# Patient Record
Sex: Male | Born: 1945
Health system: Southern US, Community
[De-identification: ages and names within clinical notes are randomized; demographics above are authoritative.]

## PROBLEM LIST (undated history)

## (undated) DIAGNOSIS — I429 Cardiomyopathy, unspecified: Secondary | ICD-10-CM

## (undated) DIAGNOSIS — K5790 Diverticulosis of intestine, part unspecified, without perforation or abscess without bleeding: Secondary | ICD-10-CM

## (undated) DIAGNOSIS — E785 Hyperlipidemia, unspecified: Secondary | ICD-10-CM

## (undated) DIAGNOSIS — I2510 Atherosclerotic heart disease of native coronary artery without angina pectoris: Secondary | ICD-10-CM

## (undated) DIAGNOSIS — R9431 Abnormal electrocardiogram [ECG] [EKG]: Secondary | ICD-10-CM

## (undated) DIAGNOSIS — I351 Nonrheumatic aortic (valve) insufficiency: Secondary | ICD-10-CM

## (undated) DIAGNOSIS — I5042 Chronic combined systolic (congestive) and diastolic (congestive) heart failure: Secondary | ICD-10-CM

## (undated) DIAGNOSIS — I77810 Thoracic aortic ectasia: Secondary | ICD-10-CM

## (undated) DIAGNOSIS — F17200 Nicotine dependence, unspecified, uncomplicated: Secondary | ICD-10-CM

## (undated) DIAGNOSIS — I428 Other cardiomyopathies: Secondary | ICD-10-CM

## (undated) DIAGNOSIS — E041 Nontoxic single thyroid nodule: Secondary | ICD-10-CM

## (undated) DIAGNOSIS — L989 Disorder of the skin and subcutaneous tissue, unspecified: Secondary | ICD-10-CM

## (undated) DIAGNOSIS — R911 Solitary pulmonary nodule: Secondary | ICD-10-CM

## (undated) DIAGNOSIS — J40 Bronchitis, not specified as acute or chronic: Secondary | ICD-10-CM

## (undated) DIAGNOSIS — F1090 Alcohol use, unspecified, uncomplicated: Secondary | ICD-10-CM

## (undated) DIAGNOSIS — N529 Male erectile dysfunction, unspecified: Secondary | ICD-10-CM

## (undated) DIAGNOSIS — E66811 Obesity, class 1: Secondary | ICD-10-CM

## (undated) DIAGNOSIS — F411 Generalized anxiety disorder: Secondary | ICD-10-CM

## (undated) DIAGNOSIS — H6692 Otitis media, unspecified, left ear: Secondary | ICD-10-CM

## (undated) DIAGNOSIS — M109 Gout, unspecified: Secondary | ICD-10-CM

## (undated) DIAGNOSIS — S39012A Strain of muscle, fascia and tendon of lower back, initial encounter: Secondary | ICD-10-CM

## (undated) DIAGNOSIS — H7292 Unspecified perforation of tympanic membrane, left ear: Secondary | ICD-10-CM

## (undated) DIAGNOSIS — E669 Obesity, unspecified: Secondary | ICD-10-CM

## (undated) DIAGNOSIS — G3184 Mild cognitive impairment, so stated: Secondary | ICD-10-CM

## (undated) DIAGNOSIS — R05 Cough: Secondary | ICD-10-CM

## (undated) DIAGNOSIS — M1990 Unspecified osteoarthritis, unspecified site: Secondary | ICD-10-CM

## (undated) DIAGNOSIS — I447 Left bundle-branch block, unspecified: Secondary | ICD-10-CM

## (undated) DIAGNOSIS — J069 Acute upper respiratory infection, unspecified: Secondary | ICD-10-CM

## (undated) DIAGNOSIS — I10 Essential (primary) hypertension: Secondary | ICD-10-CM

## (undated) DIAGNOSIS — Z9889 Other specified postprocedural states: Secondary | ICD-10-CM

## (undated) DIAGNOSIS — Z7289 Other problems related to lifestyle: Secondary | ICD-10-CM

## (undated) DIAGNOSIS — R011 Cardiac murmur, unspecified: Secondary | ICD-10-CM

## (undated) DIAGNOSIS — I350 Nonrheumatic aortic (valve) stenosis: Secondary | ICD-10-CM

## (undated) DIAGNOSIS — I771 Stricture of artery: Secondary | ICD-10-CM

## (undated) HISTORY — DX: Other specified postprocedural states: Z98.890

## (undated) HISTORY — DX: Solitary pulmonary nodule: R91.1

## (undated) HISTORY — PX: POLYPECTOMY: SHX149

## (undated) HISTORY — DX: Obesity, unspecified: E66.9

## (undated) HISTORY — DX: Hyperlipidemia, unspecified: E78.5

## (undated) HISTORY — DX: Other cardiomyopathies: I42.8

## (undated) HISTORY — DX: Alcohol use, unspecified, uncomplicated: F10.90

## (undated) HISTORY — DX: Nicotine dependence, unspecified, uncomplicated: F17.200

## (undated) HISTORY — DX: Unspecified perforation of tympanic membrane, left ear: H72.92

## (undated) HISTORY — DX: Diverticulosis of intestine, part unspecified, without perforation or abscess without bleeding: K57.90

## (undated) HISTORY — DX: Left bundle-branch block, unspecified: I44.7

## (undated) HISTORY — DX: Abnormal electrocardiogram (ECG) (EKG): R94.31

## (undated) HISTORY — DX: Strain of muscle, fascia and tendon of lower back, initial encounter: S39.012A

## (undated) HISTORY — DX: Cardiac murmur, unspecified: R01.1

## (undated) HISTORY — DX: Essential (primary) hypertension: I10

## (undated) HISTORY — DX: Acute upper respiratory infection, unspecified: J06.9

## (undated) HISTORY — DX: Stricture of artery: I77.1

## (undated) HISTORY — DX: Bronchitis, not specified as acute or chronic: J40

## (undated) HISTORY — DX: Unspecified osteoarthritis, unspecified site: M19.90

## (undated) HISTORY — DX: Generalized anxiety disorder: F41.1

## (undated) HISTORY — DX: Atherosclerotic heart disease of native coronary artery without angina pectoris: I25.10

## (undated) HISTORY — PX: TRANSTHORACIC ECHOCARDIOGRAM: SHX275

## (undated) HISTORY — DX: Disorder of the skin and subcutaneous tissue, unspecified: L98.9

## (undated) HISTORY — PX: COLONOSCOPY: SHX174

## (undated) HISTORY — DX: Chronic combined systolic (congestive) and diastolic (congestive) heart failure: I50.42

## (undated) HISTORY — DX: Thoracic aortic ectasia: I77.810

## (undated) HISTORY — DX: Male erectile dysfunction, unspecified: N52.9

## (undated) HISTORY — DX: Nonrheumatic aortic (valve) stenosis: I35.0

## (undated) HISTORY — DX: Gout, unspecified: M10.9

## (undated) HISTORY — DX: Otitis media, unspecified, left ear: H66.92

## (undated) HISTORY — DX: Obesity, class 1: E66.811

## (undated) HISTORY — DX: Nontoxic single thyroid nodule: E04.1

## (undated) HISTORY — DX: Cough: R05

## (undated) HISTORY — DX: Mild cognitive impairment of uncertain or unknown etiology: G31.84

## (undated) HISTORY — DX: Nonrheumatic aortic (valve) insufficiency: I35.1

## (undated) HISTORY — DX: Other problems related to lifestyle: Z72.89

---
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
## 1997-05-19 ENCOUNTER — Encounter: Payer: Self-pay | Admitting: Family Medicine

## 2002-01-29 ENCOUNTER — Encounter: Payer: Self-pay | Admitting: Family Medicine

## 2002-08-30 ENCOUNTER — Encounter: Payer: Self-pay | Admitting: Family Medicine

## 2002-08-30 LAB — HM COLONOSCOPY

## 2002-10-08 ENCOUNTER — Encounter: Payer: Self-pay | Admitting: Family Medicine

## 2007-10-19 ENCOUNTER — Encounter: Payer: Self-pay | Admitting: Family Medicine

## 2008-02-26 ENCOUNTER — Encounter (INDEPENDENT_AMBULATORY_CARE_PROVIDER_SITE_OTHER): Payer: Self-pay | Admitting: *Deleted

## 2008-02-26 ENCOUNTER — Encounter: Payer: Self-pay | Admitting: Family Medicine

## 2008-02-26 LAB — CONVERTED CEMR LAB
AST: 24 units/L
CO2: 25 meq/L
Calcium: 9.4 mg/dL
Glucose, Bld: 91 mg/dL
Potassium: 4.3 meq/L
Sodium: 139 meq/L

## 2008-05-10 ENCOUNTER — Encounter: Payer: Self-pay | Admitting: Family Medicine

## 2008-05-10 ENCOUNTER — Encounter (INDEPENDENT_AMBULATORY_CARE_PROVIDER_SITE_OTHER): Payer: Self-pay | Admitting: *Deleted

## 2008-05-10 LAB — CONVERTED CEMR LAB
ALT: 36 units/L
AST: 24 units/L
Alkaline Phosphatase: 82 units/L
Cholesterol: 161 mg/dL
HDL: 39 mg/dL
LDL (calc): 101 mg/dL
Total Bilirubin: 0.8 mg/dL
Triglyceride fasting, serum: 105 mg/dL

## 2009-05-10 ENCOUNTER — Encounter: Payer: Self-pay | Admitting: Family Medicine

## 2010-06-12 ENCOUNTER — Telehealth (INDEPENDENT_AMBULATORY_CARE_PROVIDER_SITE_OTHER): Payer: Self-pay | Admitting: *Deleted

## 2010-06-14 ENCOUNTER — Ambulatory Visit: Payer: Self-pay | Admitting: Family Medicine

## 2010-06-14 DIAGNOSIS — F411 Generalized anxiety disorder: Secondary | ICD-10-CM

## 2010-06-27 ENCOUNTER — Encounter (INDEPENDENT_AMBULATORY_CARE_PROVIDER_SITE_OTHER): Payer: Self-pay | Admitting: *Deleted

## 2010-06-27 ENCOUNTER — Encounter: Payer: Self-pay | Admitting: Family Medicine

## 2010-07-19 ENCOUNTER — Ambulatory Visit
Admission: RE | Admit: 2010-07-19 | Discharge: 2010-07-19 | Payer: Self-pay | Source: Home / Self Care | Attending: Family Medicine | Admitting: Family Medicine

## 2010-07-19 ENCOUNTER — Other Ambulatory Visit: Payer: Self-pay | Admitting: Family Medicine

## 2010-07-19 ENCOUNTER — Encounter: Payer: Self-pay | Admitting: Family Medicine

## 2010-07-19 DIAGNOSIS — I10 Essential (primary) hypertension: Secondary | ICD-10-CM

## 2010-07-19 HISTORY — DX: Essential (primary) hypertension: I10

## 2010-07-20 ENCOUNTER — Ambulatory Visit: Admit: 2010-07-20 | Payer: Self-pay | Admitting: Family Medicine

## 2010-07-20 LAB — LIPID PANEL
Cholesterol: 182 mg/dL (ref 0–200)
HDL: 60.9 mg/dL (ref >39.00)
LDL Cholesterol: 107 mg/dL — ABNORMAL HIGH (ref 0–99)
Total CHOL/HDL Ratio: 3
Triglycerides: 71 mg/dL (ref 0.0–149.0)
VLDL: 14.2 mg/dL (ref 0.0–40.0)

## 2010-07-20 LAB — CBC WITH DIFFERENTIAL/PLATELET
Basophils Absolute: 0 10*3/uL (ref 0.0–0.1)
Basophils Relative: 0.8 % (ref 0.0–3.0)
Eosinophils Absolute: 0.2 10*3/uL (ref 0.0–0.7)
Eosinophils Relative: 5.8 % — ABNORMAL HIGH (ref 0.0–5.0)
HCT: 44.6 % (ref 39.0–52.0)
Hemoglobin: 15.5 g/dL (ref 13.0–17.0)
Lymphocytes Relative: 35.2 % (ref 12.0–46.0)
Lymphs Abs: 1.2 10*3/uL (ref 0.7–4.0)
MCHC: 34.7 g/dL (ref 30.0–36.0)
MCV: 93.2 fl (ref 78.0–100.0)
Monocytes Absolute: 0.3 10*3/uL (ref 0.1–1.0)
Monocytes Relative: 8.6 % (ref 3.0–12.0)
Neutro Abs: 1.7 10*3/uL (ref 1.4–7.7)
Neutrophils Relative %: 49.6 % (ref 43.0–77.0)
Platelets: 175 10*3/uL (ref 150.0–400.0)
RBC: 4.79 Mil/uL (ref 4.22–5.81)
RDW: 13.7 % (ref 11.5–14.6)
WBC: 3.5 10*3/uL — ABNORMAL LOW (ref 4.5–10.5)

## 2010-07-20 LAB — HEPATIC FUNCTION PANEL
ALT: 26 U/L (ref 0–53)
AST: 24 U/L (ref 0–37)
Albumin: 3.9 g/dL (ref 3.5–5.2)
Alkaline Phosphatase: 62 U/L (ref 39–117)
Bilirubin, Direct: 0.2 mg/dL (ref 0.0–0.3)
Total Bilirubin: 1.2 mg/dL (ref 0.3–1.2)
Total Protein: 6.6 g/dL (ref 6.0–8.3)

## 2010-07-20 LAB — BASIC METABOLIC PANEL
BUN: 12 mg/dL (ref 6–23)
CO2: 30 mEq/L (ref 19–32)
Calcium: 9.3 mg/dL (ref 8.4–10.5)
Chloride: 104 mEq/L (ref 96–112)
Creatinine, Ser: 0.9 mg/dL (ref 0.4–1.5)
GFR: 92.7 mL/min (ref >60.00)
Glucose, Bld: 94 mg/dL (ref 70–99)
Potassium: 4.6 mEq/L (ref 3.5–5.1)
Sodium: 141 mEq/L (ref 135–145)

## 2010-07-20 LAB — PSA: PSA: 0.95 ng/mL (ref 0.10–4.00)

## 2010-07-20 LAB — TSH: TSH: 2.32 u[IU]/mL (ref 0.35–5.50)

## 2010-07-23 ENCOUNTER — Telehealth (INDEPENDENT_AMBULATORY_CARE_PROVIDER_SITE_OTHER): Payer: Self-pay | Admitting: *Deleted

## 2010-07-27 ENCOUNTER — Encounter: Payer: Self-pay | Admitting: Family Medicine

## 2010-08-02 ENCOUNTER — Ambulatory Visit
Admission: RE | Admit: 2010-08-02 | Discharge: 2010-08-02 | Payer: Self-pay | Source: Home / Self Care | Attending: Family Medicine | Admitting: Family Medicine

## 2010-08-13 ENCOUNTER — Ambulatory Visit
Admission: RE | Admit: 2010-08-13 | Discharge: 2010-08-13 | Payer: Self-pay | Source: Home / Self Care | Attending: Internal Medicine | Admitting: Internal Medicine

## 2010-08-13 DIAGNOSIS — R011 Cardiac murmur, unspecified: Secondary | ICD-10-CM

## 2010-08-13 DIAGNOSIS — F17200 Nicotine dependence, unspecified, uncomplicated: Secondary | ICD-10-CM

## 2010-08-13 HISTORY — DX: Cardiac murmur, unspecified: R01.1

## 2010-08-13 HISTORY — DX: Nicotine dependence, unspecified, uncomplicated: F17.200

## 2010-08-14 NOTE — Progress Notes (Signed)
Summary: Patient questions  Phone Note Call from Patient Call back at Home Phone [phone]   Caller: Spouse Summary of Call: Pls contact pt's wife, she has questions about her husband's care, she would like to know what will be done in his first office visit, I did advise pt that the cost of a new patient visit is normally $280 but that this is only an estimate and that I am not sure what [employer]BS covers Initial call taken by: Lannette Donath,  June 12, 2010 11:53 AM  Follow-up for Phone Call        Spoke with pts wife and she would like to know what all is included in your new pts appts? Pt is concerned about how your going to code this for his insurance? Follow-up by: Josph Macho RMA,  June 12, 2010 12:12 PM  Additional Follow-up for Phone Call Additional follow up Details #1::        This all depends on the patient and their medical problems, complaints, etc.  Tell them that it is not possible for me to predict what I'm going to code for for any given visit b/c it all depends on what the patient's needs are at that visit.  So, if she can give a little info about what the visit is for (for example, does he have a cough, or shoulder pain, or some other problem that needs attention, or is he coming in for a "complete physical"), then I can possibly give them a little better idea of what to expect at the visit, coding/billing, etc.    Additional Follow-up by: Michell Heinrich M.D.,  June 12, 2010 3:24 PM    Additional Follow-up for Phone Call Additional follow up Details #2::    Pts spouse states she understands but was just trying to get an idea of pricing. Follow-up by: Josph Macho RMA,  June 12, 2010 4:06 PM

## 2010-08-14 NOTE — Assessment & Plan Note (Signed)
Summary: New pt est/dt BCBS   Vital Signs:  Patient profile:   65 year old male Height:      65 inches (165.10 cm) Weight:      187.25 pounds (85.11 kg) BMI:     31.27 O2 Sat:      97 % on Room air Temp:     98.0 degrees F (36.67 degrees C) oral Pulse rate:   67 / minute BP sitting:   174 / 83  (right arm) Cuff size:   regular  Vitals Entered By: Josph Macho RMA (June 14, 2010 9:28 AM)  O2 Flow:  Room air CC: Establish new patient/ CF Is Patient Diabetic? No   History of Present Illness: 65 year old white male who is here today to consider being a patient here. He currently sees Dr. Christell Constant at [facility] in [place] and wants to find a younger doctor in anticipation of Dr. Kathi Der eventual retirement.   Has not been to MD in > 80yrs per his report.  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  Takes prozac 10mg  once daily for the last 15 + years "to calm me down".  Denies excessive worry, stress, or history of depression.   Says BP has been up at MD visits and when he give blood at [facility] in the past, but also has normal bp readings sometimes in these settings.  Has never taken BP out of these settings, and has never been dx'd with HTN or given BP med.  Takes no OTC meds regularly, none at all recently. Does not get regular physical exercise.  No cigarettes.  Smoked cigars until 1-2 years ago.  Still chews tobacco.  No alcohol or illegal drugs.  No excessive caffeine. Has never had any surgeries.  Had a colonoscopy > 10 yrs ago, normal per his report today.    Preventive Screening-Counseling & Management  Caffeine-Diet-Exercise     Does Patient Exercise: no      Drug Use:  no.    Current Medications (verified): 1)  Prozac 10 Mg Caps (Fluoxetine Hcl) .... Once Daily  Allergies (verified): 1)  ! Penicillin  Past History:  Past Medical History: Elevated BP without dx of HTN Anxiety NOS Tobacco dependence (chewing)  Family History: NO HTN or  CAD.  Social History: Married, 2 grown children, works as [employer] fast streamer. Alcohol use-no Drug use-no Regular exercise-no Drug Use:  no Does Patient Exercise:  no  Review of Systems  The patient denies anorexia, fever, weight loss, weight gain, vision loss, decreased hearing, hoarseness, chest pain, syncope, dyspnea on exertion, peripheral edema, prolonged cough, headaches, hemoptysis, abdominal pain, melena, hematochezia, severe indigestion/heartburn, hematuria, incontinence, genital sores, muscle weakness, suspicious skin lesions, transient blindness, difficulty walking, depression, unusual weight change, abnormal bleeding, enlarged lymph nodes, angioedema, breast masses, and testicular masses.    Physical Exam  General:  VS: noted, BP repeated --R arm 140/80, L arm 144/84 (by me). Gen: Alert, well appearing, oriented x 4. Neck: supple.  No lymphadenopathy, thyromegaly, or mass.  Carotid pulses 2+ bilat, no bruits. Chest: symmetric expansion, with nonlabored respirations.  Clear and equal breath sounds in all lung fields.   CV: RRR, 1/6 systolic murmur at base.  S1 and S2 distinct.  Peripheral pulses 2+/symmetric. ABD: soft, NT, ND, BS normal.  No hepatospenomegaly or mass.  No bruits. EXT: no clubbing, cyanosis, or edema.     Impression & Recommendations:  Problem # 1:  ELEVATED BP READING WITHOUT DX HYPERTENSION (  ICD-796.2) New patient, here to consider establishing care with me, which I think he has decided to do. I recommended we follow his BP out of the office---I offered to give him a BP cuff that was donated by a pharmaceutical company, but he declined at this time.  He agreed to try to make an effort to get BP checked at [facility] or a pharmacy and write it down and bring it to return appt.  I'll gather old records (including an EKG he says he had about 2 yrs ago or so) and decide at f/u if repeat needed. At f/u, will do fasting lipids, PSA, CMET.  I did recommend he get a  repeat colonoscopy as per screening guidelines---he'll call his GI MD's office or call us if our referral is needed. He declined my offer of flu vaccine today.  I did recommend he start taking one 81mg  ASA daily and try to lower his sodium intake and increase intake of fruits and vegetables, increase physical activity. F/u 59mo.  Complete Medication List: 1)  Prozac 10 Mg Caps (Fluoxetine hcl) .... Once daily  Patient Instructions: 1)  Please schedule a follow-up appointment in 1 month.  2)  Check your blood pressure at [facility] or a pharmacy whevever possible and write this number down.    Orders Added: 1)  New Patient Level III [99203]    Preventive Care Screening  Last Tetanus Booster:    Date:  07/16/2007    Results:  Historical    Appended Document: New pt est/dt BCBS Faxed medical record request 12.1.11 Diane Tomerlin

## 2010-08-16 ENCOUNTER — Ambulatory Visit: Admit: 2010-08-16 | Payer: Self-pay | Admitting: Family Medicine

## 2010-08-16 ENCOUNTER — Encounter: Payer: Self-pay | Admitting: Family Medicine

## 2010-08-16 ENCOUNTER — Ambulatory Visit: Payer: BC Managed Care – PPO | Admitting: Family Medicine

## 2010-08-16 DIAGNOSIS — I10 Essential (primary) hypertension: Secondary | ICD-10-CM

## 2010-08-16 NOTE — Assessment & Plan Note (Signed)
Summary: 1 month fu/dt   Vital Signs:  Patient profile:   65 year old male Height:      65 inches Weight:      187.75 pounds BMI:     31.36 Pulse rate:   61 / minute Pulse rhythm:   regular BP sitting:   171 / 82  (right arm) Cuff size:   regular  Vitals Entered By: Francee Piccolo CMA Duncan Dull) (July 19, 2010 9:16 AM) CC: BP Check/no other complaints//SP Is Patient Diabetic? No   History of Present Illness:  Hypertension follow-up--he has not checked his BP since our last visit.  Feeling good.      This is a 65 year old man who presents for Hypertension follow-up.  The patient denies lightheadedness, urinary frequency, headaches, edema, impotence, rash, and fatigue.  The patient denies the following associated symptoms: chest pain, chest pressure, dyspnea, palpitations, and leg edema.  The patient reports that dietary compliance has been good.  Adjunctive measures currently used by the patient include salt restriction.   At this time is not on medications yet.  Current Medications (verified): 1)  Prozac 10 Mg Caps (Fluoxetine Hcl) .... Once Daily  Allergies (verified): 1)  ! Penicillin  Past History:  Past medical, surgical, family and social histories (including risk factors) reviewed, and no changes noted (except as noted below).  Past Medical History: Reviewed history from 06/27/2010 and no changes required. Elevated BP without dx of HTN Anxiety NOS Tobacco dependence (chewing) Diverticulosis (TCS 2004-Dr. Arlyce Dice) ETT 2004 [facility]--neg L-spine osteoarthritis  Family History: Reviewed history from 06/27/2010 and no changes required. Mother: Alzheimer's dementia Father: COPD, CHF Brother: congenital heart disease/cardiolyopathy--d. age 51  Social History: Reviewed history from 06/14/2010 and no changes required. 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 Alcohol use-no Drug use-no Regular exercise-no  Review of Systems       see HPI.  Also: no  urinary frequency, straining, incomplete emptying, dribbling, or nocturia.  Physical Exam  General:  VS: noted, hypertensive, HR low 60s. Gen: Alert, well appearing, oriented x 4. Chest: symmetric expansion, with nonlabored respirations.  Clear and equal breath sounds in all lung fields.   CV: RRR, distant S1 and S2, question of soft systolic murmur heard best at apex.  Peripheral pulses 2+/symmetric. EXT: no clubbing, cyanosis, or edema.  DRE: normal rectal tone, no mass.  Stool wipings hemoccult negative today. Prostate size normal, without palpable nodule or induration.  No tenderness.   Impression & Recommendations:  Problem # 1:  ESSENTIAL HYPERTENSION (ICD-401.9) Assessment New Start HCTZ 25mg  once daily.  Continue low sodium diet. Check lipids, CMET, TSH, CBC.   His updated medication list for this problem includes:    Hydrochlorothiazide 25 Mg Tabs (Hydrochlorothiazide) .Marland Kitchen... 1 tab by mouth qd  Orders: EKG w/ Interpretation (93000) TLB-Lipid Panel (80061-LIPID) TLB-BMP (Basic Metabolic Panel-BMET) (80048-METABOL) TLB-Hepatic/Liver Function Pnl (80076-HEPATIC) TLB-CBC Platelet - w/Differential (85025-CBCD) TLB-TSH (Thyroid Stimulating Hormone) (82956-OZH) Cardiology Referral (Cardiology)  Problem # 2:  ABNORMAL ELECTROCARDIOGRAM (ICD-794.31) Assessment: New  EKG changes reflect LVH and possible anterolateral ischemia. Needs further cardiac evaluation, will refer ASAP. Risks vs benefits of starting aspirin was rediscussed today with pt, and I will have him start ASA 81mg  once daily.  Orders: Cardiology Referral (Cardiology)  Problem # 3:  SPECIAL SCREENING MALIGNANT NEOPLASM OF PROSTATE (ICD-V76.44) Assessment: New DRE normal today.   Orders: TLB-PSA (Prostate Specific Antigen) (84153-PSA)  Complete Medication List: 1)  Prozac 10 Mg Caps (Fluoxetine hcl) .... Once daily  2)  Hydrochlorothiazide 25 Mg Tabs (Hydrochlorothiazide) .Marland Kitchen.. 1 tab by mouth qd  Patient  Instructions: 1)  Please schedule a follow-up appointment in 2 weeks. 2)  Take lab orders to [facility] on [address]. Prescriptions: HYDROCHLOROTHIAZIDE 25 MG TABS (HYDROCHLOROTHIAZIDE) 1 tab by mouth qd  #30 x 6   Entered and Authorized by:   Michell Heinrich M.D.   Signed by:   Michell Heinrich M.D. on 07/19/2010   Method used:   Print then Give to Patient   RxID:   [id number]    Orders Added: 1)  EKG w/ Interpretation [93000] 2)  TLB-Lipid Panel [80061-LIPID] 3)  TLB-BMP (Basic Metabolic Panel-BMET) [80048-METABOL] 4)  TLB-Hepatic/Liver Function Pnl [80076-HEPATIC] 5)  TLB-CBC Platelet - w/Differential [85025-CBCD] 6)  TLB-TSH (Thyroid Stimulating Hormone) [84443-TSH] 7)  Est. Patient Level III [95284] 8)  Cardiology Referral [Cardiology] 9)  TLB-PSA (Prostate Specific Antigen) [13244-WNU]

## 2010-08-16 NOTE — Miscellaneous (Signed)
Summary: Lab results  Clinical Lists Changes  Observations: Added new observation of BILI TOTAL: 0.8 mg/dL (16/04/9603 54:09) Added new observation of SGOT (AST): 24 units/L (05/10/2008 13:47) Added new observation of SGPT (ALT): 36 units/L (05/10/2008 13:47) Added new observation of ALK PHOS: 82 units/L (05/10/2008 13:47) Added new observation of TRIGLYCERIDE: 105 mg/dL (81/19/1478 29:56) Added new observation of HDL: 39 mg/dL (21/30/8657 84:69) Added new observation of LDL (CALCUL): 101 mg/dL (62/95/2841 32:44) Added new observation of CHOLESTEROL: 161 mg/dL (07/17/7251 66:44) Added new observation of ALBUMIN: 4.6 g/dL (03/47/4259 56:38) Added new observation of PROTEIN, TOT: 6.8 g/dL (75/64/3329 51:88) Added new observation of BILI TOTAL: 0.7 mg/dL (41/66/0630 16:01) Added new observation of SGPT (ALT): 24 units/L (02/26/2008 13:50) Added new observation of SGOT (AST): 24 units/L (02/26/2008 13:50) Added new observation of CALCIUM: 9.4 mg/dL (09/32/3557 32:20) Added new observation of CO2 PLSM/SER: 25 meq/L (02/26/2008 13:49) Added new observation of CL SERUM: 99 meq/L (02/26/2008 13:49) Added new observation of K SERUM: 4.3 meq/L (02/26/2008 13:49) Added new observation of NA: 139 meq/L (02/26/2008 13:49) Added new observation of CREATININE: 0.80 mg/dL (25/42/7062 37:62) Added new observation of BUN: 13 mg/dL (83/15/1761 60:73) Added new observation of BG RANDOM: 91 mg/dL (71/12/2692 85:46) Added new observation of COLONOSCOPY: Colonoscopy: Diverticulosis  (08/30/2002 13:51)      Colonoscopy  Procedure date:  08/30/2002  Findings:      Colonoscopy: Diverticulosis  -  Date:  05/10/2008    Cholesterol: 161    LDL-calculated: 101    HDL: 39    Triglycerides: 270    Alk Phos: 82    SGPT (ALT): 36    SGOT (AST): 24    Bilirubin-total: 0.8    T. Bilirubin: 0.8  Date:  02/26/2008    SGPT (ALT): 24    SGOT (AST): 24    Bilirubin-total: 0.7    BG Random: 91    BUN:  13    Creatinine: 0.80    Sodium: 139    Potassium: 4.3    Chloride: 99    CO2 Total: 25    Calcium: 9.4    T. Bilirubin: 0.7    Total Protein: 6.8    Albumin: 4.6

## 2010-08-16 NOTE — Procedures (Signed)
Summary: Colon/[facility]  Colon/[facility]   Imported By: Lester O'Donnell 07/02/2010 11:39:39  _____________________________________________________________________  External Attachment:    Type:   Image     Comment:   External Document

## 2010-08-16 NOTE — Assessment & Plan Note (Signed)
Summary: 2 week fu/dt   Vital Signs:  Patient profile:   65 year old male Height:      65 inches Weight:      188.50 pounds BMI:     31.48 Pulse rate:   57 / minute BP sitting:   169 / 81  (right arm) Cuff size:   regular  Vitals Entered By: Francee Piccolo CMA Duncan Dull) (August 02, 2010 9:31 AM) CC: BP check, pt is taking meds and has decreased dietary sodium, pt is not checking BP at home Is Patient Diabetic? No   History of Present Illness:  Hypertension follow-up      This is a 65 year old man who presents for Hypertension follow-up.  He started HCTZ 25mg  qAM, denies any side effects.  No BPs to report---doesn't have home bp cuff yet.  The patient denies lightheadedness, urinary frequency, headaches, edema, impotence, rash, and fatigue.  Compliance with medications (by patient report) has been near 100%.  Adjunctive measures currently used by the patient include salt restriction.   He has cardiology consult appt set for 08/13/10.  Current Medications (verified): 1)  Prozac 10 Mg Caps (Fluoxetine Hcl) .... Once Daily 2)  Hydrochlorothiazide 25 Mg Tabs (Hydrochlorothiazide) .Marland Kitchen.. 1 Tab By Mouth Qd  Allergies (verified): 1)  ! Penicillin  Past History:  Past medical, surgical, family and social histories (including risk factors) reviewed, and no changes noted (except as noted below).  Past Medical History: Reviewed history from 06/27/2010 and no changes required. Elevated BP without dx of HTN Anxiety NOS Tobacco dependence (chewing) Diverticulosis (TCS 2004-Dr. Arlyce Dice) ETT 2004 [facility]--neg L-spine osteoarthritis  Family History: Reviewed history from 06/27/2010 and no changes required. Mother: Alzheimer's dementia Father: COPD, CHF Brother: congenital heart disease/cardiolyopathy--d. age 70  Social History: Reviewed history from 06/14/2010 and no changes required. 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 Alcohol use-no Drug use-no Regular  exercise-no  Physical Exam  General:  VS: noted: bp 169/81P 57 Gen: Alert, well appearing, oriented x 4. No further exam.   Impression & Recommendations:  Problem # 1:  ESSENTIAL HYPERTENSION (ICD-401.9) Assessment Unchanged Continue hctz and add lisinopril generic 20mg , 1/2 tab once daily to start, go to whole once daily if bp not at goal in 2 wks---f/u 2 wks. No asa yet--will start this when bp in better control. We reviewed all his labs from earlier this month in detail today: all normal.  His updated medication list for this problem includes:    Hydrochlorothiazide 25 Mg Tabs (Hydrochlorothiazide) .Marland Kitchen... 1 tab by mouth qd    Lisinopril 20 Mg Tabs (Lisinopril) .Marland Kitchen... 1/2-1 tab by mouth once daily  Complete Medication List: 1)  Prozac 10 Mg Caps (Fluoxetine hcl) .... Once daily 2)  Hydrochlorothiazide 25 Mg Tabs (Hydrochlorothiazide) .Marland Kitchen.. 1 tab by mouth qd 3)  Lisinopril 20 Mg Tabs (Lisinopril) .... 1/2-1 tab by mouth once daily  Patient Instructions: 1)  Follow up in 2 wks. 2)  Consider getting a home BP cuff.  Your goal BP is <130/80. Prescriptions: LISINOPRIL 20 MG TABS (LISINOPRIL) 1/2-1 tab by mouth once daily  #30 x 1   Entered and Authorized by:   Michell Heinrich M.D.   Signed by:   Michell Heinrich M.D. on 08/02/2010   Method used:   Print then Give to Patient   RxID:   [id number]    Orders Added: 1)  Est. Patient Level III [10272]

## 2010-08-16 NOTE — Progress Notes (Signed)
Summary: Lab results  Phone Note Other Incoming   Summary of Call: Please notify: all blood work is normal.   Initial call taken by: Michell Heinrich M.D.,  July 23, 2010 10:13 AM  Follow-up for Phone Call        message left on home answering machine with above information. Follow-up by: Francee Piccolo CMA Duncan Dull),  July 23, 2010 4:16 PM

## 2010-08-16 NOTE — Miscellaneous (Signed)
  Clinical Lists Changes  Observations: Added new observation of CHD 65YR RSK: Not enough information (06/27/2010 8:17) Added new observation of CARD RSK GRP: B (06/27/2010 8:17) Added new observation of HPI: Old records received and reviewed, chart updated today. Of note, he did have some isolated systolic bp readings elevated intermittently over the last decade (150s-160s), and an EKG in 1998 that showed anteroseptal infarct and minimal voltage criteria for LVH. Also, in 1998, he had a CXR that showed mild hilar prominence, and f/u CT was normal at that time. (06/27/2010 8:17) Added new observation of PAST MED HX: Elevated BP without dx of HTN Anxiety NOS Tobacco dependence (chewing) Diverticulosis (TCS 2004-Dr. Arlyce Dice) ETT 2004 [facility]--neg L-spine osteoarthritis (06/27/2010 8:17) Added new observation of FAMILY HX: Mother: Alzheimer's dementia Father: COPD, CHF Brother: congenital heart disease/cardiolyopathy--d. age 76 (06/27/2010 8:17)      History of Present Illness: Old records received and reviewed, chart updated today. Of note, he did have some isolated systolic bp readings elevated intermittently over the last decade (150s-160s), and an EKG in 1998 that showed anteroseptal infarct and minimal voltage criteria for LVH. Also, in 1998, he had a CXR that showed mild hilar prominence, and f/u CT was normal at that time.   Past History:  Past Medical History: Elevated BP without dx of HTN Anxiety NOS Tobacco dependence (chewing) Diverticulosis (TCS 2004-Dr. Arlyce Dice) ETT 2004 [facility]--neg L-spine osteoarthritis   History of Present Illness: Old records received and reviewed, chart updated today. Of note, he did have some isolated systolic bp readings elevated intermittently over the last decade (150s-160s), and an EKG in 1998 that showed anteroseptal infarct and minimal voltage criteria for LVH. Also, in 1998, he had a CXR that showed mild hilar prominence, and f/u CT was  normal at that time.    Family History: Mother: Alzheimer's dementia Father: COPD, CHF Brother: congenital heart disease/cardiolyopathy--d. age 36

## 2010-08-22 ENCOUNTER — Other Ambulatory Visit (HOSPITAL_COMMUNITY): Payer: BC Managed Care – PPO

## 2010-08-22 NOTE — Assessment & Plan Note (Addendum)
Summary: np6/abnormal ekg-mb   CC:  referal from Dr. Milinda Cave abnormal EKG.  History of Present Illness: patient is a 65 year old who was referred for evaluation of abnormal EKG The patient hs no known history of CAD.  He had a stress test in 2004 that was negative for ischemia.  He also has a history of hypertension The patient denies chest pains.  No SOB.  He is fairly active.   Current Medications (verified): 1)  Prozac 10 Mg Caps (Fluoxetine Hcl) .... Once Daily 2)  Hydrochlorothiazide 25 Mg Tabs (Hydrochlorothiazide) .Marland Kitchen.. 1 Tab By Mouth Qd 3)  Lisinopril 20 Mg Tabs (Lisinopril) .... 1/2-1 Tab By Mouth Once Daily  Allergies: 1)  ! Penicillin  Past History:  Family History: Last updated: 06/27/2010 Mother: Alzheimer's dementia Father: COPD, CHF Brother: congenital heart disease/cardiolyopathy--d. 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 Alcohol use-no Drug use-no Regular exercise-no Chews tobacco.  Past Medical History:  HTN Anxiety NOS Tobacco dependence (chewing) Diverticulosis (TCS 2004-Dr. 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 Alcohol use-no Drug use-no Regular exercise-no Chews tobacco.  Review of Systems       All systems reviewed.  Neg to the above problem except as noted above.  Vital Signs:  Patient profile:   65 year old male Height:      65 inches Weight:      185 pounds BMI:     30.90 Pulse rate:   64 / minute Resp:     12 per minute BP sitting:   140 / 74  (left arm)  Vitals Entered By: Kem Parkinson (August 13, 2010 10:09 AM)  Physical Exam  Additional Exam:  Patient is in NAD HEENT:  Normocephalic, atraumatic. EOMI, PERRLA.  Neck: JVP is normal. No thyromegaly. No bruits.  Lungs: clear to auscultation. No rales no wheezes.  Heart: Regular rate and rhythm. Normal S1, S2. No S3.    I-II/Vi systolic murmur. PMI not displaced.  Abdomen:  Supple, nontender. Normal bowel sounds. No masses. No hepatomegaly.  Extremities:   Good distal pulses throughout. No lower extremity edema.  Musculoskeletal :moving all extremities.  Neuro:   alert and oriented x3.    EKG  Procedure date:  07/19/2010  Findings:      NSR.  64 bpm.  First degree AV block.  LVH.  ST T wave vchanges consistent with early repolarization.  Cannot exclude ischemia.  Note these are different than previous ECG.  Impression & Recommendations:  Problem # 1:  ABNORMAL ELECTROCARDIOGRAM (ICD-794.31) Patients BP suggest LVH with strain.  More prominent than prior. I have told him that it is nondiagnositc for ischemia Patient has risks factors for CAD but denies symtoms. To start I would recomm an echo to evaluate LV thickness and LV function and valve function. If not that impressive would recommend stress test to further evaluate.  Problem # 2:  ESSENTIAL HYPERTENSION (ICD-401.9) Continue Rx. His updated medication list for this problem includes:    Hydrochlorothiazide 25 Mg Tabs (Hydrochlorothiazide) .Marland Kitchen... 1 tab by mouth qd    Lisinopril 20 Mg Tabs (Lisinopril) .Marland Kitchen... 1/2-1 tab by mouth once daily  Problem # 3:  TOBACCO ABUSE (ICD-305.1) Counselled against chewing.  Other Orders: Echocardiogram (Echo)  Patient Instructions: 1)  Your physician has requested that you have an echocardiogram.  Echocardiography is a painless test that uses sound waves to create images of your  heart. It provides your doctor with information about the size and shape of your heart and how well your heart's chambers and valves are working.  This procedure takes approximately one hour. There are no restrictions for this procedure. we will call you with results.    Appended Document: np6/abnormal ekg-mb LMOM and asked was echo ever done?  Appended Document: np6/abnormal ekg-mb Patient had echo done on 09/13/2010.  Echo was  abnormal with mild to moderate LV dysfunction and regaional wall motion abnormalities.  I have recommended cardiact cath to define anatomy.  Patient understood.  Agreed to proceed.

## 2010-08-22 NOTE — Miscellaneous (Signed)
  Clinical Lists Changes  Medications: Changed medication from LISINOPRIL 20 MG TABS (LISINOPRIL) 1/2-1 tab by mouth once daily to LISINOPRIL 20 MG TABS (LISINOPRIL) 1/2 tab by mouth once daily

## 2010-08-22 NOTE — Assessment & Plan Note (Signed)
Summary: 2 week f/u vfw   Vital Signs:  Patient profile:   65 year old male Height:      65 inches Weight:      187 pounds BMI:     31.23 Pulse rate:   68 / minute BP sitting:   128 / 76  (left arm) Cuff size:   regular  Vitals Entered By: Francee Piccolo CMA Duncan Dull) (August 16, 2010 9:51 AM) CC: follow-up visit BP Is Patient Diabetic? No   History of Present Illness:  Hypertension follow-up      This is a 65 year old man who presents for Hypertension follow-up.  Has been taking 25mg  hctz qd and 1/2 of a 20mg  lisinopril tab qd and feels well.  He does have a slight tickle-type cough that is more noticeable at night when he lies down.  Says it is NOT a problematic/bothersome thing.  Denies heartburn/reflux.  No nasal congestion/mucous.   The patient denies lightheadedness, urinary frequency, headaches, edema, impotence, rash, and fatigue.  The patient denies the following associated symptoms: chest pain, chest pressure, exercise intolerance, dyspnea, palpitations, syncope, leg edema, and pedal edema.  Compliance with medications (by patient report) has been near 100%.  The patient reports that dietary compliance has been good.  Adjunctive measures currently used by the patient include salt restriction.   He saw Dr. Tenny Craw at [facility] cardiology and she plans on getting an echo on him.  Current Medications (verified): 1)  Prozac 10 Mg Caps (Fluoxetine Hcl) .... Once Daily 2)  Hydrochlorothiazide 25 Mg Tabs (Hydrochlorothiazide) .Marland Kitchen.. 1 Tab By Mouth Qd 3)  Lisinopril 20 Mg Tabs (Lisinopril) .... 1/2-1 Tab By Mouth Once Daily  Allergies (verified): 1)  ! Penicillin  Past History:  Past Medical History: Reviewed history from 08/13/2010 and no changes required.  HTN Anxiety NOS Tobacco dependence (chewing) Diverticulosis (TCS 2004-Dr. Arlyce Dice) ETT 2004 [facility]--neg L-spine osteoarthritis  Past Surgical History: NONE  Family History: Reviewed history from 06/27/2010 and no  changes required. Mother: Alzheimer's dementia Father: COPD, CHF Brother: congenital heart disease/cardiolyopathy--d. age 20  Social History: Reviewed history from 08/13/2010 and no changes required. 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 Alcohol use-no Drug use-no Regular exercise-no Chews tobacco.  Review of Systems       see HPI  Physical Exam  General:  VS: noted, all normal. Gen: Alert, well appearing, oriented x 4. Neck: supple.  No lymphadenopathy, thyromegaly, or mass. Chest: symmetric expansion, with nonlabored respirations.  Clear and equal breath sounds in all lung fields.   CV: RRR, soft systolic murmur (unchanged) without rub or gallop.  Peripheral pulses 2+/symmetric. EXT: no clubbing, cyanosis, or edema.     Impression & Recommendations:  Problem # 1:  ESSENTIAL HYPERTENSION (ICD-401.9) Assessment Improved Doing well.  His slight cough is likely from his ACE-I but is not bothersome to him so we'll not change anything at this time. Continue current meds and DASH diet, encouraged increase in physical activity.  Add 81mg  aspirin once daily. Will f/u echo results. Patient declined my offer/recommendation of flu vaccine today. Routine f/u in 6 months.    His updated medication list for this problem includes:    Hydrochlorothiazide 25 Mg Tabs (Hydrochlorothiazide) .Marland Kitchen... 1 tab by mouth qd    Lisinopril 20 Mg Tabs (Lisinopril) .Marland Kitchen... 1/2-1 tab by mouth once daily  Complete Medication List: 1)  Prozac 10 Mg Caps (Fluoxetine hcl) .... Once daily 2)  Hydrochlorothiazide 25 Mg Tabs (Hydrochlorothiazide) .Marland Kitchen.. 1 tab by  mouth qd 3)  Lisinopril 20 Mg Tabs (Lisinopril) .... 1/2-1 tab by mouth once daily  Patient Instructions: 1)  Start one 81mg  aspirin once daily. 2)  Continue your current medications as well.

## 2010-09-05 ENCOUNTER — Telehealth: Payer: Self-pay | Admitting: Internal Medicine

## 2010-09-06 ENCOUNTER — Other Ambulatory Visit (HOSPITAL_COMMUNITY): Payer: BC Managed Care – PPO

## 2010-09-11 NOTE — Progress Notes (Addendum)
Summary: staus of echo/checking on insurance  Phone Note Call from Patient Call back at Home Phone [phone]   Caller: Spouse- janice- Reason for Call: Talk to Nurse Summary of Call: pt wants to know why he haven't heard anything regarding set up appt for echo. Initial call taken by: Lorne Skeens,  September 05, 2010 9:43 AM  Follow-up for Phone Call        Spoke with wife-wants to know if insurance will pay for Echo.  No Insurance on file.  States patient has BCBS.  Will check and put order for Echo in today. Follow-up by: Dessie Coma  LPN,  September 05, 2010 10:19 AM  Additional Follow-up for Phone Call Additional follow up Details #1::        pt has to work today and is going to come on 09/13/10 at 7:30am for Echo. Wife aware.  If we have a cancelation prior will call her and move up. Merita Norton will notify if we change and move echo up Dennis Bast, RN, BSN  September 05, 2010 10:49 AM

## 2010-09-13 ENCOUNTER — Ambulatory Visit (HOSPITAL_COMMUNITY): Payer: BC Managed Care – PPO | Attending: Cardiology

## 2010-09-13 DIAGNOSIS — I059 Rheumatic mitral valve disease, unspecified: Secondary | ICD-10-CM | POA: Insufficient documentation

## 2010-09-13 DIAGNOSIS — R9431 Abnormal electrocardiogram [ECG] [EKG]: Secondary | ICD-10-CM

## 2010-09-13 DIAGNOSIS — I10 Essential (primary) hypertension: Secondary | ICD-10-CM | POA: Insufficient documentation

## 2010-09-13 DIAGNOSIS — F17200 Nicotine dependence, unspecified, uncomplicated: Secondary | ICD-10-CM | POA: Insufficient documentation

## 2010-09-13 DIAGNOSIS — I079 Rheumatic tricuspid valve disease, unspecified: Secondary | ICD-10-CM | POA: Insufficient documentation

## 2010-09-13 DIAGNOSIS — I379 Nonrheumatic pulmonary valve disorder, unspecified: Secondary | ICD-10-CM | POA: Insufficient documentation

## 2010-09-28 ENCOUNTER — Telehealth: Payer: Self-pay | Admitting: Family Medicine

## 2010-10-01 ENCOUNTER — Telehealth: Payer: Self-pay | Admitting: Internal Medicine

## 2010-10-01 ENCOUNTER — Encounter: Payer: Self-pay | Admitting: Internal Medicine

## 2010-10-05 ENCOUNTER — Other Ambulatory Visit (INDEPENDENT_AMBULATORY_CARE_PROVIDER_SITE_OTHER): Payer: BC Managed Care – PPO | Admitting: *Deleted

## 2010-10-05 DIAGNOSIS — Z01810 Encounter for preprocedural cardiovascular examination: Secondary | ICD-10-CM

## 2010-10-05 LAB — BASIC METABOLIC PANEL
BUN: 10 mg/dL (ref 6–23)
CO2: 29 mEq/L (ref 19–32)
Calcium: 8.8 mg/dL (ref 8.4–10.5)
GFR: 89.12 mL/min (ref >60.00)
Glucose, Bld: 96 mg/dL (ref 70–99)
Potassium: 4.4 mEq/L (ref 3.5–5.1)

## 2010-10-05 LAB — CBC WITH DIFFERENTIAL/PLATELET
Basophils Absolute: 0 10*3/uL (ref 0.0–0.1)
Eosinophils Absolute: 0.2 10*3/uL (ref 0.0–0.7)
HCT: 40.9 % (ref 39.0–52.0)
Lymphs Abs: 1.3 10*3/uL (ref 0.7–4.0)
MCHC: 35.3 g/dL (ref 30.0–36.0)
Monocytes Relative: 9.4 % (ref 3.0–12.0)
Neutro Abs: 1.7 10*3/uL (ref 1.4–7.7)
Platelets: 188 10*3/uL (ref 150.0–400.0)
RDW: 14 % (ref 11.5–14.6)

## 2010-10-05 LAB — APTT: aPTT: 26 s (ref 21.7–28.8)

## 2010-10-11 NOTE — Letter (Signed)
Summary: Cardiac Catheterization Instructions- [facility]  [facility], Main Office  [address]   [place], [place] [address]   Phone: [phone]  Fax: [phone]     10/01/2010 MRN: [id number]  Anthony Daugherty [address] [place], [place]  [address]  [place]  Dear [name],   You are scheduled for a Cardiac Catheterization on ___3/30/12______ with Dr.__BENSIMHON____________  Please arrive to the 1st floor of the Heart and Vascular Center at [facility] at _9:30____ am  on the day of your procedure. Please do not arrive before 6:30 a.m. Call the Heart and Vascular Center at [phone] if you are unable to make your appointmnet. The Code to get into the parking garage under the building is___3000_____. Take the elevators to the 1st floor. You must have someone to drive you home. Someone must be with you for the first 24 hours after you arrive home. Please wear clothes that are easy to get on and off and wear slip-on shoes. Do not eat or drink after midnight except water with your medications that morning. Bring all your medications and current insurance cards with you.     __X_ Make sure you take your aspirin.  __X_ You may take ALL of your medications with water that morning. ________________________________________________________________________________________________________________________________   The usual length of stay after your procedure is 2 to 3 hours. This can vary.  If you have any questions, please call the office at the number listed above.   Scherrie Bateman, LPN

## 2010-10-11 NOTE — Progress Notes (Signed)
Summary: set up cath  Phone Note Call from Patient Call back at Home Phone [phone]   Caller: Patient Reason for Call: Talk to Nurse Summary of Call: pt wants to schedule his cath.  Initial call taken by: Lorne Skeens,  October 01, 2010 8:52 AM  Follow-up for Phone Call        Phone Call Completed CATH SET UP FOR 10/12/10  PER JACKIE REISS RN  LABS SCHEDULED FOR 10/05/10. INSTRUCTIONS DISCUSSED WITH WIFE . Follow-up by: Scherrie Bateman, LPN,  October 01, 2010 9:13 AM

## 2010-10-11 NOTE — Progress Notes (Signed)
Summary: Emergency plan  Phone Note Call from Patient   Caller: Spouse Call For: Michell Heinrich M.D. Summary of Call: Patient wife wants to know what they should do in case of an emergency with her husband. In the off hours or during the weekend.  Initial call taken by: Georga Bora,  September 28, 2010 9:18 AM  Follow-up for Phone Call        If truly having an emergency, call 911 or go to the nearest emergency department/hospital. If not sure or if pt has questions and it is outside of office hours, then call our office---tell them to listen to the message b/c it will tell them how to contact our after hours nursing service.  Tell them that the nursing service will either help them or will page the doctor on call for assistance.   Follow-up by: Michell Heinrich M.D.,  September 28, 2010 10:34 AM  Additional Follow-up for Phone Call Additional follow up Details #1::        Left a message for the patient's wife to give me a call back in referance to her note she left on Friday.   Additional Follow-up by: Georga Bora,  October 02, 2010 3:14 PM    Additional Follow-up for Phone Call Additional follow up Details #2::    Talk with Mrs. Fluegge about the emergency plan Dr. Milinda Cave outlined. Follow-up by: Georga Bora,  October 03, 2010 1:48 PM

## 2010-10-12 ENCOUNTER — Encounter: Payer: Self-pay | Admitting: Family Medicine

## 2010-10-12 ENCOUNTER — Ambulatory Visit (HOSPITAL_COMMUNITY)
Admission: RE | Admit: 2010-10-12 | Payer: BC Managed Care – PPO | Source: Ambulatory Visit | Admitting: Internal Medicine

## 2010-10-12 ENCOUNTER — Inpatient Hospital Stay (HOSPITAL_BASED_OUTPATIENT_CLINIC_OR_DEPARTMENT_OTHER)
Admission: RE | Admit: 2010-10-12 | Discharge: 2010-10-12 | Disposition: A | Discharge status: Home / Self Care | Payer: BC Managed Care – PPO | Source: Ambulatory Visit | Attending: Internal Medicine | Admitting: Internal Medicine

## 2010-10-12 DIAGNOSIS — I2510 Atherosclerotic heart disease of native coronary artery without angina pectoris: Secondary | ICD-10-CM

## 2010-10-12 DIAGNOSIS — F17200 Nicotine dependence, unspecified, uncomplicated: Secondary | ICD-10-CM | POA: Insufficient documentation

## 2010-10-12 DIAGNOSIS — I350 Nonrheumatic aortic (valve) stenosis: Secondary | ICD-10-CM

## 2010-10-12 DIAGNOSIS — R9431 Abnormal electrocardiogram [ECG] [EKG]: Secondary | ICD-10-CM | POA: Insufficient documentation

## 2010-10-12 DIAGNOSIS — I10 Essential (primary) hypertension: Secondary | ICD-10-CM | POA: Insufficient documentation

## 2010-10-12 DIAGNOSIS — I429 Cardiomyopathy, unspecified: Secondary | ICD-10-CM

## 2010-10-12 DIAGNOSIS — I359 Nonrheumatic aortic valve disorder, unspecified: Secondary | ICD-10-CM | POA: Insufficient documentation

## 2010-10-12 HISTORY — PX: CARDIAC CATHETERIZATION: SHX172

## 2010-10-12 HISTORY — DX: Nonrheumatic aortic (valve) stenosis: I35.0

## 2010-10-12 HISTORY — DX: Cardiomyopathy, unspecified: I42.9

## 2010-10-16 ENCOUNTER — Encounter: Payer: Self-pay | Admitting: Family Medicine

## 2010-10-16 DIAGNOSIS — I428 Other cardiomyopathies: Secondary | ICD-10-CM

## 2010-10-16 DIAGNOSIS — I429 Cardiomyopathy, unspecified: Secondary | ICD-10-CM | POA: Insufficient documentation

## 2010-10-26 ENCOUNTER — Encounter: Payer: Self-pay | Admitting: Family Medicine

## 2010-10-26 ENCOUNTER — Ambulatory Visit (INDEPENDENT_AMBULATORY_CARE_PROVIDER_SITE_OTHER): Payer: BC Managed Care – PPO | Admitting: Family Medicine

## 2010-10-26 DIAGNOSIS — I428 Other cardiomyopathies: Secondary | ICD-10-CM

## 2010-10-26 DIAGNOSIS — I10 Essential (primary) hypertension: Secondary | ICD-10-CM

## 2010-10-26 NOTE — Patient Instructions (Signed)
Your blood pressure goal is 130/80.   Check your BP once daily for the next 2 wks and record the numbers and bring them with you to your f/u appt in 2 wks. Start taking aspirin 81mg  once daily.

## 2010-10-26 NOTE — Assessment & Plan Note (Signed)
BP up a bit initially here today but normal on recheck.  He'll get a bp cuff so he can check bp at home daily for the next 2 wks and I'll see him in office in 2 wks to review. Goal bp 130/80.

## 2010-10-26 NOTE — Progress Notes (Signed)
OFFICE NOTE  10/26/2010  CC:  Chief Complaint  Patient presents with  . Follow-up    heart cath     HPI:   Patient is a 65 y.o. Caucasian male who is here for f/u of HTN and nonischemic CM. He got his cath 3/30--see results discussion below.  Cardiologist started him on coreg and lipitor.  He continues to take HCTZ and lisinopril.  He has not started his ASA yet.  He feels well.  Denies CP, SOB, palpitations, or dizziness.  He recently walked up and down some hills with his wife and felt fine during and after. He is still monitoring his sodium intake.  No regular/daily exercise as of yet. He does say he has mild cold feeling in hands and feet, plus occasional loose stool and feels somewhat tired since starting the lipitor and coreg.  He also still has the tickle in throat/cough since starting his ACE-I.  He says none of these side effects bother him too much.  Past Medical History  Diagnosis Date  . Elevated blood-pressure reading without diagnosis of hypertension   . Anxiety   . Diverticulosis   . Arthritis     L-Spine  . Hypertension   . Cardiomyopathy, nonischemic 10/12/10    Global hypokinesis, EF 40-45% by cath and echo  . Single vessel coronary artery disease 10/12/10    80% OM lesion, med mgmt per cards  . Aortic stenosis, mild 10/12/10    cath and echo  . CARDIAC MURMUR 08/13/2010  . ABNORMAL ELECTROCARDIOGRAM 07/19/2010    MEDS;   Outpatient Prescriptions Prior to Visit  Medication Sig Dispense Refill  . FLUoxetine (PROZAC) 10 MG capsule Take 10 mg by mouth daily.        . hydrochlorothiazide 25 MG tablet Take 25 mg by mouth daily.        Coreg 3.125 bid Lisinopril 1/2 20mg  tab qd Lipitor 20 mg qd  Allergies  Allergen Reactions  . Penicillins      PE:  Blood pressure 138/72, pulse 55, height 5\' 5"  (1.651 m), weight 187 lb (84.823 kg). Gen: Alert, well appearing.  Patient is oriented to person, place, time, and situation. NECK: no TM.  Carotids 2+ bilat and  without bruit. CV: RRR, 2/6 systolic murmur, without rub or gallop.   LUNGS: CTA bilat, nonlabored. EXT: no c/c/e     IMPRESSION AND PLAN:  Cardiomyopathy, nonischemic Echo and cath 2012 showed EF 40-45% with global hypokinesis.  Cath 10/12/10 showed mild nonobstructive CAD diffusely, with one vessel occlusive disease (80% in OM) that was not intervened upon.  Medical mgmt recommended by cardiology. BP good, HR 55.  No titration of meds done today.  I recommended he go ahead and start his 81mg  ASA qd now.   ESSENTIAL HYPERTENSION BP up a bit initially here today but normal on recheck.  He'll get a bp cuff so he can check bp at home daily for the next 2 wks and I'll see him in office in 2 wks to review. Goal bp 130/80.    FOLLOW UP:  Return in about 2 weeks (around 11/09/2010) for HTN follow up.

## 2010-10-26 NOTE — Assessment & Plan Note (Signed)
Echo and cath 2012 showed EF 40-45% with global hypokinesis.  Cath 10/12/10 showed mild nonobstructive CAD diffusely, with one vessel occlusive disease (80% in OM) that was not intervened upon.  Medical mgmt recommended by cardiology. BP good, HR 55.  No titration of meds done today.  I recommended he go ahead and start his 81mg  ASA qd now.

## 2010-10-30 ENCOUNTER — Encounter: Payer: Self-pay | Admitting: Family Medicine

## 2010-11-13 ENCOUNTER — Encounter: Payer: Self-pay | Admitting: Internal Medicine

## 2010-11-13 NOTE — Cardiovascular Report (Signed)
NAME:  Anthony Daugherty, Anthony Daugherty           ACCOUNT NO.:  [id number]  MEDICAL RECORD NO.:  [id number]           PATIENT TYPE:  LOCATION:                                 FACILITY:  PHYSICIAN:  Bevelyn Buckles. Keylie Beavers, MDDATE OF BIRTH:  1946-03-30  DATE OF PROCEDURE:  10/12/2010 DATE OF DISCHARGE:                           CARDIAC CATHETERIZATION   PATIENT IDENTIFICATION:  Anthony Daugherty is a very pleasant 65 year old male with a history of hypertension and chewing tobacco use.  Denies any history of known coronary artery disease.  He was referred by Dr. Milinda Cave to see Dr. Tenny Craw due to abnormal EKG.  Dr. Tenny Craw evaluated him and felt his EKG was secondary to LVH with repolarization abnormalities. However, I referred him for an echocardiogram that showed ejection fraction of 40-45% with mild aortic stenosis, so he is referred for cardiac catheterization to rule out underlying ischemic heart disease.  PROCEDURES PERFORMED: 1. Selective coronary angiography. 2. Left heart catheterization. 3. Left ventriculogram.  DESCRIPTION OF PROCEDURE:  The risks and indications were explained. Consent was signed and placed on chart.  A 4-French arterial sheath was placed in the right femoral artery using a modified Seldinger technique. Standard catheters including JL-4, 3-DRC, and angled pigtail were used. Of note, we had significant difficulty getting the catheter across the aortic valve.  We finally were able to cross using the pigtail and a straight wire.  Central aortic pressure 115/57 with a mean of 79.  LV pressure 118/8 with an EDP of 12.  There is evidence of mild aortic stenosis with mean gradient across the aortic valve of 12 on pullback.  Left main was diffusely calcified, but no significant obstruction.  LAD coursed to the apex that gave off a large diagonal.  There was diffuse 30% stenosis which was calcified proximally and mild to moderate plaquing in the mid-to-distal vessel with about 20%  luminal narrowing. In the ostium of the first diagonal, there was a 50% stenosis.  Left circumflex gave off a ramus branch, a large OM-1, and a posterolateral.  There was diffuse 20% calcified plaquing in the proximal portion followed by 30% lesion in the AV groove circ at the takeoff of the OM-1.  In the proximal portion of the OM-1, there was a focal 80% lesion with TIMI 3 flow.  Right coronary artery is a moderate-sized vessel that gave off PDA, had diffuse 30% calcific plaquing proximally.  Left ventriculogram done in the RAO position.  EF was a bit difficult to ascertain due to ectopy.  However, ejection fraction appeared to be at 40-45% with global hypokinesis.  ASSESSMENT: 1. Coronary artery disease that is mostly nonobstructive, however,     there is an 80% lesion in the first obtuse marginal artery.     Otherwise, there is significant calcification, but no high-grade     stenosis. 2. Mild to moderate left ventricular dysfunction with an ejection     fraction of 40-45%, global hypokinesis. 3. Mild aortic stenosis.  PLAN/DISCUSSION:  Overall, he appears to have a mild to moderate nonischemic cardiomyopathy with ejection fraction of 40-45%.  He does have 80% lesion in the OM-1, however, he has TIMI  3 flow through it and he is asymptomatic, so I think this can be managed medically.  For now, I would focus on aggressive titration of ACE inhibitor and hopefully beta-blocker as tolerated.  We will continue to follow him.  If he develops ischemic-type symptoms, can consider intervention on the OM-1, but otherwise we will treat medically.  I have discussed this with Dr. Tenny Craw.     Bevelyn Buckles. Olivier Frayre, MD     DRB/MEDQ  D:  10/12/2010  T:  10/13/2010  Job:  [id number]  cc:   Pricilla Riffle, MD, [facility] Jeoffrey Massed, MD  Electronically Signed by Arvilla Meres MD on 11/13/2010 07:10:32 PM

## 2010-11-15 ENCOUNTER — Encounter: Payer: Self-pay | Admitting: Family Medicine

## 2010-11-15 ENCOUNTER — Ambulatory Visit (INDEPENDENT_AMBULATORY_CARE_PROVIDER_SITE_OTHER): Payer: BC Managed Care – PPO | Admitting: Family Medicine

## 2010-11-15 VITALS — BP 159/74 | HR 58 | Ht 65.0 in | Wt 184.0 lb

## 2010-11-15 DIAGNOSIS — I10 Essential (primary) hypertension: Secondary | ICD-10-CM

## 2010-11-15 DIAGNOSIS — Z1211 Encounter for screening for malignant neoplasm of colon: Secondary | ICD-10-CM

## 2010-11-15 MED ORDER — LISINOPRIL 40 MG PO TABS: 40.0000 mg | ORAL_TABLET | Freq: Every day | ORAL | Status: DC

## 2010-11-15 NOTE — Progress Notes (Signed)
OFFICE NOTE  11/15/2010  CC:  Chief Complaint  Patient presents with  . Hypertension    follow up, brought home BP cuff     HPI:   Patient is a 65 y.o. Caucasian male who is here for f/u HTN. Feels well, compliant with meds except hasn't started ASA 81mg  qd yet--forgot. Home BP measurements reviewed today (4 measurements): consistently 160s systolic, 70s-90s diastolic. Denies CP, palpitations, SOB, or edema.  Pertinent PMH:  HTN, nonischemic cardiomyopathy, anxiety  MEDS;   Outpatient Prescriptions Prior to Visit  Medication Sig Dispense Refill  . atorvastatin (LIPITOR) 20 MG tablet Take 20 mg by mouth daily.        . carvedilol (COREG) 3.125 MG tablet Take 3.125 mg by mouth 2 (two) times daily with a meal.        . FLUoxetine (PROZAC) 10 MG capsule Take 10 mg by mouth daily.        . hydrochlorothiazide 25 MG tablet Take 25 mg by mouth daily.        Marland Kitchen lisinopril (PRINIVIL,ZESTRIL) 20 MG tablet Take 1/2-1 tablet by mouth once daily as directed.         PE: Blood pressure 159/74, pulse 58, height 5\' 5"  (1.651 m), weight 184 lb (83.462 kg). Gen: Alert, well appearing.  Patient is oriented to person, place, time, and situation. Chest: symmetric expansion, nonlabored respirations.  Clear and equal breath sounds in all lung fields.   CV: RRR, soft systolic murmur with distinct S1 and S2.  Peripheral pulses 2+ and symmetric. EXT: no clubbing, cyanosis, or edema.    IMPRESSION AND PLAN:  ESSENTIAL HYPERTENSION Not well controlled.  Increase lisinopril to 40mg  qd.  Continue current dosing of other meds.  Add ASA 81 mg qd. He has cardiology f/u tomorrow. I reviewed and printed out 2g/day sodium diet for him. F/u 18mo, earlier if BPs consistently >160 syst or 100 diast.  Colon cancer screening Not due for colonoscopy until 2014, but we'll do his annual hemoccult testing now--I gave kit for hemoccult (IFOB) testing at home today.     FOLLOW UP:  Return in about 1 month  (around 12/16/2010).

## 2010-11-15 NOTE — Assessment & Plan Note (Signed)
Not due for colonoscopy until 2014, but we'll do his annual hemoccult testing now--I gave kit for hemoccult (IFOB) testing at home today.

## 2010-11-15 NOTE — Patient Instructions (Addendum)
2 Gram Low Sodium Diet A 2 gram sodium diet restricts the amount of sodium in the diet to no more than 2 grams (g) or 2000 milligrams (mg) daily. Limiting the amount of sodium is often used to help lower blood pressure. It is important if you have heart, liver, or kidney problems. Many foods contain sodium for flavor and sometimes as a preservative. When the amount of sodium in a diet needs to be low, it is important to know what to look for when choosing foods and drinks. The following includes some information and guidelines to help make it easier for you to adapt to a low sodium diet. QUICK TIPS  Do not add salt to food.   Avoid convenience items and fast food.   Choose unsalted snack foods.   Buy lower sodium products, often labeled as "lower sodium" or "no salt added."   Check food labels to learn how much sodium is in 1 serving.   When eating at a restaurant, ask that your food be prepared with less salt or none, if possible.  READING FOOD LABELS FOR SODIUM INFORMATION The nutrition facts label is a good place to find how much sodium is in foods. Look for products with no more than 500 to 600 mg of sodium per meal and no more than 150 mg per serving. Remember that 2 g = 2000 mg. The food label may also list foods as:  Sodium-free: Less than 5 mg in a serving.   Very low sodium: 35 mg or less in a serving.   Low-sodium: 140 mg or less in a serving.   Light in sodium: 50% less sodium in a serving. For example, if a food that usually has 300 mg of sodium is changed to become light in sodium, it will have 150 mg of sodium.   Reduced sodium: 25% less sodium in a serving. For example, if a food that usually has 400 mg of sodium is changed to reduced sodium, it will have 300 mg of sodium.  CHOOSING FOODS AVOID CHOOSE  Grains: Salted crackers and snack items. Some cereals, including instant hot cereals. Bread stuffing and biscuit mixes. Seasoned rice or pasta mixes. Grains: Unsalted  snack items. Low-sodium cereals, oats, puffed wheat and rice, shredded wheat. English muffins and bread. Pasta.  Meats: Salted, canned, smoked, spiced, or pickled meats, including fish and poultry. Bacon, ham, sausage, cold cuts, hot dogs, anchovies. Meats: Low-sodium canned tuna and salmon. Fresh or frozen meat, poultry, and fish.  Dairy: Processed cheese and spreads. Cottage cheese. Buttermilk and condensed milk. Regular cheese. Dairy: Milk. Low-sodium cottage cheese. Yogurt. Sour cream. Low-sodium cheese.  Fruits and Vegetables: Regular canned vegetables. Regular canned tomato sauce and paste. Frozen vegetables in sauces. Olives. Rosita Fire. Relishes. Sauerkraut. Fruits and Vegetables: Low-sodium canned vegetables. Low-sodium tomato sauce and paste. Fresh and frozen vegetables. Fresh and frozen fruit.  Condiments: Canned and packaged gravies. Worcestershire sauce. Tartar sauce. Barbecue sauce. Soy sauce. Steak sauce. Ketchup. Onion, garlic, and table salt. Meat flavorings and tenderizers. Condiments: Fresh and dried herbs and spices. Low-sodium varieties of mustard and ketchup. Lemon juice. Tabasco sauce. Horseradish.  SAMPLE 2 GRAM SODIUM MEAL PLAN Meal Foods Sodium (mg)  Breakfast 1 cup low-fat milk 2 slices whole-wheat toast 1 tablespoon heart-healthy margarine 1 hard-boiled egg 1 small orange [phone]  Lunch 1 cup raw carrots  cup hummus 1 cup low-fat milk  cup red grapes 1 whole-wheat pita bread 76 [phone]  Dinner  1 cup whole-wheat pasta 1 cup low-sodium tomato sauce 3 ounces lean ground beef 1 small side salad (1 cup raw spinach leaves,  cup cucumber,  cup yellow bell pepper) with 1 teaspoon olive oil and 1 teaspoon red wine vinegar 2 73 57 25  Snack 1 container low-fat vanilla yogurt 3 graham cracker squares 107 127  Nutrient Analysis Calories: 2033 Protein: 77 g Carbohydrate: 282 g Fat: 72 g Sodium: 1971  mg Document Released: 07/01/2005 Document Re-Released: 12/19/2009 ExitCare Patient Information 2011 Exit[employer], [employer]

## 2010-11-15 NOTE — Assessment & Plan Note (Signed)
Not well controlled.  Increase lisinopril to 40mg  qd.  Continue current dosing of other meds.  Add ASA 81 mg qd. He has cardiology f/u tomorrow. I reviewed and printed out 2g/day sodium diet for him. F/u 83mo, earlier if BPs consistently >160 syst or 100 diast.

## 2010-11-15 NOTE — Progress Notes (Signed)
Addended by: Baldemar Lenis on: 11/15/2010 09:09 AM   Modules accepted: Orders

## 2010-11-16 ENCOUNTER — Ambulatory Visit (INDEPENDENT_AMBULATORY_CARE_PROVIDER_SITE_OTHER): Payer: BC Managed Care – PPO | Admitting: Internal Medicine

## 2010-11-16 ENCOUNTER — Encounter: Payer: Self-pay | Admitting: Internal Medicine

## 2010-11-16 VITALS — BP 124/62 | HR 59 | Ht 65.0 in | Wt 184.0 lb

## 2010-11-16 DIAGNOSIS — I10 Essential (primary) hypertension: Secondary | ICD-10-CM

## 2010-11-16 DIAGNOSIS — I2510 Atherosclerotic heart disease of native coronary artery without angina pectoris: Secondary | ICD-10-CM | POA: Insufficient documentation

## 2010-11-16 DIAGNOSIS — E785 Hyperlipidemia, unspecified: Secondary | ICD-10-CM

## 2010-11-16 DIAGNOSIS — I428 Other cardiomyopathies: Secondary | ICD-10-CM

## 2010-11-16 HISTORY — DX: Atherosclerotic heart disease of native coronary artery without angina pectoris: I25.10

## 2010-11-16 NOTE — Patient Instructions (Signed)
Your physician wants you to follow-up in MAY 2013 You will receive a reminder letter in the mail two months in advance. If you don't receive a letter, please call our office to schedule the follow-up appointment.

## 2010-11-16 NOTE — Assessment & Plan Note (Signed)
Tolerating meds.

## 2010-11-16 NOTE — Assessment & Plan Note (Signed)
Patient now on medical Rx.  Tolerating them.  Continue.  Add ASA

## 2010-11-16 NOTE — Progress Notes (Signed)
HPI Patient is a 65 year old who returns for f/u.  I saw him in clinic at the end of January.  He was referred for abnormal EKG.  I sched an echo that showed an LVEF of 40 to 45%.  Because of this I recomm he have a cardiac cath to define his anatomy.  This was done at the beginning of March.  He had mild CAD.  He was seen by D. Bensimohn and his medications were adjusted. Since seen he has done well.  He denies chest pains  No signif SOB.  Notes occasional cough. Allergies  Allergen Reactions  . Penicillins     Current Outpatient Prescriptions  Medication Sig Dispense Refill  . atorvastatin (LIPITOR) 20 MG tablet Take 20 mg by mouth daily.        . carvedilol (COREG) 3.125 MG tablet Take 3.125 mg by mouth 2 (two) times daily with a meal.        . FLUoxetine (PROZAC) 10 MG capsule Take 10 mg by mouth daily.        . hydrochlorothiazide 25 MG tablet Take 25 mg by mouth daily.        Marland Kitchen lisinopril (PRINIVIL,ZESTRIL) 40 MG tablet Take 1 tablet (40 mg total) by mouth daily.  30 tablet  11    Past Medical History  Diagnosis Date  . Elevated blood-pressure reading without diagnosis of hypertension   . Anxiety   . Diverticulosis   . Arthritis     L-Spine  . Hypertension   . Cardiomyopathy, nonischemic 10/12/10    Global hypokinesis, EF 40-45% by cath and echo  . Single vessel coronary artery disease 10/12/10    80% OM lesion, med mgmt per cards  . Aortic stenosis, mild 10/12/10    cath and echo  . CARDIAC MURMUR 08/13/2010  . ABNORMAL ELECTROCARDIOGRAM 07/19/2010  . Hx of colonoscopy     Past Surgical History  Procedure Date  . Cardiac catheterization 10/12/10    diagnostic only: 1 vessel CAD with TIMI 3 flow, EF 40-45%.  Mild AS.  . Transthoracic echocardiogram 09/2010    Family History  Problem Relation Age of Onset  . Alzheimer's disease Mother   . COPD Father   . Heart failure Father     congestive  . Other Father     CHF  . Congenital heart disease Brother     cardiomyopathy    . Other Brother     CHF/ cardiolyopathy    History   Social History  . Marital Status: Married    Spouse Name: N/A    Number of Children: 2  . Years of Education: N/A   Occupational History  . Delivery Driver    Social History Main Topics  . Smoking status: Former Games developer  . Smokeless tobacco: Current User    Types: Chew   Comment: chews tobacco  . Alcohol Use: No  . Drug Use: No  . Sexually Active: Yes   Other Topics Concern  . Not on file   Social History Narrative   Deliverty driver.  Married, two children.    Review of Systems:  All systems reviewed.  They are negative to the above problem except as previously stated.  Vital Signs: BP 124/62  Pulse 59  Ht 5\' 5"  (1.651 m)  Wt 184 lb (83.462 kg)  BMI 30.62 kg/m2  Physical Exam  HEENT:  Normocephalic, atraumatic. EOMI, PERRLA.  Neck: JVP is normal. No thyromegaly. No bruits.  Lungs: clear to  auscultation. No rales no wheezes.  Heart: Regular rate and rhythm. Normal S1, S2. No S3.   No significant murmurs. PMI not displaced.  Abdomen:  Supple, nontender. Normal bowel sounds. No masses. No hepatomegaly.  Extremities:   Good distal pulses throughout. No lower extremity edema.  Musculoskeletal :moving all extremities.  Neuro:   alert and oriented x3.  CN II-XII grossly intact.  EKG:  Sinus bradycardia.  59 bpm.  First degree AV block.  LVH with repol abnormality, cannot exclude ischemia    Assessment and Plan:

## 2010-11-19 ENCOUNTER — Other Ambulatory Visit: Payer: BC Managed Care – PPO

## 2010-12-20 ENCOUNTER — Ambulatory Visit: Payer: BC Managed Care – PPO | Admitting: Family Medicine

## 2010-12-21 ENCOUNTER — Encounter: Payer: Self-pay | Admitting: Family Medicine

## 2010-12-21 ENCOUNTER — Ambulatory Visit (INDEPENDENT_AMBULATORY_CARE_PROVIDER_SITE_OTHER): Payer: BC Managed Care – PPO | Admitting: Family Medicine

## 2010-12-21 DIAGNOSIS — I10 Essential (primary) hypertension: Secondary | ICD-10-CM

## 2010-12-21 DIAGNOSIS — E785 Hyperlipidemia, unspecified: Secondary | ICD-10-CM

## 2010-12-21 DIAGNOSIS — I428 Other cardiomyopathies: Secondary | ICD-10-CM

## 2010-12-21 NOTE — Assessment & Plan Note (Signed)
Lab Results  Component Value Date   CHOL 182 07/19/2010   CHOL 161 05/10/2008   Lab Results  Component Value Date   HDL 60.90 07/19/2010   HDL 39 05/10/2008   Lab Results  Component Value Date   LDLCALC 107* 07/19/2010   LDLCALC 101 05/10/2008   Lab Results  Component Value Date   TRIG 71.0 07/19/2010   Lab Results  Component Value Date   CHOLHDL 3 07/19/2010   No results found for this basename: LDLDIRECT   He is set for repeating these at the cardiologists later this month.

## 2010-12-21 NOTE — Assessment & Plan Note (Signed)
Problem stable.  Continue current medications and diet appropriate for this condition.  We have reviewed our general long term plan for this problem and also reviewed symptoms and signs that should prompt the patient to call or return to the office.  

## 2010-12-21 NOTE — Assessment & Plan Note (Signed)
Problem stable.  Continue current medications and diet appropriate for this condition.  We have reviewed our general long term plan for this problem and also reviewed symptoms and signs that should prompt the patient to call or return to the office. Reviewed cardiology note from 11/2010.  No changes were made.  They'll be rechecking his lipid panel later this month per pt's report. No mention of any plan for f/u echo.

## 2010-12-21 NOTE — Progress Notes (Signed)
OFFICE NOTE  12/21/2010  CC:  Chief Complaint  Patient presents with  . Follow-up    1 month follow up     HPI:   Patient is a 65 y.o. Caucasian male who is here for f/u HTN and nonischemic cardiomyopathy and hyperlipidemia. Feels well.  Has no physical limitations.  No CP, no SOB, no palpitations, no dizziness. Regarding colon cancer screening, he had normal colonoscopy 2003 per his report and will be due for repeat next year. I've given him hemoccults to do but he says he hasn't done them yet but plans on doing them soon. He asks about carotid artery doppler screening, says he heard something on the news and is curious.   Denies any hx of CVA or TIA type sx's.  Pertinent PMH:  HTN Nonischemic CM--Dr. Lucina Mellow following CAD  [facility];   Outpatient Prescriptions Prior to Visit  Medication Sig Dispense Refill  . atorvastatin (LIPITOR) 20 MG tablet Take 20 mg by mouth daily.        . carvedilol (COREG) 3.125 MG tablet Take 3.125 mg by mouth 2 (two) times daily with a meal.        . FLUoxetine (PROZAC) 10 MG capsule Take 10 mg by mouth daily.        . hydrochlorothiazide 25 MG tablet Take 25 mg by mouth daily.        Marland Kitchen lisinopril (PRINIVIL,ZESTRIL) 40 MG tablet Take 1 tablet (40 mg total) by mouth daily.  30 tablet  11    PE: Blood pressure 136/67, pulse 52, temperature 98 F (36.7 C), temperature source Oral, height 5\' 5"  (1.651 m), weight 189 lb 6.4 oz (85.911 kg), SpO2 99.00%. Gen: Alert, well appearing.  Patient is oriented to person, place, time, and situation. Neck: supple, ROM full.  Carotids 2+ bilat, without bruit.  No lymphadenopathy, thyromegaly, or mass.   IMPRESSION AND PLAN:  Cardiomyopathy, nonischemic Problem stable.  Continue current medications and diet appropriate for this condition.  We have reviewed our general long term plan for this problem and also reviewed symptoms and signs that should prompt the patient to call or return to the office. Reviewed cardiology  note from 11/2010.  No changes were made.  They'll be rechecking his lipid panel later this month per pt's report. No mention of any plan for f/u echo.  Dyslipidemia Lab Results  Component Value Date   CHOL 182 07/19/2010   CHOL 161 05/10/2008   Lab Results  Component Value Date   HDL 60.90 07/19/2010   HDL 39 05/10/2008   Lab Results  Component Value Date   LDLCALC 107* 07/19/2010   LDLCALC 101 05/10/2008   Lab Results  Component Value Date   TRIG 71.0 07/19/2010   Lab Results  Component Value Date   CHOLHDL 3 07/19/2010   No results found for this basename: LDLDIRECT   He is set for repeating these at the cardiologists later this month.  ESSENTIAL HYPERTENSION Problem stable.  Continue current medications and diet appropriate for this condition.  We have reviewed our general long term plan for this problem and also reviewed symptoms and signs that should prompt the patient to call or return to the office.    Colon cancer screening: due for repeat colonoscopy 2013.  Hemoccult cards have been given and pt will do these.  FOLLOW UP:  Return in about 6 months (around 06/22/2011).

## 2011-01-10 ENCOUNTER — Other Ambulatory Visit (INDEPENDENT_AMBULATORY_CARE_PROVIDER_SITE_OTHER): Payer: BC Managed Care – PPO

## 2011-01-10 DIAGNOSIS — I2510 Atherosclerotic heart disease of native coronary artery without angina pectoris: Secondary | ICD-10-CM

## 2011-01-10 DIAGNOSIS — I509 Heart failure, unspecified: Secondary | ICD-10-CM

## 2011-01-10 LAB — BASIC METABOLIC PANEL
BUN: 19 mg/dL (ref 6–23)
CO2: 26 mEq/L (ref 19–32)
Chloride: 104 mEq/L (ref 96–112)
Glucose, Bld: 133 mg/dL — ABNORMAL HIGH (ref 70–99)
Potassium: 4 mEq/L (ref 3.5–5.1)

## 2011-01-10 LAB — LIPID PANEL
Cholesterol: 147 mg/dL (ref 0–200)
VLDL: 16.4 mg/dL (ref 0.0–40.0)

## 2011-02-01 ENCOUNTER — Encounter: Payer: Self-pay | Admitting: Family Medicine

## 2011-02-01 ENCOUNTER — Ambulatory Visit (INDEPENDENT_AMBULATORY_CARE_PROVIDER_SITE_OTHER): Payer: BC Managed Care – PPO | Admitting: Family Medicine

## 2011-02-01 VITALS — BP 157/77 | HR 58 | Temp 98.5°F | Ht 65.0 in | Wt 186.0 lb

## 2011-02-01 DIAGNOSIS — J40 Bronchitis, not specified as acute or chronic: Secondary | ICD-10-CM

## 2011-02-01 DIAGNOSIS — J209 Acute bronchitis, unspecified: Secondary | ICD-10-CM

## 2011-02-01 DIAGNOSIS — I10 Essential (primary) hypertension: Secondary | ICD-10-CM

## 2011-02-01 MED ORDER — GUAIFENESIN ER 600 MG PO TB12: 1200.0000 mg | ORAL_TABLET | Freq: Two times a day (BID) | ORAL | Status: DC

## 2011-02-01 MED ORDER — SULFAMETHOXAZOLE-TMP DS 800-160 MG PO TABS: 1.0000 | ORAL_TABLET | Freq: Two times a day (BID) | ORAL | Status: AC

## 2011-02-01 MED ORDER — BENZONATATE 100 MG PO CAPS: 100.0000 mg | ORAL_CAPSULE | Freq: Two times a day (BID) | ORAL | Status: AC | PRN

## 2011-02-01 MED ORDER — ALBUTEROL SULFATE HFA 108 (90 BASE) MCG/ACT IN AERS: 2.0000 | INHALATION_SPRAY | Freq: Four times a day (QID) | RESPIRATORY_TRACT | Status: DC | PRN

## 2011-02-01 NOTE — Patient Instructions (Signed)
Bronchitis Bronchitis is the body's way of reacting to injury and/or infection (inflammation) of the bronchi. Bronchi are the air tubes that extend from the windpipe into the lungs. If the inflammation becomes severe, it may cause shortness of breath.  CAUSES Inflammation may be caused by:  A virus.   Germs (bacteria).   Dust.   Allergens.   Pollutants and many other irritants.  The cells lining the bronchial tree are covered with tiny hairs (cilia). These constantly beat upward, away from the lungs, toward the mouth. This keeps the lungs free of pollutants. When these cells become too irritated and are unable to do their job, mucus begins to develop. This causes the characteristic cough of bronchitis. The cough clears the lungs when the cilia are unable to do their job. Without either of these protective mechanisms, the mucus would settle in the lungs. Then you would develop pneumonia. Smoking is a common cause of bronchitis and can contribute to pneumonia. Stopping this habit is the single most important thing you can do to help yourself. TREATMENT  Your caregiver may prescribe an antibiotic if the cough is caused by bacteria. Also, medicines that open up your airways make it easier to breathe. Your caregiver may also recommend or prescribe an expectorant. It will loosen the mucus to be coughed up. Only take over-the-counter or prescription medicines for pain, discomfort, or fever as directed by your caregiver.   Removing whatever causes the problem (smoking, for example) is critical to preventing the problem from getting worse.   Cough suppressants may be prescribed for relief of cough symptoms.   Inhaled medicines may be prescribed to help with symptoms now and to help prevent problems from returning.   For those with recurrent (chronic) bronchitis, there may be a need for steroid medicines.  SEEK IMMEDIATE MEDICAL CARE IF:  During treatment, you develop more pus-like mucus  (purulent sputum).   You or your child has an oral temperature above 104, not controlled by medicine.   Your baby is older than 3 months with a rectal temperature of 102 F (38.9 C) or higher.   Your baby is 3 months old or younger with a rectal temperature of 100.4 F (38 C) or higher.   You become progressively more ill.   You have increased difficulty breathing, wheezing, or shortness of breath.  It is necessary to seek immediate medical care if you are elderly or sick from any other disease. MAKE SURE YOU:  Understand these instructions.   Will watch your condition.   Will get help right away if you are not doing well or get worse.  Document Released: 07/01/2005 Document Re-Released: 09/25/2009 [employer] Patient Information 2011 [employer]. 

## 2011-02-05 ENCOUNTER — Encounter: Payer: Self-pay | Admitting: Family Medicine

## 2011-02-05 DIAGNOSIS — J40 Bronchitis, not specified as acute or chronic: Secondary | ICD-10-CM | POA: Insufficient documentation

## 2011-02-05 NOTE — Assessment & Plan Note (Signed)
Mild elevation today with acute illness, no changes to medications at this time.

## 2011-02-05 NOTE — Progress Notes (Signed)
Anthony Daugherty [id number] Aug 04, 1945 02/05/2011      Progress Note-Follow Up  Subjective  Chief Complaint  Chief Complaint  Patient presents with  . Cough    chest congestion also, wheezing, coughed long enough he feels like he is going to pass out, fell once after coughing spell    HPI  Patient is a 65 year old Caucasian male who is in today with 3 weeks of worsening congestion. He reports initially he was troubled with rhinorrhea mostly yellow and some facial pressure and over time it is moved more into his chest this is more congested he is wheezing and coughing more and struggling with some pleuritis and some right sided discomfort when he coughs now. He does continue to smoke an occasional cigar still. He is coughing intensely enough several times if he is almost fell presyncopal. He has not had any falls or palpitations. No obvious high-grade fevers or chills. No GI concerns or urinary concerns at this time. He has not taken any significant medications for this thus far.  Past Medical History  Diagnosis Date  . Elevated blood-pressure reading without diagnosis of hypertension   . Anxiety   . Diverticulosis   . Arthritis     L-Spine  . Hypertension   . Cardiomyopathy, nonischemic 10/12/10    Global hypokinesis, EF 40-45% by cath and echo  . Single vessel coronary artery disease 10/12/10    80% OM lesion, med mgmt per cards  . Aortic stenosis, mild 10/12/10    cath and echo  . CARDIAC MURMUR 08/13/2010  . ABNORMAL ELECTROCARDIOGRAM 07/19/2010  . Hx of colonoscopy 2002    normal per patient  . TOBACCO ABUSE 08/13/2010  . Bronchitis 02/05/2011    Past Surgical History  Procedure Date  . Cardiac catheterization 10/12/10    diagnostic only: 1 vessel CAD with TIMI 3 flow, EF 40-45%.  Mild AS.  . Transthoracic echocardiogram 09/2010    Family History  Problem Relation Age of Onset  . Alzheimer's disease Mother   . COPD Father   . Heart failure Father     congestive  .  Other Father     CHF  . Congenital heart disease Brother     cardiomyopathy  . Other Brother     CHF/ cardiolyopathy    History   Social History  . Marital Status: Married    Spouse Name: Liborio Nixon    Number of Children: 2  . Years of Education: N/A   Occupational History  . Delivery Driver    Social History Main Topics  . Smoking status: Former Games developer  . Smokeless tobacco: Current User    Types: Chew   Comment: chews tobacco  . Alcohol Use: Yes  . Drug Use: No  . Sexually Active: Yes   Other Topics Concern  . Not on file   Social History Narrative   Deliverty driver.  Married, two children.    Current Outpatient Prescriptions on File Prior to Visit  Medication Sig Dispense Refill  . atorvastatin (LIPITOR) 20 MG tablet Take 20 mg by mouth daily.        . carvedilol (COREG) 3.125 MG tablet Take 3.125 mg by mouth 2 (two) times daily with a meal.        . FLUoxetine (PROZAC) 10 MG capsule Take 10 mg by mouth daily.        . hydrochlorothiazide 25 MG tablet Take 25 mg by mouth daily.        Marland Kitchen lisinopril (PRINIVIL,ZESTRIL) 40  MG tablet Take 1 tablet (40 mg total) by mouth daily.  30 tablet  11    Allergies  Allergen Reactions  . Penicillins     Review of Systems  Review of Systems  Constitutional: Negative for fever, chills and malaise/fatigue.  HENT: Positive for congestion. Negative for ear pain and sore throat.   Eyes: Negative for discharge.  Respiratory: Positive for cough, sputum production, shortness of breath and wheezing.   Cardiovascular: Positive for chest pain. Negative for palpitations and leg swelling.       CP with vigorous coughing  Gastrointestinal: Negative for nausea, vomiting, abdominal pain and diarrhea.  Genitourinary: Negative for dysuria.  Musculoskeletal: Negative for falls.  Skin: Negative for rash.  Neurological: Negative for loss of consciousness and headaches.  Endo/Heme/Allergies: Negative for polydipsia.  Psychiatric/Behavioral:  Negative for depression and suicidal ideas. The patient is not nervous/anxious and does not have insomnia.     Objective Physical Exam  Constitutional: He is oriented to person, place, and time and well-developed, well-nourished, and in no distress. No distress.  HENT:  Head: Normocephalic and atraumatic.  Eyes: Conjunctivae are normal.  Neck: Neck supple. No thyromegaly present.  Cardiovascular: Normal rate, regular rhythm and normal heart sounds.   No murmur heard. Pulmonary/Chest: Effort normal. No respiratory distress. He has wheezes.       Wheezing, expiratory throughout worst in RUL  Abdominal: He exhibits no distension and no mass. There is no tenderness.  Musculoskeletal: He exhibits no edema.  Neurological: He is alert and oriented to person, place, and time.  Skin: Skin is warm.  Psychiatric: Memory, affect and judgment normal.   BP 157/77  Pulse 58  Temp(Src) 98.5 F (36.9 C) (Oral)  Ht 5\' 5"  (1.651 m)  Wt 186 lb (84.369 kg)  BMI 30.95 kg/m2  SpO2 98%      Lab Results  Component Value Date   TSH 2.32 07/19/2010   Lab Results  Component Value Date   WBC 3.5* 10/05/2010   HGB 14.5 10/05/2010   HCT 40.9 10/05/2010   MCV 94.7 10/05/2010   PLT 188.0 10/05/2010   Lab Results  Component Value Date   CREATININE 1.2 01/10/2011   BUN 19 01/10/2011   NA 138 01/10/2011   K 4.0 01/10/2011   CL 104 01/10/2011   CO2 26 01/10/2011   Lab Results  Component Value Date   ALT 26 07/19/2010   AST 23 01/10/2011   ALKPHOS 62 07/19/2010   BILITOT 1.2 07/19/2010   Lab Results  Component Value Date   CHOL 147 01/10/2011   Lab Results  Component Value Date   HDL 71.30 01/10/2011   Lab Results  Component Value Date   LDLCALC 59 01/10/2011   Lab Results  Component Value Date   TRIG 82.0 01/10/2011   Lab Results  Component Value Date   CHOLHDL 2 01/10/2011     Assessment & Plan  ESSENTIAL HYPERTENSION Mild elevation today with acute illness, no changes to medications at this  time.  Bronchitis Patient with persistent and worsening symptoms for past 3 weeks, start Bactrim DS bid, Mucinex bid and given a sample of Albuterol to use prn cough, wheeze, sob return if symptoms worsen or any concerns.   Physical Exam  Constitutional: He is oriented to person, place, and time and well-developed, well-nourished, and in no distress. No distress.  HENT:  Head: Normocephalic and atraumatic.  Eyes: Conjunctivae are normal.  Neck: Neck supple. No thyromegaly present.  Cardiovascular: Normal rate,  regular rhythm and normal heart sounds.   No murmur heard. Pulmonary/Chest: Effort normal. No respiratory distress. He has wheezes.       Wheezing, expiratory throughout worst in RUL  Abdominal: He exhibits no distension and no mass. There is no tenderness.  Musculoskeletal: He exhibits no edema.  Neurological: He is alert and oriented to person, place, and time.  Skin: Skin is warm.  Psychiatric: Memory, affect and judgment normal.

## 2011-02-05 NOTE — Assessment & Plan Note (Signed)
Patient with persistent and worsening symptoms for past 3 weeks, start Bactrim DS bid, Mucinex bid and given a sample of Albuterol to use prn cough, wheeze, sob return if symptoms worsen or any concerns.

## 2011-02-06 ENCOUNTER — Ambulatory Visit (INDEPENDENT_AMBULATORY_CARE_PROVIDER_SITE_OTHER): Payer: BC Managed Care – PPO | Admitting: Family Medicine

## 2011-02-06 ENCOUNTER — Encounter: Payer: Self-pay | Admitting: Family Medicine

## 2011-02-06 VITALS — BP 125/74 | HR 69 | Temp 98.4°F | Ht 65.0 in | Wt 183.0 lb

## 2011-02-06 DIAGNOSIS — J209 Acute bronchitis, unspecified: Secondary | ICD-10-CM

## 2011-02-06 DIAGNOSIS — J40 Bronchitis, not specified as acute or chronic: Secondary | ICD-10-CM

## 2011-02-06 MED ORDER — PREDNISONE 20 MG PO TABS: ORAL_TABLET | ORAL | Status: DC

## 2011-02-06 NOTE — Progress Notes (Signed)
OFFICE NOTE  02/06/2011  CC:  Chief Complaint  Patient presents with  . Bronchitis     HPI:   Patient is a 65 y.o. Caucasian male who is here for bronchitis. Seen here 7/20 and had been sick for 3 wks at that time.  Started bactrim, albuterol inhaler, and mucinex but didn't fill the tessalon. Says he's mildly to moderately improved but still bothered quite a bit by the cough, sometimes fits of coughing almost lead to fainting spell. Albuterol helps his chest tightness but not cough.  Sputum clearing up a bit. No fever.  Appetite fine.  Energy level still down.   Pertinent PMH: Former smoker Past Medical History  Diagnosis Date  . Elevated blood-pressure reading without diagnosis of hypertension   . Anxiety   . Diverticulosis   . Arthritis     L-Spine  . Hypertension   . Cardiomyopathy, nonischemic 10/12/10    Global hypokinesis, EF 40-45% by cath and echo  . Single vessel coronary artery disease 10/12/10    80% OM lesion, med mgmt per cards  . Aortic stenosis, mild 10/12/10    cath and echo  . CARDIAC MURMUR 08/13/2010  . ABNORMAL ELECTROCARDIOGRAM 07/19/2010  . Hx of colonoscopy 2002    normal per patient  . TOBACCO ABUSE 08/13/2010  . Bronchitis 02/05/2011    [facility];   Outpatient Prescriptions Prior to Visit  Medication Sig Dispense Refill  . albuterol (VENTOLIN HFA) 108 (90 BASE) MCG/ACT inhaler Inhale 2 puffs into the lungs every 6 (six) hours as needed for wheezing.  1 Inhaler  0  . atorvastatin (LIPITOR) 20 MG tablet Take 20 mg by mouth daily.        . carvedilol (COREG) 3.125 MG tablet Take 3.125 mg by mouth 2 (two) times daily with a meal.        . FLUoxetine (PROZAC) 10 MG capsule Take 10 mg by mouth daily.        Marland Kitchen guaiFENesin (MUCINEX) 600 MG 12 hr tablet Take 2 tablets (1,200 mg total) by mouth 2 (two) times daily.  20 tablet    . hydrochlorothiazide 25 MG tablet Take 25 mg by mouth daily.        Marland Kitchen lisinopril (PRINIVIL,ZESTRIL) 40 MG tablet Take 1 tablet (40 mg  total) by mouth daily.  30 tablet  11  . sulfamethoxazole-trimethoprim (BACTRIM DS) 800-160 MG per tablet Take 1 tablet by mouth 2 (two) times daily.  20 tablet  0  . benzonatate (TESSALON PERLES) 100 MG capsule Take 1 capsule (100 mg total) by mouth 2 (two) times daily as needed for cough.  20 capsule  0    PE: Blood pressure 125/74, pulse 69, temperature 98.4 F (36.9 C), temperature source Oral, height 5\' 5"  (1.651 m), weight 183 lb (83.008 kg), SpO2 95.00%. Gen: Alert, well appearing.  Patient is oriented to person, place, time, and situation. HEENT: Scalp without lesions or hair loss.  Ears: EACs clear, normal epithelium.  TMs with good light reflex and landmarks bilaterally.  Eyes: no injection, icteris, swelling, or exudate.  EOMI, PERRLA. Nose: no drainage or turbinate edema/swelling.  No injection or focal lesion.  Mouth: lips without lesion/swelling.  Oral mucosa pink and moist.  Dentition intact and without obvious caries or gingival swelling.  Oropharynx without erythema, exudate, or swelling.  Chest: symmetric expansion, nonlabored respirations.  Coarse exp wheeze diffusely, mostly clears up with vigorous coughing.  NO crackles, no areas of diminished BS.  Exp phase mildly prolonged. CV:  RRR, no m/r/g.  Peripheral pulses 2+ and symmetric. EXT: no clubbing, cyanosis, or edema.   IMPRESSION AND PLAN:  Bronchitis Add prednisone 40mg  qd x 5d.  Therapeutic expectations and side effect profile of medication discussed today.  Patient's questions answered.  Fill rx for tessalon perles that was given last visit. Continue bactrim, mucinex, and albuterol.      FOLLOW UP:  Return if symptoms worsen or fail to improve.

## 2011-02-06 NOTE — Assessment & Plan Note (Signed)
Add prednisone 40mg  qd x 5d.  Therapeutic expectations and side effect profile of medication discussed today.  Patient's questions answered.  Fill rx for tessalon perles that was given last visit. Continue bactrim, mucinex, and albuterol.

## 2011-02-12 ENCOUNTER — Encounter: Payer: Self-pay | Admitting: Family Medicine

## 2011-02-13 ENCOUNTER — Ambulatory Visit (INDEPENDENT_AMBULATORY_CARE_PROVIDER_SITE_OTHER): Payer: BC Managed Care – PPO | Admitting: Family Medicine

## 2011-02-13 ENCOUNTER — Encounter: Payer: Self-pay | Admitting: Family Medicine

## 2011-02-13 DIAGNOSIS — I10 Essential (primary) hypertension: Secondary | ICD-10-CM

## 2011-02-13 DIAGNOSIS — J40 Bronchitis, not specified as acute or chronic: Secondary | ICD-10-CM

## 2011-02-13 MED ORDER — ZOSTER VACCINE LIVE 19400 UNT/0.65ML ~~LOC~~ SOLR: 0.6500 mL | Freq: Once | SUBCUTANEOUS | Status: DC

## 2011-02-13 NOTE — Progress Notes (Signed)
OFFICE NOTE  02/13/2011  CC:  Chief Complaint  Patient presents with  . Hypertension     HPI:   Patient is a 65 y.o. Caucasian male who is here for f/u HTN and recent acute bronchitis. Feeling much improved the last 2d--cough subsiding, breathing feeling better.  No side effects noted from prednisone. Regarding HTN: home bp checks 120s/70s consistently.  Compliant with meds, with no c/o any side effects. No HA, no CP, no dizziness, no palpitations.  Pertinent PMH:  HTN Nonischemic cardiomyopathy Hx of tobacco dependence CAD Anxiety  MEDS;   Outpatient Prescriptions Prior to Visit  Medication Sig Dispense Refill  . albuterol (VENTOLIN HFA) 108 (90 BASE) MCG/ACT inhaler Inhale 2 puffs into the lungs every 6 (six) hours as needed for wheezing.  1 Inhaler  0  . aspirin 81 MG tablet Take 81 mg by mouth daily.        Marland Kitchen atorvastatin (LIPITOR) 20 MG tablet Take 20 mg by mouth daily.        . carvedilol (COREG) 3.125 MG tablet Take 3.125 mg by mouth 2 (two) times daily with a meal.        . FLUoxetine (PROZAC) 10 MG capsule Take 10 mg by mouth daily.        Marland Kitchen guaiFENesin (MUCINEX) 600 MG 12 hr tablet Take 2 tablets (1,200 mg total) by mouth 2 (two) times daily.  20 tablet    . hydrochlorothiazide 25 MG tablet Take 25 mg by mouth daily.        Marland Kitchen lisinopril (PRINIVIL,ZESTRIL) 40 MG tablet Take 1 tablet (40 mg total) by mouth daily.  30 tablet  11  . sulfamethoxazole-trimethoprim (BACTRIM DS) 800-160 MG per tablet Take 1 tablet by mouth 2 (two) times daily.  20 tablet  0  . predniSONE (DELTASONE) 20 MG tablet 2 tabs po qd x 5d  10 tablet  0    PE: Blood pressure 113/67, pulse 71, height 5\' 5"  (1.651 m), weight 182 lb (82.555 kg). Gen: Alert, well appearing.  Patient is oriented to person, place, time, and situation. Chest: symmetric expansion, nonlabored respirations.  Clear and equal breath sounds in all lung fields.   CV: RRR, no m/r/g.  Peripheral pulses 2+ and  symmetric.   IMPRESSION AND PLAN:  ESSENTIAL HYPERTENSION Problem stable.  Continue current medications and diet appropriate for this condition.  We have reviewed our general long term plan for this problem and also reviewed symptoms and signs that should prompt the patient to call or return to the office.   Bronchitis Improving/resolving appropriately.    Preventative health: zostavax rx given today.  He plans to fill it and get it administered at [facility].  FOLLOW UP:  Return in about 6 months (around 08/16/2011) for f/u HTN.

## 2011-02-13 NOTE — Assessment & Plan Note (Signed)
Improving/resolving appropriately.

## 2011-02-13 NOTE — Assessment & Plan Note (Signed)
Problem stable.  Continue current medications and diet appropriate for this condition.  We have reviewed our general long term plan for this problem and also reviewed symptoms and signs that should prompt the patient to call or return to the office.  

## 2011-03-05 ENCOUNTER — Other Ambulatory Visit: Payer: Self-pay | Admitting: Family Medicine

## 2011-03-05 NOTE — Telephone Encounter (Signed)
Patient last seen 02/13/11, next visit 08/16/11 per AVS.  RX sent.

## 2011-04-15 ENCOUNTER — Telehealth: Payer: Self-pay | Admitting: Family Medicine

## 2011-04-15 NOTE — Telephone Encounter (Signed)
Patient's wife wanted to know if patient's bloodwork tested him for Hepatitis C

## 2011-04-15 NOTE — Telephone Encounter (Signed)
Pt and wife saw article in paper yesterday regarding testing for Hep C in baby boomers.  Anthony Daugherty, thsi was a CDC recommendation.  Pt can come in for a lab appt or wait until his next office visit.  She will call insurance company to make sure it is covered.  She will call back to schedule if they want to go forward with this.

## 2011-06-21 ENCOUNTER — Ambulatory Visit: Payer: BC Managed Care – PPO | Admitting: Family Medicine

## 2011-08-16 ENCOUNTER — Encounter: Payer: Self-pay | Admitting: Family Medicine

## 2011-08-16 ENCOUNTER — Ambulatory Visit (INDEPENDENT_AMBULATORY_CARE_PROVIDER_SITE_OTHER): Payer: Medicare Other | Admitting: Family Medicine

## 2011-08-16 DIAGNOSIS — N529 Male erectile dysfunction, unspecified: Secondary | ICD-10-CM

## 2011-08-16 DIAGNOSIS — Z23 Encounter for immunization: Secondary | ICD-10-CM | POA: Diagnosis not present

## 2011-08-16 DIAGNOSIS — I2510 Atherosclerotic heart disease of native coronary artery without angina pectoris: Secondary | ICD-10-CM | POA: Diagnosis not present

## 2011-08-16 DIAGNOSIS — I10 Essential (primary) hypertension: Secondary | ICD-10-CM

## 2011-08-16 DIAGNOSIS — E785 Hyperlipidemia, unspecified: Secondary | ICD-10-CM

## 2011-08-16 DIAGNOSIS — Z125 Encounter for screening for malignant neoplasm of prostate: Secondary | ICD-10-CM | POA: Insufficient documentation

## 2011-08-16 DIAGNOSIS — I428 Other cardiomyopathies: Secondary | ICD-10-CM

## 2011-08-16 DIAGNOSIS — Z1211 Encounter for screening for malignant neoplasm of colon: Secondary | ICD-10-CM

## 2011-08-16 HISTORY — DX: Male erectile dysfunction, unspecified: N52.9

## 2011-08-16 LAB — COMPREHENSIVE METABOLIC PANEL
ALT: 20 U/L (ref 0–53)
AST: 21 U/L (ref 0–37)
Albumin: 4.5 g/dL (ref 3.5–5.2)
Alkaline Phosphatase: 59 U/L (ref 39–117)
Calcium: 9.2 mg/dL (ref 8.4–10.5)
Chloride: 102 mEq/L (ref 96–112)
Creatinine, Ser: 0.8 mg/dL (ref 0.4–1.5)
Potassium: 4.2 mEq/L (ref 3.5–5.1)

## 2011-08-16 NOTE — Assessment & Plan Note (Signed)
Lab Results  Component Value Date   CHOL 147 01/10/2011   HDL 71.30 01/10/2011   LDLCALC 59 01/10/2011   TRIG 82.0 01/10/2011   CHOLHDL 2 01/10/2011   Repeat FLP next f/u in 31mo.  Continue lipitor.

## 2011-08-16 NOTE — Progress Notes (Addendum)
OFFICE NOTE  08/16/2011  CC:  Chief Complaint  Patient presents with  . Hypertension    6 month follow up, BP usually 115-122 with home cuff (arm type)     HPI:   Patient is a 66 y.o. Caucasian male who is here for routine HTN, Hyperlipidemia, CAD, nonischemic cardiomyopathy f/u. Reports feeling good.  Checks bp at home and gets normal readings. Does light exercise and works outside in his yard and denies any CP or SOB.  He denies limitation of activity at all.   He is compliant with all meds.   He reports onset of mild ED after he was started on Coreg last year.  Sexual desire intact, erection ok for penetration and climax but "not as firm" as it was prior to coreg.   Pertinent PMH:  Past Medical History  Diagnosis Date  . Elevated blood-pressure reading without diagnosis of hypertension   . Anxiety   . Diverticulosis   . Arthritis     L-Spine  . Hypertension   . Cardiomyopathy, nonischemic 10/12/10    Global hypokinesis, EF 40-45% by cath and echo  . Single vessel coronary artery disease 10/12/10    80% OM lesion, med mgmt per cards  . Aortic stenosis, mild 10/12/10    cath and echo  . CARDIAC MURMUR 08/13/2010  . ABNORMAL ELECTROCARDIOGRAM 07/19/2010  . Hx of colonoscopy 2004    normal per patient  . TOBACCO ABUSE 08/13/2010  . Bronchitis 02/05/2011  . Obesity, Class I, BMI 30-34.9     MEDS;   Outpatient Prescriptions Prior to Visit  Medication Sig Dispense Refill  . aspirin 81 MG tablet Take 81 mg by mouth daily.        Marland Kitchen atorvastatin (LIPITOR) 20 MG tablet Take 20 mg by mouth daily.        Marland Kitchen FLUoxetine (PROZAC) 10 MG capsule Take 10 mg by mouth daily.        . hydrochlorothiazide 25 MG tablet TAKE 1 TABLET DAILY  30 tablet  5  . lisinopril (PRINIVIL,ZESTRIL) 40 MG tablet Take 1 tablet (40 mg total) by mouth daily.  30 tablet  11  . carvedilol (COREG) 3.125 MG tablet Take 3.125 mg by mouth 2 (two) times daily with a meal.        . albuterol (VENTOLIN HFA) 108 (90 BASE)  MCG/ACT inhaler Inhale 2 puffs into the lungs every 6 (six) hours as needed for wheezing.  1 Inhaler  0  . guaiFENesin (MUCINEX) 600 MG 12 hr tablet Take 2 tablets (1,200 mg total) by mouth 2 (two) times daily.  20 tablet    . zoster vaccine live, PF, (ZOSTAVAX) 40981 UNT/0.65ML injection Inject 19,400 Units into the skin once.  1 vial  0    PE: Blood pressure 158/77, pulse 62, height 5\' 5"  (1.651 m), weight 190 lb (86.183 kg). Gen: Alert, well appearing.  Patient is oriented to person, place, time, and situation. CV: RRR, soft systolic murmur, S1 and S2 ok, no diastolic murmur.  No rub or gallop or click.   LUNGS: CTA bilat, nonlabored resps, good aeration in all lung fields. ABD: soft, NT EXT: no clubbing, cyanosis, or edema.  Rectal exam: without mass, lesions, or tenderness.  Prostate smooth, no obvious enlargement.   IMPRESSION AND PLAN:  ESSENTIAL HYPERTENSION Problem stable.  Continue current medications and diet appropriate for this condition.  We have reviewed our general long term plan for this problem and also reviewed symptoms and signs that should  prompt the patient to call or return to the office. Check lytes/cr today.   Dyslipidemia Lab Results  Component Value Date   CHOL 147 01/10/2011   HDL 71.30 01/10/2011   LDLCALC 59 01/10/2011   TRIG 82.0 01/10/2011   CHOLHDL 2 01/10/2011   Repeat FLP next f/u in 64mo.  Continue lipitor.  Coronary artery disease Stable.  Continue routine cardiology f/u.  Cardiomyopathy, nonischemic Stable.  Continue medical mgmt and routine cardiology f/u. Due to ED from coreg will change to bystolic since it is associated with lower risk of ED (bystolic 5mg  samples given, 1 tab qd x 7d, then 2 tabs qd).  Colon cancer screening Reviewed need for repeat colonoscopy in 2014. He says he did an iFOB within the last year but I can't find results.  We talked to [facility] lab and they say they don't have record of ever receiving a specimen from  him.  Prostate cancer screening DRE normal today. PSA lab done today.  Erectile dysfunction Coincidental with starting coreg. Change coreg to bystolic as noted above.     FOLLOW UP:  Return in about 1 month (around 09/13/2011) for f/u bp med chang/ED.

## 2011-08-16 NOTE — Assessment & Plan Note (Signed)
Stable.  Continue medical mgmt and routine cardiology f/u. Due to ED from coreg will change to bystolic since it is associated with lower risk of ED (bystolic 5mg  samples given, 1 tab qd x 7d, then 2 tabs qd).

## 2011-08-16 NOTE — Assessment & Plan Note (Signed)
Coincidental with starting coreg. Change coreg to bystolic as noted above.

## 2011-08-16 NOTE — Assessment & Plan Note (Addendum)
Problem stable.  Continue current medications and diet appropriate for this condition.  We have reviewed our general long term plan for this problem and also reviewed symptoms and signs that should prompt the patient to call or return to the office. Check lytes/cr today.  

## 2011-08-16 NOTE — Assessment & Plan Note (Signed)
Stable.  Continue routine cardiology f/u. 

## 2011-08-16 NOTE — Assessment & Plan Note (Addendum)
Reviewed need for repeat colonoscopy in 2014. He says he did an iFOB within the last year but I can't find results.  We talked to [facility] lab and they say they don't have record of ever receiving a specimen from him.

## 2011-08-16 NOTE — Assessment & Plan Note (Signed)
DRE normal today. PSA lab done today.

## 2011-08-26 ENCOUNTER — Telehealth: Payer: Self-pay | Admitting: Emergency Medicine

## 2011-08-26 DIAGNOSIS — I10 Essential (primary) hypertension: Secondary | ICD-10-CM

## 2011-08-26 MED ORDER — NEBIVOLOL HCL 10 MG PO TABS: 10.0000 mg | ORAL_TABLET | Freq: Every day | ORAL | Status: DC

## 2011-08-26 NOTE — Telephone Encounter (Signed)
Pt has titrated up to 10 mg daily.  Advised I will call in 10 mg tabs so he will just take one.  Pt agreeable.  RX sent.

## 2011-08-26 NOTE — Telephone Encounter (Signed)
Patient has samples of Bystolic and needs prescription called in before his samples run out.Anthony KitchenMarland Daugherty

## 2011-09-06 ENCOUNTER — Other Ambulatory Visit: Payer: Self-pay

## 2011-09-06 DIAGNOSIS — I10 Essential (primary) hypertension: Secondary | ICD-10-CM

## 2011-09-06 MED ORDER — NEBIVOLOL HCL 10 MG PO TABS: 10.0000 mg | ORAL_TABLET | Freq: Every day | ORAL | Status: DC

## 2011-09-06 MED ORDER — HYDROCHLOROTHIAZIDE 25 MG PO TABS: 25.0000 mg | ORAL_TABLET | Freq: Every day | ORAL | Status: DC

## 2011-09-13 ENCOUNTER — Ambulatory Visit (INDEPENDENT_AMBULATORY_CARE_PROVIDER_SITE_OTHER): Payer: Medicare Other | Admitting: Family Medicine

## 2011-09-13 ENCOUNTER — Encounter: Payer: Self-pay | Admitting: Family Medicine

## 2011-09-13 VITALS — BP 133/62 | HR 63 | Temp 99.4°F | Ht 65.0 in | Wt 195.0 lb

## 2011-09-13 DIAGNOSIS — J209 Acute bronchitis, unspecified: Secondary | ICD-10-CM | POA: Insufficient documentation

## 2011-09-13 DIAGNOSIS — N529 Male erectile dysfunction, unspecified: Secondary | ICD-10-CM | POA: Diagnosis not present

## 2011-09-13 DIAGNOSIS — I10 Essential (primary) hypertension: Secondary | ICD-10-CM | POA: Diagnosis not present

## 2011-09-13 MED ORDER — ALBUTEROL SULFATE HFA 108 (90 BASE) MCG/ACT IN AERS: 2.0000 | INHALATION_SPRAY | RESPIRATORY_TRACT | Status: DC | PRN

## 2011-09-13 MED ORDER — PREDNISONE 20 MG PO TABS: ORAL_TABLET | ORAL | Status: DC

## 2011-09-17 ENCOUNTER — Telehealth: Payer: Self-pay | Admitting: Family Medicine

## 2011-09-17 NOTE — Telephone Encounter (Signed)
Pt not feeling better.  He is still having a lot of wheezing.  Pt did not get albuterol inhaler due to an entry error on our part.  Advised pt I will send to pharmacy tonight.  He will pick up tomorrow.  Pt finished prednisone today.  He will come in tomorrow for follow up.

## 2011-09-17 NOTE — Telephone Encounter (Signed)
Please contact patient, he is not getting any better

## 2011-09-18 ENCOUNTER — Encounter: Payer: Self-pay | Admitting: Family Medicine

## 2011-09-18 ENCOUNTER — Ambulatory Visit (INDEPENDENT_AMBULATORY_CARE_PROVIDER_SITE_OTHER): Payer: Medicare Other | Admitting: Family Medicine

## 2011-09-18 VITALS — BP 174/69 | HR 51 | Temp 98.3°F | Ht 65.0 in | Wt 197.0 lb

## 2011-09-18 DIAGNOSIS — J209 Acute bronchitis, unspecified: Secondary | ICD-10-CM | POA: Diagnosis not present

## 2011-09-18 MED ORDER — AZITHROMYCIN 250 MG PO TABS: ORAL_TABLET | ORAL | Status: DC

## 2011-09-18 MED ORDER — ALBUTEROL SULFATE HFA 108 (90 BASE) MCG/ACT IN AERS: 2.0000 | INHALATION_SPRAY | RESPIRATORY_TRACT | Status: AC | PRN

## 2011-09-18 MED ORDER — BUDESONIDE-FORMOTEROL FUMARATE 80-4.5 MCG/ACT IN AERO: 2.0000 | INHALATION_SPRAY | Freq: Two times a day (BID) | RESPIRATORY_TRACT | Status: DC

## 2011-09-18 NOTE — Assessment & Plan Note (Signed)
Prednisone 40mg  qd x 5d. Ventolin RF done: 2 puffs q4h prn. Avoid decongestants. Mucinex DM recommended.

## 2011-09-18 NOTE — Assessment & Plan Note (Addendum)
Minimally improved in the last week but certainly stable.  I do think he has underlying COPD that is mild. BP up today (recheck 162/78, right arm), suspect this is possibly from recent 5d course of systemic steroids.  No further systemic steroids today. Will start symbicort 160/4.5, 2 p bid, sample +rx given today.  Therapeutic expectations and side effect profile of medication discussed today.  Patient's questions answered. We'll make sure his ventolin Rx RF gets to pharmacy now. Azithromycin x 5d started today.  He'll monitor bp at home until f/u in 7d.

## 2011-09-18 NOTE — Assessment & Plan Note (Signed)
Pt reports onset of this coincidental with starting coreg. Doing better with bystolic in place of coreg.

## 2011-09-18 NOTE — Progress Notes (Signed)
OFFICE NOTE  09/18/2011  CC:  Chief Complaint  Patient presents with  . Follow-up    HTN     HPI: Patient is a 66 y.o. Caucasian male who is here for f/u HTN.  One month ago I changed him from Coreg to bystolic to see if his ED improved.  He reports that it is better, but not completely resolved/back to baseline. He wants to wait and give bystolic more time.    Pt presents complaining of respiratory symptoms for 3  days.  Mild nasal congestion/runny nose, PND cough.  Worst symptoms seems to be the persistent cough, occasionally productive of some clear phlegm.  Some wheezing.  He is out of his ventolin.  Lately the symptoms seem to be getting worse. No fevers and no SOB.  No pain in face or teeth.  No significant HA.  ST mild at most.  Symptoms made worse by activity, cool air.  Symptoms improved by nothing. Smoker? former Recent sick contact? unknown Muscle or joint aches? no Flu shot this season at least 2 wks ago? yes  ROS: no n/v/d or abdominal pain.  No rash.  No neck stiffness.   +Mild fatigue.  +Mild appetite loss.   Pertinent PMH:  Past Medical History  Diagnosis Date  . Elevated blood-pressure reading without diagnosis of hypertension   . Anxiety   . Diverticulosis   . Arthritis     L-Spine  . Hypertension   . Cardiomyopathy, nonischemic 10/12/10    Global hypokinesis, EF 40-45% by cath and echo  . Single vessel coronary artery disease 10/12/10    80% OM lesion, med mgmt per cards  . Aortic stenosis, mild 10/12/10    cath and echo  . CARDIAC MURMUR 08/13/2010  . ABNORMAL ELECTROCARDIOGRAM 07/19/2010  . Hx of colonoscopy 2004    normal per patient  . TOBACCO ABUSE 08/13/2010  . Bronchitis 02/05/2011  . Obesity, Class I, BMI 30-34.9     MEDS:  Outpatient Prescriptions Prior to Visit  Medication Sig Dispense Refill  . aspirin 81 MG tablet Take 81 mg by mouth daily.        Marland Kitchen atorvastatin (LIPITOR) 20 MG tablet Take 20 mg by mouth daily.        Marland Kitchen FLUoxetine (PROZAC)  10 MG capsule Take 10 mg by mouth daily.        . hydrochlorothiazide (HYDRODIURIL) 25 MG tablet Take 1 tablet (25 mg total) by mouth daily.  30 tablet  1  . lisinopril (PRINIVIL,ZESTRIL) 40 MG tablet Take 1 tablet (40 mg total) by mouth daily.  30 tablet  11  . nebivolol (BYSTOLIC) 10 MG tablet Take 1 tablet (10 mg total) by mouth daily.  30 tablet  1  . albuterol (VENTOLIN HFA) 108 (90 BASE) MCG/ACT inhaler Inhale 2 puffs into the lungs every 6 (six) hours as needed for wheezing.  1 Inhaler  0    PE: Blood pressure 133/62, pulse 63, temperature 99.4 F (37.4 C), temperature source Temporal, height 5\' 5"  (1.651 m), weight 195 lb (88.451 kg). VS: noted--normal. Gen: alert, NAD, well-appearing.  HEENT: eyes without injection, drainage, or swelling.  Ears: EACs clear, TMs with normal light reflex and landmarks.  Nose: Clear rhinorrhea, with some dried, crusty exudate adherent to mildly injected mucosa.  No purulent d/c.  No paranasal sinus TTP.  No facial swelling.  Throat and mouth without focal lesion.  No pharyngial swelling, erythema, or exudate.   Neck: supple, no LAD.   LUNGS:  Good aeration, trace diffuse exp rhonchorous sounds which clear nearly completely with coughing, nonlabored resps.   CV: RRR, no m/r/g. EXT: no c/c/e SKIN: no rash  LABS: none today  IMPRESSION AND PLAN:  ESSENTIAL HYPERTENSION Problem stable.  Continue current medications and diet appropriate for this condition.  We have reviewed our general long term plan for this problem and also reviewed symptoms and signs that should prompt the patient to call or return to the office.   Erectile dysfunction Pt reports onset of this coincidental with starting coreg. Doing better with bystolic in place of coreg.  Acute bronchitis Prednisone 40mg  qd x 5d. Ventolin RF done: 2 puffs q4h prn. Avoid decongestants. Mucinex DM recommended.      FOLLOW UP: 55mo

## 2011-09-18 NOTE — Assessment & Plan Note (Signed)
Problem stable.  Continue current medications and diet appropriate for this condition.  We have reviewed our general long term plan for this problem and also reviewed symptoms and signs that should prompt the patient to call or return to the office.  

## 2011-09-18 NOTE — Progress Notes (Signed)
OFFICE NOTE  09/18/2011  CC:  Chief Complaint  Patient presents with  . Wheezing    finished prednisone, but inhaler RX did not go to pharm, pt has not been able to use.     HPI: Patient is a 66 y.o. Caucasian male who is here for ongoing cough, chest tightness, wheezing. Feels better today a little.  No fevers.  Appetite not that good, energy level not that good.   His albuterol rx did not go through to his pharmacy last week, so he has gone all week w/out this.  Pertinent PMH:  Past Medical History  Diagnosis Date  . Elevated blood-pressure reading without diagnosis of hypertension   . Anxiety   . Diverticulosis   . Arthritis     L-Spine  . Hypertension   . Cardiomyopathy, nonischemic 10/12/10    Global hypokinesis, EF 40-45% by cath and echo  . Single vessel coronary artery disease 10/12/10    80% OM lesion, med mgmt per cards  . Aortic stenosis, mild 10/12/10    cath and echo  . CARDIAC MURMUR 08/13/2010  . ABNORMAL ELECTROCARDIOGRAM 07/19/2010  . Hx of colonoscopy 2004    normal per patient--repeat 2014.  . TOBACCO ABUSE 08/13/2010  . Bronchitis 02/05/2011  . Obesity, Class I, BMI 30-34.9     MEDS:  Outpatient Prescriptions Prior to Visit  Medication Sig Dispense Refill  . aspirin 81 MG tablet Take 81 mg by mouth daily.        Marland Kitchen atorvastatin (LIPITOR) 20 MG tablet Take 20 mg by mouth daily.        Marland Kitchen FLUoxetine (PROZAC) 10 MG capsule Take 10 mg by mouth daily.        . hydrochlorothiazide (HYDRODIURIL) 25 MG tablet Take 1 tablet (25 mg total) by mouth daily.  30 tablet  1  . nebivolol (BYSTOLIC) 10 MG tablet Take 1 tablet (10 mg total) by mouth daily.  30 tablet  1  . albuterol (VENTOLIN HFA) 108 (90 BASE) MCG/ACT inhaler Inhale 2 puffs into the lungs every 4 (four) hours as needed for wheezing.  1 Inhaler  1  . lisinopril (PRINIVIL,ZESTRIL) 40 MG tablet Take 1 tablet (40 mg total) by mouth daily.  30 tablet  11  . predniSONE (DELTASONE) 20 MG tablet 2 tabs po qd x 5d  10  tablet  0    PE: Blood pressure 174/69, pulse 51, temperature 98.3 F (36.8 C), temperature source Temporal, height 5\' 5"  (1.651 m), weight 197 lb (89.359 kg), SpO2 95.00%. Gen: Alert, well appearing.  Patient is oriented to person, place, time, and situation. CV: RRR, 2-3/6 systolic murmur, no diastolic murmur.   LUNGS: Diffuse exp wheezing with mild prolongation of exp phase.  Aeration is decent, breathing nonlabored.  Inspiration is clear. EXT: no clubbing, cyanosis, or edema.    IMPRESSION AND PLAN:  Acute bronchitis Minimally improved in the last week but certainly stable.  I do think he has underlying COPD that is mild. BP up today (recheck 162/78, right arm), suspect this is possibly from recent 5d course of systemic steroids.  No further systemic steroids today. Will start symbicort 160/4.5, 2 p bid, sample +rx given today.  Therapeutic expectations and side effect profile of medication discussed today.  Patient's questions answered. We'll make sure his ventolin Rx RF gets to pharmacy now. Azithromycin x 5d started today.  He'll monitor bp at home until f/u in 7d.      FOLLOW UP: 7d

## 2011-09-27 ENCOUNTER — Encounter: Payer: Self-pay | Admitting: Family Medicine

## 2011-09-27 ENCOUNTER — Other Ambulatory Visit: Payer: Self-pay | Admitting: *Deleted

## 2011-09-27 ENCOUNTER — Ambulatory Visit (INDEPENDENT_AMBULATORY_CARE_PROVIDER_SITE_OTHER): Payer: Medicare Other | Admitting: Family Medicine

## 2011-09-27 VITALS — BP 160/76 | HR 55 | Temp 97.2°F | Ht 65.0 in | Wt 193.0 lb

## 2011-09-27 DIAGNOSIS — J209 Acute bronchitis, unspecified: Secondary | ICD-10-CM | POA: Diagnosis not present

## 2011-09-27 DIAGNOSIS — I10 Essential (primary) hypertension: Secondary | ICD-10-CM

## 2011-09-27 NOTE — Progress Notes (Signed)
OFFICE NOTE  09/27/2011  CC:  Chief Complaint  Patient presents with  . Follow-up    bronchitis     HPI: Patient is a 66 y.o. Caucasian male who is here for f/u acute bronchitis/COPD exacerbation. He is feeling MUCH improved--says he hardly has any chest tightness, wheezing, or coughing at all now. Using symbicort qAM but only some evenings.  Not requiring albuterol lately at all.  Finished Z-pack.  Has not checked bp at home at all lately but he has a cuff. Denies HA, CP, palpitations, or LE edema. We noted increase bp in office last visit when he had just completed a steroid burst, so we thought this may be the culprit.    Pertinent PMH:  Past Medical History  Diagnosis Date  . Elevated blood-pressure reading without diagnosis of hypertension   . Anxiety   . Diverticulosis   . Arthritis     L-Spine  . Hypertension   . Cardiomyopathy, nonischemic 10/12/10    Global hypokinesis, EF 40-45% by cath and echo  . Single vessel coronary artery disease 10/12/10    80% OM lesion, med mgmt per cards  . Aortic stenosis, mild 10/12/10    cath and echo  . CARDIAC MURMUR 08/13/2010  . ABNORMAL ELECTROCARDIOGRAM 07/19/2010  . Hx of colonoscopy 2004    normal per patient--repeat 2014.  . TOBACCO ABUSE 08/13/2010  . Bronchitis 02/05/2011  . Obesity, Class I, BMI 30-34.9     MEDS:  Outpatient Prescriptions Prior to Visit  Medication Sig Dispense Refill  . albuterol (VENTOLIN HFA) 108 (90 BASE) MCG/ACT inhaler Inhale 2 puffs into the lungs every 4 (four) hours as needed for wheezing.  1 Inhaler  1  . aspirin 81 MG tablet Take 81 mg by mouth daily.        Marland Kitchen atorvastatin (LIPITOR) 20 MG tablet Take 20 mg by mouth daily.        . budesonide-formoterol (SYMBICORT) 80-4.5 MCG/ACT inhaler Inhale 2 puffs into the lungs 2 (two) times daily.  1 Inhaler  3  . FLUoxetine (PROZAC) 10 MG capsule Take 10 mg by mouth daily.        . hydrochlorothiazide (HYDRODIURIL) 25 MG tablet Take 1 tablet (25 mg  total) by mouth daily.  30 tablet  1  . lisinopril (PRINIVIL,ZESTRIL) 40 MG tablet Take 1 tablet (40 mg total) by mouth daily.  30 tablet  11  . nebivolol (BYSTOLIC) 10 MG tablet Take 1 tablet (10 mg total) by mouth daily.  30 tablet  1  . azithromycin (ZITHROMAX) 250 MG tablet 2 tabs po qd x 1d, then 1 tab po qd x4d  6 each  0    PE: Blood pressure 160/76, pulse 55, temperature 97.2 F (36.2 C), temperature source Temporal, height 5\' 5"  (1.651 m), weight 193 lb (87.544 kg). CV: RRR, soft systolic murmur.  No rub or gallop. Chest with symmetric expansion, good aeration, nonlabored respirations.  Clear and equal breath sounds in all lung fields.  No clubbing or cyanosis.   IMPRESSION AND PLAN:  Acute bronchitis Resolved. Continue symbicort 160/4.5 2 puffs bid for underlying COPD. Continue albuterol q6h prn.  Therapeutic expectations and side effect profile of medication discussed today.  Patient's questions answered.   ESSENTIAL HYPERTENSION Still up in office.  He needs to get some home bp checks to help in decision-making regarding treatment at this point. Check bp once daily for 2 wks and bring these to the office. If top number consistently >170 or  bottom number consistently >110 then call or return before the two week "check in".      FOLLOW UP: he has f/u scheduled for June 2013 already.  We'll adjust if necessary based on his home bps over the next 2 wks.

## 2011-09-27 NOTE — Assessment & Plan Note (Addendum)
Still up in office.  He needs to get some home bp checks to help in decision-making regarding treatment at this point. Check bp once daily for 2 wks and bring these to the office. If top number consistently >170 or bottom number consistently >110 then call or return before the two week "check in".

## 2011-09-27 NOTE — Assessment & Plan Note (Signed)
Resolved. Continue symbicort 160/4.5 2 puffs bid for underlying COPD. Continue albuterol q6h prn.  Therapeutic expectations and side effect profile of medication discussed today.  Patient's questions answered.

## 2011-09-27 NOTE — Patient Instructions (Signed)
Your bp goal is <130 on top and <80 on bottom. Check bp once daily for 2 wks and bring these to the office. If top number consistently >170 or bottom number consistently >110 then call or return before the two week "check in".

## 2011-11-05 ENCOUNTER — Other Ambulatory Visit: Payer: Self-pay | Admitting: Family Medicine

## 2011-11-05 MED ORDER — ATORVASTATIN CALCIUM 20 MG PO TABS: 20.0000 mg | ORAL_TABLET | Freq: Every day | ORAL | Status: DC

## 2011-11-05 NOTE — Telephone Encounter (Signed)
RX sent

## 2011-11-27 ENCOUNTER — Other Ambulatory Visit: Payer: Self-pay | Admitting: *Deleted

## 2011-11-27 MED ORDER — HYDROCHLOROTHIAZIDE 25 MG PO TABS: 25.0000 mg | ORAL_TABLET | Freq: Every day | ORAL | Status: DC

## 2011-11-27 NOTE — Telephone Encounter (Signed)
Faxed refill request received from pharmacy for HCTZ Last filled by MD on 09/06/11, #30 x 1 Last seen on 09/13/11 Follow up 3 months, appt 12/20/11 RX sent.

## 2011-12-18 ENCOUNTER — Other Ambulatory Visit: Payer: Self-pay

## 2011-12-18 NOTE — Telephone Encounter (Signed)
RX request for Lisinopril. Med list is stating expired? Is patient still supposed to be taking this? Please advise?

## 2011-12-18 NOTE — Telephone Encounter (Signed)
Yes, ok to RF x 6 as previously prescribed.-thx

## 2011-12-19 MED ORDER — LISINOPRIL 40 MG PO TABS: 40.0000 mg | ORAL_TABLET | Freq: Every day | ORAL | Status: DC

## 2011-12-19 NOTE — Telephone Encounter (Signed)
RX sent to pharmacy  

## 2011-12-20 ENCOUNTER — Ambulatory Visit (INDEPENDENT_AMBULATORY_CARE_PROVIDER_SITE_OTHER): Payer: Medicare Other | Admitting: Family Medicine

## 2011-12-20 ENCOUNTER — Encounter: Payer: Self-pay | Admitting: Family Medicine

## 2011-12-20 VITALS — BP 182/76 | HR 55 | Temp 97.8°F | Ht 65.0 in | Wt 188.4 lb

## 2011-12-20 DIAGNOSIS — L989 Disorder of the skin and subcutaneous tissue, unspecified: Secondary | ICD-10-CM | POA: Diagnosis not present

## 2011-12-20 DIAGNOSIS — I10 Essential (primary) hypertension: Secondary | ICD-10-CM | POA: Diagnosis not present

## 2011-12-20 DIAGNOSIS — E785 Hyperlipidemia, unspecified: Secondary | ICD-10-CM

## 2011-12-20 NOTE — Assessment & Plan Note (Signed)
BP up here today but large majority of home measurements are good.  I won't change management at this time.

## 2011-12-20 NOTE — Progress Notes (Signed)
OFFICE VISIT  12/20/2011   CC:  Chief Complaint  Patient presents with  . Follow-up    3 month follow up on BP     HPI:    Patient is a 66 y.o. Caucasian male who presents for 40mo f/u HTN, hx of tob abuse/bronchitis. Feels good.  Review of some home bps show most of them 130s-140s/60s-70s, pulse 50's-60s. Cough is much less lately than usual.  ROS: no HAs, no CP, no SOB, no vision complaints, no dizziness.   Past Medical History  Diagnosis Date  . Elevated blood-pressure reading without diagnosis of hypertension   . Anxiety   . Diverticulosis   . Arthritis     L-Spine  . Hypertension   . Cardiomyopathy, nonischemic 10/12/10    Global hypokinesis, EF 40-45% by cath and echo  . Single vessel coronary artery disease 10/12/10    80% OM lesion, med mgmt per cards  . Aortic stenosis, mild 10/12/10    cath and echo  . CARDIAC MURMUR 08/13/2010  . ABNORMAL ELECTROCARDIOGRAM 07/19/2010  . Hx of colonoscopy 2004    normal per patient--repeat 2014.  . TOBACCO ABUSE 08/13/2010  . Bronchitis 02/05/2011  . Obesity, Class I, BMI 30-34.9     Past Surgical History  Procedure Date  . Cardiac catheterization 10/12/10    diagnostic only: 1 vessel CAD with TIMI 3 flow, EF 40-45%.  Mild AS.  . Transthoracic echocardiogram 09/2010    Outpatient Prescriptions Prior to Visit  Medication Sig Dispense Refill  . aspirin 81 MG tablet Take 81 mg by mouth daily.        Marland Kitchen atorvastatin (LIPITOR) 20 MG tablet Take 1 tablet (20 mg total) by mouth daily.  30 tablet  1  . FLUoxetine (PROZAC) 10 MG capsule Take 10 mg by mouth daily.        . hydrochlorothiazide (HYDRODIURIL) 25 MG tablet Take 1 tablet (25 mg total) by mouth daily.  30 tablet  5  . lisinopril (PRINIVIL,ZESTRIL) 40 MG tablet Take 1 tablet (40 mg total) by mouth daily.  30 tablet  6  . nebivolol (BYSTOLIC) 10 MG tablet Take 1 tablet (10 mg total) by mouth daily.  30 tablet  1  . albuterol (VENTOLIN HFA) 108 (90 BASE) MCG/ACT inhaler Inhale 2  puffs into the lungs every 4 (four) hours as needed for wheezing.  1 Inhaler  1  . budesonide-formoterol (SYMBICORT) 80-4.5 MCG/ACT inhaler Inhale 2 puffs into the lungs 2 (two) times daily.  1 Inhaler  3    Allergies  Allergen Reactions  . Penicillins     ROS As per HPI  PE: Blood pressure 182/76, pulse 55, temperature 97.8 F (36.6 C), temperature source Temporal, height 5\' 5"  (1.651 m), weight 188 lb 6.4 oz (85.458 kg), SpO2 99.00%. Gen: Alert, well appearing.  Patient is oriented to person, place, time, and situation. CV: Regular, rate about 60, 2/6 syst murmur over all of precordium, S2 obscured more than S1.  No diastolic murmur.   LUNGS: CTA bilat, nonlabored resps. EXT: no clubbing, cyanosis, or edema.  SKIN: top of right trapezius area has a 2mm hyperkeratotic pedunculated papule, flesh colored.  LABS:  none  IMPRESSION AND PLAN:  ESSENTIAL HYPERTENSION BP up here today but large majority of home measurements are good.  I won't change management at this time.   Skin lesion Right trap/shoulder region: it appears to be benign but it gets snagged on clothing, occ bleeds, bothers him, and he desires removal. He'll  make separate appt at his convenience for removal of this lesion here.  Dyslipidemia Lab Results  Component Value Date   CHOL 147 01/10/2011   HDL 71.30 01/10/2011   LDLCALC 59 01/10/2011   TRIG 82.0 01/10/2011   CHOLHDL 2 01/10/2011   He'll continue statin.  He'll get fasting lipid panel when he returns for his skin lesion removal.     FOLLOW UP: Return for F/u at pt's convenience for 30 min appt for skin lesion removal..

## 2011-12-20 NOTE — Assessment & Plan Note (Signed)
Lab Results  Component Value Date   CHOL 147 01/10/2011   HDL 71.30 01/10/2011   LDLCALC 59 01/10/2011   TRIG 82.0 01/10/2011   CHOLHDL 2 01/10/2011   He'll continue statin.  He'll get fasting lipid panel when he returns for his skin lesion removal.

## 2011-12-20 NOTE — Assessment & Plan Note (Signed)
Right trap/shoulder region: it appears to be benign but it gets snagged on clothing, occ bleeds, bothers him, and he desires removal. He'll make separate appt at his convenience for removal of this lesion here.

## 2011-12-23 ENCOUNTER — Ambulatory Visit (INDEPENDENT_AMBULATORY_CARE_PROVIDER_SITE_OTHER): Payer: Medicare Other | Admitting: Family Medicine

## 2011-12-23 ENCOUNTER — Encounter: Payer: Self-pay | Admitting: Family Medicine

## 2011-12-23 VITALS — BP 136/69 | HR 50 | Temp 97.8°F | Ht 65.0 in | Wt 189.0 lb

## 2011-12-23 DIAGNOSIS — L989 Disorder of the skin and subcutaneous tissue, unspecified: Secondary | ICD-10-CM

## 2011-12-23 DIAGNOSIS — E785 Hyperlipidemia, unspecified: Secondary | ICD-10-CM | POA: Diagnosis not present

## 2011-12-23 DIAGNOSIS — L821 Other seborrheic keratosis: Secondary | ICD-10-CM | POA: Diagnosis not present

## 2011-12-23 NOTE — Assessment & Plan Note (Signed)
Appears benign. Removed today completely, sent to pathology. Pt to return in 7d for removal of sutures.

## 2011-12-23 NOTE — Progress Notes (Signed)
OFFICE NOTE  12/23/2011  CC:  Chief Complaint  Patient presents with  . Procedure    remove skin lesion right shoulder     HPI: Patient is a 66 y.o. Caucasian male who is here for lesion removal. Has had approx 3 mm horny, pedunculated, dome shaped papule for months, says it catches on his shirt often, asks for it to be removed.  No hx of skin malignancy.  He feels well today.    Pertinent PMH:  Past Medical History  Diagnosis Date  . Elevated blood-pressure reading without diagnosis of hypertension   . Anxiety   . Diverticulosis   . Arthritis     L-Spine  . Hypertension   . Cardiomyopathy, nonischemic 10/12/10    Global hypokinesis, EF 40-45% by cath and echo  . Single vessel coronary artery disease 10/12/10    80% OM lesion, med mgmt per cards  . Aortic stenosis, mild 10/12/10    cath and echo  . CARDIAC MURMUR 08/13/2010  . ABNORMAL ELECTROCARDIOGRAM 07/19/2010  . Hx of colonoscopy 2004    normal per patient--repeat 2014.  . TOBACCO ABUSE 08/13/2010  . Bronchitis 02/05/2011  . Obesity, Class I, BMI 30-34.9     MEDS:  Outpatient Prescriptions Prior to Visit  Medication Sig Dispense Refill  . albuterol (VENTOLIN HFA) 108 (90 BASE) MCG/ACT inhaler Inhale 2 puffs into the lungs every 4 (four) hours as needed for wheezing.  1 Inhaler  1  . aspirin 81 MG tablet Take 81 mg by mouth daily.        Marland Kitchen atorvastatin (LIPITOR) 20 MG tablet Take 1 tablet (20 mg total) by mouth daily.  30 tablet  1  . budesonide-formoterol (SYMBICORT) 80-4.5 MCG/ACT inhaler Inhale 2 puffs into the lungs 2 (two) times daily.  1 Inhaler  3  . FLUoxetine (PROZAC) 10 MG capsule Take 10 mg by mouth daily.        . hydrochlorothiazide (HYDRODIURIL) 25 MG tablet Take 1 tablet (25 mg total) by mouth daily.  30 tablet  5  . lisinopril (PRINIVIL,ZESTRIL) 40 MG tablet Take 1 tablet (40 mg total) by mouth daily.  30 tablet  6  . nebivolol (BYSTOLIC) 10 MG tablet Take 1 tablet (10 mg total) by mouth daily.  30 tablet   1    PE: Blood pressure 136/69, pulse 50, temperature 97.8 F (36.6 C), temperature source Temporal, height 5\' 5"  (1.651 m), weight 189 lb (85.73 kg). Gen: Alert, well appearing.  Patient is oriented to person, place, time, and situation. Right shoulder/base of right side of neck: 2-3 mm keratinacious, pedunculated, dome-shaped papule that is flesh colored.   PROCEDURE: excision of benign-appearing skin lesion: obtained pt consent, prepped pt in sterile fashion, cut out ellipse-shaped portion of skin surrounding/including the lesion and placed this in specimen container to be sent to pathology.  Used 4-0 ethilon suture to close wound superficially--3 interrupted sutures placed. No immediate complications, bleeding minimal, pt tolerated procedure well.  IMPRESSION AND PLAN:  Skin lesion Appears benign. Removed today completely, sent to pathology. Pt to return in 7d for removal of sutures.  Dyslipidemia Pt is fasting today: we did a FLP today.      FOLLOW UP: 7d for suture removal

## 2011-12-23 NOTE — Assessment & Plan Note (Signed)
Pt is fasting today: we did a FLP today.

## 2011-12-23 NOTE — Progress Notes (Signed)
Addended by: Luisa Dago on: 12/23/2011 05:16 PM   Modules accepted: Orders

## 2011-12-24 ENCOUNTER — Other Ambulatory Visit: Payer: Self-pay | Admitting: *Deleted

## 2011-12-24 DIAGNOSIS — I10 Essential (primary) hypertension: Secondary | ICD-10-CM

## 2011-12-24 LAB — LIPID PANEL
Cholesterol: 122 mg/dL (ref 0–200)
LDL Cholesterol: 49 mg/dL (ref 0–99)
Total CHOL/HDL Ratio: 2
Triglycerides: 84 mg/dL (ref 0.0–149.0)
VLDL: 16.8 mg/dL (ref 0.0–40.0)

## 2011-12-24 MED ORDER — NEBIVOLOL HCL 10 MG PO TABS: 10.0000 mg | ORAL_TABLET | Freq: Every day | ORAL | Status: DC

## 2011-12-24 NOTE — Telephone Encounter (Signed)
Faxed refill request received from pharmacy for bystolic Last filled by MD on 1/61/09 Last seen on 12/20/11 Follow up (not noted for BP follow up.) RX sent.

## 2011-12-27 ENCOUNTER — Ambulatory Visit: Payer: Medicare Other | Admitting: Family Medicine

## 2011-12-30 ENCOUNTER — Encounter: Payer: Self-pay | Admitting: Family Medicine

## 2011-12-30 ENCOUNTER — Ambulatory Visit (INDEPENDENT_AMBULATORY_CARE_PROVIDER_SITE_OTHER): Payer: Medicare Other | Admitting: Family Medicine

## 2011-12-30 VITALS — BP 162/70 | HR 55 | Ht 65.0 in | Wt 188.0 lb

## 2011-12-30 DIAGNOSIS — L989 Disorder of the skin and subcutaneous tissue, unspecified: Secondary | ICD-10-CM

## 2011-12-30 NOTE — Progress Notes (Signed)
66 y/o WM here for suture removal, right side of neck/shoulder benign skin lesion excision 1 wk ago. Path: benign. He has no complaints about the wound. Wound appears clean, dry, edges well approximated, nontender. I removed the 3 sutures today without problem.  I placed 3 steri-strips over the wound. General wound care discussed. Return prn.

## 2012-01-10 ENCOUNTER — Other Ambulatory Visit: Payer: Self-pay | Admitting: *Deleted

## 2012-01-10 MED ORDER — ATORVASTATIN CALCIUM 20 MG PO TABS: 20.0000 mg | ORAL_TABLET | Freq: Every day | ORAL | Status: DC

## 2012-03-28 DIAGNOSIS — G44209 Tension-type headache, unspecified, not intractable: Secondary | ICD-10-CM | POA: Diagnosis not present

## 2012-03-28 DIAGNOSIS — H2510 Age-related nuclear cataract, unspecified eye: Secondary | ICD-10-CM | POA: Diagnosis not present

## 2012-05-04 ENCOUNTER — Other Ambulatory Visit: Payer: Self-pay | Admitting: *Deleted

## 2012-05-04 DIAGNOSIS — I10 Essential (primary) hypertension: Secondary | ICD-10-CM

## 2012-05-04 MED ORDER — NEBIVOLOL HCL 10 MG PO TABS: 10.0000 mg | ORAL_TABLET | Freq: Every day | ORAL | Status: DC

## 2012-05-04 NOTE — Telephone Encounter (Signed)
Faxed refill request received from pharmacy for BYSTOLIC  Last filled by MD on 12/24/11, #30 X 3 Last seen on 12/20/11 Follow up not noted. Refill sent per [facility] refill protocol.

## 2012-07-03 ENCOUNTER — Other Ambulatory Visit: Payer: Self-pay | Admitting: *Deleted

## 2012-07-03 MED ORDER — HYDROCHLOROTHIAZIDE 25 MG PO TABS: 25.0000 mg | ORAL_TABLET | Freq: Every day | ORAL | Status: DC

## 2012-07-20 ENCOUNTER — Encounter: Payer: Self-pay | Admitting: Family Medicine

## 2012-07-20 ENCOUNTER — Ambulatory Visit (INDEPENDENT_AMBULATORY_CARE_PROVIDER_SITE_OTHER): Payer: Medicare Other | Admitting: Family Medicine

## 2012-07-20 VITALS — BP 140/78 | HR 54 | Temp 98.8°F | Ht 65.0 in | Wt 185.0 lb

## 2012-07-20 DIAGNOSIS — J069 Acute upper respiratory infection, unspecified: Secondary | ICD-10-CM | POA: Diagnosis not present

## 2012-07-20 MED ORDER — FLUTICASONE PROPIONATE 50 MCG/ACT NA SUSP: NASAL | Status: DC

## 2012-07-20 NOTE — Progress Notes (Signed)
OFFICE NOTE  07/20/2012  CC:  Chief Complaint  Patient presents with  . Sinus Problem    ? sinus infection since last Tuesday; sinus pressure, slight HA, no fever     HPI: Patient is a 67 y.o. Caucasian male who is here for resp sx's.   Pt presents complaining of respiratory symptoms for 6 days.  Primary symptoms are: nasal mucous, some PND and cough, some pressure behind eyes.  No fever.  Lately the symptoms seem to be the same.  Pertinent negatives: No fevers, no wheezing, and no SOB.  No pain in face or teeth.  No significant HA.  ST mild at most.   Symptoms made worse by nothing.  Symptoms improved by nothing (tried mucinex--no diff). Smoker? no Muscle or joint aches? no Flu shot this season at least 2 wks ago? No (declines)  Additional ROS: no n/v/d or abdominal pain.  No rash.  No neck stiffness.   +Mild fatigue.  +Mild appetite loss.   Pertinent PMH:  Past Medical History  Diagnosis Date  . Elevated blood-pressure reading without diagnosis of hypertension   . Anxiety   . Diverticulosis   . Arthritis     L-Spine  . Hypertension   . Cardiomyopathy, nonischemic 10/12/10    Global hypokinesis, EF 40-45% by cath and echo  . Single vessel coronary artery disease 10/12/10    80% OM lesion, med mgmt per cards  . Aortic stenosis, mild 10/12/10    cath and echo  . CARDIAC MURMUR 08/13/2010  . ABNORMAL ELECTROCARDIOGRAM 07/19/2010  . Hx of colonoscopy 2004    normal per patient--repeat 2014.  . TOBACCO ABUSE 08/13/2010  . Bronchitis 02/05/2011  . Obesity, Class I, BMI 30-34.9     MEDS:  Outpatient Prescriptions Prior to Visit  Medication Sig Dispense Refill  . aspirin 81 MG tablet Take 81 mg by mouth daily.        Marland Kitchen atorvastatin (LIPITOR) 20 MG tablet Take 1 tablet (20 mg total) by mouth daily.  30 tablet  5  . FLUoxetine (PROZAC) 10 MG capsule Take 10 mg by mouth daily.        . hydrochlorothiazide (HYDRODIURIL) 25 MG tablet Take 1 tablet (25 mg total) by mouth daily.  30  tablet  1  . lisinopril (PRINIVIL,ZESTRIL) 40 MG tablet Take 1 tablet (40 mg total) by mouth daily.  30 tablet  6  . nebivolol (BYSTOLIC) 10 MG tablet Take 1 tablet (10 mg total) by mouth daily.  30 tablet  5  . albuterol (VENTOLIN HFA) 108 (90 BASE) MCG/ACT inhaler Inhale 2 puffs into the lungs every 4 (four) hours as needed for wheezing.  1 Inhaler  1  . budesonide-formoterol (SYMBICORT) 80-4.5 MCG/ACT inhaler Inhale 2 puffs into the lungs 2 (two) times daily.  1 Inhaler  3   Last reviewed on 07/20/2012 10:52 AM by Jeoffrey Massed, MD  PE: Blood pressure 140/78, pulse 54, temperature 98.8 F (37.1 C), temperature source Temporal, height 5\' 5"  (1.651 m), weight 185 lb (83.915 kg), SpO2 98.00%. VS: noted--normal. Gen: alert, NAD, NONTOXIC APPEARING. HEENT: eyes without injection, drainage, or swelling.  Ears: EACs clear, TMs with normal light reflex and landmarks.  Nose: Clear rhinorrhea, with some dried, crusty exudate adherent to mildly injected mucosa.  No purulent d/c.  No paranasal sinus TTP.  No facial swelling.  Throat and mouth without focal lesion.  No pharyngial swelling, erythema, or exudate.   Neck: supple, no LAD.   LUNGS: CTA  bilat, nonlabored resps.   CV: RRR, 2/6 systolic murmur, without rubs or gallop. EXT: no c/c/e SKIN: no rash   IMPRESSION AND PLAN:  Viral URI No sign of bacterial infection or RAD. Mucinex DM bid prn, allegra 180mg  qd. Flonase trial.     FOLLOW UP: Call in 4d if not better.

## 2012-07-20 NOTE — Patient Instructions (Signed)
Buy generic OTC allegra 180mg  and take one tab daily while you have a cold. Buy OTC mucinex DM and take as directed. Continue to irrigate nose with saline nasal spray. Call the office to report to [place] if not beginning to feel improved in 4d. Call back before then if you are getting worse.

## 2012-07-23 DIAGNOSIS — J069 Acute upper respiratory infection, unspecified: Secondary | ICD-10-CM | POA: Insufficient documentation

## 2012-07-23 NOTE — Assessment & Plan Note (Signed)
No sign of bacterial infection or RAD. Mucinex DM bid prn, allegra 180mg  qd. Flonase trial.

## 2012-07-29 ENCOUNTER — Encounter: Payer: Self-pay | Admitting: Gastroenterology

## 2012-08-03 ENCOUNTER — Other Ambulatory Visit: Payer: Self-pay | Admitting: *Deleted

## 2012-08-03 MED ORDER — ATORVASTATIN CALCIUM 20 MG PO TABS: 20.0000 mg | ORAL_TABLET | Freq: Every day | ORAL | Status: DC

## 2012-08-03 MED ORDER — LISINOPRIL 40 MG PO TABS: 40.0000 mg | ORAL_TABLET | Freq: Every day | ORAL | Status: DC

## 2012-08-03 NOTE — Telephone Encounter (Signed)
Faxed refill request received from pharmacy for ATORVASTATIN Last filled by MD on 01/10/12, #30 X 5  Faxed refill request received from pharmacy for LISINOPRIL Last filled by MD on 12/19/11, #30 X 6 Last seen on 12/20/11 for HTN follow up. Follow up needed. No appt in system.  Note sent with RX that follow up appt is needed. 30 day supply sent.

## 2012-08-07 ENCOUNTER — Encounter: Payer: Self-pay | Admitting: Gastroenterology

## 2012-08-11 ENCOUNTER — Other Ambulatory Visit: Payer: Self-pay | Admitting: *Deleted

## 2012-08-11 MED ORDER — ATORVASTATIN CALCIUM 20 MG PO TABS: 20.0000 mg | ORAL_TABLET | Freq: Every day | ORAL | Status: DC

## 2012-08-11 MED ORDER — LISINOPRIL 40 MG PO TABS: 40.0000 mg | ORAL_TABLET | Freq: Every day | ORAL | Status: DC

## 2012-08-11 MED ORDER — HYDROCHLOROTHIAZIDE 25 MG PO TABS: 25.0000 mg | ORAL_TABLET | Freq: Every day | ORAL | Status: DC

## 2012-08-11 NOTE — Telephone Encounter (Signed)
Pt is transferring pharmacies.  No refills remaining at first pharmacy.  PC from [facility] requesting refills.  Refill request for ATORVASTATIN Last filled- 08/03/12 at [facility]  Refill request for LISINOPRIL Last filled- 08/03/12 at [facility]  Refill request for HCTZ Last filled- 07/03/12, #30 x 1 at [facility]  Last seen on 12/20/11 for HTN follow up.  Follow up needed. No appt in system. Note sent with RX that follow up appt is needed for more refills. 30 day supply sent to [facility].

## 2012-09-25 ENCOUNTER — Ambulatory Visit (AMBULATORY_SURGERY_CENTER): Payer: Medicare Other | Admitting: *Deleted

## 2012-09-25 VITALS — Ht 65.0 in | Wt 182.6 lb

## 2012-09-25 DIAGNOSIS — Z1211 Encounter for screening for malignant neoplasm of colon: Secondary | ICD-10-CM

## 2012-09-25 MED ORDER — NA SULFATE-K SULFATE-MG SULF 17.5-3.13-1.6 GM/177ML PO SOLN: ORAL | Status: DC

## 2012-10-06 HISTORY — PX: COLONOSCOPY W/ POLYPECTOMY: SHX1380

## 2012-10-09 ENCOUNTER — Ambulatory Visit (AMBULATORY_SURGERY_CENTER): Payer: Medicare Other | Admitting: Gastroenterology

## 2012-10-09 ENCOUNTER — Encounter: Payer: Self-pay | Admitting: Gastroenterology

## 2012-10-09 VITALS — BP 143/73 | HR 50 | Temp 97.6°F | Resp 16 | Ht 65.0 in | Wt 182.0 lb

## 2012-10-09 DIAGNOSIS — D126 Benign neoplasm of colon, unspecified: Secondary | ICD-10-CM

## 2012-10-09 DIAGNOSIS — Z1211 Encounter for screening for malignant neoplasm of colon: Secondary | ICD-10-CM

## 2012-10-09 DIAGNOSIS — J449 Chronic obstructive pulmonary disease, unspecified: Secondary | ICD-10-CM | POA: Diagnosis not present

## 2012-10-09 DIAGNOSIS — I428 Other cardiomyopathies: Secondary | ICD-10-CM | POA: Diagnosis not present

## 2012-10-09 DIAGNOSIS — K5730 Diverticulosis of large intestine without perforation or abscess without bleeding: Secondary | ICD-10-CM | POA: Diagnosis not present

## 2012-10-09 MED ORDER — SODIUM CHLORIDE 0.9 % IV SOLN: 500.0000 mL | INTRAVENOUS | Status: DC

## 2012-10-09 NOTE — Op Note (Signed)
[facility] [address] [place], [address]   COLONOSCOPY PROCEDURE REPORT  PATIENT: Anthony, Daugherty  MR#: [id number] BIRTHDATE: 12-05-1945 , 67  yrs. old GENDER: Male ENDOSCOPIST: Louis Meckel, MD REFERRED [id number] Milinda Cave, M.D. PROCEDURE DATE:  10/09/2012 PROCEDURE:   Colonoscopy with snare polypectomy and Colonoscopy with cold biopsy polypectomy ASA CLASS:   Class II INDICATIONS:average risk screening. MEDICATIONS: MAC sedation, administered by CRNA and propofol (Diprivan) 350mg  IV  DESCRIPTION OF PROCEDURE:   After the risks benefits and alternatives of the procedure were thoroughly explained, informed consent was obtained.  A digital rectal exam revealed no abnormalities of the rectum.   The LB CF-[id number]  endoscope was introduced through the anus and advanced to the cecum, which was identified by both the appendix and ileocecal valve. No adverse events experienced.   The quality of the prep was Suprep good  The instrument was then slowly withdrawn as the colon was fully examined.      COLON FINDINGS: A sessile polyp was found in the descending colon. A polypectomy was performed.  The resection was complete and the polyp tissue was completely retrieved.   A sessile polyp measuring 2 mm in size was found in the sigmoid colon.  A polypectomy was performed with cold forceps.  The resection was complete and the polyp tissue was completely retrieved.   Mild diverticulosis was noted in the sigmoid colon and descending colon.   The colon mucosa was otherwise normal.  Retroflexed views revealed no abnormalities. The time to cecum=5 minutes 13 seconds.  Withdrawal time=10 minutes 43 seconds.  The scope was withdrawn and the procedure completed. COMPLICATIONS: There were no complications.  ENDOSCOPIC IMPRESSION: 1.   Sessile polyp was found in the descending colon; polypectomy was performed 2.   Sessile polyp measuring 2 mm in size was  found in the sigmoid colon; polypectomy was performed with cold forceps 3.   Mild diverticulosis was noted in the sigmoid colon and descending colon 4.   The colon mucosa was otherwise normal  RECOMMENDATIONS: If the polyp(s) removed today are proven to be adenomatous (pre-cancerous) polyps, you will need a repeat colonoscopy in 5 years.  Otherwise you should continue to follow colorectal cancer screening guidelines for "routine risk" patients with colonoscopy in 10 years.  You will receive a letter within 1-2 weeks with the results of your biopsy as well as final recommendations.  Please call my office if you have not received a letter after 3 weeks.   eSigned:  Louis Meckel, MD 10/09/2012 11:03 AM   cc:   PATIENT NAME:  Anthony, Daugherty MR#: [id number]

## 2012-10-09 NOTE — Progress Notes (Signed)
Patient did not experience any of the following events: a burn prior to discharge; a fall within the facility; wrong site/side/patient/procedure/implant event; or a hospital transfer or hospital admission upon discharge from the facility. (G8907) Patient did not have preoperative order for IV antibiotic SSI prophylaxis. (G8918)  

## 2012-10-09 NOTE — Patient Instructions (Addendum)

## 2012-10-09 NOTE — Progress Notes (Signed)
Called to room to assist during endoscopic procedure.  Patient ID and intended procedure confirmed with present staff. Received instructions for my participation in the procedure from the performing physician.  

## 2012-10-12 ENCOUNTER — Telehealth: Payer: Self-pay

## 2012-10-12 NOTE — Telephone Encounter (Signed)
  Follow up Call-  Call back number 10/09/2012  Post procedure Call Back phone  # [phone]  Permission to leave phone message Yes     Patient questions:  Do you have a fever, pain , or abdominal swelling? no Pain Score  0 *  Have you tolerated food without any problems? yes  Have you been able to return to your normal activities? yes  Do you have any questions about your discharge instructions: Diet   no Medications  no Follow up visit  no  Do you have questions or concerns about your Care? no  Actions: * If pain score is 4 or above: No action needed, pain <4.

## 2012-10-16 ENCOUNTER — Telehealth: Payer: Self-pay | Admitting: Family Medicine

## 2012-10-16 ENCOUNTER — Encounter: Payer: Self-pay | Admitting: Gastroenterology

## 2012-10-16 MED ORDER — LISINOPRIL 40 MG PO TABS: 40.0000 mg | ORAL_TABLET | Freq: Every day | ORAL | Status: DC

## 2012-10-16 MED ORDER — ATORVASTATIN CALCIUM 20 MG PO TABS: 20.0000 mg | ORAL_TABLET | Freq: Every day | ORAL | Status: DC

## 2012-10-16 NOTE — Telephone Encounter (Signed)
Refill request for LISINOPRIL Last filled- 08/11/12, 30 x 0  Refill request for ATORVASTATIN Last filled- 08/11/12, #30 x 0 Last seen- 12/20/11 Follow up - NEEDED.  Pt was advised by Diane that he needs appt and 30 day supply will be sent.  Pt will be seen next Friday. 30 day supply sent.

## 2012-10-16 NOTE — Telephone Encounter (Signed)
Patient is in the lobby. Does he need an OV?

## 2012-10-17 ENCOUNTER — Other Ambulatory Visit: Payer: Self-pay | Admitting: Family Medicine

## 2012-10-19 ENCOUNTER — Encounter: Payer: Self-pay | Admitting: *Deleted

## 2012-10-19 ENCOUNTER — Encounter: Payer: Self-pay | Admitting: Family Medicine

## 2012-10-20 NOTE — Telephone Encounter (Signed)
eScribe request for refill on HCTZ  Last filled - 08/11/12, #30 X 0 Last seen- 12/20/11  Follow up - NEEDED. Pt will be seen 10/23/12 30 day supply sent.

## 2012-10-23 ENCOUNTER — Ambulatory Visit (INDEPENDENT_AMBULATORY_CARE_PROVIDER_SITE_OTHER): Payer: Medicare Other | Admitting: Family Medicine

## 2012-10-23 ENCOUNTER — Encounter: Payer: Self-pay | Admitting: Family Medicine

## 2012-10-23 VITALS — BP 132/65 | HR 52 | Temp 98.2°F | Ht 65.0 in | Wt 180.1 lb

## 2012-10-23 DIAGNOSIS — E785 Hyperlipidemia, unspecified: Secondary | ICD-10-CM | POA: Diagnosis not present

## 2012-10-23 DIAGNOSIS — I10 Essential (primary) hypertension: Secondary | ICD-10-CM | POA: Diagnosis not present

## 2012-10-23 DIAGNOSIS — I350 Nonrheumatic aortic (valve) stenosis: Secondary | ICD-10-CM

## 2012-10-23 DIAGNOSIS — I359 Nonrheumatic aortic valve disorder, unspecified: Secondary | ICD-10-CM | POA: Diagnosis not present

## 2012-10-23 HISTORY — DX: Hyperlipidemia, unspecified: E78.5

## 2012-10-23 LAB — COMPREHENSIVE METABOLIC PANEL
AST: 19 U/L (ref 0–37)
Albumin: 4.4 g/dL (ref 3.5–5.2)
Alkaline Phosphatase: 58 U/L (ref 39–117)
Chloride: 100 mEq/L (ref 96–112)
Glucose, Bld: 60 mg/dL — ABNORMAL LOW (ref 70–99)
Potassium: 4.4 mEq/L (ref 3.5–5.1)
Sodium: 133 mEq/L — ABNORMAL LOW (ref 135–145)
Total Protein: 6.9 g/dL (ref 6.0–8.3)

## 2012-10-23 LAB — LIPID PANEL: VLDL: 19.6 mg/dL (ref 0.0–40.0)

## 2012-10-23 NOTE — Assessment & Plan Note (Signed)
He is asymptomatic. His cardiac exam is unchanged.

## 2012-10-23 NOTE — Progress Notes (Signed)
OFFICE NOTE  10/23/2012  CC:  Chief Complaint  Patient presents with  . Follow-up    fasting     HPI: Patient is a 67 y.o. Caucasian male who is here for f/u HTN. Says he feels great.  No exertional SOB or chest pain or dizziness.  He is working full time. Compliant with meds.  No home bps to report. He is still slowly trying to eat a heart healthy diet.  Pertinent PMH:  Past Medical History  Diagnosis Date  . Elevated blood-pressure reading without diagnosis of hypertension   . Anxiety   . Diverticulosis   . Arthritis     L-Spine  . Hypertension   . Cardiomyopathy, nonischemic 10/12/10    Global hypokinesis, EF 40-45% by cath and echo  . Single vessel coronary artery disease 10/12/10    80% OM lesion, med mgmt per cards  . Aortic stenosis, mild 10/12/10    cath and echo  . CARDIAC MURMUR 08/13/2010  . ABNORMAL ELECTROCARDIOGRAM 07/19/2010  . Hx of colonoscopy 2004; 2014    Initial was normal; recall TCS--polypectomy and diverticulosis  . TOBACCO ABUSE 08/13/2010  . Bronchitis 02/05/2011  . Obesity, Class I, BMI 30-34.9    Past Surgical History  Procedure Laterality Date  . Cardiac catheterization  10/12/10    diagnostic only: 1 vessel CAD with TIMI 3 flow, EF 40-45%.  Mild AS.  . Transthoracic echocardiogram  09/2010  . Colonoscopy w/ polypectomy  10/06/12    Diverticulosis and tubular adenoma w/out high grade dysplasia.  Repeat 5 yrs (Dr. Arlyce Dice).   Past family and social history reviewed and there are no changes since the patient's last office visit with me.  MEDS:  Outpatient Prescriptions Prior to Visit  Medication Sig Dispense Refill  . aspirin 81 MG tablet Take 81 mg by mouth daily.        Marland Kitchen atorvastatin (LIPITOR) 20 MG tablet Take 1 tablet (20 mg total) by mouth daily. Must make appt for more refills.  30 tablet  0  . FLUoxetine (PROZAC) 10 MG capsule Take 10 mg by mouth daily.        . hydrochlorothiazide (HYDRODIURIL) 25 MG tablet Take 1 tablet (25 mg total) by  mouth daily.  30 tablet  0  . lisinopril (PRINIVIL,ZESTRIL) 40 MG tablet Take 1 tablet (40 mg total) by mouth daily. Needs appt for additional refills.  30 tablet  0  . nebivolol (BYSTOLIC) 10 MG tablet Take 1 tablet (10 mg total) by mouth daily.  30 tablet  5   No facility-administered medications prior to visit.    PE: Blood pressure 132/65, pulse 52, temperature 98.2 F (36.8 C), temperature source Temporal, height 5\' 5"  (1.651 m), weight 180 lb 1.9 oz (81.702 kg), SpO2 98.00%. Gen: Alert, well appearing.  Patient is oriented to person, place, time, and situation. ENT:  Eyes: no injection, icteris, swelling, or exudate.  EOMI, PERRLA. Nose: no drainage or turbinate edema/swelling.  No injection or focal lesion.  Mouth: lips without lesion/swelling.  Oral mucosa pink and moist.   Neck: supple/nontender.  No LAD, mass, or TM.  Carotid pulses 2+ bilaterally, without bruits.  His aortic murmur transmits very softly to both carotids. CV: RRR, 2/6 systolic murmur heard best at RUSB.  S1 more distinct than S2.  No rub or gallop. LUNGS: CTA bilat, nonlabored resps, good aeration in all lung fields. EXT: no clubbing, cyanosis, or edema.   IMPRESSION AND PLAN:  HTN (hypertension), benign Problem stable.  Continue current medications and diet appropriate for this condition.  We have reviewed our general long term plan for this problem and also reviewed symptoms and signs that should prompt the patient to call or return to the office. Cr/lytes today.   Dyslipidemia On statin. Check FLP today + AST/ALT.  Aortic stenosis, mild He is asymptomatic. His cardiac exam is unchanged.     FOLLOW UP: 26mo for fasting CPE

## 2012-10-23 NOTE — Assessment & Plan Note (Signed)
On statin. Check FLP today + AST/ALT.

## 2012-10-23 NOTE — Assessment & Plan Note (Addendum)
Problem stable.  Continue current medications and diet appropriate for this condition.  We have reviewed our general long term plan for this problem and also reviewed symptoms and signs that should prompt the patient to call or return to the office. Cr/lytes today.

## 2012-11-10 ENCOUNTER — Other Ambulatory Visit: Payer: Self-pay | Admitting: Family Medicine

## 2012-11-11 NOTE — Telephone Encounter (Signed)
Rx request to pharmacy/SLS  

## 2012-11-15 ENCOUNTER — Other Ambulatory Visit: Payer: Self-pay | Admitting: Family Medicine

## 2012-11-16 NOTE — Telephone Encounter (Signed)
Refill request for Hydrochlorothiazide Last filled-10/20/12, #30 x30 Last seen-10/23/12 Follow up -04/23/13 Please advise refill?

## 2013-03-27 DIAGNOSIS — G44209 Tension-type headache, unspecified, not intractable: Secondary | ICD-10-CM | POA: Diagnosis not present

## 2013-03-27 DIAGNOSIS — H2510 Age-related nuclear cataract, unspecified eye: Secondary | ICD-10-CM | POA: Diagnosis not present

## 2013-04-23 ENCOUNTER — Encounter: Payer: Self-pay | Admitting: Family Medicine

## 2013-04-23 ENCOUNTER — Ambulatory Visit (INDEPENDENT_AMBULATORY_CARE_PROVIDER_SITE_OTHER): Payer: Medicare Other | Admitting: Family Medicine

## 2013-04-23 ENCOUNTER — Encounter: Payer: Self-pay | Admitting: Emergency Medicine

## 2013-04-23 VITALS — BP 152/73 | HR 51 | Temp 98.6°F | Resp 18 | Ht 65.0 in | Wt 181.0 lb

## 2013-04-23 DIAGNOSIS — Z0389 Encounter for observation for other suspected diseases and conditions ruled out: Secondary | ICD-10-CM | POA: Diagnosis not present

## 2013-04-23 DIAGNOSIS — E041 Nontoxic single thyroid nodule: Secondary | ICD-10-CM

## 2013-04-23 DIAGNOSIS — R05 Cough: Secondary | ICD-10-CM

## 2013-04-23 DIAGNOSIS — Z125 Encounter for screening for malignant neoplasm of prostate: Secondary | ICD-10-CM

## 2013-04-23 DIAGNOSIS — Z0000 Encounter for general adult medical examination without abnormal findings: Secondary | ICD-10-CM | POA: Diagnosis not present

## 2013-04-23 DIAGNOSIS — Z23 Encounter for immunization: Secondary | ICD-10-CM | POA: Diagnosis not present

## 2013-04-23 DIAGNOSIS — R6889 Other general symptoms and signs: Secondary | ICD-10-CM | POA: Diagnosis not present

## 2013-04-23 DIAGNOSIS — R059 Cough, unspecified: Secondary | ICD-10-CM | POA: Insufficient documentation

## 2013-04-23 HISTORY — DX: Nontoxic single thyroid nodule: E04.1

## 2013-04-23 LAB — PSA, MEDICARE: PSA: 1.28 ng/ml (ref 0.10–4.00)

## 2013-04-23 LAB — BASIC METABOLIC PANEL
BUN: 16 mg/dL (ref 6–23)
Chloride: 101 mEq/L (ref 96–112)
GFR: 93.13 mL/min (ref >60.00)
Glucose, Bld: 84 mg/dL (ref 70–99)
Potassium: 4.7 mEq/L (ref 3.5–5.1)
Sodium: 138 mEq/L (ref 135–145)

## 2013-04-23 LAB — TSH: TSH: 1.42 u[IU]/mL (ref 0.35–5.50)

## 2013-04-23 MED ORDER — IRBESARTAN 150 MG PO TABS: 150.0000 mg | ORAL_TABLET | Freq: Every day | ORAL | Status: DC

## 2013-04-23 NOTE — Assessment & Plan Note (Signed)
Reviewed age and gender appropriate health maintenance issues (prudent diet, regular exercise, health risks of tobacco and excessive alcohol, use of seatbelts, fire alarms in home, use of sunscreen).  Also reviewed age and gender appropriate health screening as well as vaccine recommendations. DRE normal.  PSA drawn today. UTD on vaccines. Colon cancer screening UTD.

## 2013-04-23 NOTE — Addendum Note (Signed)
Addended by: Eulah Pont on: 04/23/2013 09:51 AM   Modules accepted: Orders

## 2013-04-23 NOTE — Assessment & Plan Note (Signed)
With cold intolerance in the mornings. Will check TSH and do soft tissue neck u/s to further evaluate.

## 2013-04-23 NOTE — Assessment & Plan Note (Signed)
Suspect ACE-I side effect. Will d/c lisinopril and start generic avapro 150mg  qd. Continue home bp monitoring and call if persistently above 140/90.

## 2013-04-23 NOTE — Addendum Note (Signed)
Addended by: Baldemar Lenis R on: 04/23/2013 09:57 AM   Modules accepted: Orders

## 2013-04-23 NOTE — Progress Notes (Signed)
Office Note 04/23/2013  CC:  Chief Complaint  Patient presents with  . Annual Exam    HPI:  Anthony Daugherty is a 67 y.o. White male who is here for CPE. Feeling well.  He mentions a few minor complaints: urinary frequency and some urgency but no trouble getting stream/flow started.  Some incomplete emptying sensation in mornings and avg nocturia 3-4/night, sometimes up to 6 times.  He does not feel impaired by his sx's, doesn't feel like it leads to poor sleep/rest at night.  He is not interested in med treatment at this time.  He is interested in prostate cancer screening.  He has ED since being on bystolic, says libido is intact.  Was on fluoxetine prior to this--says he never had a problem with this med causing ED.  He has chronic mild, dry, "tickly" cough, slight chronic runny nose.  No HA, ST, or chest pains or wheezing or SOB.   Past Medical History  Diagnosis Date  . Anxiety   . Diverticulosis   . Arthritis     L-Spine  . Hypertension   . Cardiomyopathy, nonischemic 10/12/10    Global hypokinesis, EF 40-45% by cath and echo  . Single vessel coronary artery disease 10/12/10    80% OM lesion, med mgmt per cards  . Aortic stenosis, mild 10/12/10    cath and echo  . CARDIAC MURMUR 08/13/2010  . ABNORMAL ELECTROCARDIOGRAM 07/19/2010  . Hx of colonoscopy 2004; 2014    Initial was normal; recall TCS--polypectomy and diverticulosis  . TOBACCO ABUSE 08/13/2010  . Bronchitis 02/05/2011  . Obesity, Class I, BMI 30-34.9     Past Surgical History  Procedure Laterality Date  . Cardiac catheterization  10/12/10    diagnostic only: 1 vessel CAD with TIMI 3 flow, EF 40-45%.  Mild AS.  . Transthoracic echocardiogram  09/2010  . Colonoscopy w/ polypectomy  10/06/12    Diverticulosis and tubular adenoma w/out high grade dysplasia.  Repeat 5 yrs (Dr. Arlyce Dice).    Family History  Problem Relation Age of Onset  . Alzheimer's disease Mother   . COPD Father   . Heart failure Father      congestive  . Other Father     CHF  . Congenital heart disease Brother     cardiomyopathy  . Other Brother     CHF/ cardiolyopathy  . Colon cancer Neg Hx   . Esophageal cancer Neg Hx   . Stomach cancer Neg Hx   . Rectal cancer Neg Hx     History   Social History  . Marital Status: Married    Spouse Name: Liborio Nixon    Number of Children: 2  . Years of Education: N/A   Occupational History  . Delivery Driver    Social History Main Topics  . Smoking status: Former Smoker    Quit date: 09/25/2009  . Smokeless tobacco: Current User    Types: Chew     Comment: chews tobacco  . Alcohol Use: 4.8 oz/week    8 Cans of beer per week  . Drug Use: No  . Sexual Activity: Yes   Other Topics Concern  . Not on file   Social History Narrative   Delivery driver.  Married, two children.   Long hx of smoking but quit after 2010.    Outpatient Prescriptions Prior to Visit  Medication Sig Dispense Refill  . aspirin 81 MG tablet Take 81 mg by mouth daily.        Marland Kitchen  atorvastatin (LIPITOR) 20 MG tablet Take 1 tablet (20 mg total) by mouth daily.  30 tablet  5  . BYSTOLIC 10 MG tablet TAKE ONE TABLET BY MOUTH EVERY DAY  30 tablet  5  . FLUoxetine (PROZAC) 10 MG capsule Take 10 mg by mouth daily.        . hydrochlorothiazide (HYDRODIURIL) 25 MG tablet TAKE ONE TABLET BY MOUTH ONCE DAILY  30 tablet  7  . lisinopril (PRINIVIL,ZESTRIL) 40 MG tablet Take 1 tablet (40 mg total) by mouth daily.  30 tablet  5  . fluticasone (FLONASE) 50 MCG/ACT nasal spray       . SUPREP BOWEL PREP SOLN        No facility-administered medications prior to visit.    Allergies  Allergen Reactions  . Penicillins Swelling    ROS Review of Systems  Constitutional: Positive for fatigue (mild, chronic). Negative for fever, chills and appetite change.  HENT: Negative for congestion, dental problem, ear pain and sore throat.   Eyes: Negative for discharge, redness and visual disturbance.  Respiratory: Positive  for cough (dry). Negative for chest tightness, shortness of breath and wheezing.   Cardiovascular: Negative for chest pain, palpitations and leg swelling.  Gastrointestinal: Negative for nausea, vomiting, abdominal pain, diarrhea and blood in stool.  Endocrine: Positive for cold intolerance (mainly in the mornings).  Genitourinary: Positive for urgency and frequency. Negative for dysuria, hematuria, flank pain and difficulty urinating.       See HPI  Musculoskeletal: Negative for arthralgias, back pain, joint swelling, myalgias and neck stiffness.  Skin: Negative for pallor and rash.  Neurological: Negative for dizziness, speech difficulty, weakness and headaches.  Hematological: Negative for adenopathy. Does not bruise/bleed easily.  Psychiatric/Behavioral: Negative for confusion and sleep disturbance. The patient is not nervous/anxious.      PE; Blood pressure 152/73, pulse 51, temperature 98.6 F (37 C), temperature source Temporal, resp. rate 18, height 5\' 5"  (1.651 m), weight 181 lb (82.101 kg), SpO2 99.00%. Gen: Alert, well appearing.  Patient is oriented to person, place, time, and situation. AFFECT: pleasant, lucid thought and speech. ENT: Ears: EACs clear, normal epithelium.  TMs with good light reflex and landmarks bilaterally.  Eyes: no injection, icteris, swelling, or exudate.  EOMI, PERRLA. Nose: no drainage or turbinate edema/swelling.  No injection or focal lesion.  Mouth: lips without lesion/swelling.  Oral mucosa pink and moist.  Dentition intact and without obvious caries or gingival swelling.  Oropharynx without erythema, exudate, or swelling.  Neck: supple/nontender.  No LAD, mass, or TM.  Carotid pulses 2+ bilaterally, without bruits. CV: RRR, 2/6 syst murmur with normal S1 and S2, no diastolic murmur.  No r/g.   LUNGS: CTA bilat, nonlabored resps, good aeration in all lung fields. ABD: soft, NT, ND, BS normal.  No hepatospenomegaly or mass.  No bruits. EXT: no clubbing,  cyanosis, or edema.  Musculoskeletal: no joint swelling, erythema, warmth, or tenderness.  ROM of all joints intact. Skin - no sores or suspicious lesions or rashes or color changes Rectal exam: negative without mass, lesions or tenderness, PROSTATE EXAM: smooth and symmetric without nodules or tenderness, enlarged 2+.    Pertinent labs:  None today.  Lab Results  Component Value Date   CHOL 139 10/23/2012   HDL 54.00 10/23/2012   LDLCALC 65 10/23/2012   TRIG 98.0 10/23/2012   CHOLHDL 3 10/23/2012   Lab Results  Component Value Date   WBC 3.5* 10/05/2010   HGB 14.5 10/05/2010  HCT 40.9 10/05/2010   MCV 94.7 10/05/2010   PLT 188.0 10/05/2010     Chemistry      Component Value Date/Time   NA 133* 10/23/2012 0827   K 4.4 10/23/2012 0827   CL 100 10/23/2012 0827   CO2 25 10/23/2012 0827   BUN 16 10/23/2012 0827   CREATININE 0.9 10/23/2012 0827      Component Value Date/Time   CALCIUM 8.8 10/23/2012 0827   ALKPHOS 58 10/23/2012 0827   AST 19 10/23/2012 0827   ALT 18 10/23/2012 0827   BILITOT 0.8 10/23/2012 0827     Lab Results  Component Value Date   TSH 2.32 07/19/2010     ASSESSMENT AND PLAN:   Health maintenance examination Reviewed age and gender appropriate health maintenance issues (prudent diet, regular exercise, health risks of tobacco and excessive alcohol, use of seatbelts, fire alarms in home, use of sunscreen).  Also reviewed age and gender appropriate health screening as well as vaccine recommendations. DRE normal.  PSA drawn today. UTD on vaccines. Colon cancer screening UTD.  Thyroid nodule With cold intolerance in the mornings. Will check TSH and do soft tissue neck u/s to further evaluate.  Cough Suspect ACE-I side effect. Will d/c lisinopril and start generic avapro 150mg  qd. Continue home bp monitoring and call if persistently above 140/90.   An After Visit Summary was printed and given to the patient.  Flu vaccine IM today.  FOLLOW UP:  Return in about  6 months (around 10/22/2013) for f/u HTN.

## 2013-04-30 ENCOUNTER — Ambulatory Visit
Admission: RE | Admit: 2013-04-30 | Discharge: 2013-04-30 | Disposition: A | Discharge status: Home / Self Care | Payer: Medicare Other | Source: Ambulatory Visit | Attending: Family Medicine | Admitting: Family Medicine

## 2013-04-30 DIAGNOSIS — E041 Nontoxic single thyroid nodule: Secondary | ICD-10-CM

## 2013-04-30 DIAGNOSIS — E079 Disorder of thyroid, unspecified: Secondary | ICD-10-CM | POA: Diagnosis not present

## 2013-05-18 ENCOUNTER — Other Ambulatory Visit: Payer: Self-pay | Admitting: Family Medicine

## 2013-05-18 MED ORDER — NEBIVOLOL HCL 10 MG PO TABS: ORAL_TABLET | ORAL | Status: DC

## 2013-05-21 ENCOUNTER — Other Ambulatory Visit: Payer: Self-pay | Admitting: Family Medicine

## 2013-05-21 MED ORDER — ATORVASTATIN CALCIUM 20 MG PO TABS: 20.0000 mg | ORAL_TABLET | Freq: Every day | ORAL | Status: DC

## 2013-05-27 ENCOUNTER — Encounter: Payer: Self-pay | Admitting: Family Medicine

## 2013-05-27 ENCOUNTER — Ambulatory Visit (INDEPENDENT_AMBULATORY_CARE_PROVIDER_SITE_OTHER): Payer: Medicare Other | Admitting: Family Medicine

## 2013-05-27 VITALS — BP 110/65 | HR 66 | Temp 99.3°F | Resp 18 | Ht 65.0 in | Wt 183.0 lb

## 2013-05-27 DIAGNOSIS — M109 Gout, unspecified: Secondary | ICD-10-CM

## 2013-05-27 HISTORY — DX: Gout, unspecified: M10.9

## 2013-05-27 MED ORDER — HYDROCODONE-ACETAMINOPHEN 5-325 MG PO TABS: ORAL_TABLET | ORAL | Status: DC

## 2013-05-27 MED ORDER — PREDNISONE 20 MG PO TABS: ORAL_TABLET | ORAL | Status: DC

## 2013-05-27 NOTE — Assessment & Plan Note (Signed)
Prednisone 40mg  qd x 5d. Vicodin 5/325, 1-2 q6h prn. Low purine diet handout given. Signs/symptoms to call or return for were reviewed and pt expressed understanding.  If recurrent, will then proceed with uric acid testing and consideration of colcrys vs xanthine oxidase inhibitor.

## 2013-05-27 NOTE — Progress Notes (Signed)
OFFICE NOTE  05/27/2013  CC:  Chief Complaint  Patient presents with  . Toe Pain    left foot x 2 days     HPI: Patient is a 67 y.o. Caucasian male who is here for toe pain. Onset 2 d/a, distal left toe, intensity 5/10.  Worse when touched or when wt bearing and walking on it. Took 2 ASA this morning and this didn't affect the pain any. No f/c or malaise.  He thinks he had something similar once in the past but not as intense.   Pertinent PMH:  Past Medical History  Diagnosis Date  . Anxiety   . Diverticulosis   . Arthritis     L-Spine  . Hypertension   . Cardiomyopathy, nonischemic 10/12/10    Global hypokinesis, EF 40-45% by cath and echo  . Single vessel coronary artery disease 10/12/10    80% OM lesion, med mgmt per cards  . Aortic stenosis, mild 10/12/10    cath and echo  . CARDIAC MURMUR 08/13/2010  . ABNORMAL ELECTROCARDIOGRAM 07/19/2010  . Hx of colonoscopy 2004; 2014    Initial was normal; recall TCS--polypectomy and diverticulosis  . TOBACCO ABUSE 08/13/2010  . Bronchitis 02/05/2011  . Obesity, Class I, BMI 30-34.9    Past Surgical History  Procedure Laterality Date  . Cardiac catheterization  10/12/10    diagnostic only: 1 vessel CAD with TIMI 3 flow, EF 40-45%.  Mild AS.  . Transthoracic echocardiogram  09/2010  . Colonoscopy w/ polypectomy  10/06/12    Diverticulosis and tubular adenoma w/out high grade dysplasia.  Repeat 5 yrs (Dr. Arlyce Dice).    MEDS:  Outpatient Prescriptions Prior to Visit  Medication Sig Dispense Refill  . aspirin 81 MG tablet Take 81 mg by mouth daily.        Marland Kitchen atorvastatin (LIPITOR) 20 MG tablet Take 1 tablet (20 mg total) by mouth daily.  30 tablet  3  . FLUoxetine (PROZAC) 10 MG capsule Take 10 mg by mouth daily.        . hydrochlorothiazide (HYDRODIURIL) 25 MG tablet TAKE ONE TABLET BY MOUTH ONCE DAILY  30 tablet  7  . irbesartan (AVAPRO) 150 MG tablet Take 1 tablet (150 mg total) by mouth at bedtime.  30 tablet  6  . nebivolol  (BYSTOLIC) 10 MG tablet TAKE ONE TABLET BY MOUTH EVERY DAY  30 tablet  3   No facility-administered medications prior to visit.    PE: Blood pressure 110/65, pulse 66, temperature 99.3 F (37.4 C), temperature source Temporal, resp. rate 18, height 5\' 5"  (1.651 m), weight 183 lb (83.008 kg), SpO2 95.00%. Gen: Alert, well appearing.  Patient is oriented to person, place, time, and situation. Left foot: MTP and IP joint on great toe are swollen and erythematous and warm and tender. He can flex/extend toe some.  No streaking.  IMPRESSION AND PLAN:  Gouty arthritis of toe of left foot Prednisone 40mg  qd x 5d. Vicodin 5/325, 1-2 q6h prn. Low purine diet handout given. Signs/symptoms to call or return for were reviewed and pt expressed understanding.  If recurrent, will then proceed with uric acid testing and consideration of colcrys vs xanthine oxidase inhibitor.  An After Visit Summary was printed and given to the patient.  FOLLOW UP: prn

## 2013-06-03 DIAGNOSIS — H2510 Age-related nuclear cataract, unspecified eye: Secondary | ICD-10-CM | POA: Diagnosis not present

## 2013-08-25 ENCOUNTER — Other Ambulatory Visit: Payer: Self-pay | Admitting: Family Medicine

## 2013-08-25 MED ORDER — HYDROCHLOROTHIAZIDE 25 MG PO TABS: ORAL_TABLET | ORAL | Status: DC

## 2013-09-01 ENCOUNTER — Other Ambulatory Visit: Payer: Self-pay

## 2013-09-20 ENCOUNTER — Other Ambulatory Visit: Payer: Self-pay | Admitting: Family Medicine

## 2013-09-20 MED ORDER — NEBIVOLOL HCL 10 MG PO TABS: ORAL_TABLET | ORAL | Status: DC

## 2013-10-12 ENCOUNTER — Other Ambulatory Visit: Payer: Self-pay | Admitting: Family Medicine

## 2013-10-12 MED ORDER — ATORVASTATIN CALCIUM 20 MG PO TABS: 20.0000 mg | ORAL_TABLET | Freq: Every day | ORAL | Status: DC

## 2013-10-22 ENCOUNTER — Telehealth: Payer: Self-pay | Admitting: Family Medicine

## 2013-10-22 ENCOUNTER — Ambulatory Visit (INDEPENDENT_AMBULATORY_CARE_PROVIDER_SITE_OTHER): Payer: Medicare Other | Admitting: Family Medicine

## 2013-10-22 ENCOUNTER — Encounter: Payer: Self-pay | Admitting: Family Medicine

## 2013-10-22 VITALS — BP 143/72 | HR 53 | Temp 98.7°F | Resp 18 | Ht 65.0 in | Wt 178.0 lb

## 2013-10-22 DIAGNOSIS — M109 Gout, unspecified: Secondary | ICD-10-CM

## 2013-10-22 DIAGNOSIS — Z0000 Encounter for general adult medical examination without abnormal findings: Secondary | ICD-10-CM

## 2013-10-22 DIAGNOSIS — S39012A Strain of muscle, fascia and tendon of lower back, initial encounter: Secondary | ICD-10-CM

## 2013-10-22 DIAGNOSIS — I10 Essential (primary) hypertension: Secondary | ICD-10-CM | POA: Diagnosis not present

## 2013-10-22 DIAGNOSIS — S335XXA Sprain of ligaments of lumbar spine, initial encounter: Secondary | ICD-10-CM

## 2013-10-22 DIAGNOSIS — E785 Hyperlipidemia, unspecified: Secondary | ICD-10-CM

## 2013-10-22 LAB — BASIC METABOLIC PANEL
BUN: 17 mg/dL (ref 6–23)
CO2: 31 mEq/L (ref 19–32)
Calcium: 9.5 mg/dL (ref 8.4–10.5)
Chloride: 103 mEq/L (ref 96–112)
Creatinine, Ser: 0.9 mg/dL (ref 0.4–1.5)
GFR: 95.52 mL/min (ref >60.00)
Glucose, Bld: 100 mg/dL — ABNORMAL HIGH (ref 70–99)
Potassium: 4.1 mEq/L (ref 3.5–5.1)
Sodium: 138 mEq/L (ref 135–145)

## 2013-10-22 LAB — URIC ACID: Uric Acid, Serum: 7.2 mg/dL (ref 4.0–7.8)

## 2013-10-22 MED ORDER — COLCHICINE 0.6 MG PO TABS: ORAL_TABLET | ORAL | Status: DC

## 2013-10-22 MED ORDER — COLCHICINE 0.6 MG PO TABS: 0.6000 mg | ORAL_TABLET | Freq: Every day | ORAL | Status: DC

## 2013-10-22 MED ORDER — COLCHICINE-PROBENECID 0.5-500 MG PO TABS: ORAL_TABLET | ORAL | Status: DC

## 2013-10-22 NOTE — Progress Notes (Signed)
Pre visit review using our clinic review tool, if applicable. No additional management support is needed unless otherwise documented below in the visit note. 

## 2013-10-22 NOTE — Progress Notes (Signed)
OFFICE NOTE  10/24/2013  CC:  Chief Complaint  Patient presents with  . Follow-up    6 month     HPI: Patient is a 68 y.o. Caucasian male who is here for 6 mo f/u HTN, hyperlip, CAD. Since I last saw him he reports a second episode of gout in same region (left MTP) but required no meds except a couple of vicodin.  He feels well except some mild, intermittent back pain in low back area that does not radiate and he has no paresthesias--for last 1-2 wks.  Rarely takes ibup or tyl for this.  No acute injury recalled.  B/B control normal.    Compliant with chronic meds. Rare home bp monitoring normal.    ROS: no chest pain, no SOB, no palpitations, no dizziness, no focal or generalized weakness.   Pertinent PMH:  Past medical, surgical, social, and family history reviewed and no changes are noted since last office visit.  MEDS:  Not on prednisone or vicodin currently Outpatient Prescriptions Prior to Visit  Medication Sig Dispense Refill  . aspirin 81 MG tablet Take 81 mg by mouth daily.        Marland Kitchen atorvastatin (LIPITOR) 20 MG tablet Take 1 tablet (20 mg total) by mouth daily.  30 tablet  3  . FLUoxetine (PROZAC) 10 MG capsule Take 10 mg by mouth daily.        . hydrochlorothiazide (HYDRODIURIL) 25 MG tablet TAKE ONE TABLET BY MOUTH ONCE DAILY  30 tablet  3  . irbesartan (AVAPRO) 150 MG tablet Take 1 tablet (150 mg total) by mouth at bedtime.  30 tablet  6  . nebivolol (BYSTOLIC) 10 MG tablet TAKE ONE TABLET BY MOUTH EVERY DAY  30 tablet  3  . HYDROcodone-acetaminophen (NORCO/VICODIN) 5-325 MG per tablet 1-2 tabs po q6h prn pain  30 tablet  0  . predniSONE (DELTASONE) 20 MG tablet 2 tabs po qd x 5d  10 tablet  0   No facility-administered medications prior to visit.  Not taking prednisone currently.  PE: Blood pressure 143/72, pulse 53, temperature 98.7 F (37.1 C), temperature source Temporal, resp. rate 18, height 5\' 5"  (1.651 m), weight 178 lb (80.74 kg), SpO2 97.00%. Gen:  Alert, well appearing.  Patient is oriented to person, place, time, and situation. CV: RRR Chest is clear, no wheezing or rales. Normal symmetric air entry throughout both lung fields. No chest wall deformities or tenderness. EXT: no clubbing, cyanosis, or edema.  BACK: ROM fully intact w/out stiffness or pain.  No tenderness to palpation. SLR neg bilat.  LE strength 5/5 prox and dist bilat.  DTR 1+ bilat at knees, trace bilat in ankles  IMPRESSION AND PLAN:  Low back strain Stretch, rest, correct lifting technique reviewed, prn NSAID/tylenol.  HTN (hypertension), benign The current medical regimen is effective;  continue present plan and medications. BMET today.  Gout Has had one significant flare, then a milder one has followed. Will check uric acid level. Colchicine rx today: 0.6mg , 2 tabs po at onset of gout pain, 1 tab one hour later. Will consider starting allopurinol soon.   Dyslipidemia Tolerating statin.  Lab Results  Component Value Date   CHOL 139 10/23/2012   HDL 54.00 10/23/2012   LDLCALC 65 10/23/2012   TRIG 98.0 10/23/2012   CHOLHDL 3 10/23/2012   Pt not fasting today. Will check this next f/u when fasting.  Preventative health care Pt deferred prevnar 13 until an upcoming f/u visit.   An  After Visit Summary was printed and given to the patient.  FOLLOW UP: a couple of months for annual medicare wellness visit.

## 2013-10-22 NOTE — Telephone Encounter (Signed)
Relevant patient education assigned to patient using Emmi. ° °

## 2013-10-24 DIAGNOSIS — S39012A Strain of muscle, fascia and tendon of lower back, initial encounter: Secondary | ICD-10-CM | POA: Insufficient documentation

## 2013-10-24 DIAGNOSIS — Z0000 Encounter for general adult medical examination without abnormal findings: Secondary | ICD-10-CM | POA: Insufficient documentation

## 2013-10-24 DIAGNOSIS — M109 Gout, unspecified: Secondary | ICD-10-CM | POA: Insufficient documentation

## 2013-10-24 HISTORY — DX: Strain of muscle, fascia and tendon of lower back, initial encounter: S39.012A

## 2013-10-24 NOTE — Assessment & Plan Note (Signed)
Tolerating statin.  Lab Results  Component Value Date   CHOL 139 10/23/2012   HDL 54.00 10/23/2012   LDLCALC 65 10/23/2012   TRIG 98.0 10/23/2012   CHOLHDL 3 10/23/2012   Pt not fasting today. Will check this next f/u when fasting.

## 2013-10-24 NOTE — Assessment & Plan Note (Signed)
Has had one significant flare, then a milder one has followed. Will check uric acid level. Colchicine rx today: 0.6mg , 2 tabs po at onset of gout pain, 1 tab one hour later. Will consider starting allopurinol soon.

## 2013-10-24 NOTE — Assessment & Plan Note (Signed)
The current medical regimen is effective;  continue present plan and medications. BMET today. 

## 2013-10-24 NOTE — Assessment & Plan Note (Signed)
Pt deferred prevnar 13 until an upcoming f/u visit.

## 2013-10-24 NOTE — Assessment & Plan Note (Signed)
Stretch, rest, correct lifting technique reviewed, prn NSAID/tylenol.

## 2013-10-25 ENCOUNTER — Other Ambulatory Visit: Payer: Self-pay | Admitting: Family Medicine

## 2013-10-25 MED ORDER — ATORVASTATIN CALCIUM 20 MG PO TABS: 20.0000 mg | ORAL_TABLET | Freq: Every day | ORAL | Status: DC

## 2013-10-25 MED ORDER — NEBIVOLOL HCL 10 MG PO TABS: ORAL_TABLET | ORAL | Status: DC

## 2013-10-25 MED ORDER — HYDROCHLOROTHIAZIDE 25 MG PO TABS: ORAL_TABLET | ORAL | Status: DC

## 2013-10-25 MED ORDER — IRBESARTAN 150 MG PO TABS: 150.0000 mg | ORAL_TABLET | Freq: Every day | ORAL | Status: DC

## 2013-10-25 NOTE — Telephone Encounter (Signed)
Rxs sent to [facility]

## 2013-12-10 ENCOUNTER — Encounter: Payer: Self-pay | Admitting: Family Medicine

## 2013-12-10 ENCOUNTER — Ambulatory Visit (INDEPENDENT_AMBULATORY_CARE_PROVIDER_SITE_OTHER): Payer: Medicare Other | Admitting: Family Medicine

## 2013-12-10 VITALS — BP 132/81 | HR 57 | Temp 98.3°F | Resp 18 | Ht 66.0 in | Wt 177.0 lb

## 2013-12-10 DIAGNOSIS — Z0000 Encounter for general adult medical examination without abnormal findings: Secondary | ICD-10-CM

## 2013-12-10 NOTE — Progress Notes (Signed)
The patient is here for annual Medicare wellness examination and management of other chronic and acute problems.   The risk factors are reflected in the social history.  The roster of all physicians providing medical care to patient - is listed in the Snapshot section of the chart.  Activities of daily living:  The patient is 100% inedpendent in all ADLs: dressing, toileting, feeding as well as independent mobility  Home safety : The patient has smoke detectors in the home. They wear seatbelts.No firearms at home ( firearms are present in the home, kept in a safe fashion). There is no violence in the home.   There is no risks for hepatitis, STDs or HIV. There is no history of blood transfusion. They have no travel history to infectious disease endemic areas of the world.  The patient has seen their dentist in the last six month. They have seen their eye doctor in the last year. They deny any hearing difficulty and have not had audiologic testing in the last year.  They do not  have excessive sun exposure. Discussed the need for sun protection: hats, long sleeves and use of sunscreen if there is significant sun exposure.   Diet: the importance of a healthy diet is discussed. They do have a heart healthy diet.  The benefits of regular aerobic exercise were discussed.  Depression screen: there are no signs or vegative symptoms of depression- irritability, change in appetite, anhedonia, sadness/tearfullness.  Cognitive assessment: the patient manages all their financial and personal affairs and is actively engaged. They could relate day,date,year and events; recalled 3/3 objects at 3 minutes; performed clock-face test normally.  The following portions of the patient's history were reviewed and updated as appropriate: allergies, current medications, past family history, past medical history,  past surgical history, past social history  and problem list.  Vision, hearing, body mass index were  assessed and reviewed.   He is slowly purposefully losing wt.  During the course of the visit the patient was educated and counseled about appropriate screening and preventive services including : fall prevention , diabetes screening, nutrition counseling, colorectal cancer screening, and recommended immunizations. Prevnar 13 given today.  Patient does have living will, plans to do POA designation soon.

## 2013-12-10 NOTE — Progress Notes (Signed)
Pre visit review using our clinic review tool, if applicable. No additional management support is needed unless otherwise documented below in the visit note. 

## 2014-02-15 ENCOUNTER — Encounter: Payer: Self-pay | Admitting: Nurse Practitioner

## 2014-02-15 ENCOUNTER — Ambulatory Visit (INDEPENDENT_AMBULATORY_CARE_PROVIDER_SITE_OTHER): Payer: Medicare Other | Admitting: Nurse Practitioner

## 2014-02-15 ENCOUNTER — Other Ambulatory Visit: Payer: Self-pay | Admitting: *Deleted

## 2014-02-15 VITALS — BP 103/55 | HR 61 | Temp 98.9°F | Ht 66.0 in | Wt 176.0 lb

## 2014-02-15 DIAGNOSIS — H65193 Other acute nonsuppurative otitis media, bilateral: Secondary | ICD-10-CM

## 2014-02-15 DIAGNOSIS — H65199 Other acute nonsuppurative otitis media, unspecified ear: Secondary | ICD-10-CM

## 2014-02-15 MED ORDER — AZITHROMYCIN 250 MG PO TABS: ORAL_TABLET | ORAL | Status: DC

## 2014-02-15 NOTE — Telephone Encounter (Signed)
Rx canceled to [facility] and resent to [facility]

## 2014-02-15 NOTE — Patient Instructions (Signed)
Start antibiotic today. Follow up with Dr Anitra Lauth next week to re-check ears.  Otitis Media Otitis media is redness, soreness, and inflammation of the middle ear. Otitis media may be caused by allergies or, most commonly, by infection. Often it occurs as a complication of the common cold. SIGNS AND SYMPTOMS Symptoms of otitis media may include:  Earache.  Fever.  Ringing in your ear.  Headache.  Leakage of fluid from the ear. DIAGNOSIS To diagnose otitis media, your health care provider will examine your ear with an otoscope. This is an instrument that allows your health care provider to see into your ear in order to examine your eardrum. Your health care provider also will ask you questions about your symptoms. TREATMENT  Typically, otitis media resolves on its own within 3-5 days. Your health care provider may prescribe medicine to ease your symptoms of pain. If otitis media does not resolve within 5 days or is recurrent, your health care provider may prescribe antibiotic medicines if he or she suspects that a bacterial infection is the cause. HOME CARE INSTRUCTIONS   If you were prescribed an antibiotic medicine, finish it all even if you start to feel better.  Take medicines only as directed by your health care provider.  Keep all follow-up visits as directed by your health care provider. SEEK MEDICAL CARE IF:  You have otitis media only in one ear, or bleeding from your nose, or both.  You notice a lump on your neck.  You are not getting better in 3-5 days.  You feel worse instead of better. SEEK IMMEDIATE MEDICAL CARE IF:   You have pain that is not controlled with medicine.  You have swelling, redness, or pain around your ear or stiffness in your neck.  You notice that part of your face is paralyzed.  You notice that the bone behind your ear (mastoid) is tender when you touch it. MAKE SURE YOU:   Understand these instructions.  Will watch your  condition.  Will get help right away if you are not doing well or get worse. Document Released: 04/05/2004 Document Revised: 11/15/2013 Document Reviewed: 01/26/2013 [facility] Patient Information 2015 [place], [place]. This information is not intended to replace advice given to you by your health care provider. Make sure you discuss any questions you have with your health care provider.

## 2014-02-15 NOTE — Progress Notes (Signed)
   Subjective:    Patient ID: Anthony Daugherty, male    DOB: 1946/02/17, 68 y.o.   MRN: [id number]  Cough This is a new problem. The current episode started 1 to 4 weeks ago (2 wks). The problem has been gradually improving (coughs hard at times, ribs sore, not keeping awake). The problem occurs every few hours. The cough is productive of sputum (clear). Associated symptoms include ear congestion (4 days ago, woke with pressure "in head, pushing outward to ears"  that evening felt fluid run out of L ear & down neck, described as clear. Pressure was relieved after fdelt ear drain.), headaches, nasal congestion and a sore throat (resolved). Pertinent negatives include no chest pain, chills, ear pain, eye redness, fever, rash, shortness of breath or wheezing.      Review of Systems  Constitutional: Negative for fever, chills and fatigue.  HENT: Positive for congestion and sore throat (resolved). Negative for ear pain.   Eyes: Negative for pain and redness.  Respiratory: Positive for cough. Negative for chest tightness, shortness of breath and wheezing.   Cardiovascular: Negative for chest pain.  Skin: Negative for rash.  Neurological: Positive for headaches.       Objective:   Physical Exam  Vitals reviewed. Constitutional: He is oriented to person, place, and time. He appears well-developed. No distress.  HENT:  Head: Normocephalic and atraumatic.  Right Ear: No drainage, swelling or tenderness. No mastoid tenderness. Tympanic membrane is injected. Tympanic membrane is not perforated, not retracted and not bulging. No middle ear effusion.  Left Ear: There is drainage. No swelling or tenderness. No mastoid tenderness. Tympanic membrane is injected. Tympanic membrane is not perforated, not retracted and not bulging.  No middle ear effusion.  Ears:  Mouth/Throat: Oropharynx is clear and moist. No oropharyngeal exudate.  Eyes: Conjunctivae are normal. Right eye exhibits no discharge. Left  eye exhibits no discharge.  Neck: Normal range of motion. Neck supple.  Cardiovascular: Normal rate, regular rhythm and normal heart sounds.   No murmur heard. Pulmonary/Chest: Effort normal and breath sounds normal. No respiratory distress. He has no wheezes. He has no rales.  Lymphadenopathy:    He has no cervical adenopathy.  Neurological: He is alert and oriented to person, place, and time.  Skin: Skin is warm and dry.  Psychiatric: He has a normal mood and affect. His behavior is normal. Thought content normal.          Assessment & Plan:  1. Acute nonsuppurative otitis media of both ears L TM appears to have been perforated, traces of sangionous drainage in canal. - azithromycin (ZITHROMAX) 250 MG tablet; Take 2 T po today, then 1T PO daily for 4 days.  Dispense: 6 tablet; Refill: 0 F/u 1 week.

## 2014-02-15 NOTE — Progress Notes (Signed)
Pre visit review using our clinic review tool, if applicable. No additional management support is needed unless otherwise documented below in the visit note. 

## 2014-02-25 ENCOUNTER — Ambulatory Visit (INDEPENDENT_AMBULATORY_CARE_PROVIDER_SITE_OTHER): Payer: Medicare Other | Admitting: Family Medicine

## 2014-02-25 ENCOUNTER — Encounter: Payer: Self-pay | Admitting: Family Medicine

## 2014-02-25 VITALS — BP 134/69 | HR 44 | Temp 98.6°F | Resp 18 | Ht 66.0 in | Wt 179.0 lb

## 2014-02-25 DIAGNOSIS — H7292 Unspecified perforation of tympanic membrane, left ear: Principal | ICD-10-CM

## 2014-02-25 DIAGNOSIS — H6690 Otitis media, unspecified, unspecified ear: Secondary | ICD-10-CM | POA: Diagnosis not present

## 2014-02-25 DIAGNOSIS — H6692 Otitis media, unspecified, left ear: Secondary | ICD-10-CM

## 2014-02-25 HISTORY — DX: Otitis media, unspecified, left ear: H66.92

## 2014-02-25 NOTE — Progress Notes (Signed)
OFFICE NOTE  02/25/2014  CC:  Chief Complaint  Patient presents with  . Follow-up    ear problem. Saw Layne 02/15/14     HPI: Patient is a 68 y.o. Caucasian male who is here for f/u ear problem. Saw Nicky Pugh, FNP, here on 02/15/14 and dx'd with bilat AOM with evidence of recent L TM perf, azith given. Feeling better, still feels like he has some mild pressure in both ears.  No ear pain or drainage.   Pertinent PMH:  Past medical, surgical, social, and family history reviewed and no changes are noted since last office visit.  MEDS:  Outpatient Prescriptions Prior to Visit  Medication Sig Dispense Refill  . aspirin 81 MG tablet Take 81 mg by mouth daily.        Marland Kitchen atorvastatin (LIPITOR) 20 MG tablet Take 1 tablet (20 mg total) by mouth daily.  90 tablet  1  . colchicine 0.6 MG tablet 2 tabs po at onset of gout pain, then 1 tab po 1 hour later  30 tablet  1  . FLUoxetine (PROZAC) 10 MG capsule Take 10 mg by mouth daily.        . hydrochlorothiazide (HYDRODIURIL) 25 MG tablet TAKE ONE TABLET BY MOUTH ONCE DAILY  90 tablet  1  . irbesartan (AVAPRO) 150 MG tablet Take 1 tablet (150 mg total) by mouth at bedtime.  90 tablet  1  . nebivolol (BYSTOLIC) 10 MG tablet TAKE ONE TABLET BY MOUTH EVERY DAY  90 tablet  1  . azithromycin (ZITHROMAX) 250 MG tablet Take 2 T po today, then 1T PO daily for 4 days.  6 tablet  0   No facility-administered medications prior to visit.    PE: Blood pressure 134/69, pulse 44, temperature 98.6 F (37 C), temperature source Temporal, resp. rate 18, height 5\' 6"  (1.676 m), weight 179 lb (81.194 kg), SpO2 97.00%. Gen: Alert, well appearing.  Patient is oriented to person, place, time, and situation. Right EAC and TM normal. Left EAC clear, left TM with a small ellipse of fibrous appearing tissue at inferoposterior portion of TM.  Mild bruising at interersection of TM and EAC epithelium diffusely.  No middle ear fluid or loss of TM landmarks.  IMPRESSION AND  PLAN:  REsolved bilat AOM, with perf healed on left.   This appears to be still resolving but no new meds/treatments at this time. Watchful waiting approach. He has f/u with me early next month for routine chronic illness f/u and I'll recheck ear at that time.  An After Visit Summary was printed and given to the patient.

## 2014-02-25 NOTE — Progress Notes (Signed)
Pre visit review using our clinic review tool, if applicable. No additional management support is needed unless otherwise documented below in the visit note. 

## 2014-03-18 ENCOUNTER — Encounter: Payer: Self-pay | Admitting: Family Medicine

## 2014-03-18 ENCOUNTER — Ambulatory Visit (INDEPENDENT_AMBULATORY_CARE_PROVIDER_SITE_OTHER): Payer: Medicare Other | Admitting: Family Medicine

## 2014-03-18 VITALS — BP 151/75 | HR 55 | Temp 98.5°F | Resp 18 | Ht 66.0 in | Wt 176.0 lb

## 2014-03-18 DIAGNOSIS — I10 Essential (primary) hypertension: Secondary | ICD-10-CM | POA: Diagnosis not present

## 2014-03-18 DIAGNOSIS — M109 Gout, unspecified: Secondary | ICD-10-CM | POA: Diagnosis not present

## 2014-03-18 DIAGNOSIS — E785 Hyperlipidemia, unspecified: Secondary | ICD-10-CM

## 2014-03-18 DIAGNOSIS — F411 Generalized anxiety disorder: Secondary | ICD-10-CM

## 2014-03-18 DIAGNOSIS — Z1159 Encounter for screening for other viral diseases: Secondary | ICD-10-CM

## 2014-03-18 DIAGNOSIS — M10072 Idiopathic gout, left ankle and foot: Secondary | ICD-10-CM

## 2014-03-18 LAB — AST: AST: 23 U/L (ref 0–37)

## 2014-03-18 LAB — LIPID PANEL
Cholesterol: 152 mg/dL (ref 0–200)
HDL: 69 mg/dL (ref >39.00)
LDL Cholesterol: 75 mg/dL (ref 0–99)
NonHDL: 83
Total CHOL/HDL Ratio: 2
Triglycerides: 40 mg/dL (ref 0.0–149.0)
VLDL: 8 mg/dL (ref 0.0–40.0)

## 2014-03-18 LAB — ALT: ALT: 20 U/L (ref 0–53)

## 2014-03-18 NOTE — Progress Notes (Signed)
OFFICE NOTE  03/18/2014  CC:  Chief Complaint  Patient presents with  . Follow-up   HPI: Patient is a 68 y.o. Caucasian male who is here for routine f/u hx of cardiomyopathy, HTN, Hyperlipidemia, anxiety, gout. Started prn colchicine back in 10/2013, uric acid level was wnl, no preventative gout med started. No gout flares since I rx'd the colchicine. Doing well on lipitor, HCTZ, bystolic, avapro. Works out in his garden some.  ROS: no orthopnea, no PND, no CP, no palpitations.  No SOB but it doesn't sound like he exerts himself very much usually.   Pertinent PMH:  Past medical, surgical, social, and family history reviewed and no changes are noted since last office visit.  MEDS:  Outpatient Prescriptions Prior to Visit  Medication Sig Dispense Refill  . aspirin 81 MG tablet Take 81 mg by mouth daily.        Marland Kitchen atorvastatin (LIPITOR) 20 MG tablet Take 1 tablet (20 mg total) by mouth daily.  90 tablet  1  . colchicine 0.6 MG tablet 2 tabs po at onset of gout pain, then 1 tab po 1 hour later  30 tablet  1  . FLUoxetine (PROZAC) 10 MG capsule Take 10 mg by mouth daily.        . hydrochlorothiazide (HYDRODIURIL) 25 MG tablet TAKE ONE TABLET BY MOUTH ONCE DAILY  90 tablet  1  . irbesartan (AVAPRO) 150 MG tablet Take 1 tablet (150 mg total) by mouth at bedtime.  90 tablet  1  . nebivolol (BYSTOLIC) 10 MG tablet TAKE ONE TABLET BY MOUTH EVERY DAY  90 tablet  1   No facility-administered medications prior to visit.    PE: Blood pressure 151/75, pulse 55, temperature 98.5 F (36.9 C), temperature source Temporal, resp. rate 18, height 5\' 6"  (1.676 m), weight 176 lb (79.833 kg), SpO2 96.00%. Gen: Alert, well appearing.  Patient is oriented to person, place, time, and situation. CV: RRR, trace/soft systolic murmur--distant S1 and S2 as per his normal. LUNGS: CTA bilat, nonlabored resps EXT: no clubbing, cyanosis, or edema.    IMPRESSION AND PLAN:  1) Hyperlipidemia: due for FLP,  AST/ALT.  2) HTN: The current medical regimen is effective;  continue present plan and medications.  3) Gout: no further flares since I last saw him.  No preventative meds at this time.  He has colchicine on hand to use prn.  4) Anxiety: stable.  The current medical regimen is effective;  continue present plan and medications.  An After Visit Summary was printed and given to the patient.  FOLLOW UP: 3 mo--will do PSA at that time and he will consider prevnar.  Come back for flu vaccine in 4-6 wks.

## 2014-03-18 NOTE — Progress Notes (Signed)
Pre visit review using our clinic review tool, if applicable. No additional management support is needed unless otherwise documented below in the visit note. 

## 2014-03-19 LAB — HEPATITIS C ANTIBODY: HCV Ab: NEGATIVE

## 2014-05-09 ENCOUNTER — Other Ambulatory Visit: Payer: Self-pay | Admitting: Family Medicine

## 2014-05-30 ENCOUNTER — Other Ambulatory Visit: Payer: Self-pay | Admitting: Family Medicine

## 2014-07-22 ENCOUNTER — Ambulatory Visit (INDEPENDENT_AMBULATORY_CARE_PROVIDER_SITE_OTHER): Payer: Medicare Other | Admitting: Family Medicine

## 2014-07-22 ENCOUNTER — Encounter: Payer: Self-pay | Admitting: Family Medicine

## 2014-07-22 VITALS — BP 156/75 | HR 59 | Temp 98.6°F | Resp 18 | Ht 66.0 in | Wt 182.0 lb

## 2014-07-22 DIAGNOSIS — Z23 Encounter for immunization: Secondary | ICD-10-CM

## 2014-07-22 DIAGNOSIS — Z125 Encounter for screening for malignant neoplasm of prostate: Secondary | ICD-10-CM | POA: Diagnosis not present

## 2014-07-22 DIAGNOSIS — F411 Generalized anxiety disorder: Secondary | ICD-10-CM | POA: Diagnosis not present

## 2014-07-22 DIAGNOSIS — I10 Essential (primary) hypertension: Secondary | ICD-10-CM | POA: Diagnosis not present

## 2014-07-22 DIAGNOSIS — E785 Hyperlipidemia, unspecified: Secondary | ICD-10-CM | POA: Diagnosis not present

## 2014-07-22 DIAGNOSIS — I428 Other cardiomyopathies: Secondary | ICD-10-CM

## 2014-07-22 DIAGNOSIS — I429 Cardiomyopathy, unspecified: Secondary | ICD-10-CM | POA: Diagnosis not present

## 2014-07-22 DIAGNOSIS — Z0000 Encounter for general adult medical examination without abnormal findings: Secondary | ICD-10-CM

## 2014-07-22 LAB — PSA, MEDICARE: PSA: 1.59 ng/ml (ref 0.10–4.00)

## 2014-07-22 NOTE — Progress Notes (Signed)
OFFICE NOTE  07/22/2014  CC:  Chief Complaint  Patient presents with  . Follow-up    fasting     HPI: Patient is a 69 y.o. Caucasian male who is here for 4 mo f/u hx of cardiomyopathy, HTN, hyperlipidemia, anxiety. Due for PSA today, prevnar 13 as well. Feeling well. Home bp's normal.  Compliant with all meds.   Anxiety stable.   No CP, SOB, DOE, palpitations, or dizziness.  No gout flares since last visit. Has nocturia 2-5 times a night depending on how much water he drinks in the evenings.  No lower urinary obstructive symptoms in daytime.  Pertinent PMH:  Past medical, surgical, social, and family history reviewed and no changes are noted since last office visit.  MEDS:  Outpatient Prescriptions Prior to Visit  Medication Sig Dispense Refill  . aspirin 81 MG tablet Take 81 mg by mouth daily.      Marland Kitchen atorvastatin (LIPITOR) 20 MG tablet Take 1 tablet by mouth  daily 90 tablet 1  . BYSTOLIC 10 MG tablet Take 1 tablet by mouth  every day 90 tablet 1  . hydrochlorothiazide (HYDRODIURIL) 25 MG tablet Take 1 tablet by mouth once a day 90 tablet 1  . irbesartan (AVAPRO) 150 MG tablet Take 1 tablet by mouth at  bedtime. 90 tablet 1  . FLUoxetine (PROZAC) 10 MG capsule Take 10 mg by mouth daily.      . colchicine 0.6 MG tablet 2 tabs po at onset of gout pain, then 1 tab po 1 hour later (Patient not taking: Reported on 07/22/2014) 30 tablet 1   No facility-administered medications prior to visit.    PE: Blood pressure 156/75, pulse 59, temperature 98.6 F (37 C), temperature source Temporal, resp. rate 18, height 5\' 6"  (1.676 m), weight 182 lb (82.555 kg), SpO2 99 %. Gen: Alert, well appearing.  Patient is oriented to person, place, time, and situation. Rectal exam: negative without mass, lesions or tenderness, PROSTATE EXAM: smooth and symmetric without nodules or tenderness.   LAB: none today RECENT: Lab Results  Component Value Date   CHOL 152 03/18/2014   HDL 69.00  03/18/2014   LDLCALC 75 03/18/2014   TRIG 40.0 03/18/2014   CHOLHDL 2 03/18/2014     Chemistry      Component Value Date/Time   NA 138 10/22/2013 0959   K 4.1 10/22/2013 0959   CL 103 10/22/2013 0959   CO2 31 10/22/2013 0959   BUN 17 10/22/2013 0959   CREATININE 0.9 10/22/2013 0959      Component Value Date/Time   CALCIUM 9.5 10/22/2013 0959   ALKPHOS 58 10/23/2012 0827   AST 23 03/18/2014 0838   ALT 20 03/18/2014 0838   BILITOT 0.8 10/23/2012 0827     Lab Results  Component Value Date   PSA 1.28 04/23/2013   PSA 1.24 08/16/2011   PSA 0.95 07/19/2010    IMPRESSION AND PLAN:  1) Cardiomyopathy: The current medical regimen is effective;  continue present plan and medications.  2) HTN; The current medical regimen is effective;  continue present plan and medications. Home measurements normal.  3) Hyperlipidemia: The current medical regimen is effective;  continue present plan and medications.  4) Prostate cancer screening: DRE normal today.  PSA drawn today.  5) Prev health: prevnar 13 IM today.  6) Anxiety: The current medical regimen is effective;  continue present plan and medications.  An After Visit Summary was printed and given to the patient.  FOLLOW UP:  4 mo

## 2014-07-22 NOTE — Addendum Note (Signed)
Addended by: Ralph Dowdy on: 07/22/2014 10:05 AM   Modules accepted: Orders

## 2014-07-22 NOTE — Progress Notes (Signed)
Pre visit review using our clinic review tool, if applicable. No additional management support is needed unless otherwise documented below in the visit note. 

## 2014-11-18 ENCOUNTER — Encounter: Payer: Self-pay | Admitting: Family Medicine

## 2014-11-18 ENCOUNTER — Ambulatory Visit (INDEPENDENT_AMBULATORY_CARE_PROVIDER_SITE_OTHER): Payer: Medicare Other | Admitting: Family Medicine

## 2014-11-18 VITALS — BP 126/60 | HR 55 | Temp 99.3°F | Resp 16 | Wt 176.0 lb

## 2014-11-18 DIAGNOSIS — E785 Hyperlipidemia, unspecified: Secondary | ICD-10-CM

## 2014-11-18 DIAGNOSIS — R6889 Other general symptoms and signs: Secondary | ICD-10-CM | POA: Diagnosis not present

## 2014-11-18 DIAGNOSIS — R5382 Chronic fatigue, unspecified: Secondary | ICD-10-CM | POA: Diagnosis not present

## 2014-11-18 DIAGNOSIS — I10 Essential (primary) hypertension: Secondary | ICD-10-CM | POA: Diagnosis not present

## 2014-11-18 DIAGNOSIS — I2510 Atherosclerotic heart disease of native coronary artery without angina pectoris: Secondary | ICD-10-CM | POA: Diagnosis not present

## 2014-11-18 DIAGNOSIS — L603 Nail dystrophy: Secondary | ICD-10-CM

## 2014-11-18 DIAGNOSIS — I429 Cardiomyopathy, unspecified: Secondary | ICD-10-CM

## 2014-11-18 DIAGNOSIS — I428 Other cardiomyopathies: Secondary | ICD-10-CM

## 2014-11-18 LAB — COMPREHENSIVE METABOLIC PANEL
ALT: 19 U/L (ref 0–53)
AST: 19 U/L (ref 0–37)
Albumin: 4.3 g/dL (ref 3.5–5.2)
Alkaline Phosphatase: 64 U/L (ref 39–117)
BUN: 15 mg/dL (ref 6–23)
CALCIUM: 9.4 mg/dL (ref 8.4–10.5)
CHLORIDE: 101 meq/L (ref 96–112)
CO2: 32 mEq/L (ref 19–32)
Creatinine, Ser: 0.82 mg/dL (ref 0.40–1.50)
GFR: 99.24 mL/min (ref >60.00)
Glucose, Bld: 101 mg/dL — ABNORMAL HIGH (ref 70–99)
Potassium: 4.2 mEq/L (ref 3.5–5.1)
SODIUM: 138 meq/L (ref 135–145)
TOTAL PROTEIN: 6.7 g/dL (ref 6.0–8.3)
Total Bilirubin: 1 mg/dL (ref 0.2–1.2)

## 2014-11-18 LAB — TSH: TSH: 1.32 u[IU]/mL (ref 0.35–4.50)

## 2014-11-18 NOTE — Patient Instructions (Signed)
Call Dr. Alan Ripper office (cardiology) and make a routine follow up appointment with her: [phone].

## 2014-11-18 NOTE — Progress Notes (Signed)
Pre visit review using our clinic review tool, if applicable. No additional management support is needed unless otherwise documented below in the visit note. 

## 2014-11-18 NOTE — Progress Notes (Signed)
OFFICE NOTE  11/18/2014  CC:  Chief Complaint  Patient presents with  . Follow-up   HPI: Patient is a 69 y.o. Caucasian male who is here for 4 mo f/u HTN, hyperlipidemia, anxiety, hx of nonischemic CM. Feels "cold" in the mornings when he gets up-"for a long time".  This resolves as he gets moving and gets his day started.  Fingernails brittle, sometimes the tip of one will break off when he hits it on something. Energy level decreased last 4 yrs.  He doesn't exercise any but has a bike he wants to start using.  Erections getting less firm and occurring less frequent.  Intercourse is possible but "not like it used to be", plus he only makes it to climax about 30-40% of the time. Has nocturnal erections.  Has normal sexual desire.    ROS: no CP, no SOB, no dizziness, no orthopnea or PND. Anxiety doing MUCH better last few months, no panic attacks anymore.  Wants to continue on current med regimen for this for the time being.  Pertinent PMH:  Past medical, surgical, social, and family history reviewed and no changes are noted since last office visit.  MEDS:  Outpatient Prescriptions Prior to Visit  Medication Sig Dispense Refill  . aspirin 81 MG tablet Take 81 mg by mouth daily.      Marland Kitchen atorvastatin (LIPITOR) 20 MG tablet Take 1 tablet by mouth  daily 90 tablet 1  . BYSTOLIC 10 MG tablet Take 1 tablet by mouth  every day 90 tablet 1  . colchicine 0.6 MG tablet 2 tabs po at onset of gout pain, then 1 tab po 1 hour later 30 tablet 1  . FLUoxetine (PROZAC) 20 MG capsule 20 mg daily.    . hydrochlorothiazide (HYDRODIURIL) 25 MG tablet Take 1 tablet by mouth once a day 90 tablet 1  . irbesartan (AVAPRO) 150 MG tablet Take 1 tablet by mouth at  bedtime. 90 tablet 1   No facility-administered medications prior to visit.    PE: Blood pressure 126/60, pulse 55, temperature 99.3 F (37.4 C), temperature source Temporal, resp. rate 16, weight 176 lb (79.833 kg), SpO2 98 %. Gen: Alert, well  appearing.  Patient is oriented to person, place, time, and situation. CV: RRR, 2/6 syst murmur, no rub or gallop. Chest is clear, no wheezing or rales. Normal symmetric air entry throughout both lung fields. No chest wall deformities or tenderness. EXT: no clubbing, cyanosis, or edema.  NEURO: no tremors, no ataxia, normal strength in all extremities. Fingernails: no flaking or breaks or discoloration.  Some mild normal-appearing ridging on all fingernails.  LABS:  Lab Results  Component Value Date   CHOL 152 03/18/2014   HDL 69.00 03/18/2014   LDLCALC 75 03/18/2014   TRIG 40.0 03/18/2014   CHOLHDL 2 03/18/2014     Chemistry      Component Value Date/Time   NA 138 10/22/2013 0959   K 4.1 10/22/2013 0959   CL 103 10/22/2013 0959   CO2 31 10/22/2013 0959   BUN 17 10/22/2013 0959   CREATININE 0.9 10/22/2013 0959      Component Value Date/Time   CALCIUM 9.5 10/22/2013 0959   ALKPHOS 58 10/23/2012 0827   AST 23 03/18/2014 0838   ALT 20 03/18/2014 0838   BILITOT 0.8 10/23/2012 0827      IMPRESSION AND PLAN:  1) Fatigue, cold intolerance.   Check TSH.  2) HTN: The current medical regimen is effective;  continue present plan  and medications. Lytes/cr today.  3) Hyperlipidemia:  Tolerating atorv 20mg  qd well, LDL 75 and transaminases normal 03/2014.  4) Erectile dysfunction: pt says he is not yet ready to try an ED med at this time.  5) CAD: one vessel, medical mgmt-asymptomatic.  Continue ASA, beta blocker, statin.  Increase exercise.  6) Nonischemic CM: no sign of volume overload.  Has not had cardiology f/u or repeat ECHO in several years. Continue ARB, beta blocker, monitor weight, limit Na. Will discuss possibility of repeat ECHO vs getting routine cardiology f/u (or both) at next routine f/u with me.  An After Visit Summary was printed and given to the patient.  FOLLOW UP: 24mo

## 2014-11-23 ENCOUNTER — Other Ambulatory Visit: Payer: Self-pay | Admitting: Family Medicine

## 2014-11-27 ENCOUNTER — Encounter: Payer: Self-pay | Admitting: Family Medicine

## 2014-12-05 ENCOUNTER — Other Ambulatory Visit: Payer: Self-pay | Admitting: Family Medicine

## 2015-02-01 ENCOUNTER — Ambulatory Visit (INDEPENDENT_AMBULATORY_CARE_PROVIDER_SITE_OTHER): Payer: Medicare Other | Admitting: Family Medicine

## 2015-02-01 ENCOUNTER — Encounter: Payer: Self-pay | Admitting: Family Medicine

## 2015-02-01 VITALS — BP 110/69 | HR 56 | Temp 98.2°F | Resp 16 | Ht 66.0 in | Wt 172.0 lb

## 2015-02-01 DIAGNOSIS — M10071 Idiopathic gout, right ankle and foot: Secondary | ICD-10-CM

## 2015-02-01 DIAGNOSIS — I2510 Atherosclerotic heart disease of native coronary artery without angina pectoris: Secondary | ICD-10-CM | POA: Diagnosis not present

## 2015-02-01 MED ORDER — PREDNISONE 20 MG PO TABS: ORAL_TABLET | ORAL | Status: DC

## 2015-02-01 MED ORDER — COLCHICINE 0.6 MG PO TABS: ORAL_TABLET | ORAL | Status: DC

## 2015-02-01 NOTE — Progress Notes (Signed)
OFFICE NOTE  02/01/2015  CC:  Chief Complaint  Patient presents with  . Gout    Right great toe x 4 days   HPI: Patient is a 69 y.o. Caucasian male who is here for pain in right great toe, MCP region, red and swollen.  Took colchicine and felt improved over next day or so, then sx's recurred and he took colchicine again yesterday and feels some improvement but far from well.  No side effects from colchicine.  He does eat a lot of legumes/beans as well as asparagus and cauliflower.  He does recall hitting his toe on bottom board of a step recently, then the flare started within 3 hours.    ROS: no fevers, no malaise, no other joint complaints, no recent new meds.    Pertinent PMH:  Past medical, surgical, social, and family history reviewed and no changes are noted since last office visit. +Hx of gout, usually in R great toe.  MEDS:  Outpatient Prescriptions Prior to Visit  Medication Sig Dispense Refill  . aspirin 81 MG tablet Take 81 mg by mouth daily.      Marland Kitchen atorvastatin (LIPITOR) 20 MG tablet Take 1 tablet by mouth  daily 90 tablet 1  . BYSTOLIC 10 MG tablet Take 1 tablet by mouth  every day 90 tablet 1  . colchicine 0.6 MG tablet 2 tabs po at onset of gout pain, then 1 tab po 1 hour later 30 tablet 1  . FLUoxetine (PROZAC) 20 MG capsule 20 mg daily.    . hydrochlorothiazide (HYDRODIURIL) 25 MG tablet Take 1 tablet by mouth once a day 90 tablet 1  . irbesartan (AVAPRO) 150 MG tablet Take 1 tablet by mouth at  bedtime 90 tablet 1   No facility-administered medications prior to visit.    PE: Blood pressure 110/69, pulse 56, temperature 98.2 F (36.8 C), temperature source Oral, resp. rate 16, height 5\' 6"  (1.676 m), weight 172 lb (78.019 kg), SpO2 99 %. Gen: Alert, well appearing.  Patient is oriented to person, place, time, and situation. Right great toe with signif erythema, warmth, and swelling over MTP.  He can move all toes.  No bruising.   IMPRESSION AND PLAN:  Right  great toe podagra. Prednisone 40mg  qd x 5d. He has vicodin at home to use prn. He will stick with colchicine prn future flares (per his choice today) but agrees to consideration of prophylactic med if he has another severe flare w/in the next few months.  An After Visit Summary was printed and given to the patient.   FOLLOW UP: prn

## 2015-02-01 NOTE — Progress Notes (Signed)
Pre visit review using our clinic review tool, if applicable. No additional management support is needed unless otherwise documented below in the visit note. 

## 2015-02-10 ENCOUNTER — Other Ambulatory Visit: Payer: Self-pay | Admitting: Family Medicine

## 2015-02-10 NOTE — Telephone Encounter (Signed)
RF request for colchicine LOV: 02/01/15 Next ov: 05/19/15 Last written: 02/01/15 #30 w/ 1RF

## 2015-04-21 DIAGNOSIS — Z23 Encounter for immunization: Secondary | ICD-10-CM | POA: Diagnosis not present

## 2015-04-24 ENCOUNTER — Other Ambulatory Visit: Payer: Self-pay | Admitting: Family Medicine

## 2015-05-19 ENCOUNTER — Encounter: Payer: Self-pay | Admitting: Family Medicine

## 2015-05-19 ENCOUNTER — Ambulatory Visit (INDEPENDENT_AMBULATORY_CARE_PROVIDER_SITE_OTHER): Payer: Medicare Other | Admitting: Family Medicine

## 2015-05-19 VITALS — BP 122/70 | HR 59 | Temp 98.1°F | Resp 16 | Ht 66.0 in | Wt 173.0 lb

## 2015-05-19 DIAGNOSIS — I428 Other cardiomyopathies: Secondary | ICD-10-CM

## 2015-05-19 DIAGNOSIS — F411 Generalized anxiety disorder: Secondary | ICD-10-CM | POA: Diagnosis not present

## 2015-05-19 DIAGNOSIS — I2510 Atherosclerotic heart disease of native coronary artery without angina pectoris: Secondary | ICD-10-CM | POA: Diagnosis not present

## 2015-05-19 DIAGNOSIS — I10 Essential (primary) hypertension: Secondary | ICD-10-CM | POA: Diagnosis not present

## 2015-05-19 DIAGNOSIS — E785 Hyperlipidemia, unspecified: Secondary | ICD-10-CM | POA: Diagnosis not present

## 2015-05-19 DIAGNOSIS — I429 Cardiomyopathy, unspecified: Secondary | ICD-10-CM

## 2015-05-19 LAB — LIPID PANEL
CHOLESTEROL: 161 mg/dL (ref 0–200)
HDL: 70.3 mg/dL (ref >39.00)
LDL CALC: 82 mg/dL (ref 0–99)
NonHDL: 90.89
TRIGLYCERIDES: 46 mg/dL (ref 0.0–149.0)
Total CHOL/HDL Ratio: 2
VLDL: 9.2 mg/dL (ref 0.0–40.0)

## 2015-05-19 LAB — BASIC METABOLIC PANEL
BUN: 12 mg/dL (ref 6–23)
CHLORIDE: 103 meq/L (ref 96–112)
CO2: 28 mEq/L (ref 19–32)
CREATININE: 0.86 mg/dL (ref 0.40–1.50)
Calcium: 9.2 mg/dL (ref 8.4–10.5)
GFR: 93.79 mL/min (ref >60.00)
Glucose, Bld: 88 mg/dL (ref 70–99)
POTASSIUM: 4.2 meq/L (ref 3.5–5.1)
Sodium: 139 mEq/L (ref 135–145)

## 2015-05-19 NOTE — Progress Notes (Signed)
OFFICE VISIT  05/19/2015   CC:  Chief Complaint  Patient presents with  . Follow-up    Pt is fasting.   HPI:    Patient is a 69 y.o. Caucasian male who presents for 6 mo f/u hyperlipidemia, HTN, nonischemic CM, anxiety. Feeling good, no acute complaints.   Exercise capacity: he uses chainsaw, blows and rakes leaves, stacks the wood:  None of this makes him SOB.   No PND or orthopnea.  No falls. No LE swelling.  Home bp monitoring: all normal. Compliant with statin, no side effect. He no longer smokes cigs: quit several years ago. Anxiety stable on current med.   Past Medical History  Diagnosis Date  . Anxiety   . Diverticulosis   . Arthritis     L-Spine  . Hypertension   . Cardiomyopathy, nonischemic ([place]) 10/12/10    Global hypokinesis, EF 40-45% by cath and echo  . Single vessel coronary artery disease 10/12/10    80% OM lesion, med mgmt per cards  . Aortic stenosis, mild 10/12/10    cath and echo  . CARDIAC MURMUR 08/13/2010  . ABNORMAL ELECTROCARDIOGRAM 07/19/2010  . Hx of colonoscopy 2004; 2014    Initial was normal; recall TCS--polypectomy and diverticulosis  . TOBACCO ABUSE 08/13/2010  . Bronchitis 02/05/2011  . Obesity, Class I, BMI 30-34.9   . Gout     Usually Right great toe; colchicine helps well  . Hyperlipidemia 10/23/2012    Past Surgical History  Procedure Laterality Date  . Cardiac catheterization  10/12/10    diagnostic only: 1 vessel CAD with TIMI 3 flow, EF 40-45%.  Mild AS.  Medical mgmt of CAD.  . Transthoracic echocardiogram  09/2010    EF 40-45%, global hypokinesis, mild AS  . Colonoscopy w/ polypectomy  10/06/12    Diverticulosis and tubular adenoma w/out high grade dysplasia.  Repeat 5 yrs (Dr. Deatra Ina).    Outpatient Prescriptions Prior to Visit  Medication Sig Dispense Refill  . aspirin 81 MG tablet Take 81 mg by mouth daily.      Marland Kitchen atorvastatin (LIPITOR) 20 MG tablet Take 1 tablet by mouth  daily 90 tablet 1  . BYSTOLIC 10 MG tablet Take 1  tablet by mouth  every day 90 tablet 1  . colchicine 0.6 MG tablet 2 tabs po at onset of gout pain, then 1 tab po 1 hour later, then 1 tab twice per day until gout flare has resolved. 30 tablet 1  . FLUoxetine (PROZAC) 20 MG capsule 20 mg daily.    . hydrochlorothiazide (HYDRODIURIL) 25 MG tablet Take 1 tablet by mouth once a day 90 tablet 1  . irbesartan (AVAPRO) 150 MG tablet Take 1 tablet by mouth at  bedtime 90 tablet 1  . colchicine 0.6 MG tablet TAKE TWO TABLETS BY MOUTH AT ONSET OF GOUT PAIN, THEN TAKE ONE TABLET ONE HOUR LATER 30 tablet 1  . predniSONE (DELTASONE) 20 MG tablet 2 tabs po qd x 5d (Patient not taking: Reported on 05/19/2015) 10 tablet 0   No facility-administered medications prior to visit.    Allergies  Allergen Reactions  . Penicillins Swelling    ROS As per HPI  PE: Blood pressure 122/70, pulse 59, temperature 98.1 F (36.7 C), temperature source Oral, resp. rate 16, height 5\' 6"  (1.676 m), weight 173 lb (78.472 kg), SpO2 98 %. Gen: Alert, well appearing.  Patient is oriented to person, place, time, and situation. WTU:UEKC: no injection, icteris, swelling, or exudate.  EOMI,  PERRLA. Mouth: lips without lesion/swelling.  Oral mucosa pink and moist. Oropharynx without erythema, exudate, or swelling.  CV: RRR, soft syst murmur, without /r/g.   LUNGS: CTA bilat, nonlabored resps, good aeration in all lung fields. EXT: no clubbing, cyanosis, or edema.  DP and PT pulses 2+.  LABS:  Lab Results  Component Value Date   TSH 1.32 11/18/2014   Lab Results  Component Value Date   WBC 3.5* 10/05/2010   HGB 14.5 10/05/2010   HCT 40.9 10/05/2010   MCV 94.7 10/05/2010   PLT 188.0 10/05/2010   Lab Results  Component Value Date   CREATININE 0.82 11/18/2014   BUN 15 11/18/2014   NA 138 11/18/2014   K 4.2 11/18/2014   CL 101 11/18/2014   CO2 32 11/18/2014   Lab Results  Component Value Date   ALT 19 11/18/2014   AST 19 11/18/2014   ALKPHOS 64 11/18/2014    BILITOT 1.0 11/18/2014   Lab Results  Component Value Date   CHOL 152 03/18/2014   Lab Results  Component Value Date   HDL 69.00 03/18/2014   Lab Results  Component Value Date   LDLCALC 75 03/18/2014   Lab Results  Component Value Date   TRIG 40.0 03/18/2014   Lab Results  Component Value Date   CHOLHDL 2 03/18/2014   Lab Results  Component Value Date   PSA 1.59 07/22/2014   PSA 1.28 04/23/2013   PSA 1.24 08/16/2011    IMPRESSION AND PLAN:  1) HTN; The current medical regimen is effective;  continue present plan and medications. Lytes/cr today.  2) Hyperlipidemia: The current medical regimen is effective;  continue present plan and medications. Check FLP today.  3) Generalized anxiety: The current medical regimen is effective;  continue present plan and medications.  4) Non-ischemic CM: great exercise capacity, doing well on current meds, no sign of fluid overload.  An After Visit Summary was printed and given to the patient.  FOLLOW UP: Return in about 6 months (around 11/16/2015) for medicare AWV.

## 2015-05-19 NOTE — Progress Notes (Signed)
Pre visit review using our clinic review tool, if applicable. No additional management support is needed unless otherwise documented below in the visit note. 

## 2015-06-26 ENCOUNTER — Other Ambulatory Visit: Payer: Self-pay | Admitting: Family Medicine

## 2015-06-27 NOTE — Telephone Encounter (Signed)
LOV: 05/19/15 NOV: 11/17/15  RF request for hctz Last written: 12/05/14 #90 w/ 1RF  RF request for bystolic Last written: 0000000 #90 w/ 1RF

## 2015-07-12 ENCOUNTER — Encounter: Payer: Self-pay | Admitting: *Deleted

## 2015-11-17 ENCOUNTER — Ambulatory Visit: Payer: Medicare Other | Admitting: Family Medicine

## 2015-12-08 ENCOUNTER — Encounter: Payer: Self-pay | Admitting: Family Medicine

## 2015-12-08 ENCOUNTER — Ambulatory Visit (INDEPENDENT_AMBULATORY_CARE_PROVIDER_SITE_OTHER): Payer: Medicare Other | Admitting: Family Medicine

## 2015-12-08 VITALS — BP 137/72 | HR 56 | Temp 98.0°F | Resp 16 | Ht 65.0 in | Wt 176.5 lb

## 2015-12-08 DIAGNOSIS — Z131 Encounter for screening for diabetes mellitus: Secondary | ICD-10-CM | POA: Diagnosis not present

## 2015-12-08 DIAGNOSIS — Z125 Encounter for screening for malignant neoplasm of prostate: Secondary | ICD-10-CM

## 2015-12-08 DIAGNOSIS — I5022 Chronic systolic (congestive) heart failure: Secondary | ICD-10-CM

## 2015-12-08 DIAGNOSIS — Z0000 Encounter for general adult medical examination without abnormal findings: Secondary | ICD-10-CM | POA: Insufficient documentation

## 2015-12-08 LAB — COMPREHENSIVE METABOLIC PANEL
ALK PHOS: 58 U/L (ref 39–117)
ALT: 18 U/L (ref 0–53)
AST: 17 U/L (ref 0–37)
Albumin: 4.5 g/dL (ref 3.5–5.2)
BILIRUBIN TOTAL: 1.2 mg/dL (ref 0.2–1.2)
BUN: 14 mg/dL (ref 6–23)
CO2: 28 mEq/L (ref 19–32)
CREATININE: 0.92 mg/dL (ref 0.40–1.50)
Calcium: 9.3 mg/dL (ref 8.4–10.5)
Chloride: 104 mEq/L (ref 96–112)
GFR: 86.63 mL/min (ref >60.00)
GLUCOSE: 98 mg/dL (ref 70–99)
Potassium: 4.3 mEq/L (ref 3.5–5.1)
Sodium: 139 mEq/L (ref 135–145)
TOTAL PROTEIN: 6.4 g/dL (ref 6.0–8.3)

## 2015-12-08 LAB — PSA, MEDICARE: PSA: 1.42 ng/ml (ref 0.10–4.00)

## 2015-12-08 NOTE — Progress Notes (Signed)
The patient is here for annual Medicare wellness examination and management of other chronic and acute problems. Other problems discussed today:  Lab Results  Component Value Date   TSH 1.32 11/18/2014   Lab Results  Component Value Date   WBC 3.5* 10/05/2010   HGB 14.5 10/05/2010   HCT 40.9 10/05/2010   MCV 94.7 10/05/2010   PLT 188.0 10/05/2010   Lab Results  Component Value Date   CREATININE 0.86 05/19/2015   BUN 12 05/19/2015   NA 139 05/19/2015   K 4.2 05/19/2015   CL 103 05/19/2015   CO2 28 05/19/2015   Lab Results  Component Value Date   ALT 19 11/18/2014   AST 19 11/18/2014   ALKPHOS 64 11/18/2014   BILITOT 1.0 11/18/2014   Lab Results  Component Value Date   CHOL 161 05/19/2015   Lab Results  Component Value Date   HDL 70.30 05/19/2015   Lab Results  Component Value Date   LDLCALC 82 05/19/2015   Lab Results  Component Value Date   TRIG 46.0 05/19/2015   Lab Results  Component Value Date   CHOLHDL 2 05/19/2015   Lab Results  Component Value Date   PSA 1.59 07/22/2014   PSA 1.28 04/23/2013   PSA 1.24 08/16/2011    EXAM today: BP 137/72 mmHg  Pulse 56  Temp(Src) 98 F (36.7 C) (Oral)  Resp 16  Ht 5\' 5"  (1.651 m)  Wt 176 lb 8 oz (80.06 kg)  BMI 29.37 kg/m2  SpO2 96% Gen: Alert, well appearing.  Patient is oriented to person, place, time, and situation. Rectal exam: negative without mass, lesions or tenderness, PROSTATE EXAM: smooth and symmetric without nodules or tenderness.     AWV DATA The risk factors are reflected in the social history.  The roster of all physicians providing medical care to patient is listed in the Snapshot section of the chart.  Activities of daily living:  The patient is 100% independent in all ADLs: dressing, toileting, feeding as well as independent mobility.  Home safety : The patient has smoke detectors in the home. They wear seatbelts. No firearms at home ( firearms are present in the home, kept in a  safe fashion). There is no violence in the home.   There is no risks for hepatitis, STDs or HIV. There is no history of blood transfusion. They have no travel history to infectious disease endemic areas of the world.  The patient has seen their dentist in the last six month--due now. They have not seen their eye doctor in the last year. They deny any hearing difficulty and have not had audiologic testing in the last year.  They do not  have excessive sun exposure. Discussed the need for sun protection: hats, long sleeves and use of sunscreen if there is significant sun exposure.   Diet: the importance of a healthy diet is discussed. They do have a moderately healthy diet.    The patient does not have a regular exercise regimen.  He stays active gardening/doing yard work.  The benefits of regular aerobic exercise were discussed.  Depression screen: there are no signs or vegative symptoms of depression- irritability, change in appetite, anhedonia, sadness/tearfullness.  Cognitive assessment: the patient manages all their financial and personal affairs and is actively engaged. They could relate day,date,year and events; recalled 3/3 objects at 3 minutes; performed clock-face test normally.  Reviewed advance directives with pt today.  Pt has all of this in place.  The  following portions of the patient's history were reviewed and updated as appropriate: allergies, current medications, past family history, past medical history,  past surgical history, past social history  and problem list.  Vision, hearing, body mass index were assessed and reviewed. Body mass index is 29.37 kg/(m^2).   During the course of the visit the patient was educated and counseled about appropriate screening and preventive services including :  Annual wellness visit --today. diabetes screening-will do this today. colorectal cancer screening--planned for 2019 recommended immunizations (influenza, pneumococcal, Hep B):   Bone mass measurement: N/A Counseling to prevent tobacco use: N/A Depression screening: N/A Glaucoma screening: done by eye MD Hepatitis C virus screening: N/A HIV virus screening: No need. Lung cancer screening: does not qualify Medical nutrition therapy: N/A Prostate cancer screening: pt elects to do screening today and then d/c screening after this year. Screening mammography: N/A Screening pap tests, pelvic exam, and clinical breast exam: N/A Ultrasound screening for AAA: pt does not qualify  A written plan of action regarding the above screening and preventative services was given to the patient today. We did diabetes screening and prostate cancer screening (DRE and drew blood for medicare PSA). This will be his last prostate cancer screen.  I also drew CMET to monitor his meds/chf today.  An After Visit Summary was printed and given to the patient.  Signed:  Crissie Sickles, MD           12/08/2015

## 2015-12-08 NOTE — Progress Notes (Signed)
Pre visit review using our clinic review tool, if applicable. No additional management support is needed unless otherwise documented below in the visit note. 

## 2015-12-22 ENCOUNTER — Other Ambulatory Visit: Payer: Self-pay | Admitting: Family Medicine

## 2015-12-22 NOTE — Telephone Encounter (Signed)
LOV: 12/08/15 NOV: 05/17/16  RF request for irbesartan Last written: 04/24/15 #90 w/ 1RF  RF request for atorvastatin Last written: 04/24/15 #90 w/ 1RF

## 2016-05-17 ENCOUNTER — Encounter: Payer: Medicare Other | Admitting: Family Medicine

## 2016-06-12 ENCOUNTER — Encounter: Payer: Self-pay | Admitting: Family Medicine

## 2016-06-14 ENCOUNTER — Encounter: Payer: Self-pay | Admitting: Family Medicine

## 2016-06-14 ENCOUNTER — Ambulatory Visit (INDEPENDENT_AMBULATORY_CARE_PROVIDER_SITE_OTHER): Payer: Medicare Other | Admitting: Family Medicine

## 2016-06-14 VITALS — BP 143/70 | HR 59 | Temp 98.7°F | Resp 16 | Ht 65.0 in | Wt 177.8 lb

## 2016-06-14 DIAGNOSIS — I428 Other cardiomyopathies: Secondary | ICD-10-CM

## 2016-06-14 DIAGNOSIS — E7800 Pure hypercholesterolemia, unspecified: Secondary | ICD-10-CM

## 2016-06-14 DIAGNOSIS — I10 Essential (primary) hypertension: Secondary | ICD-10-CM

## 2016-06-14 LAB — LIPID PANEL
CHOLESTEROL: 160 mg/dL (ref 0–200)
HDL: 75.7 mg/dL (ref >39.00)
LDL Cholesterol: 74 mg/dL (ref 0–99)
NONHDL: 84.41
Total CHOL/HDL Ratio: 2
Triglycerides: 50 mg/dL (ref 0.0–149.0)
VLDL: 10 mg/dL (ref 0.0–40.0)

## 2016-06-14 LAB — COMPREHENSIVE METABOLIC PANEL
ALT: 19 U/L (ref 0–53)
AST: 20 U/L (ref 0–37)
Albumin: 4.5 g/dL (ref 3.5–5.2)
Alkaline Phosphatase: 65 U/L (ref 39–117)
BUN: 14 mg/dL (ref 6–23)
CALCIUM: 9.2 mg/dL (ref 8.4–10.5)
CO2: 27 mEq/L (ref 19–32)
Chloride: 105 mEq/L (ref 96–112)
Creatinine, Ser: 0.95 mg/dL (ref 0.40–1.50)
GFR: 83.35 mL/min (ref >60.00)
Glucose, Bld: 99 mg/dL (ref 70–99)
POTASSIUM: 4.7 meq/L (ref 3.5–5.1)
Sodium: 140 mEq/L (ref 135–145)
TOTAL PROTEIN: 6.6 g/dL (ref 6.0–8.3)
Total Bilirubin: 0.9 mg/dL (ref 0.2–1.2)

## 2016-06-14 MED ORDER — COLCHICINE 0.6 MG PO TABS: ORAL_TABLET | ORAL | 1 refills | Status: DC

## 2016-06-14 NOTE — Progress Notes (Signed)
Pre visit review using our clinic review tool, if applicable. No additional management support is needed unless otherwise documented below in the visit note. 

## 2016-06-14 NOTE — Progress Notes (Signed)
OFFICE VISIT  06/14/2016   CC:  Chief Complaint  Patient presents with  . Follow-up    Pt is fasting.    HPI:    Patient is a 70 y.o. Caucasian male who presents for follow up HLD, HTN, nonischemic CM, anxiety.  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  No swelling in legs.    Monitoring of BP: 120s/70s.  Taking atorvastatin daily w/out problem. Not taking ASA regularly b/c of bleeding more when he cuts himself.  Anxiety well controlled on prozac, seeing Dr. Toy Care in psychiatry.    Past Medical History:  Diagnosis Date  . ABNORMAL ELECTROCARDIOGRAM 07/19/2010  . Aortic stenosis, mild 10/12/10   cath and echo  . Arthritis    L-Spine  . Bronchitis 02/05/2011  . CARDIAC MURMUR 08/13/2010  . Cardiomyopathy, nonischemic ([place]) 10/12/10   Global hypokinesis, EF 40-45% by cath and echo  . Diverticulosis   . GAD (generalized anxiety disorder)    managed by Dr. Toy Care with prozac 20mg  qd  . Gout    Usually Right great toe; colchicine helps well  . Hx of colonoscopy 2004; 2014   Initial was normal; 2014-polypectomy and diverticulosis.  Recall 2019.  Marland Kitchen Hyperlipidemia 10/23/2012  . Hypertension   . Obesity, Class I, BMI 30-34.9   . Single vessel coronary artery disease 10/12/10   80% OM lesion, med mgmt per cards  . TOBACCO ABUSE 08/13/2010    Past Surgical History:  Procedure Laterality Date  . CARDIAC CATHETERIZATION  10/12/10   diagnostic only: 1 vessel CAD with TIMI 3 flow, EF 40-45%.  Mild AS.  Medical mgmt of CAD.  Marland Kitchen COLONOSCOPY W/ POLYPECTOMY  10/06/12   Diverticulosis and tubular adenoma w/out high grade dysplasia.  Repeat 5 yrs (Dr. Deatra Ina).  . TRANSTHORACIC ECHOCARDIOGRAM  09/2010   EF 40-45%, global hypokinesis, mild AS   Family History  Problem Relation Age of Onset  . Alzheimer's disease Mother   . COPD Father   . Heart failure Father     congestive  . Other Father     CHF  . Congenital heart disease Brother      cardiomyopathy  . Other Brother     CHF/ cardiolyopathy  . Colon cancer Neg Hx   . Esophageal cancer Neg Hx   . Stomach cancer Neg Hx   . Rectal cancer Neg Hx    Social History   Social History Narrative   Delivery driver.  Married, two children.   Long hx of smoking but quit after 2010.    Outpatient Medications Prior to Visit  Medication Sig Dispense Refill  . aspirin 81 MG tablet Take 81 mg by mouth daily.      Marland Kitchen atorvastatin (LIPITOR) 20 MG tablet TAKE 1 TABLET DAILY 90 tablet 3  . BYSTOLIC 10 MG tablet Take 1 tablet by mouth  every day 90 tablet 3  . FLUoxetine (PROZAC) 20 MG capsule 20 mg daily.    . hydrochlorothiazide (HYDRODIURIL) 25 MG tablet Take 1 tablet by mouth once a day 90 tablet 3  . irbesartan (AVAPRO) 150 MG tablet TAKE ONE TABLET AT BEDTIME 90 tablet 3  . colchicine 0.6 MG tablet 2 tabs po at onset of gout pain, then 1 tab po 1 hour later, then 1 tab twice per day until gout flare has resolved. 30 tablet 1   No facility-administered medications prior to visit.     Allergies  Allergen Reactions  . Penicillins Swelling    ROS As per HPI  PE: Blood pressure (!) 143/70, pulse (!) 59, temperature 98.7 F (37.1 C), temperature source Oral, resp. rate 16, height 5\' 5"  (1.651 m), weight 177 lb 12 oz (80.6 kg), SpO2 97 %. Gen: Alert, well appearing.  Patient is oriented to person, place, time, and situation. AFFECT: pleasant, lucid thought and speech. [id number]: no injection, icteris, swelling, or exudate.  EOMI, PERRLA. Mouth: lips without lesion/swelling.  Oral mucosa pink and moist. Oropharynx without erythema, exudate, or swelling.  CV: RRR, 2/6 systolic murmur.  No diastolic murmur.  No rub or gallop. Chest is clear, no wheezing or rales. Normal symmetric air entry throughout both lung fields. No chest wall deformities or tenderness. ABD: soft, NT, ND, BS normal.  No hepatospenomegaly or mass.  No bruits. EXT: no clubbing, cyanosis, or edema.     LABS:  Lab Results  Component Value Date   TSH 1.32 11/18/2014   Lab Results  Component Value Date   WBC 3.5 (L) 10/05/2010   HGB 14.5 10/05/2010   HCT 40.9 10/05/2010   MCV 94.7 10/05/2010   PLT 188.0 10/05/2010   Lab Results  Component Value Date   CREATININE 0.92 12/08/2015   BUN 14 12/08/2015   NA 139 12/08/2015   K 4.3 12/08/2015   CL 104 12/08/2015   CO2 28 12/08/2015   Lab Results  Component Value Date   ALT 18 12/08/2015   AST 17 12/08/2015   ALKPHOS 58 12/08/2015   BILITOT 1.2 12/08/2015   Lab Results  Component Value Date   CHOL 161 05/19/2015   Lab Results  Component Value Date   HDL 70.30 05/19/2015   Lab Results  Component Value Date   LDLCALC 82 05/19/2015   Lab Results  Component Value Date   TRIG 46.0 05/19/2015   Lab Results  Component Value Date   CHOLHDL 2 05/19/2015   Lab Results  Component Value Date   PSA 1.42 12/08/2015   PSA 1.59 07/22/2014   PSA 1.28 04/23/2013    IMPRESSION AND PLAN:  1) HTN: The current medical regimen is effective;  continue present plan and medications. Check lytes/cr today.  2) Nonischemic CM: asymptomatic, no sign of volume overload.  Continue current meds. Pt was encouraged to do a routine f/u with his cardiologist, Dr. Ross---I gave pt her office phone # today.  3) HLD: tolerating statin.  Check AST/ALT and FLP today.  An After Visit Summary was printed and given to the patient.  FOLLOW UP: Return in about 6 months (around 12/13/2016) for routine chronic illness f/u.  Signed:  Crissie Sickles, MD           06/14/2016

## 2016-07-16 ENCOUNTER — Other Ambulatory Visit: Payer: Self-pay | Admitting: Family Medicine

## 2016-11-20 ENCOUNTER — Other Ambulatory Visit: Payer: Self-pay | Admitting: Family Medicine

## 2016-12-13 ENCOUNTER — Encounter: Payer: Self-pay | Admitting: Family Medicine

## 2016-12-13 ENCOUNTER — Ambulatory Visit (INDEPENDENT_AMBULATORY_CARE_PROVIDER_SITE_OTHER): Payer: Medicare Other | Admitting: Family Medicine

## 2016-12-13 VITALS — BP 123/61 | HR 59 | Temp 98.1°F | Resp 16 | Ht 65.0 in | Wt 171.5 lb

## 2016-12-13 DIAGNOSIS — Z23 Encounter for immunization: Secondary | ICD-10-CM

## 2016-12-13 DIAGNOSIS — E7800 Pure hypercholesterolemia, unspecified: Secondary | ICD-10-CM

## 2016-12-13 DIAGNOSIS — I428 Other cardiomyopathies: Secondary | ICD-10-CM

## 2016-12-13 DIAGNOSIS — I2510 Atherosclerotic heart disease of native coronary artery without angina pectoris: Secondary | ICD-10-CM | POA: Diagnosis not present

## 2016-12-13 DIAGNOSIS — I10 Essential (primary) hypertension: Secondary | ICD-10-CM

## 2016-12-13 LAB — LIPID PANEL
CHOLESTEROL: 146 mg/dL (ref 0–200)
HDL: 68.4 mg/dL (ref >39.00)
LDL Cholesterol: 67 mg/dL (ref 0–99)
NonHDL: 77.67
TRIGLYCERIDES: 51 mg/dL (ref 0.0–149.0)
Total CHOL/HDL Ratio: 2
VLDL: 10.2 mg/dL (ref 0.0–40.0)

## 2016-12-13 LAB — BASIC METABOLIC PANEL
BUN: 14 mg/dL (ref 6–23)
CALCIUM: 9 mg/dL (ref 8.4–10.5)
CO2: 30 mEq/L (ref 19–32)
CREATININE: 0.99 mg/dL (ref 0.40–1.50)
Chloride: 102 mEq/L (ref 96–112)
GFR: 79.37 mL/min (ref >60.00)
Glucose, Bld: 91 mg/dL (ref 70–99)
Potassium: 4.1 mEq/L (ref 3.5–5.1)
Sodium: 139 mEq/L (ref 135–145)

## 2016-12-13 NOTE — Progress Notes (Signed)
OFFICE VISIT  12/13/2016   CC:  Chief Complaint  Patient presents with  . Follow-up    RCI, pt is fasting.    HPI:    Patient is a 71 y.o. Caucasian male who presents for 6 mo f/u HTN, HLD, CAD managed medically--with nonischemic CM.  HTN: home bp monitoring consistently <130/80.  HLD: tolerating atorv 20mg  qd w/out side effect.  CAD: Has been doing some walking for exercise: no CP or SOB.  No LE swelling, no palpitations, no dizziness, no HAs. He has not followed up with cardiologist, Dr. Harrington Challenger, and says he doesn't plan to b/c "getting to [place] is too troublesome".  Review of Systems  Constitutional: Negative for fatigue and fever.  HENT: Negative for congestion and sore throat.   Eyes: Negative for visual disturbance.  Respiratory: Negative for cough.   Cardiovascular: Negative for chest pain.  Gastrointestinal: Negative for abdominal pain and nausea.  Genitourinary: Negative for dysuria.  Musculoskeletal: Negative for back pain and joint swelling.  Skin: Negative for rash.  Neurological: Negative for weakness and headaches.  Hematological: Negative for adenopathy.     Past Medical History:  Diagnosis Date  . ABNORMAL ELECTROCARDIOGRAM 07/19/2010  . Aortic stenosis, mild 10/12/10   cath and echo  . Arthritis    L-Spine  . Bronchitis 02/05/2011  . CARDIAC MURMUR 08/13/2010  . Cardiomyopathy, nonischemic ([place]) 10/12/10   Global hypokinesis, EF 40-45% by cath and echo  . Diverticulosis   . GAD (generalized anxiety disorder)    managed by Dr. Toy Care with prozac 20mg  qd  . Gout    Usually Right great toe; colchicine helps well  . Hx of colonoscopy 2004; 2014   Initial was normal; 2014-polypectomy and diverticulosis.  Recall 2019.  Marland Kitchen Hyperlipidemia 10/23/2012  . Hypertension   . Obesity, Class I, BMI 30-34.9   . Single vessel coronary artery disease 10/12/10   80% OM lesion, med mgmt per cards  . TOBACCO ABUSE 08/13/2010    Past Surgical History:  Procedure Laterality Date   . CARDIAC CATHETERIZATION  10/12/10   diagnostic only: 1 vessel CAD with TIMI 3 flow, EF 40-45%.  Mild AS.  Medical mgmt of CAD.  Marland Kitchen COLONOSCOPY W/ POLYPECTOMY  10/06/12   Diverticulosis and tubular adenoma w/out high grade dysplasia.  Repeat 5 yrs (Dr. Deatra Ina).  . TRANSTHORACIC ECHOCARDIOGRAM  09/2010   EF 40-45%, global hypokinesis, mild AS    Outpatient Medications Prior to Visit  Medication Sig Dispense Refill  . aspirin 81 MG tablet Take 81 mg by mouth daily.      Marland Kitchen atorvastatin (LIPITOR) 20 MG tablet TAKE 1 TABLET DAILY 90 tablet 3  . BYSTOLIC 10 MG tablet TAKE 1 TABLET DAILY 90 tablet 1  . Colchicine 0.6 MG CAPS TAKE 2 CAPSULES AT ONSET OF GOUT PAIN, THEN 1 CAPSULE 1 HOUR LATER 30 capsule 0  . FLUoxetine (PROZAC) 20 MG capsule 20 mg daily.    . hydrochlorothiazide (HYDRODIURIL) 25 MG tablet TAKE 1 TABLET DAILY 90 tablet 1  . irbesartan (AVAPRO) 150 MG tablet TAKE ONE TABLET AT BEDTIME 90 tablet 3  . colchicine 0.6 MG tablet 2 tabs po at onset of gout pain, then 1 tab po 1 hour later, then 1 tab twice per day until gout flare has resolved. (Patient not taking: Reported on 12/13/2016) 30 tablet 1   No facility-administered medications prior to visit.     Allergies  Allergen Reactions  . Penicillins Swelling    ROS As per HPI  PE: Blood pressure 123/61, pulse (!) 59, temperature 98.1 F (36.7 C), temperature source Oral, resp. rate 16, height 5\' 5"  (1.651 m), weight 171 lb 8 oz (77.8 kg), SpO2 99 %. Body mass index is 28.54 kg/m.  Gen: Alert, well appearing.  Patient is oriented to person, place, time, and situation. AFFECT: pleasant, lucid thought and speech. CV: RRR, soft systolic flow murmur at RUSB, no r/g.   LUNGS: CTA bilat, nonlabored resps, good aeration in all lung fields. EXT: no clubbing, cyanosis, or edema.   LABS:  Lab Results  Component Value Date   TSH 1.32 11/18/2014   Lab Results  Component Value Date   WBC 3.5 (L) 10/05/2010   HGB 14.5 10/05/2010    HCT 40.9 10/05/2010   MCV 94.7 10/05/2010   PLT 188.0 10/05/2010   Lab Results  Component Value Date   CREATININE 0.95 06/14/2016   BUN 14 06/14/2016   NA 140 06/14/2016   K 4.7 06/14/2016   CL 105 06/14/2016   CO2 27 06/14/2016   Lab Results  Component Value Date   ALT 19 06/14/2016   AST 20 06/14/2016   ALKPHOS 65 06/14/2016   BILITOT 0.9 06/14/2016   Lab Results  Component Value Date   CHOL 160 06/14/2016   Lab Results  Component Value Date   HDL 75.70 06/14/2016   Lab Results  Component Value Date   LDLCALC 74 06/14/2016   Lab Results  Component Value Date   TRIG 50.0 06/14/2016   Lab Results  Component Value Date   CHOLHDL 2 06/14/2016   Lab Results  Component Value Date   PSA 1.42 12/08/2015   PSA 1.59 07/22/2014   PSA 1.28 04/23/2013   IMPRESSION AND PLAN:  1) HTN; The current medical regimen is effective;  continue present plan and medications. Lytes/cr today.  2) HLD: tolerating statin.  FLP today.  AST/ALT normal 6 mo ago.  3) CAD with nonischemic CM--asymptomatic.  Continue medical mgmt, exercise, healthy diet. Pt states he is not going to f/u with his cardiologist b/c he feels well AND it is too inconvenient for him to go to [place] for this.  4) Preventative health care: pneumovax 23 (last one) given today. Shingrix rx given to pt to take to pharmacy.  An After Visit Summary was printed and given to the patient.  FOLLOW UP: Return in about 6 months (around 06/14/2017) for annual CPE (fasting).  Pt declined to schedule medicare AWV today.  Signed:  Crissie Sickles, MD           12/13/2016

## 2017-03-12 ENCOUNTER — Encounter: Payer: Self-pay | Admitting: Family Medicine

## 2017-03-12 ENCOUNTER — Ambulatory Visit (INDEPENDENT_AMBULATORY_CARE_PROVIDER_SITE_OTHER): Payer: Medicare Other | Admitting: Family Medicine

## 2017-03-12 VITALS — BP 138/58 | HR 55 | Temp 98.2°F | Resp 16 | Ht 65.0 in | Wt 171.8 lb

## 2017-03-12 DIAGNOSIS — K625 Hemorrhage of anus and rectum: Secondary | ICD-10-CM

## 2017-03-12 DIAGNOSIS — R197 Diarrhea, unspecified: Secondary | ICD-10-CM | POA: Diagnosis not present

## 2017-03-12 DIAGNOSIS — I2510 Atherosclerotic heart disease of native coronary artery without angina pectoris: Secondary | ICD-10-CM

## 2017-03-12 DIAGNOSIS — K921 Melena: Secondary | ICD-10-CM

## 2017-03-12 LAB — COMPREHENSIVE METABOLIC PANEL
ALBUMIN: 4.5 g/dL (ref 3.5–5.2)
ALK PHOS: 61 U/L (ref 39–117)
ALT: 16 U/L (ref 0–53)
AST: 20 U/L (ref 0–37)
BUN: 14 mg/dL (ref 6–23)
CHLORIDE: 102 meq/L (ref 96–112)
CO2: 31 mEq/L (ref 19–32)
Calcium: 9.4 mg/dL (ref 8.4–10.5)
Creatinine, Ser: 1 mg/dL (ref 0.40–1.50)
GFR: 78.39 mL/min (ref >60.00)
Glucose, Bld: 88 mg/dL (ref 70–99)
POTASSIUM: 4.3 meq/L (ref 3.5–5.1)
SODIUM: 139 meq/L (ref 135–145)
TOTAL PROTEIN: 6.5 g/dL (ref 6.0–8.3)
Total Bilirubin: 1 mg/dL (ref 0.2–1.2)

## 2017-03-12 LAB — CBC WITH DIFFERENTIAL/PLATELET
BASOS ABS: 0 10*3/uL (ref 0.0–0.1)
Basophils Relative: 1 % (ref 0.0–3.0)
Eosinophils Absolute: 0.2 10*3/uL (ref 0.0–0.7)
Eosinophils Relative: 4.8 % (ref 0.0–5.0)
HCT: 41 % (ref 39.0–52.0)
Hemoglobin: 14.1 g/dL (ref 13.0–17.0)
LYMPHS ABS: 0.8 10*3/uL (ref 0.7–4.0)
LYMPHS PCT: 20.2 % (ref 12.0–46.0)
MCHC: 34.4 g/dL (ref 30.0–36.0)
MCV: 94.7 fl (ref 78.0–100.0)
MONOS PCT: 7.8 % (ref 3.0–12.0)
Monocytes Absolute: 0.3 10*3/uL (ref 0.1–1.0)
NEUTROS ABS: 2.8 10*3/uL (ref 1.4–7.7)
Neutrophils Relative %: 66.2 % (ref 43.0–77.0)
Platelets: 214 10*3/uL (ref 150.0–400.0)
RBC: 4.33 Mil/uL (ref 4.22–5.81)
RDW: 13.9 % (ref 11.5–15.5)
WBC: 4.2 10*3/uL (ref 4.0–10.5)

## 2017-03-12 LAB — POC HEMOCCULT BLD/STL (OFFICE/1-CARD/DIAGNOSTIC)
Fecal Occult Blood, POC: NEGATIVE
OCCULT BLOOD DATE: 8292018

## 2017-03-12 NOTE — Patient Instructions (Addendum)
Take over the counter generic imodium: adult dose as on packaging--1 to 2 times per day. Take one probiotic once daily (make sure it says GI or gastrointestinal on the bottle)--any brand. Take metamucil (fiber supplement)--one dose every day. Avoid all dairy products and sugary foods.

## 2017-03-12 NOTE — Progress Notes (Signed)
OFFICE VISIT  03/12/2017   CC:  Chief Complaint  Patient presents with  . Blood In Stools    and greasy stool?   HPI:    Patient is a 71 y.o.  male who presents for stool abnormality. Last couple months, he notes stool is very watery, 1-3 per day, somewhat greasy appearance.  No nocturnal BM urges.  Sometimes dark colored stool, w/ consistency of melena.  He does see blood on toilet tissue sometimes when/after wiping.   No BRB in toilet water.  No anal pain. Preceding the last 2 mo, he was noticing the tendency for his stools to be loose but not watery like now. He hears some abdominal girgling but he denies any abd pain/cramping, bloating, or n/v.  Occ feels like when he urinates his bowels empty some uncontrollably.  He is afraid to "pass gas" b/c he has had occasion when he has lost some stool when this happened. No preceding change in diet and no changes since onset of sx's.  No one around him with same sx's.  No recent travel.  No recent camping. No low back pain, no LE weakness, no saddle anesthesia, no paresthesias.  Very seldom takes NSAIDs.  No antibiotics prior to onset of loose stools.  He has not tried any otc anti-diarrhea med.   Past Medical History:  Diagnosis Date  . ABNORMAL ELECTROCARDIOGRAM 07/19/2010  . Aortic stenosis, mild 10/12/10   cath and echo  . Arthritis    L-Spine  . Bronchitis 02/05/2011  . CARDIAC MURMUR 08/13/2010  . Cardiomyopathy, nonischemic ([place]) 10/12/10   Global hypokinesis, EF 40-45% by cath and echo  . Diverticulosis   . GAD (generalized anxiety disorder)    managed by Dr. Toy Care with prozac 20mg  qd  . Gout    Usually Right great toe; colchicine helps well  . Hx of colonoscopy 2004; 2014   Initial was normal; 2014-polypectomy and diverticulosis.  Recall 2019.  Marland Kitchen Hyperlipidemia 10/23/2012  . Hypertension   . Obesity, Class I, BMI 30-34.9   . Single vessel coronary artery disease 10/12/10   80% OM lesion, med mgmt per cards  . TOBACCO ABUSE  08/13/2010    Past Surgical History:  Procedure Laterality Date  . CARDIAC CATHETERIZATION  10/12/10   diagnostic only: 1 vessel CAD with TIMI 3 flow, EF 40-45%.  Mild AS.  Medical mgmt of CAD.  Marland Kitchen COLONOSCOPY W/ POLYPECTOMY  10/06/12   Diverticulosis and tubular adenoma w/out high grade dysplasia.  Repeat 5 yrs (Dr. Deatra Ina).  . TRANSTHORACIC ECHOCARDIOGRAM  09/2010   EF 40-45%, global hypokinesis, mild AS    Outpatient Medications Prior to Visit  Medication Sig Dispense Refill  . aspirin 81 MG tablet Take 81 mg by mouth daily.      Marland Kitchen atorvastatin (LIPITOR) 20 MG tablet TAKE 1 TABLET DAILY 90 tablet 3  . BYSTOLIC 10 MG tablet TAKE 1 TABLET DAILY 90 tablet 1  . Colchicine 0.6 MG CAPS TAKE 2 CAPSULES AT ONSET OF GOUT PAIN, THEN 1 CAPSULE 1 HOUR LATER 30 capsule 0  . FLUoxetine (PROZAC) 20 MG capsule 20 mg daily.    . hydrochlorothiazide (HYDRODIURIL) 25 MG tablet TAKE 1 TABLET DAILY 90 tablet 1  . irbesartan (AVAPRO) 150 MG tablet TAKE ONE TABLET AT BEDTIME 90 tablet 3   No facility-administered medications prior to visit.     Allergies  Allergen Reactions  . Penicillins Swelling    ROS As per HPI  PE: Blood pressure (!) 138/58, pulse Marland Kitchen)  55, temperature 98.2 F (36.8 C), temperature source Oral, resp. rate 16, height 5\' 5"  (1.651 m), weight 171 lb 12 oz (77.9 kg), SpO2 98 %. Gen: Alert, well appearing.  Patient is oriented to person, place, time, and situation. AFFECT: pleasant, lucid thought and speech. CV: RRR, soft systolic murmur, S1 and S2 clear.  No rub/gallop. Chest is clear, no wheezing or rales. Normal symmetric air entry throughout both lung fields. No chest wall deformities or tenderness. ABD: soft, NT, ND, BS normal.  No hepatospenomegaly or mass.  No bruits. EXT: no clubbing, cyanosis, or edema.   LABS:  Hemoccult in office today: neg  IMPRESSION AND PLAN:  Subacute diarrhea; malabsorption sounds possible. No real hard signs of infection but will do standard  stool studies. Hemoccult neg in office.  Suspect his occ BRB on tissue is outlet bleeding--reassured pt. Check CBC and CMET. Instructions: Take over the counter generic imodium: adult dose as on packaging--1 to 2 times per day. Take one probiotic once daily (make sure it says GI or gastrointestinal on the bottle)--any brand. Take metamucil (fiber supplement)--one dose every day. Avoid all dairy products and sugary foods.  An After Visit Summary was printed and given to the patient.  FOLLOW UP: Return in about 4 weeks (around 04/09/2017) for f/u diarrhea.  Signed:  Crissie Sickles, MD           03/12/2017

## 2017-03-13 ENCOUNTER — Other Ambulatory Visit: Payer: Self-pay | Admitting: *Deleted

## 2017-03-13 DIAGNOSIS — R197 Diarrhea, unspecified: Secondary | ICD-10-CM

## 2017-04-04 ENCOUNTER — Other Ambulatory Visit: Payer: Self-pay | Admitting: Family Medicine

## 2017-04-09 ENCOUNTER — Encounter: Payer: Self-pay | Admitting: Family Medicine

## 2017-04-09 ENCOUNTER — Ambulatory Visit (INDEPENDENT_AMBULATORY_CARE_PROVIDER_SITE_OTHER): Payer: Medicare Other | Admitting: Family Medicine

## 2017-04-09 VITALS — BP 119/59 | HR 57 | Temp 98.1°F | Resp 16 | Ht 65.0 in | Wt 171.5 lb

## 2017-04-09 DIAGNOSIS — R197 Diarrhea, unspecified: Secondary | ICD-10-CM | POA: Diagnosis not present

## 2017-04-09 DIAGNOSIS — I2510 Atherosclerotic heart disease of native coronary artery without angina pectoris: Secondary | ICD-10-CM

## 2017-04-09 NOTE — Progress Notes (Signed)
OFFICE VISIT  04/09/2017   CC:  Chief Complaint  Patient presents with  . Follow-up    diarrhea   HPI:    Patient is a 71 y.o. Caucasian male who presents for 1 mo f/u subacute diarrhea. Last visit I had him start metamucil daily, imodium prn, and a daily probiotic.  I also instructed him to avoid simple sugars and dairy products the best he could. Hemoccult neg in office at that time.  CBC and CMET normal.  Seems to be better. Has one bm per day, it is formed but real soft.  No blood or mucous in it. He is taking the metamucil and the probiotic and is not requiring any imodium. No abd pain.  He is limiting sugars and almost completely dairy-free diet. No abd pain or fevers.  No n/v.  Past Medical History:  Diagnosis Date  . ABNORMAL ELECTROCARDIOGRAM 07/19/2010  . Aortic stenosis, mild 10/12/10   cath and echo  . Arthritis    L-Spine  . Bronchitis 02/05/2011  . CARDIAC MURMUR 08/13/2010  . Cardiomyopathy, nonischemic ([place]) 10/12/10   Global hypokinesis, EF 40-45% by cath and echo  . Diverticulosis   . GAD (generalized anxiety disorder)    managed by Dr. Toy Care with prozac 20mg  qd  . Gout    Usually Right great toe; colchicine helps well  . Hx of colonoscopy 2004; 2014   Initial was normal; 2014-polypectomy and diverticulosis.  Recall 2019.  Marland Kitchen Hyperlipidemia 10/23/2012  . Hypertension   . Obesity, Class I, BMI 30-34.9   . Single vessel coronary artery disease 10/12/10   80% OM lesion, med mgmt per cards  . TOBACCO ABUSE 08/13/2010    Past Surgical History:  Procedure Laterality Date  . CARDIAC CATHETERIZATION  10/12/10   diagnostic only: 1 vessel CAD with TIMI 3 flow, EF 40-45%.  Mild AS.  Medical mgmt of CAD.  Marland Kitchen COLONOSCOPY W/ POLYPECTOMY  10/06/12   Diverticulosis and tubular adenoma w/out high grade dysplasia.  Repeat 5 yrs (Dr. Deatra Ina).  . TRANSTHORACIC ECHOCARDIOGRAM  09/2010   EF 40-45%, global hypokinesis, mild AS    Outpatient Medications Prior to Visit   Medication Sig Dispense Refill  . aspirin 81 MG tablet Take 81 mg by mouth daily.      Marland Kitchen atorvastatin (LIPITOR) 20 MG tablet TAKE 1 TABLET DAILY 90 tablet 1  . BYSTOLIC 10 MG tablet TAKE 1 TABLET DAILY 90 tablet 1  . Colchicine 0.6 MG CAPS TAKE 2 CAPSULES AT ONSET OF GOUT PAIN, THEN 1 CAPSULE 1 HOUR LATER 30 capsule 0  . FLUoxetine (PROZAC) 20 MG capsule 20 mg daily.    . hydrochlorothiazide (HYDRODIURIL) 25 MG tablet TAKE 1 TABLET DAILY 90 tablet 1  . irbesartan (AVAPRO) 150 MG tablet TAKE ONE TABLET AT BEDTIME 90 tablet 1   No facility-administered medications prior to visit.     Allergies  Allergen Reactions  . Penicillins Swelling    ROS As per HPI  PE: Blood pressure (!) 119/59, pulse (!) 57, temperature 98.1 F (36.7 C), temperature source Oral, resp. rate 16, height 5\' 5"  (1.651 m), weight 171 lb 8 oz (77.8 kg), SpO2 96 %. Gen: Alert, well appearing.  Patient is oriented to person, place, time, and situation. AFFECT: pleasant, lucid thought and speech. No further exam today.  LABS:  Lab Results  Component Value Date   WBC 4.2 03/12/2017   HGB 14.1 03/12/2017   HCT 41.0 03/12/2017   MCV 94.7 03/12/2017   PLT  214.0 03/12/2017     Chemistry      Component Value Date/Time   NA 139 03/12/2017 1111   K 4.3 03/12/2017 1111   CL 102 03/12/2017 1111   CO2 31 03/12/2017 1111   BUN 14 03/12/2017 1111   CREATININE 1.00 03/12/2017 1111      Component Value Date/Time   CALCIUM 9.4 03/12/2017 1111   ALKPHOS 61 03/12/2017 1111   AST 20 03/12/2017 1111   ALT 16 03/12/2017 1111   BILITOT 1.0 03/12/2017 1111       IMPRESSION AND PLAN:  Subacute diarrhea--much improved. Continue metamucil daily, continue probiotic qd x 1 more month. Try going back onto dairy in diet slowly and see if any sx's return.  If none return, then may resume normal simple sugars in diet and see if sx's return. Signs/symptoms to call or return for were reviewed and pt expressed  understanding.  An After Visit Summary was printed and given to the patient.  FOLLOW UP: Return for keep scheduled f/u 06/13/17..  Signed:  Crissie Sickles, MD           04/09/2017

## 2017-04-14 ENCOUNTER — Telehealth: Payer: Self-pay | Admitting: Family Medicine

## 2017-04-14 NOTE — Telephone Encounter (Signed)
Stool test results all reviewed: all NEGATIVE. -thx

## 2017-04-15 NOTE — Telephone Encounter (Signed)
Pt advised and voiced understanding.  Copy of results put up front for pt to pick up.

## 2017-06-13 ENCOUNTER — Encounter: Payer: Medicare Other | Admitting: Family Medicine

## 2017-08-01 ENCOUNTER — Ambulatory Visit (INDEPENDENT_AMBULATORY_CARE_PROVIDER_SITE_OTHER): Payer: Medicare Other | Admitting: Family Medicine

## 2017-08-01 ENCOUNTER — Encounter: Payer: Self-pay | Admitting: Family Medicine

## 2017-08-01 ENCOUNTER — Ambulatory Visit (INDEPENDENT_AMBULATORY_CARE_PROVIDER_SITE_OTHER): Payer: Medicare Other

## 2017-08-01 VITALS — BP 128/59 | HR 60 | Temp 98.2°F | Resp 16 | Ht 65.5 in | Wt 172.5 lb

## 2017-08-01 VITALS — BP 128/59 | HR 60 | Temp 98.2°F | Resp 16 | Ht 65.0 in | Wt 172.0 lb

## 2017-08-01 DIAGNOSIS — Z0000 Encounter for general adult medical examination without abnormal findings: Secondary | ICD-10-CM

## 2017-08-01 DIAGNOSIS — I2510 Atherosclerotic heart disease of native coronary artery without angina pectoris: Secondary | ICD-10-CM

## 2017-08-01 DIAGNOSIS — Z125 Encounter for screening for malignant neoplasm of prostate: Secondary | ICD-10-CM

## 2017-08-01 DIAGNOSIS — E7800 Pure hypercholesterolemia, unspecified: Secondary | ICD-10-CM

## 2017-08-01 DIAGNOSIS — R011 Cardiac murmur, unspecified: Secondary | ICD-10-CM

## 2017-08-01 DIAGNOSIS — Z0001 Encounter for general adult medical examination with abnormal findings: Secondary | ICD-10-CM | POA: Diagnosis not present

## 2017-08-01 DIAGNOSIS — I10 Essential (primary) hypertension: Secondary | ICD-10-CM | POA: Diagnosis not present

## 2017-08-01 DIAGNOSIS — I428 Other cardiomyopathies: Secondary | ICD-10-CM

## 2017-08-01 DIAGNOSIS — I38 Endocarditis, valve unspecified: Secondary | ICD-10-CM

## 2017-08-01 LAB — CBC WITH DIFFERENTIAL/PLATELET
BASOS PCT: 1 % (ref 0.0–3.0)
Basophils Absolute: 0 10*3/uL (ref 0.0–0.1)
Eosinophils Absolute: 0.2 10*3/uL (ref 0.0–0.7)
Eosinophils Relative: 4.4 % (ref 0.0–5.0)
HEMATOCRIT: 41.4 % (ref 39.0–52.0)
Hemoglobin: 14.2 g/dL (ref 13.0–17.0)
LYMPHS ABS: 1.1 10*3/uL (ref 0.7–4.0)
LYMPHS PCT: 26.2 % (ref 12.0–46.0)
MCHC: 34.3 g/dL (ref 30.0–36.0)
MCV: 93.9 fl (ref 78.0–100.0)
MONOS PCT: 9.1 % (ref 3.0–12.0)
Monocytes Absolute: 0.4 10*3/uL (ref 0.1–1.0)
NEUTROS PCT: 59.3 % (ref 43.0–77.0)
Neutro Abs: 2.5 10*3/uL (ref 1.4–7.7)
PLATELETS: 243 10*3/uL (ref 150.0–400.0)
RBC: 4.4 Mil/uL (ref 4.22–5.81)
RDW: 13.7 % (ref 11.5–15.5)
WBC: 4.2 10*3/uL (ref 4.0–10.5)

## 2017-08-01 LAB — COMPREHENSIVE METABOLIC PANEL
ALT: 17 U/L (ref 0–53)
AST: 21 U/L (ref 0–37)
Albumin: 4.5 g/dL (ref 3.5–5.2)
Alkaline Phosphatase: 70 U/L (ref 39–117)
BUN: 11 mg/dL (ref 6–23)
CALCIUM: 9.2 mg/dL (ref 8.4–10.5)
CHLORIDE: 100 meq/L (ref 96–112)
CO2: 30 mEq/L (ref 19–32)
Creatinine, Ser: 0.87 mg/dL (ref 0.40–1.50)
GFR: 91.96 mL/min (ref >60.00)
Glucose, Bld: 100 mg/dL — ABNORMAL HIGH (ref 70–99)
POTASSIUM: 3.9 meq/L (ref 3.5–5.1)
Sodium: 138 mEq/L (ref 135–145)
Total Bilirubin: 1.1 mg/dL (ref 0.2–1.2)
Total Protein: 6.6 g/dL (ref 6.0–8.3)

## 2017-08-01 LAB — LIPID PANEL
CHOLESTEROL: 149 mg/dL (ref 0–200)
HDL: 81.8 mg/dL (ref >39.00)
LDL CALC: 59 mg/dL (ref 0–99)
NonHDL: 67.26
TRIGLYCERIDES: 41 mg/dL (ref 0.0–149.0)
Total CHOL/HDL Ratio: 2
VLDL: 8.2 mg/dL (ref 0.0–40.0)

## 2017-08-01 LAB — PSA, MEDICARE: PSA: 2.54 ng/ml (ref 0.10–4.00)

## 2017-08-01 MED ORDER — TETANUS-DIPHTH-ACELL PERTUSSIS 5-2.5-18.5 LF-MCG/0.5 IM SUSP: 0.5000 mL | Freq: Once | INTRAMUSCULAR | 0 refills | Status: AC

## 2017-08-01 NOTE — Patient Instructions (Signed)

## 2017-08-01 NOTE — Patient Instructions (Addendum)
Continue doing brain stimulating activities (puzzles, reading, adult coloring books, staying active) to keep memory sharp.   Schedule colonoscopy in March.   Fall Prevention in the Home Falls can cause injuries. They can happen to people of all ages. There are many things you can do to make your home safe and to help prevent falls. What can I do on the outside of my home?  Regularly fix the edges of walkways and driveways and fix any cracks.  Remove anything that might make you trip as you walk through a door, such as a raised step or threshold.  Trim any bushes or trees on the path to your home.  Use bright outdoor lighting.  Clear any walking paths of anything that might make someone trip, such as rocks or tools.  Regularly check to see if handrails are loose or broken. Make sure that both sides of any steps have handrails.  Any raised decks and porches should have guardrails on the edges.  Have any leaves, snow, or ice cleared regularly.  Use sand or salt on walking paths during winter.  Clean up any spills in your garage right away. This includes oil or grease spills. What can I do in the bathroom?  Use night lights.  Install grab bars by the toilet and in the tub and shower. Do not use towel bars as grab bars.  Use non-skid mats or decals in the tub or shower.  If you need to sit down in the shower, use a plastic, non-slip stool.  Keep the floor dry. Clean up any water that spills on the floor as soon as it happens.  Remove soap buildup in the tub or shower regularly.  Attach bath mats securely with double-sided non-slip rug tape.  Do not have throw rugs and other things on the floor that can make you trip. What can I do in the bedroom?  Use night lights.  Make sure that you have a light by your bed that is easy to reach.  Do not use any sheets or blankets that are too big for your bed. They should not hang down onto the floor.  Have a firm chair that has side  arms. You can use this for support while you get dressed.  Do not have throw rugs and other things on the floor that can make you trip. What can I do in the kitchen?  Clean up any spills right away.  Avoid walking on wet floors.  Keep items that you use a lot in easy-to-reach places.  If you need to reach something above you, use a strong step stool that has a grab bar.  Keep electrical cords out of the way.  Do not use floor polish or wax that makes floors slippery. If you must use wax, use non-skid floor wax.  Do not have throw rugs and other things on the floor that can make you trip. What can I do with my stairs?  Do not leave any items on the stairs.  Make sure that there are handrails on both sides of the stairs and use them. Fix handrails that are broken or loose. Make sure that handrails are as long as the stairways.  Check any carpeting to make sure that it is firmly attached to the stairs. Fix any carpet that is loose or worn.  Avoid having throw rugs at the top or bottom of the stairs. If you do have throw rugs, attach them to the floor with carpet tape.  Make sure that you have a light switch at the top of the stairs and the bottom of the stairs. If you do not have them, ask someone to add them for you. What else can I do to help prevent falls?  Wear shoes that: ? Do not have high heels. ? Have rubber bottoms. ? Are comfortable and fit you well. ? Are closed at the toe. Do not wear sandals.  If you use a stepladder: ? Make sure that it is fully opened. Do not climb a closed stepladder. ? Make sure that both sides of the stepladder are locked into place. ? Ask someone to hold it for you, if possible.  Clearly mark and make sure that you can see: ? Any grab bars or handrails. ? First and last steps. ? Where the edge of each step is.  Use tools that help you move around (mobility aids) if they are needed. These  include: ? Canes. ? Walkers. ? Scooters. ? Crutches.  Turn on the lights when you go into a dark area. Replace any light bulbs as soon as they burn out.  Set up your furniture so you have a clear path. Avoid moving your furniture around.  If any of your floors are uneven, fix them.  If there are any pets around you, be aware of where they are.  Review your medicines with your doctor. Some medicines can make you feel dizzy. This can increase your chance of falling. Ask your doctor what other things that you can do to help prevent falls. This information is not intended to replace advice given to you by your health care provider. Make sure you discuss any questions you have with your health care provider. Document Released: 04/27/2009 Document Revised: 12/07/2015 Document Reviewed: 08/05/2014 Elsevier Interactive Patient Education  2018 [place] Maintenance, Male A healthy lifestyle and preventive care is important for your health and wellness. Ask your health care provider about what schedule of regular examinations is right for you. What should I know about weight and diet? Eat a Healthy Diet  Eat plenty of vegetables, fruits, whole grains, low-fat dairy products, and lean protein.  Do not eat a lot of foods high in solid fats, added sugars, or salt.  Maintain a Healthy Weight Regular exercise can help you achieve or maintain a healthy weight. You should:  Do at least 150 minutes of exercise each week. The exercise should increase your heart rate and make you sweat (moderate-intensity exercise).  Do strength-training exercises at least twice a week.  Watch Your Levels of Cholesterol and Blood Lipids  Have your blood tested for lipids and cholesterol every 5 years starting at 71 years of age. If you are at high risk for heart disease, you should start having your blood tested when you are 71 years old. You may need to have your cholesterol levels checked more  often if: ? Your lipid or cholesterol levels are high. ? You are older than 72 years of age. ? You are at high risk for heart disease.  What should I know about cancer screening? Many types of cancers can be detected early and may often be prevented. Lung Cancer  You should be screened every year for lung cancer if: ? You are a current smoker who has smoked for at least 30 years. ? You are a former smoker who has quit within the past 15 years.  Talk to your health care provider about your screening options, when  you should start screening, and how often you should be screened.  Colorectal Cancer  Routine colorectal cancer screening usually begins at 71 years of age and should be repeated every 5-10 years until you are 71 years old. You may need to be screened more often if early forms of precancerous polyps or small growths are found. Your health care provider may recommend screening at an earlier age if you have risk factors for colon cancer.  Your health care provider may recommend using home test kits to check for hidden blood in the stool.  A small camera at the end of a tube can be used to examine your colon (sigmoidoscopy or colonoscopy). This checks for the earliest forms of colorectal cancer.  Prostate and Testicular Cancer  Depending on your age and overall health, your health care provider may do certain tests to screen for prostate and testicular cancer.  Talk to your health care provider about any symptoms or concerns you have about testicular or prostate cancer.  Skin Cancer  Check your skin from head to toe regularly.  Tell your health care provider about any new moles or changes in moles, especially if: ? There is a change in a mole's size, shape, or color. ? You have a mole that is larger than a pencil eraser.  Always use sunscreen. Apply sunscreen liberally and repeat throughout the day.  Protect yourself by wearing long sleeves, pants, a wide-brimmed hat, and  sunglasses when outside.  What should I know about heart disease, diabetes, and high blood pressure?  If you are 47-31 years of age, have your blood pressure checked every 3-5 years. If you are 73 years of age or older, have your blood pressure checked every year. You should have your blood pressure measured twice-once when you are at a hospital or clinic, and once when you are not at a hospital or clinic. Record the average of the two measurements. To check your blood pressure when you are not at a hospital or clinic, you can use: ? An automated blood pressure machine at a pharmacy. ? A home blood pressure monitor.  Talk to your health care provider about your target blood pressure.  If you are between 70-81 years old, ask your health care provider if you should take aspirin to prevent heart disease.  Have regular diabetes screenings by checking your fasting blood sugar level. ? If you are at a normal weight and have a low risk for diabetes, have this test once every three years after the age of 78. ? If you are overweight and have a high risk for diabetes, consider being tested at a younger age or more often.  A one-time screening for abdominal aortic aneurysm (AAA) by ultrasound is recommended for men aged 68-75 years who are current or former smokers. What should I know about preventing infection? Hepatitis B If you have a higher risk for hepatitis B, you should be screened for this virus. Talk with your health care provider to find out if you are at risk for hepatitis B infection. Hepatitis C Blood testing is recommended for:  Everyone born from [birth year] through 1964.  Anyone with known risk factors for hepatitis C.  Sexually Transmitted Diseases (STDs)  You should be screened each year for STDs including gonorrhea and chlamydia if: ? You are sexually active and are younger than 71 years of age. ? You are older than 71 years of age and your health care provider tells you that you are  at risk for this type of infection. ? Your sexual activity has changed since you were last screened and you are at an increased risk for chlamydia or gonorrhea. Ask your health care provider if you are at risk.  Talk with your health care provider about whether you are at high risk of being infected with HIV. Your health care provider may recommend a prescription medicine to help prevent HIV infection.  What else can I do?  Schedule regular health, dental, and eye exams.  Stay current with your vaccines (immunizations).  Do not use any tobacco products, such as cigarettes, chewing tobacco, and e-cigarettes. If you need help quitting, ask your health care provider.  Limit alcohol intake to no more than 2 drinks per day. One drink equals 12 ounces of beer, 5 ounces of wine, or 1 ounces of hard liquor.  Do not use street drugs.  Do not share needles.  Ask your health care provider for help if you need support or information about quitting drugs.  Tell your health care provider if you often feel depressed.  Tell your health care provider if you have ever been abused or do not feel safe at home. This information is not intended to replace advice given to you by your health care provider. Make sure you discuss any questions you have with your health care provider. Document Released: 12/28/2007 Document Revised: 02/28/2016 Document Reviewed: 04/04/2015 Elsevier Interactive Patient Education  [employer].

## 2017-08-01 NOTE — Progress Notes (Signed)
Office Note 08/01/2017  CC:  Chief Complaint  Patient presents with  . Annual Exam    Pt is fasting.    HPI:  Anthony Daugherty is a 72 y.o. White male who is here for annual health maintenance exam.  He is considering retiring 08/2017.  Exercise: no formal exercise but works in yard, fairly active on his job. Diet: "fairly healthy". Dental: UTD    Past Medical History:  Diagnosis Date  . ABNORMAL ELECTROCARDIOGRAM 07/19/2010  . Aortic stenosis, mild 10/12/10   cath and echo  . Arthritis    L-Spine  . Bronchitis 02/05/2011  . CARDIAC MURMUR 08/13/2010  . Cardiomyopathy, nonischemic ([place]) 10/12/10   Global hypokinesis, EF 40-45% by cath and echo  . Diverticulosis   . GAD (generalized anxiety disorder)    managed by Dr. Toy Care with prozac 20mg  qd  . Gout    Usually Right great toe; colchicine helps well  . Hx of colonoscopy 2004; 2014   Initial was normal; 2014-polypectomy and diverticulosis.  Recall 2019.  Marland Kitchen Hyperlipidemia 10/23/2012  . Hypertension   . Obesity, Class I, BMI 30-34.9   . Single vessel coronary artery disease 10/12/10   80% OM lesion, med mgmt per cards  . TOBACCO ABUSE 08/13/2010    Past Surgical History:  Procedure Laterality Date  . CARDIAC CATHETERIZATION  10/12/10   diagnostic only: 1 vessel CAD with TIMI 3 flow, EF 40-45%.  Mild AS.  Medical mgmt of CAD.  Marland Kitchen COLONOSCOPY W/ POLYPECTOMY  10/06/12   Diverticulosis and tubular adenoma w/out high grade dysplasia.  Repeat 5 yrs (Dr. Deatra Ina).  . TRANSTHORACIC ECHOCARDIOGRAM  09/2010   EF 40-45%, global hypokinesis, mild AS    Family History  Problem Relation Age of Onset  . Alzheimer's disease Mother   . COPD Father   . Heart failure Father        congestive  . Other Father        CHF  . Congenital heart disease Brother        cardiomyopathy  . Other Brother        CHF/ cardiolyopathy  . Colon cancer Neg Hx   . Esophageal cancer Neg Hx   . Stomach cancer Neg Hx   . Rectal cancer Neg Hx      Social History   Socioeconomic History  . Marital status: Married    Spouse name: Thayer Headings  . Number of children: 2  . Years of education: Not on file  . Highest education level: Not on file  Social Needs  . Financial resource strain: Not on file  . Food insecurity - worry: Not on file  . Food insecurity - inability: Not on file  . Transportation needs - medical: Not on file  . Transportation needs - non-medical: Not on file  Occupational History  . Occupation: Engineer, structural: [employer]  Tobacco Use  . Smoking status: Never Smoker  . Smokeless tobacco: Current User    Types: Chew  . Tobacco comment: chews tobacco  Substance and Sexual Activity  . Alcohol use: Yes    Alcohol/week: 4.8 oz    Types: 8 Cans of beer per week  . Drug use: No  . Sexual activity: Yes  Other Topics Concern  . Not on file  Social History Narrative   Delivery driver.  Married, two children.   Long hx of smoking but quit after 2010.    Outpatient Medications Prior to Visit  Medication  Sig Dispense Refill  . aspirin 81 MG tablet Take 81 mg by mouth daily.      Marland Kitchen atorvastatin (LIPITOR) 20 MG tablet TAKE 1 TABLET DAILY 90 tablet 1  . BYSTOLIC 10 MG tablet TAKE 1 TABLET DAILY 90 tablet 1  . Colchicine 0.6 MG CAPS TAKE 2 CAPSULES AT ONSET OF GOUT PAIN, THEN 1 CAPSULE 1 HOUR LATER 30 capsule 0  . FLUoxetine (PROZAC) 20 MG capsule 20 mg daily.    . hydrochlorothiazide (HYDRODIURIL) 25 MG tablet TAKE 1 TABLET DAILY 90 tablet 1  . irbesartan (AVAPRO) 150 MG tablet TAKE ONE TABLET AT BEDTIME 90 tablet 1   No facility-administered medications prior to visit.     Allergies  Allergen Reactions  . Penicillins Swelling    ROS Review of Systems  Constitutional: Negative for appetite change, chills, fatigue and fever.  HENT: Negative for congestion, dental problem, ear pain and sore throat.   Eyes: Negative for discharge, redness and visual disturbance.  Respiratory: Negative for  cough, chest tightness, shortness of breath and wheezing.   Cardiovascular: Negative for chest pain, palpitations and leg swelling.  Gastrointestinal: Negative for abdominal pain, blood in stool, diarrhea, nausea and vomiting.  Genitourinary: Negative for difficulty urinating, dysuria, flank pain, frequency, hematuria and urgency.  Musculoskeletal: Negative for arthralgias, back pain, joint swelling, myalgias and neck stiffness.  Skin: Negative for pallor and rash.  Neurological: Negative for dizziness, speech difficulty, weakness and headaches.  Hematological: Negative for adenopathy. Does not bruise/bleed easily.  Psychiatric/Behavioral: Negative for confusion and sleep disturbance. The patient is not nervous/anxious.     PE; Blood pressure (!) 128/59, pulse 60, temperature 98.2 F (36.8 C), temperature source Oral, resp. rate 16, height 5' 5.5" (1.664 m), weight 172 lb 8 oz (78.2 kg), SpO2 99 %. Gen: Alert, well appearing.  Patient is oriented to person, place, time, and situation. AFFECT: pleasant, lucid thought and speech. ENT: Ears: EACs clear, normal epithelium.  TMs with good light reflex and landmarks bilaterally.  Eyes: no injection, icteris, swelling, or exudate.  EOMI, PERRLA. Nose: no drainage or turbinate edema/swelling.  No injection or focal lesion.  Mouth: lips without lesion/swelling.  Oral mucosa pink and moist.  Dentition intact and without obvious caries or gingival swelling.  Oropharynx without erythema, exudate, or swelling.  Neck: supple/nontender.  No LAD, mass, or TM.  Carotid pulses 2+ bilaterally, without bruits. CV: RRR, 6-7/2 systolic murmur (early) and 1/6 diastolic murmur, S1 and S2 fairly distinct, no r/g.   LUNGS: CTA bilat, nonlabored resps, good aeration in all lung fields. ABD: soft, NT, ND, BS normal.  No hepatospenomegaly or mass.  No bruits. EXT: no clubbing, cyanosis, or edema.  Musculoskeletal: no joint swelling, erythema, warmth, or tenderness.  ROM  of all joints intact. Skin - no sores or suspicious lesions or rashes or color changes Rectal exam: negative without mass, lesions or tenderness, PROSTATE EXAM: smooth and symmetric without nodules or tenderness.  Prostate 3+ size.   Pertinent labs:  Lab Results  Component Value Date   TSH 1.32 11/18/2014   Lab Results  Component Value Date   WBC 4.2 03/12/2017   HGB 14.1 03/12/2017   HCT 41.0 03/12/2017   MCV 94.7 03/12/2017   PLT 214.0 03/12/2017   Lab Results  Component Value Date   CREATININE 1.00 03/12/2017   BUN 14 03/12/2017   NA 139 03/12/2017   K 4.3 03/12/2017   CL 102 03/12/2017   CO2 31 03/12/2017   Lab Results  Component Value Date   ALT 16 03/12/2017   AST 20 03/12/2017   ALKPHOS 61 03/12/2017   BILITOT 1.0 03/12/2017   Lab Results  Component Value Date   CHOL 146 12/13/2016   Lab Results  Component Value Date   HDL 68.40 12/13/2016   Lab Results  Component Value Date   LDLCALC 67 12/13/2016   Lab Results  Component Value Date   TRIG 51.0 12/13/2016   Lab Results  Component Value Date   CHOLHDL 2 12/13/2016   Lab Results  Component Value Date   PSA 1.42 12/08/2015   PSA 1.59 07/22/2014   PSA 1.28 04/23/2013    ASSESSMENT AND PLAN:   1) Systolic and diastolic heart murmur (diastolic murmur new): hx of non-ischemic CM and he has not followed up with cardiology in a long time.  He is asymptomatic.  With new diastolic component to his murmur I do want to recheck an echo--ordered today.  2) Health maintenance exam: Reviewed age and gender appropriate health maintenance issues (prudent diet, regular exercise, health risks of tobacco and excessive alcohol, use of seatbelts, fire alarms in home, use of sunscreen).  Also reviewed age and gender appropriate health screening as well as vaccine recommendations. Vaccines: Flu and shingrix UTD.  Tdap due--rx sent to pharmacy. Labs: HP labs + PSA. Prostate ca screening: DRE normal, PSA. Colon ca  screening: next colonoscopy due 09/2017 ().  An After Visit Summary was printed and given to the patient.  FOLLOW UP:  Return in about 6 months (around 01/29/2018) for routine chronic illness f/u.   Signed:  Crissie Sickles, MD           08/01/2017

## 2017-08-01 NOTE — Progress Notes (Signed)
Subjective:   Anthony Daugherty is a 72 y.o. male who presents for Medicare Annual/Subsequent preventive examination.  Works part time delivering dental cases.   Review of Systems:  No ROS.  Medicare Wellness Visit. Additional risk factors are reflected in the social history.  Cardiac Risk Factors include: advanced age (>48men, >45 women);dyslipidemia;male gender;hypertension;smoking/ tobacco exposure;family history of premature cardiovascular disease   Sleep patterns: Sleeps 6-8 hours. Up to void x 3.  Home Safety/Smoke Alarms: Feels safe in home. Smoke alarms in place.  Living environment; residence and Firearm Safety: Lives with wife in 2 story home.  Seat Belt Safety/Bike Helmet: Wears seat belt.   Male:   CCS-Colonoscopy 10/09/2012, polyps. Recall 5 years.     PSA-  Lab Results  Component Value Date   PSA 1.42 12/08/2015   PSA 1.59 07/22/2014   PSA 1.28 04/23/2013       Objective:    Vitals: BP (!) 128/59   Pulse 60   Temp 98.2 F (36.8 C)   Resp 16   Ht 5\' 5"  (1.651 m)   Wt 172 lb (78 kg)   SpO2 99%   BMI 28.62 kg/m   Body mass index is 28.62 kg/m.  Advanced Directives 08/01/2017  Does Patient Have a Medical Advance Directive? Yes  Type of Paramedic of [place];Living will  Copy of [place] in Chart? Yes    Tobacco Social History   Tobacco Use  Smoking Status Never Smoker  Smokeless Tobacco Current User  . Types: Chew  Tobacco Comment   chews tobacco     Ready to quit: Not Answered Counseling given: Not Answered Comment: chews tobacco    Past Medical History:  Diagnosis Date  . ABNORMAL ELECTROCARDIOGRAM 07/19/2010  . Aortic stenosis, mild 10/12/10   cath and echo  . Arthritis    L-Spine  . Bronchitis 02/05/2011  . CARDIAC MURMUR 08/13/2010  . Cardiomyopathy, nonischemic ([place]) 10/12/10   Global hypokinesis, EF 40-45% by cath and echo  . Diverticulosis   . GAD (generalized anxiety disorder)    managed by Dr. Toy Care with prozac 20mg  qd  . Gout    Usually Right great toe; colchicine helps well  . Hx of colonoscopy 2004; 2014   Initial was normal; 2014-polypectomy and diverticulosis.  Recall 2019.  Marland Kitchen Hyperlipidemia 10/23/2012  . Hypertension   . Obesity, Class I, BMI 30-34.9   . Single vessel coronary artery disease 10/12/10   80% OM lesion, med mgmt per cards  . TOBACCO ABUSE 08/13/2010   Past Surgical History:  Procedure Laterality Date  . CARDIAC CATHETERIZATION  10/12/10   diagnostic only: 1 vessel CAD with TIMI 3 flow, EF 40-45%.  Mild AS.  Medical mgmt of CAD.  Marland Kitchen COLONOSCOPY W/ POLYPECTOMY  10/06/12   Diverticulosis and tubular adenoma w/out high grade dysplasia.  Repeat 5 yrs (Dr. Deatra Ina).  . TRANSTHORACIC ECHOCARDIOGRAM  09/2010   EF 40-45%, global hypokinesis, mild AS   Family History  Problem Relation Age of Onset  . Alzheimer's disease Mother   . COPD Father   . Heart failure Father        congestive  . Other Father        CHF  . Congenital heart disease Brother        cardiomyopathy  . Other Brother        CHF/ cardiolyopathy  . Colon cancer Neg Hx   . Esophageal cancer Neg Hx   . Stomach cancer  Neg Hx   . Rectal cancer Neg Hx    Social History   Socioeconomic History  . Marital status: Married    Spouse name: Thayer Headings  . Number of children: 2  . Years of education: Not on file  . Highest education level: Not on file  Social Needs  . Financial resource strain: Not on file  . Food insecurity - worry: Not on file  . Food insecurity - inability: Not on file  . Transportation needs - medical: Not on file  . Transportation needs - non-medical: Not on file  Occupational History  . Occupation: Engineer, structural: [employer]  Tobacco Use  . Smoking status: Never Smoker  . Smokeless tobacco: Current User    Types: Chew  . Tobacco comment: chews tobacco  Substance and Sexual Activity  . Alcohol use: Yes    Alcohol/week: 4.8 oz    Types: 8  Cans of beer per week  . Drug use: No  . Sexual activity: Yes  Other Topics Concern  . Not on file  Social History Narrative   Delivery driver.  Married, two children.   Long hx of smoking but quit after 2010.    Outpatient Encounter Medications as of 08/01/2017  Medication Sig  . aspirin 81 MG tablet Take 81 mg by mouth daily.    Marland Kitchen atorvastatin (LIPITOR) 20 MG tablet TAKE 1 TABLET DAILY  . BYSTOLIC 10 MG tablet TAKE 1 TABLET DAILY  . Colchicine 0.6 MG CAPS TAKE 2 CAPSULES AT ONSET OF GOUT PAIN, THEN 1 CAPSULE 1 HOUR LATER  . FLUoxetine (PROZAC) 20 MG capsule 20 mg daily.  . hydrochlorothiazide (HYDRODIURIL) 25 MG tablet TAKE 1 TABLET DAILY  . irbesartan (AVAPRO) 150 MG tablet TAKE ONE TABLET AT BEDTIME  . Tdap (BOOSTRIX) 5-2.5-18.5 LF-MCG/0.5 injection Inject 0.5 mLs into the muscle once for 1 dose.   No facility-administered encounter medications on file as of 08/01/2017.     Activities of Daily Living In your present state of health, do you have any difficulty performing the following activities: 08/01/2017  Hearing? N  Vision? N  Difficulty concentrating or making decisions? N  Walking or climbing stairs? N  Dressing or bathing? N  Doing errands, shopping? N  Preparing Food and eating ? N  Using the Toilet? N  In the past six months, have you accidently leaked urine? N  Do you have problems with loss of bowel control? N  Managing your Medications? N  Managing your Finances? N  Housekeeping or managing your Housekeeping? N  Some recent data might be hidden    Patient Care Team: Tammi Sou, MD as PCP - General Fay Records, MD as Consulting Physician (Cardiology) Inda Castle, MD (Inactive) as Consulting Physician (Gastroenterology) Chucky May, MD as Consulting Physician (Psychiatry) Marlyce Huge (Dentistry)   Assessment:   This is a routine wellness examination for [name].  Exercise Activities and Dietary recommendations Current Exercise Habits: The  patient does not participate in regular exercise at present(Maintains yard work), Exercise limited by: None identified   Diet (meal preparation, eat out, water intake, caffeinated beverages, dairy products, fruits and vegetables): Drinks coffee and water.   Breakfast: Coffee, snack Lunch: skips Dinner: meat and vegetables  Discussed heart healthy diet.   Goals    . Increase physical activity     Increase activity by more yard work and walking.        Fall Risk Fall Risk  08/01/2017 08/01/2017  12/08/2015 05/19/2015 12/10/2013  Falls in the past year? No No No No No    Depression Screen PHQ 2/9 Scores 08/01/2017 08/01/2017 12/08/2015 05/19/2015  PHQ - 2 Score 0 0 0 0    Cognitive Function MMSE - Mini Mental State Exam 08/01/2017  Orientation to time 5  Orientation to Place 5  Registration 3  Attention/ Calculation 3  Recall 3  Language- name 2 objects 2  Language- repeat 1  Language- follow 3 step command 3  Language- read & follow direction 1  Write a sentence 1  Copy design 1  Total score 28        Immunization History  Administered Date(s) Administered  . Influenza,inj,Quad PF,6+ Mos 04/23/2013  . Influenza-Unspecified 04/21/2015, 04/11/2017  . Pneumococcal Conjugate-13 07/22/2014  . Pneumococcal Polysaccharide-23 08/16/2011, 12/13/2016  . Td 07/16/2007  . Zoster Recombinat (Shingrix) 02/25/2017, 04/11/2017    Screening Tests Health Maintenance  Topic Date Due  . TETANUS/TDAP  07/15/2017  . COLONOSCOPY  10/09/2017  . INFLUENZA VACCINE  Completed  . Hepatitis C Screening  Completed  . PNA vac Low Risk Adult  Completed       Plan:    Continue doing brain stimulating activities (puzzles, reading, adult coloring books, staying active) to keep memory sharp.   Schedule colonoscopy in March.   I have personally reviewed and noted the following in the patient's chart:   . Medical and social history . Use of alcohol, tobacco or illicit drugs  . Current  medications and supplements . Functional ability and status . Nutritional status . Physical activity . Advanced directives . List of other physicians . Hospitalizations, surgeries, and ER visits in previous 12 months . Vitals . Screenings to include cognitive, depression, and falls . Referrals and appointments  In addition, I have reviewed and discussed with patient certain preventive protocols, quality metrics, and best practice recommendations. A written personalized care plan for preventive services as well as general preventive health recommendations were provided to patient.     Gerilyn Nestle, RN  08/01/2017

## 2017-08-01 NOTE — Progress Notes (Signed)
AWV reviewed and agree.  Signed:  Phil [name], MD           08/01/2017  

## 2017-08-04 ENCOUNTER — Encounter (HOSPITAL_COMMUNITY): Payer: Self-pay | Admitting: Radiology

## 2017-08-15 ENCOUNTER — Ambulatory Visit (HOSPITAL_COMMUNITY): Payer: Medicare Other | Attending: Cardiology

## 2017-08-15 ENCOUNTER — Encounter: Payer: Self-pay | Admitting: Family Medicine

## 2017-08-15 ENCOUNTER — Other Ambulatory Visit: Payer: Self-pay

## 2017-08-15 DIAGNOSIS — I350 Nonrheumatic aortic (valve) stenosis: Secondary | ICD-10-CM | POA: Insufficient documentation

## 2017-08-15 DIAGNOSIS — Z720 Tobacco use: Secondary | ICD-10-CM | POA: Insufficient documentation

## 2017-08-15 DIAGNOSIS — I428 Other cardiomyopathies: Secondary | ICD-10-CM

## 2017-08-15 DIAGNOSIS — I429 Cardiomyopathy, unspecified: Secondary | ICD-10-CM | POA: Diagnosis not present

## 2017-08-15 DIAGNOSIS — I351 Nonrheumatic aortic (valve) insufficiency: Secondary | ICD-10-CM | POA: Insufficient documentation

## 2017-08-15 DIAGNOSIS — R011 Cardiac murmur, unspecified: Secondary | ICD-10-CM | POA: Insufficient documentation

## 2017-08-15 DIAGNOSIS — I2510 Atherosclerotic heart disease of native coronary artery without angina pectoris: Secondary | ICD-10-CM | POA: Insufficient documentation

## 2017-08-15 DIAGNOSIS — E785 Hyperlipidemia, unspecified: Secondary | ICD-10-CM | POA: Insufficient documentation

## 2017-08-15 DIAGNOSIS — I38 Endocarditis, valve unspecified: Secondary | ICD-10-CM | POA: Insufficient documentation

## 2017-08-15 DIAGNOSIS — I10 Essential (primary) hypertension: Secondary | ICD-10-CM | POA: Insufficient documentation

## 2017-08-15 DIAGNOSIS — I482 Chronic atrial fibrillation: Secondary | ICD-10-CM | POA: Diagnosis present

## 2017-09-28 ENCOUNTER — Encounter: Payer: Self-pay | Admitting: Gastroenterology

## 2017-10-27 ENCOUNTER — Other Ambulatory Visit: Payer: Self-pay | Admitting: Family Medicine

## 2018-01-30 ENCOUNTER — Ambulatory Visit (INDEPENDENT_AMBULATORY_CARE_PROVIDER_SITE_OTHER): Payer: Medicare Other | Admitting: Family Medicine

## 2018-01-30 ENCOUNTER — Encounter: Payer: Self-pay | Admitting: Family Medicine

## 2018-01-30 VITALS — BP 132/67 | HR 65 | Temp 98.4°F | Resp 16 | Ht 65.5 in | Wt 167.5 lb

## 2018-01-30 DIAGNOSIS — I10 Essential (primary) hypertension: Secondary | ICD-10-CM | POA: Diagnosis not present

## 2018-01-30 DIAGNOSIS — I428 Other cardiomyopathies: Secondary | ICD-10-CM | POA: Diagnosis not present

## 2018-01-30 DIAGNOSIS — E7800 Pure hypercholesterolemia, unspecified: Secondary | ICD-10-CM

## 2018-01-30 DIAGNOSIS — I351 Nonrheumatic aortic (valve) insufficiency: Secondary | ICD-10-CM | POA: Diagnosis not present

## 2018-01-30 DIAGNOSIS — E663 Overweight: Secondary | ICD-10-CM

## 2018-01-30 LAB — LIPID PANEL
Cholesterol: 167 mg/dL (ref 0–200)
HDL: 81.8 mg/dL (ref >39.00)
LDL Cholesterol: 76 mg/dL (ref 0–99)
NonHDL: 84.99
Total CHOL/HDL Ratio: 2
Triglycerides: 44 mg/dL (ref 0.0–149.0)
VLDL: 8.8 mg/dL (ref 0.0–40.0)

## 2018-01-30 LAB — BASIC METABOLIC PANEL
BUN: 10 mg/dL (ref 6–23)
CO2: 29 mEq/L (ref 19–32)
Calcium: 9.2 mg/dL (ref 8.4–10.5)
Chloride: 103 mEq/L (ref 96–112)
Creatinine, Ser: 0.97 mg/dL (ref 0.40–1.50)
GFR: 80.99 mL/min (ref >60.00)
Glucose, Bld: 87 mg/dL (ref 70–99)
Potassium: 4.2 mEq/L (ref 3.5–5.1)
Sodium: 140 mEq/L (ref 135–145)

## 2018-01-30 NOTE — Patient Instructions (Signed)
[facility] office to set up a repeat colonoscopy--[phone].

## 2018-01-30 NOTE — Progress Notes (Signed)
OFFICE VISIT  01/30/2018   CC:  Chief Complaint  Patient presents with  . Follow-up    RCI, pt is fasting.    HPI:    Patient is a 72 y.o. Caucasian male who presents for 6 mo f/u HTN, HLD, nonischemic CM + mild/mod aortic regurg followed by cardiology.  HTN: consistently <130/80 at home.  He doesn't pay attention to the pulse, although he thinks high 50s.  HLD: taking atorva 20mg  qd. Eating more vegetables lately.  Still eating high fat meats.  Cardiac: he denies feeling any limitation in activity from this.  No CP.  ROS: no CP, no SOB, no wheezing, no cough, no dizziness, no HAs, no rashes, no melena/hematochezia.  No polyuria or polydipsia.  No myalgias or arthralgias.   Past Medical History:  Diagnosis Date  . ABNORMAL ELECTROCARDIOGRAM 07/19/2010  . Aortic regurgitation    Mild-mod on echo 07/2017.  Plan repeat echo 07/2018.  Marland Kitchen Aortic stenosis, mild 10/12/10   cath and echo  . Arthritis    L-Spine  . Bronchitis 02/05/2011  . CARDIAC MURMUR 90/24/0973; 5329   Systolic and diastolic as of 03/2425.  . Cardiomyopathy, nonischemic ([place]) 10/12/2010   Global hypokinesis, EF 40-45% by cath and echo (2012 echo and 2019 echo).  . Diverticulosis   . GAD (generalized anxiety disorder)    managed by Dr. Toy Care with prozac 20mg  qd  . Gout    Usually Right great toe; colchicine helps well  . Hx of colonoscopy 2004; 2014   Initial was normal; 2014-polypectomy and diverticulosis.  Recall 2019.  Marland Kitchen Hyperlipidemia 10/23/2012  . Hypertension   . Obesity, Class I, BMI 30-34.9   . Single vessel coronary artery disease 10/12/10   80% OM lesion, med mgmt per cards  . TOBACCO ABUSE 08/13/2010    Past Surgical History:  Procedure Laterality Date  . CARDIAC CATHETERIZATION  10/12/10   diagnostic only: 1 vessel CAD with TIMI 3 flow, EF 40-45%.  Mild AS.  Medical mgmt of CAD.  Marland Kitchen COLONOSCOPY W/ POLYPECTOMY  10/06/12   Diverticulosis and tubular adenoma w/out high grade dysplasia.  Repeat 5 yrs (Dr.  Deatra Ina).  . TRANSTHORACIC ECHOCARDIOGRAM  09/2010   2012:  EF 40-45%, global hypokinesis, mild AS.  07/2017: no interval change except mild/mod aortic regurg.  Plan repeat 07/2018.    Outpatient Medications Prior to Visit  Medication Sig Dispense Refill  . aspirin 81 MG tablet Take 81 mg by mouth daily.      Marland Kitchen atorvastatin (LIPITOR) 20 MG tablet TAKE 1 TABLET DAILY 90 tablet 1  . BYSTOLIC 10 MG tablet TAKE 1 TABLET DAILY 90 tablet 1  . Colchicine 0.6 MG CAPS TAKE 2 CAPSULES AT ONSET OF GOUT PAIN, THEN 1 CAPSULE 1 HOUR LATER 30 capsule 0  . FLUoxetine (PROZAC) 20 MG capsule 20 mg daily.    . hydrochlorothiazide (HYDRODIURIL) 25 MG tablet TAKE 1 TABLET DAILY 90 tablet 1  . irbesartan (AVAPRO) 150 MG tablet TAKE ONE TABLET AT BEDTIME 90 tablet 1   No facility-administered medications prior to visit.     Allergies  Allergen Reactions  . Penicillins Swelling    ROS As per HPI  PE: Blood pressure 132/67, pulse 65, temperature 98.4 F (36.9 C), temperature source Oral, resp. rate 16, height 5' 5.5" (1.664 m), weight 167 lb 8 oz (76 kg), SpO2 96 %. Body mass index is 27.45 kg/m.  Gen: Alert, well appearing.  Patient is oriented to person, place, time, and situation. AFFECT:  pleasant, lucid thought and speech. CV: RRR, 6-1/4 systolic murmur, + soft diastolic murmur.  No rub or gallop. Chest is clear, no wheezing or rales. Normal symmetric air entry throughout both lung fields. No chest wall deformities or tenderness. EXT: no clubbing, cyanosis, or edema.    LABS:  Lab Results  Component Value Date   TSH 1.32 11/18/2014   Lab Results  Component Value Date   WBC 4.2 08/01/2017   HGB 14.2 08/01/2017   HCT 41.4 08/01/2017   MCV 93.9 08/01/2017   PLT 243.0 08/01/2017   Lab Results  Component Value Date   CREATININE 0.97 01/30/2018   BUN 10 01/30/2018   NA 140 01/30/2018   K 4.2 01/30/2018   CL 103 01/30/2018   CO2 29 01/30/2018   Lab Results  Component Value Date   ALT  17 08/01/2017   AST 21 08/01/2017   ALKPHOS 70 08/01/2017   BILITOT 1.1 08/01/2017   Lab Results  Component Value Date   CHOL 167 01/30/2018   Lab Results  Component Value Date   HDL 81.80 01/30/2018   Lab Results  Component Value Date   LDLCALC 76 01/30/2018   Lab Results  Component Value Date   TRIG 44.0 01/30/2018   Lab Results  Component Value Date   CHOLHDL 2 01/30/2018   Lab Results  Component Value Date   PSA 2.54 08/01/2017   PSA 1.42 12/08/2015   PSA 1.59 07/22/2014    IMPRESSION AND PLAN:  1) HTN: The current medical regimen is effective;  continue present plan and medications. BMET today.  2) HLD: tolerating statin. FLP today.  3) Nonischemic CM + mild/mod aortic stenosis and aortic insufficiency. Asymptomatic.  NO sign of fluid overload. Per cardiology, next echo planned for 07/2018.  An After Visit Summary was printed and given to the patient.  FOLLOW UP: Return in about 6 months (around 08/02/2018) for annual CPE (fasting).  Signed:  Crissie Sickles, MD           01/30/2018

## 2018-03-04 ENCOUNTER — Other Ambulatory Visit: Payer: Self-pay | Admitting: Family Medicine

## 2018-03-04 NOTE — Telephone Encounter (Signed)
RF request for colchicine LOV: 01/30/18 Next ov: 08/03/18 Last written: 11/20/16 #30 w/ 0RF  Please advise. Thanks.

## 2018-08-03 ENCOUNTER — Encounter: Payer: Self-pay | Admitting: Family Medicine

## 2018-08-03 ENCOUNTER — Ambulatory Visit (INDEPENDENT_AMBULATORY_CARE_PROVIDER_SITE_OTHER): Payer: Medicare Other | Admitting: Family Medicine

## 2018-08-03 VITALS — BP 116/58 | HR 60 | Temp 97.7°F | Resp 16 | Ht 65.5 in | Wt 170.5 lb

## 2018-08-03 DIAGNOSIS — I350 Nonrheumatic aortic (valve) stenosis: Secondary | ICD-10-CM

## 2018-08-03 DIAGNOSIS — Z0000 Encounter for general adult medical examination without abnormal findings: Secondary | ICD-10-CM

## 2018-08-03 DIAGNOSIS — E7800 Pure hypercholesterolemia, unspecified: Secondary | ICD-10-CM

## 2018-08-03 DIAGNOSIS — I10 Essential (primary) hypertension: Secondary | ICD-10-CM

## 2018-08-03 DIAGNOSIS — E663 Overweight: Secondary | ICD-10-CM

## 2018-08-03 DIAGNOSIS — I351 Nonrheumatic aortic (valve) insufficiency: Secondary | ICD-10-CM

## 2018-08-03 LAB — CBC WITH DIFFERENTIAL/PLATELET
Basophils Absolute: 0 10*3/uL (ref 0.0–0.1)
Basophils Relative: 0.8 % (ref 0.0–3.0)
EOS ABS: 0.2 10*3/uL (ref 0.0–0.7)
Eosinophils Relative: 4.1 % (ref 0.0–5.0)
HCT: 40.4 % (ref 39.0–52.0)
Hemoglobin: 14.2 g/dL (ref 13.0–17.0)
LYMPHS ABS: 1.2 10*3/uL (ref 0.7–4.0)
Lymphocytes Relative: 26.7 % (ref 12.0–46.0)
MCHC: 35.1 g/dL (ref 30.0–36.0)
MCV: 92.1 fl (ref 78.0–100.0)
MONO ABS: 0.4 10*3/uL (ref 0.1–1.0)
MONOS PCT: 8.6 % (ref 3.0–12.0)
NEUTROS ABS: 2.7 10*3/uL (ref 1.4–7.7)
Neutrophils Relative %: 59.8 % (ref 43.0–77.0)
PLATELETS: 225 10*3/uL (ref 150.0–400.0)
RBC: 4.39 Mil/uL (ref 4.22–5.81)
RDW: 13.1 % (ref 11.5–15.5)
WBC: 4.5 10*3/uL (ref 4.0–10.5)

## 2018-08-03 LAB — COMPREHENSIVE METABOLIC PANEL
ALK PHOS: 65 U/L (ref 39–117)
ALT: 13 U/L (ref 0–53)
AST: 14 U/L (ref 0–37)
Albumin: 4.6 g/dL (ref 3.5–5.2)
BUN: 16 mg/dL (ref 6–23)
CHLORIDE: 101 meq/L (ref 96–112)
CO2: 29 meq/L (ref 19–32)
Calcium: 9.5 mg/dL (ref 8.4–10.5)
Creatinine, Ser: 1.01 mg/dL (ref 0.40–1.50)
GFR: 72.63 mL/min (ref >60.00)
GLUCOSE: 93 mg/dL (ref 70–99)
POTASSIUM: 3.5 meq/L (ref 3.5–5.1)
Sodium: 139 mEq/L (ref 135–145)
TOTAL PROTEIN: 6.7 g/dL (ref 6.0–8.3)
Total Bilirubin: 1.1 mg/dL (ref 0.2–1.2)

## 2018-08-03 LAB — LIPID PANEL
Cholesterol: 143 mg/dL (ref 0–200)
HDL: 48.1 mg/dL (ref >39.00)
LDL Cholesterol: 81 mg/dL (ref 0–99)
NONHDL: 94.41
Total CHOL/HDL Ratio: 3
Triglycerides: 68 mg/dL (ref 0.0–149.0)
VLDL: 13.6 mg/dL (ref 0.0–40.0)

## 2018-08-03 NOTE — Patient Instructions (Addendum)
Call [facility] to arrange a colonoscopy: [phone]   Health Maintenance, Male A healthy lifestyle and preventive care is important for your health and wellness. Ask your health care provider about what schedule of regular examinations is right for you. What should I know about weight and diet? Eat a Healthy Diet  Eat plenty of vegetables, fruits, whole grains, low-fat dairy products, and lean protein.  Do not eat a lot of foods high in solid fats, added sugars, or salt.  Maintain a Healthy Weight Regular exercise can help you achieve or maintain a healthy weight. You should:  Do at least 150 minutes of exercise each week. The exercise should increase your heart rate and make you sweat (moderate-intensity exercise).  Do strength-training exercises at least twice a week. Watch Your Levels of Cholesterol and Blood Lipids  Have your blood tested for lipids and cholesterol every 5 years starting at 72 years of age. If you are at high risk for heart disease, you should start having your blood tested when you are 72 years old. You may need to have your cholesterol levels checked more often if: ? Your lipid or cholesterol levels are high. ? You are older than 72 years of age. ? You are at high risk for heart disease. What should I know about cancer screening? Many types of cancers can be detected early and may often be prevented. Lung Cancer  You should be screened every year for lung cancer if: ? You are a current smoker who has smoked for at least 30 years. ? You are a former smoker who has quit within the past 15 years.  Talk to your health care provider about your screening options, when you should start screening, and how often you should be screened. Colorectal Cancer  Routine colorectal cancer screening usually begins at 72 years of age and should be repeated every 5-10 years until you are 72 years old. You may need to be screened more often if early forms of precancerous polyps  or small growths are found. Your health care provider may recommend screening at an earlier age if you have risk factors for colon cancer.  Your health care provider may recommend using home test kits to check for hidden blood in the stool.  A small camera at the end of a tube can be used to examine your colon (sigmoidoscopy or colonoscopy). This checks for the earliest forms of colorectal cancer. Prostate and Testicular Cancer  Depending on your age and overall health, your health care provider may do certain tests to screen for prostate and testicular cancer.  Talk to your health care provider about any symptoms or concerns you have about testicular or prostate cancer. Skin Cancer  Check your skin from head to toe regularly.  Tell your health care provider about any new moles or changes in moles, especially if: ? There is a change in a mole's size, shape, or color. ? You have a mole that is larger than a pencil eraser.  Always use sunscreen. Apply sunscreen liberally and repeat throughout the day.  Protect yourself by wearing long sleeves, pants, a wide-brimmed hat, and sunglasses when outside. What should I know about heart disease, diabetes, and high blood pressure?  If you are 67-80 years of age, have your blood pressure checked every 3-5 years. If you are 44 years of age or older, have your blood pressure checked every year. You should have your blood pressure measured twice-once when you are at a hospital or  clinic, and once when you are not at a hospital or clinic. Record the average of the two measurements. To check your blood pressure when you are not at a hospital or clinic, you can use: ? An automated blood pressure machine at a pharmacy. ? A home blood pressure monitor.  Talk to your health care provider about your target blood pressure.  If you are between 59-52 years old, ask your health care provider if you should take aspirin to prevent heart disease.  Have regular  diabetes screenings by checking your fasting blood sugar level. ? If you are at a normal weight and have a low risk for diabetes, have this test once every three years after the age of 91. ? If you are overweight and have a high risk for diabetes, consider being tested at a younger age or more often.  A one-time screening for abdominal aortic aneurysm (AAA) by ultrasound is recommended for men aged 54-75 years who are current or former smokers. What should I know about preventing infection? Hepatitis B If you have a higher risk for hepatitis B, you should be screened for this virus. Talk with your health care provider to find out if you are at risk for hepatitis B infection. Hepatitis C Blood testing is recommended for:  Everyone born from [birth year] through 1964.  Anyone with known risk factors for hepatitis C. Sexually Transmitted Diseases (STDs)  You should be screened each year for STDs including gonorrhea and chlamydia if: ? You are sexually active and are younger than 72 years of age. ? You are older than 72 years of age and your health care provider tells you that you are at risk for this type of infection. ? Your sexual activity has changed since you were last screened and you are at an increased risk for chlamydia or gonorrhea. Ask your health care provider if you are at risk.  Talk with your health care provider about whether you are at high risk of being infected with HIV. Your health care provider may recommend a prescription medicine to help prevent HIV infection. What else can I do?  Schedule regular health, dental, and eye exams.  Stay current with your vaccines (immunizations).  Do not use any tobacco products, such as cigarettes, chewing tobacco, and e-cigarettes. If you need help quitting, ask your health care provider.  Limit alcohol intake to no more than 2 drinks per day. One drink equals 12 ounces of beer, 5 ounces of wine, or 1 ounces of hard liquor.  Do not use  street drugs.  Do not share needles.  Ask your health care provider for help if you need support or information about quitting drugs.  Tell your health care provider if you often feel depressed.  Tell your health care provider if you have ever been abused or do not feel safe at home. This information is not intended to replace advice given to you by your health care provider. Make sure you discuss any questions you have with your health care provider. Document Released: 12/28/2007 Document Revised: 02/28/2016 Document Reviewed: 04/04/2015 Elsevier Interactive Patient Education  2019 [employer].

## 2018-08-03 NOTE — Progress Notes (Signed)
Office Note 08/03/2018  CC:  Chief Complaint  Patient presents with  . Annual Exam    Pt is fasting.     HPI:  Anthony Daugherty is a 73 y.o. White male who is here for annual health maintenance exam.  Exercise: does some walking. Diet: not working anything, says it is healthy diet already. Eye exam overdue.  No acute complaints. Past Medical History:  Diagnosis Date  . ABNORMAL ELECTROCARDIOGRAM 07/19/2010  . Aortic regurgitation    Mild-mod on echo 07/2017.  Plan repeat echo 07/2018.  Marland Kitchen Aortic stenosis, mild 10/12/10   cath and echo  . Arthritis    L-Spine  . Bronchitis 02/05/2011  . CARDIAC MURMUR 47/03/6282; 6629   Systolic and diastolic as of 10/7652.  . Cardiomyopathy, nonischemic ([place]) 10/12/2010   Global hypokinesis, EF 40-45% by cath and echo (2012 echo and 2019 echo).  . Diverticulosis   . GAD (generalized anxiety disorder)    managed by Dr. Toy Care with prozac 20mg  qd  . Gout    Usually Right great toe; colchicine helps well  . Hx of colonoscopy 2004; 2014   Initial was normal; 2014-polypectomy and diverticulosis.  Recall 2019.  Marland Kitchen Hyperlipidemia 10/23/2012  . Hypertension   . Obesity, Class I, BMI 30-34.9   . Single vessel coronary artery disease 10/12/10   80% OM lesion, med mgmt per cards  . TOBACCO ABUSE 08/13/2010    Past Surgical History:  Procedure Laterality Date  . CARDIAC CATHETERIZATION  10/12/10   diagnostic only: 1 vessel CAD with TIMI 3 flow, EF 40-45%.  Mild AS.  Medical mgmt of CAD.  Marland Kitchen COLONOSCOPY W/ POLYPECTOMY  10/06/12   Diverticulosis and tubular adenoma w/out high grade dysplasia.  Repeat 5 yrs (Dr. Deatra Ina).  . TRANSTHORACIC ECHOCARDIOGRAM  09/2010; 07/2017   2012:  EF 40-45%, global hypokinesis, mild AS.  07/2017: no interval change except mild/mod aortic regurg.  Plan repeat 07/2018.    Family History  Problem Relation Age of Onset  . Alzheimer's disease Mother   . COPD Father   . Heart failure Father        congestive  . Other  Father        CHF  . Congenital heart disease Brother        cardiomyopathy  . Other Brother        CHF/ cardiolyopathy  . Colon cancer Neg Hx   . Esophageal cancer Neg Hx   . Stomach cancer Neg Hx   . Rectal cancer Neg Hx     Social History   Socioeconomic History  . Marital status: Married    Spouse name: Thayer Headings  . Number of children: 2  . Years of education: Not on file  . Highest education level: Not on file  Occupational History  . Occupation: Engineer, structural: [employer]  Social Needs  . Financial resource strain: Not on file  . Food insecurity:    Worry: Not on file    Inability: Not on file  . Transportation needs:    Medical: Not on file    Non-medical: Not on file  Tobacco Use  . Smoking status: Never Smoker  . Smokeless tobacco: Current User    Types: Chew  . Tobacco comment: chews tobacco  Substance and Sexual Activity  . Alcohol use: Yes    Alcohol/week: 8.0 standard drinks    Types: 8 Cans of beer per week  . Drug use: No  . Sexual activity:  Yes  Lifestyle  . Physical activity:    Days per week: Not on file    Minutes per session: Not on file  . Stress: Not on file  Relationships  . Social connections:    Talks on phone: Not on file    Gets together: Not on file    Attends religious service: Not on file    Active member of club or organization: Not on file    Attends meetings of clubs or organizations: Not on file    Relationship status: Not on file  . Intimate partner violence:    Fear of current or ex partner: Not on file    Emotionally abused: Not on file    Physically abused: Not on file    Forced sexual activity: Not on file  Other Topics Concern  . Not on file  Social History Narrative   Delivery driver.  Married, two children.   Long hx of smoking but quit after 2010.    Outpatient Medications Prior to Visit  Medication Sig Dispense Refill  . aspirin 81 MG tablet Take 81 mg by mouth daily.      Marland Kitchen atorvastatin  (LIPITOR) 20 MG tablet TAKE 1 TABLET DAILY 90 tablet 1  . BYSTOLIC 10 MG tablet TAKE 1 TABLET DAILY 90 tablet 1  . colchicine 0.6 MG tablet TAKE 2 TABLETS AT ONSET OF GOUT PAIN, REPEAT IN 1 HOUR, THEN 1 TABLETTWICE/DAY TIL RESOLVED 30 tablet 5  . FLUoxetine (PROZAC) 20 MG capsule 20 mg daily.    . hydrochlorothiazide (HYDRODIURIL) 25 MG tablet TAKE 1 TABLET DAILY 90 tablet 1  . irbesartan (AVAPRO) 150 MG tablet TAKE ONE TABLET AT BEDTIME 90 tablet 1  . Colchicine 0.6 MG CAPS TAKE 2 CAPSULES AT ONSET OF GOUT PAIN, THEN 1 CAPSULE 1 HOUR LATER (Patient not taking: Reported on 08/03/2018) 30 capsule 0   No facility-administered medications prior to visit.     Allergies  Allergen Reactions  . Penicillins Swelling    ROS Review of Systems  Constitutional: Negative for appetite change, chills, fatigue and fever.  HENT: Negative for congestion, dental problem, ear pain and sore throat.   Eyes: Negative for discharge, redness and visual disturbance.  Respiratory: Negative for cough, chest tightness, shortness of breath and wheezing.   Cardiovascular: Negative for chest pain, palpitations and leg swelling.  Gastrointestinal: Negative for abdominal pain, blood in stool, diarrhea, nausea and vomiting.  Genitourinary: Negative for difficulty urinating, dysuria, flank pain, frequency, hematuria and urgency.  Musculoskeletal: Negative for arthralgias, back pain, joint swelling, myalgias and neck stiffness.  Skin: Negative for pallor and rash.  Neurological: Negative for dizziness, speech difficulty, weakness and headaches.  Hematological: Negative for adenopathy. Does not bruise/bleed easily.  Psychiatric/Behavioral: Negative for confusion and sleep disturbance. The patient is not nervous/anxious.     PE; Blood pressure (!) 116/58, pulse 60, temperature 97.7 F (36.5 C), temperature source Oral, resp. rate 16, height 5' 5.5" (1.664 m), weight 170 lb 8 oz (77.3 kg), SpO2 98 %. Gen: Alert, well  appearing.  Patient is oriented to person, place, time, and situation. AFFECT: pleasant, lucid thought and speech. ENT: Ears: EACs clear, normal epithelium.  TMs with good light reflex and landmarks bilaterally.  Eyes: no injection, icteris, swelling, or exudate.  EOMI, PERRLA. Nose: no drainage or turbinate edema/swelling.  No injection or focal lesion.  Mouth: lips without lesion/swelling.  Oral mucosa pink and moist.  Dentition intact and without obvious caries or gingival swelling.  Oropharynx  without erythema, exudate, or swelling.  Neck: supple/nontender.  No LAD, mass, or TM.  Carotid pulses 2+ bilaterally, without bruits. CV: RRR, no m/r/g.   LUNGS: CTA bilat, nonlabored resps, good aeration in all lung fields. ABD: soft, NT, ND, BS normal.  No hepatospenomegaly or mass.  No bruits. EXT: no clubbing, cyanosis, or edema.  Musculoskeletal: no joint swelling, erythema, warmth, or tenderness.  ROM of all joints intact. Skin - no sores or suspicious lesions or rashes or color changes Rectal exam: negative without mass, lesions or tenderness, PROSTATE EXAM: smooth and symmetric without nodules or tenderness.   Pertinent labs:  Lab Results  Component Value Date   TSH 1.32 11/18/2014   Lab Results  Component Value Date   WBC 4.2 08/01/2017   HGB 14.2 08/01/2017   HCT 41.4 08/01/2017   MCV 93.9 08/01/2017   PLT 243.0 08/01/2017   Lab Results  Component Value Date   CREATININE 0.97 01/30/2018   BUN 10 01/30/2018   NA 140 01/30/2018   K 4.2 01/30/2018   CL 103 01/30/2018   CO2 29 01/30/2018   Lab Results  Component Value Date   ALT 17 08/01/2017   AST 21 08/01/2017   ALKPHOS 70 08/01/2017   BILITOT 1.1 08/01/2017   Lab Results  Component Value Date   CHOL 167 01/30/2018   Lab Results  Component Value Date   HDL 81.80 01/30/2018   Lab Results  Component Value Date   LDLCALC 76 01/30/2018   Lab Results  Component Value Date   TRIG 44.0 01/30/2018   Lab Results   Component Value Date   CHOLHDL 2 01/30/2018   Lab Results  Component Value Date   PSA 2.54 08/01/2017   PSA 1.42 12/08/2015   PSA 1.59 07/22/2014   No results found for: HGBA1C   ASSESSMENT AND PLAN:   Health maintenance exam: Reviewed age and gender appropriate health maintenance issues (prudent diet, regular exercise, health risks of tobacco and excessive alcohol, use of seatbelts, fire alarms in home, use of sunscreen).  Also reviewed age and gender appropriate health screening as well as vaccine recommendations. Vaccines: all UTD, including shingrix. Labs: FLP, CMET, CBC. Prostate ca screening: DRE normal , PSA.  He is in favor of continuing prostate cancer screening at least for the next few years. Colon ca screening: repeat colonoscopy was due in 2019--> he'll call [facility] to schedule.  Heart murmur: hx of NICM, mild aortic root enlargement, mild AS, mild/mod AR. Last echo was 07/2017.  Will repeat echo now.  An After Visit Summary was printed and given to the patient.  FOLLOW UP:  Return in about 6 months (around 02/01/2019) for routine chronic illness f/u.  Signed:  Crissie Sickles, MD           08/03/2018

## 2018-09-07 ENCOUNTER — Encounter: Payer: Self-pay | Admitting: Family Medicine

## 2018-09-07 ENCOUNTER — Ambulatory Visit (HOSPITAL_COMMUNITY): Payer: Medicare Other | Attending: Cardiology

## 2018-09-07 DIAGNOSIS — I350 Nonrheumatic aortic (valve) stenosis: Secondary | ICD-10-CM

## 2018-09-07 DIAGNOSIS — I351 Nonrheumatic aortic (valve) insufficiency: Secondary | ICD-10-CM | POA: Diagnosis not present

## 2018-09-08 ENCOUNTER — Other Ambulatory Visit: Payer: Self-pay | Admitting: Family Medicine

## 2018-09-08 DIAGNOSIS — I351 Nonrheumatic aortic (valve) insufficiency: Secondary | ICD-10-CM

## 2018-09-08 DIAGNOSIS — I428 Other cardiomyopathies: Secondary | ICD-10-CM

## 2018-09-14 ENCOUNTER — Other Ambulatory Visit: Payer: Self-pay | Admitting: Family Medicine

## 2018-09-16 ENCOUNTER — Encounter: Payer: Self-pay | Admitting: Cardiology

## 2018-09-30 ENCOUNTER — Telehealth: Payer: Self-pay | Admitting: Cardiology

## 2018-09-30 NOTE — Telephone Encounter (Signed)
New Message   Pt and his wife is calling to talk to Suezanne Jacquet They would rather not come to the appointment tomorrow if it is not necessary.  Please call back and advise

## 2018-09-30 NOTE — Telephone Encounter (Signed)
Spoke with the patient, he stated he would rather wait a month to be seen to reduce their exposure. Advised to call our office, if he develops symptoms.

## 2018-10-01 ENCOUNTER — Ambulatory Visit: Payer: Medicare Other | Admitting: Cardiology

## 2018-10-13 ENCOUNTER — Encounter: Payer: Self-pay | Admitting: Family Medicine

## 2018-10-14 DIAGNOSIS — I77810 Thoracic aortic ectasia: Secondary | ICD-10-CM

## 2018-10-14 HISTORY — DX: Thoracic aortic ectasia: I77.810

## 2018-10-16 ENCOUNTER — Telehealth: Payer: Self-pay | Admitting: Cardiology

## 2018-10-16 ENCOUNTER — Encounter: Payer: Self-pay | Admitting: Family Medicine

## 2018-10-16 NOTE — Telephone Encounter (Signed)
Patient would like to speak to Dr. Theodosia Blender nurse Suezanne Jacquet.

## 2018-10-16 NOTE — Telephone Encounter (Signed)
TELEPHONE CALL NOTE  This patient has been deemed a candidate for follow-up tele-health visit to limit community exposure during the Covid-19 pandemic. I spoke with the patient via phone to discuss instructions. This has been outlined on the patient's AVS (dotphrase: [email or web address]). The patient was advised to review the section on consent for treatment as well. The patient will receive a phone call 2-3 days prior to their E-Visit at which time consent will be verbally confirmed. A Virtual Office Visit appointment type has been scheduled for 8:40 am on 10/22/18 with Dr. Radford Pax  I have either confirmed the patient is active in [facility] or offered to send sign-up link to phone/email via [facility] beside patient's photo. YES   Sarina Ill, RN 10/16/2018 2:03 PM  TELEPHONE CALL NOTE  Anthony Daugherty has been deemed a candidate for a follow-up tele-health visit to limit community exposure during the Covid-19 pandemic. I spoke with the patient via phone to ensure availability of phone/video source, confirm preferred email & phone number, and discuss instructions and expectations.  I reminded Anthony Daugherty to be prepared with any vital sign and/or heart rhythm information that could potentially be obtained via home monitoring, at the time of his visit. I reminded Anthony Daugherty to expect a phone call at the time of his visit if his visit.  Did the patient verbally acknowledge consent to treatment? In[place], RN 10/16/2018 2:04 PM   CONSENT FOR TELE-HEALTH VISIT - PLEASE REVIEW  I hereby voluntarily request, consent and authorize [facility] and its employed or contracted physicians, physician assistants, nurse practitioners or other licensed health care professionals (the Practitioner), to provide me with telemedicine health care services (the "Services") as deemed necessary by the treating Practitioner. I acknowledge and consent to receive the Services by the  Practitioner via telemedicine. I understand that the telemedicine visit will involve communicating with the Practitioner through live audiovisual communication technology and the disclosure of certain medical information by electronic transmission. I acknowledge that I have been given the opportunity to request an in-person assessment or other available alternative prior to the telemedicine visit and am voluntarily participating in the telemedicine visit.  I understand that I have the right to withhold or withdraw my consent to the use of telemedicine in the course of my care at any time, without affecting my right to future care or treatment, and that the Practitioner or I may terminate the telemedicine visit at any time. I understand that I have the right to inspect all information obtained and/or recorded in the course of the telemedicine visit and may receive copies of available information for a reasonable fee.  I understand that some of the potential risks of receiving the Services via telemedicine include:  Marland Kitchen Delay or interruption in medical evaluation due to technological equipment failure or disruption; . Information transmitted may not be sufficient (e.g. poor resolution of images) to allow for appropriate medical decision making by the Practitioner; and/or  . In rare instances, security protocols could fail, causing a breach of personal health information.  Furthermore, I acknowledge that it is my responsibility to provide information about my medical history, conditions and care that is complete and accurate to the best of my ability. I acknowledge that Practitioner's advice, recommendations, and/or decision may be based on factors not within their control, such as incomplete or inaccurate data provided by me or distortions of diagnostic images or specimens that may result from electronic transmissions. I understand that  the practice of medicine is not an exact science and that Practitioner makes  no warranties or guarantees regarding treatment outcomes. I acknowledge that I will receive a copy of this consent concurrently upon execution via email to the email address I last provided but may also request a printed copy by calling the office of [facility].    I understand that my insurance will be billed for this visit.   I have read or had this consent read to me. . I understand the contents of this consent, which adequately explains the benefits and risks of the Services being provided via telemedicine.  . I have been provided ample opportunity to ask questions regarding this consent and the Services and have had my questions answered to my satisfaction. . I give my informed consent for the services to be provided through the use of telemedicine in my medical care  By participating in this telemedicine visit I agree to the above.

## 2018-10-21 DIAGNOSIS — I351 Nonrheumatic aortic (valve) insufficiency: Secondary | ICD-10-CM

## 2018-10-21 DIAGNOSIS — I77810 Thoracic aortic ectasia: Secondary | ICD-10-CM

## 2018-10-21 HISTORY — DX: Nonrheumatic aortic (valve) insufficiency: I35.1

## 2018-10-21 HISTORY — DX: Thoracic aortic ectasia: I77.810

## 2018-10-21 NOTE — Progress Notes (Signed)
Virtual Visit via Video Note   This visit type was conducted due to national recommendations for restrictions regarding the COVID-19 Pandemic (e.g. social distancing) in an effort to limit this patient's exposure and mitigate transmission in our community.  Due to his co-morbid illnesses, this patient is at least at moderate risk for complications without adequate follow up.  This format is felt to be most appropriate for this patient at this time.  All issues noted in this document were discussed and addressed.  A limited physical exam was performed with this format.  Please refer to the patient's chart for his consent to telehealth for [facility].  Evaluation Performed:  Follow-up visit  This visit type was conducted due to national recommendations for restrictions regarding the COVID-19 Pandemic (e.g. social distancing).  This format is felt to be most appropriate for this patient at this time.  All issues noted in this document were discussed and addressed.  No physical exam was performed (except for noted visual exam findings with Video Visits).  Please refer to the patient's chart (MyChart message for video visits and phone note for telephone visits) for the patient's consent to telehealth for [facility].  Date:  10/22/2018   ID:  Anthony Daugherty, DOB [birth year], MRN [id number]  Patient Location:  Home  Provider location:   [place]  PCP:  Tammi Sou, MD  Cardiologist:  NEW Electrophysiologist:  None   Chief Complaint:  Evaluation of aortic insufficiency and DCM  History of Present Illness:    Anthony Daugherty is a 73 y.o. male who presents via audio/video conferencing for a telehealth visit today in referral by Dr. Shawnie Dapper for evaluation of aortic stenosis and aortic insufficiency and mild aortic root enlargement.    This is a 73yo male with a history of aortic stenosis and AR with dilated aortic root.  He apparently had an echo 07/2017 showing mild  aortic stenosis and mild to moderate AR and aortic root 37mm.  He also has a history of HTN, hyperlipidemia, nonischemic DCM with EF 40-45 by cath and echo in 2012 and ASCAD with 80% OM lesion medically managed.  Repeat echo 08/2018 showed moderate AR with aortic root 19mm.  Unfortunately his echo also shows that his LVF has significantly declined with EF 20-25% with mildly dilated LV and diffuse HK.  There was severe LAE and moderate RAE and mild pulmonary HTN.  His PCP has now referred him for further cardiac workup.  He is here today for followup and is doing well.  He denies any chest pain or pressure, SOB, DOE, PND, orthopnea, LE edema, dizziness, palpitations or syncope. He is compliant with his meds and is tolerating meds with no SE.  He is somewhat active and does walk some.  The patient does not have symptoms concerning for COVID-19 infection (fever, chills, cough, or new shortness of breath).    Prior CV studies:   The following studies were reviewed today:  2D echo 2019 and 08/2018.  Past Medical History:  Diagnosis Date   ABNORMAL ELECTROCARDIOGRAM 07/19/2010   Acute otitis media of left ear with perforated tympanic membrane 02/25/2014   Anxiety state 06/14/2010   Qualifier: Diagnosis of  By: Anitra Lauth M.D., Brien Few    Aortic insufficiency 10/21/2018   Moderate AI by echo 08/2018   Aortic regurgitation    Mild-mod on echo 07/2017.  Moderate 08/2018.  Aortic root 3.8 cm (down from 4.54mm 2019).   Aortic stenosis, mild 10/12/2010  2012 cath and echo.  Same on echo 08/2018.   Arthritis    L-Spine   Bronchitis 02/05/2011   CARDIAC MURMUR 04/54/0981; 1914   Systolic and diastolic as of 01/8294.   Cardiomyopathy, nonischemic ([place]) 10/12/2010   Global hypokinesis, EF 40-45% by cath and echo (2012 echo and 2019 echo).  Feb 2020 EF 20-25%.   Coronary artery disease 11/16/2010   One small vessel with 80% lesion  Otherwise mild.  Keep on current regimen.    Cough 04/23/2013    Dilated aortic root ([place]) 10/21/2018   30mm by echo 08/2018   Diverticulosis    Dyslipidemia 11/16/2010   Lipids will need to be followed in about 4 wks.    Erectile dysfunction 08/16/2011   ESSENTIAL HYPERTENSION 07/19/2010   Qualifier: Diagnosis of  By: Anitra Lauth M.D., Brien Few    GAD (generalized anxiety disorder)    managed by Dr. Toy Care with prozac 20mg  qd   Gout    Usually Right great toe; colchicine helps well   Gouty arthritis of toe of left foot 05/27/2013   Hx of colonoscopy 2004; 2014   Initial was normal; 2014-polypectomy and diverticulosis.  Recall 2019.   Hyperlipidemia 10/23/2012   Hypertension    Low back strain 10/24/2013   Obesity, Class I, BMI 30-34.9    Single vessel coronary artery disease 10/12/10   80% OM lesion, med mgmt per cards   Skin lesion 12/20/2011   Thyroid nodule 04/23/2013   TOBACCO ABUSE 08/13/2010   Viral URI 07/23/2012   Past Surgical History:  Procedure Laterality Date   CARDIAC CATHETERIZATION  10/12/10   diagnostic only: 1 vessel CAD with TIMI 3 flow, EF 40-45%.  Mild AS.  Medical mgmt of CAD.   COLONOSCOPY W/ POLYPECTOMY  10/06/12   Diverticulosis and tubular adenoma w/out high grade dysplasia.  Repeat 5 yrs (Dr. Deatra Ina).   TRANSTHORACIC ECHOCARDIOGRAM  09/2010; 07/2017; 08/2018   2012:  EF 40-45%, global hypokinesis, mild AS.  07/2017: no interval change except mild/mod aortic regurg.  08/2018 EF 20-25%, global LV hypokinesis, diastolic dyfxn, biatrial enlargemt, mod AR, aortic root 3.8 cm.     Current Meds  Medication Sig   aspirin 81 MG tablet Take 81 mg by mouth daily.     atorvastatin (LIPITOR) 20 MG tablet TAKE 1 TABLET DAILY   BYSTOLIC 10 MG tablet TAKE 1 TABLET DAILY   colchicine 0.6 MG tablet TAKE 2 TABLETS AT ONSET OF GOUT PAIN, REPEAT IN 1 HOUR, THEN 1 TABLETTWICE/DAY TIL RESOLVED   FLUoxetine (PROZAC) 20 MG capsule 20 mg daily.   hydrochlorothiazide (HYDRODIURIL) 25 MG tablet TAKE 1 TABLET DAILY   irbesartan (AVAPRO)  150 MG tablet TAKE ONE TABLET AT BEDTIME     Allergies:   Penicillins   Social History   Tobacco Use   Smoking status: Never Smoker   Smokeless tobacco: Current User    Types: Chew   Tobacco comment: chews tobacco  Substance Use Topics   Alcohol use: Yes    Alcohol/week: 12.0 standard drinks    Types: 12 Cans of beer per week   Drug use: No     Family Hx: The patient's family history includes Alzheimer's disease in his mother; COPD in his father; Congenital heart disease in his brother; Heart failure in his father. There is no history of Colon cancer, Esophageal cancer, Stomach cancer, or Rectal cancer.  ROS:   Please see the history of present illness.     All other systems reviewed and  are negative.   Labs/Other Tests and Data Reviewed:    Recent Labs: 08/03/2018: ALT 13; BUN 16; Creatinine, Ser 1.01; Hemoglobin 14.2; Platelets 225.0; Potassium 3.5; Sodium 139   Recent Lipid Panel Lab Results  Component Value Date/Time   CHOL 143 08/03/2018 10:25 AM   TRIG 68.0 08/03/2018 10:25 AM   TRIG 105 05/10/2008   HDL 48.10 08/03/2018 10:25 AM   CHOLHDL 3 08/03/2018 10:25 AM   LDLCALC 81 08/03/2018 10:25 AM   LDLCALC 101 05/10/2008    Wt Readings from Last 3 Encounters:  10/22/18 173 lb (78.5 kg)  08/03/18 170 lb 8 oz (77.3 kg)  01/30/18 167 lb 8 oz (76 kg)     Objective:    Vital Signs:  BP (!) 167/85    Pulse 74    Ht 5' 5.5" (1.664 m)    Wt 173 lb (78.5 kg)    BMI 28.35 kg/m    CONSTITUTIONAL:  Well nourished, well developed male in no acute distress.  EYES: anicteric MOUTH: oral mucosa is pink RESPIRATORY: Normal respiratory effort, symmetric expansion CARDIOVASCULAR: No peripheral edema SKIN: No rash, lesions or ulcers MUSCULOSKELETAL: no digital cyanosis NEURO: Cranial Nerves II-XII grossly intact, moves all extremities PSYCH: Intact judgement and insight.  A&O x 3, Mood/affect appropriate   ASSESSMENT & PLAN:    1.  Aortic insufficiency - this  was moderate on recent echo.  Continue to follow with serial echo.   2.  Aortic stenosis - this was mild on recent echo although may be underestimated in the setting of severe LV dysfunction.  Dimentionless index was 0.43 and mean AVG 35mmHg.  AVA 2.89cm2.    3.  Dilated aortic root - recent echo showed aortic root at 86mm.  Repeat echo in 1 year.  Continue on statin.  BP is controlled.   4.  Dilated cardiomyopathy with severe LV dysfunction - he has a known hx of NIDCM with EF 40-45% by cath in 2012 with 1 vessel CAD of an OM.   Most recent 2D echo showed a significant decline in LVF from 40-45% now down to 20-25% with diffuse HK.  Etiology unknown.  His LV is mildly dilated.  He only has moderate AR so likely not the etiology.  He has not had any CP but does have a history of single vessel CAD in 2012 of an OM so need to consider progression of CAD.  His creatinine was normal on 07/2018.  I will change him from Bystolic to Carvedilol 25mg  BID and continue ARB.  Will need to be changed to [facility] but due to COVID-19 crisis will not change at this time due to need for lab testing if this is changed.  Continue on HCTZ for now and change to spiro 12.5mg  daily in June once COVID 19 crisis has improved and safe to come out to lab for BMET.  I have asked him to check his BP daily for a week and call me with the results.   He will ultimately need right and left heart cath but will wait until safe from the standpoint of COVID to be in the hospital.  He is asymptomatic at this time. I will see him back in June to get the cath set up.  I will also order labs to assess for secondary causes of cardiomyopathy including ferritin to rule out hemochromatosis, SPEP/UPEP to rule out amyloidosis and HIV.  These will also be done in June.  If cath shows no progression of coronary  disease then would consider PYP scan to rule out TTR amyloidosis.  5.  ASCAD - single vessel with 80% OM by cath 2012.  He has no anginal sx but his  LVF has significantly declined.  He will continue on ASA 81mg  daily, statin and BB.  Will ultimately need R and LHC once COVID crisis resolved.  6.  HTN - He checks his BP at home and it is controlled in the 742-595'G systolic despite being elevaetd this am..  He will continue on Irbesartan 150mg  daily and HCTZ 25mg  daily.  I will change Bystolic to Carvedilol 25mg  BID due to LV dysfunction.  I have asked him to check his BP daily for a week and call with the results.   7.  Hyperlipidemia - his LDL goal is < 70. His LDL was 81 on 08/03/2018. I have instructed him to increase Lipitor to 40mg  daily and check FLP and ALT in June.  COVID-19 Education: The signs and symptoms of COVID-19 were discussed with the patient and how to seek care for testing (follow up with PCP or arrange E-visit).  The importance of social distancing was discussed today.  Patient Risk:   After full review of this patient's clinical status, I feel that they are at least moderate risk at this time.  Time:   Today, I have spent 45 minutes with the patient reviewing chart and discussing medical problems including DCM, CHF, AS, AR and reviewing recent echo and discussing further testing, medicine changes and followup and reviewing symptoms of COVID 19 and the ways to protect against contracting the virus with telehealth technology.      Medication Adjustments/Labs and Tests Ordered: Current medicines are reviewed at length with the patient today.  Concerns regarding medicines are outlined above.  Tests Ordered: No orders of the defined types were placed in this encounter.  Medication Changes: No orders of the defined types were placed in this encounter.   Disposition:  Follow up in 2 month(s)  Signed, Fransico Him, MD  10/22/2018 9:09 AM    [facility]

## 2018-10-22 ENCOUNTER — Telehealth (INDEPENDENT_AMBULATORY_CARE_PROVIDER_SITE_OTHER): Payer: Medicare Other | Admitting: Cardiology

## 2018-10-22 ENCOUNTER — Other Ambulatory Visit: Payer: Self-pay

## 2018-10-22 ENCOUNTER — Encounter: Payer: Self-pay | Admitting: Cardiology

## 2018-10-22 VITALS — BP 167/85 | HR 74 | Ht 65.5 in | Wt 173.0 lb

## 2018-10-22 DIAGNOSIS — I2510 Atherosclerotic heart disease of native coronary artery without angina pectoris: Secondary | ICD-10-CM

## 2018-10-22 DIAGNOSIS — I10 Essential (primary) hypertension: Secondary | ICD-10-CM

## 2018-10-22 DIAGNOSIS — I77810 Thoracic aortic ectasia: Secondary | ICD-10-CM

## 2018-10-22 DIAGNOSIS — E7800 Pure hypercholesterolemia, unspecified: Secondary | ICD-10-CM

## 2018-10-22 DIAGNOSIS — I428 Other cardiomyopathies: Secondary | ICD-10-CM | POA: Diagnosis not present

## 2018-10-22 DIAGNOSIS — I351 Nonrheumatic aortic (valve) insufficiency: Secondary | ICD-10-CM | POA: Diagnosis not present

## 2018-10-22 DIAGNOSIS — I350 Nonrheumatic aortic (valve) stenosis: Secondary | ICD-10-CM | POA: Diagnosis not present

## 2018-10-22 DIAGNOSIS — E785 Hyperlipidemia, unspecified: Secondary | ICD-10-CM

## 2018-10-22 MED ORDER — CARVEDILOL 25 MG PO TABS: 25.0000 mg | ORAL_TABLET | Freq: Two times a day (BID) | ORAL | 3 refills | Status: DC

## 2018-10-22 MED ORDER — ATORVASTATIN CALCIUM 40 MG PO TABS: 40.0000 mg | ORAL_TABLET | Freq: Every day | ORAL | 3 refills | Status: DC

## 2018-10-22 NOTE — Patient Instructions (Signed)
Medication Instructions:  Stop: Bystolic  Start: Carvedilol 25 mg, twice a day, by mouth Increase: Lipitor 40 mg daily, by mouth  If you need a refill on your cardiac medications before your next appointment, please call your pharmacy.   Lab work: June: Call our to schedule labs in June, Lipid, Liver, TSH , Ferritin, SPEP and UPEP.   If you have labs (blood work) drawn today and your tests are completely normal, you will receive your results only by: Marland Kitchen MyChart Message (if you have MyChart) OR . A paper copy in the mail If you have any lab test that is abnormal or we need to change your treatment, we will call you to review the results.  Testing/Procedures: Your physician has requested that you have a cardiac catheterization in June. Cardiac catheterization is used to diagnose and/or treat various heart conditions. Doctors may recommend this procedure for a number of different reasons. The most common reason is to evaluate chest pain. Chest pain can be a symptom of coronary artery disease (CAD), and cardiac catheterization can show whether plaque is narrowing or blocking your heart's arteries. This procedure is also used to evaluate the valves, as well as measure the blood flow and oxygen levels in different parts of your heart. For further information please visit [email or web address]. Please follow instruction sheet, as given.  Follow-Up: Your physician recommends that you schedule a follow-up appointment in: June with Dr. Radford Pax for a Cath workup.

## 2018-10-25 ENCOUNTER — Encounter: Payer: Self-pay | Admitting: Family Medicine

## 2018-11-02 ENCOUNTER — Telehealth: Payer: Self-pay | Admitting: Cardiology

## 2018-11-02 NOTE — Telephone Encounter (Signed)
Per pt call BP report: 4/9   Am  167/85       Pm  114/57  4/11   Am  142/74  4/12   Am  123/57     Pm  121/65  4/13   Am   126/60      Pm  120/64  4/14    Am   145/69       4/15     Am    139/64    Pm 97/46   69hr  4/16    Am    139/64  61hr    Pm   128/57   60hr  4/17     Am   110/54   70hr     103/48     66hr     11:30 pm   112/53  80hr

## 2018-11-02 NOTE — Telephone Encounter (Signed)
  Patient is trying to use MyChart to send a message but Dr Radford Pax is not listed as one of his doctors on his [place]. Is there a way to add her? Can someone please call her to let her know if it is fixed.

## 2018-11-03 NOTE — Telephone Encounter (Signed)
The patient expressed understanding.

## 2018-11-03 NOTE — Telephone Encounter (Signed)
Added Dr. Radford Pax to the patient's care team. Called wife to inform her.

## 2018-11-03 NOTE — Telephone Encounter (Signed)
BPs look good with BP med change.  Continue current meds

## 2018-12-18 ENCOUNTER — Encounter: Payer: Self-pay | Admitting: Physician Assistant

## 2018-12-21 ENCOUNTER — Other Ambulatory Visit: Payer: Self-pay

## 2018-12-21 ENCOUNTER — Telehealth: Payer: Self-pay | Admitting: *Deleted

## 2018-12-21 ENCOUNTER — Encounter: Payer: Self-pay | Admitting: Physician Assistant

## 2018-12-21 ENCOUNTER — Other Ambulatory Visit (HOSPITAL_COMMUNITY)
Admission: RE | Admit: 2018-12-21 | Discharge: 2018-12-21 | Disposition: A | Discharge status: Home / Self Care | Payer: Medicare Other | Source: Ambulatory Visit | Attending: Cardiovascular Disease | Admitting: Cardiovascular Disease

## 2018-12-21 ENCOUNTER — Ambulatory Visit: Payer: Medicare Other | Admitting: Physician Assistant

## 2018-12-21 VITALS — BP 118/64 | HR 72 | Ht 65.5 in | Wt 174.8 lb

## 2018-12-21 DIAGNOSIS — I429 Cardiomyopathy, unspecified: Secondary | ICD-10-CM | POA: Diagnosis not present

## 2018-12-21 DIAGNOSIS — I10 Essential (primary) hypertension: Secondary | ICD-10-CM

## 2018-12-21 DIAGNOSIS — I77810 Thoracic aortic ectasia: Secondary | ICD-10-CM

## 2018-12-21 DIAGNOSIS — Z1159 Encounter for screening for other viral diseases: Secondary | ICD-10-CM | POA: Diagnosis present

## 2018-12-21 DIAGNOSIS — I447 Left bundle-branch block, unspecified: Secondary | ICD-10-CM

## 2018-12-21 DIAGNOSIS — I350 Nonrheumatic aortic (valve) stenosis: Secondary | ICD-10-CM | POA: Diagnosis not present

## 2018-12-21 MED ORDER — SODIUM CHLORIDE 0.9% FLUSH: 3.0000 mL | Freq: Two times a day (BID) | INTRAVENOUS | Status: DC

## 2018-12-21 NOTE — H&P (View-Only) (Signed)
Cardiology Office Note    Date:  12/21/2018  ID:  Anthony Daugherty, DOB Feb 23, 1946, MRN [id number] PCP:  Anthony Sou, MD  Cardiologist:  Anthony Him, MD   Chief Complaint: discuss cath  History of Present Illness:  Anthony Daugherty is a 73 y.o. male with history of nonischemic cardiomyopathy in 2012 with recent worsening LV function, nonobstructive CAD in 2012, habitual ETOH use, aortic stenosis/aortic regurgitation with dilated aortic root, HTN, HLD, mild pulmonary HTN, dyslipidemia, HTN, GAD, gout, diverticulosis, thyroid nodule, and chew tobacco use who presents to discuss cath.  He has a prior history of NICM with EF 40-45% by cath and echo in 2012 and CAD with 80% OM lesion medically managed, done by Dr. Haroldine Daugherty at that time. In 07/2017 his PCP repeated echo and EF remained stable. However, routine repeat echo 08/2018 showed further decline in EF to 15-40% with diastolic dysfunction, mildly enlarged RV, mildly enlarged LV, severe LAE/RAE, mild thickening of MV, mild aortic stenosis, mild dilation of aortic root, mild pulm HTN. He was subsequently referred to cardiology. Anthony Daugherty saw Daugherty new patient via telemedicine 10/22/18 and recommended to change Bystolic to carvedilol, with plans to eventually consider transition to Entresto/spironolactone. She also planned ferritin, SPEP/UPEP and HIV. She increased his atorvastatin to 40mg  daily. He has not had EKG since 2012 therefore decision was for physical visit in office with social distancing measures in place. EKG today shows new LBBB of unclear chronicity.  He is seen today to discuss heart cath and is feeling great. He said he was surprised to learn he had a heart problem because he has no symptoms to suggest this. He denies any CP, SOB, palpitations, edema, weight gain, syncope or orthopnea. He considers himself very active and is able to cut/chop wood without functional limitation. He drinks 3-4 12oz beers per day. No hx of carpal  tunnel or spinal stenosis. No family hx of heart disease or SCD.  Past Medical History:  Diagnosis Date   Aortic regurgitation    Mild-mod on echo 07/2017.  Moderate 08/2018.  Aortic root 3.8 cm (down from 4.50mm 2019).   Aortic stenosis, mild 10/12/2010   2012 cath and echo.  Same on echo 08/2018.   Arthritis    L-Spine   CARDIAC MURMUR 08/67/6195; 0932   Systolic and diastolic as of 12/7122.   Cardiomyopathy, nonischemic ([place]) 10/12/2010   Global hypokinesis, EF 40-45% by cath and echo (2012 echo and 2019 echo).  Feb 2020 EF 20-25%.   Coronary artery disease 11/16/2010   One small vessel with 80% lesion  Otherwise mild.  Keep on current regimen.    Dilated aortic root ([place]) 10/21/2018   73mm by echo 08/2018   Diverticulosis    Dyslipidemia    Erectile dysfunction 08/16/2011   ESSENTIAL HYPERTENSION 07/19/2010   Qualifier: Diagnosis of  By: Anthony Lauth M.D., Anthony Daugherty    GAD (generalized anxiety disorder)    managed by Dr. Toy Daugherty with prozac 20mg  qd   Gout    Usually Right great toe; colchicine helps well   Habitual alcohol use    Hx of colonoscopy 2004; 2014   Initial was normal; 2014-polypectomy and diverticulosis.  Recall 2019.   Hyperlipidemia 10/23/2012   Hypertension    Low back strain 10/24/2013   Obesity, Class I, BMI 30-34.9    Thyroid nodule 04/23/2013   TOBACCO ABUSE 08/13/2010    Past Surgical History:  Procedure Laterality Date   CARDIAC CATHETERIZATION  10/12/10   diagnostic  only: 1 vessel CAD with TIMI 3 flow, EF 40-45%.  Mild AS.  Medical mgmt of CAD.   COLONOSCOPY W/ POLYPECTOMY  10/06/12   Diverticulosis and tubular adenoma w/out high grade dysplasia.  Repeat 5 yrs (Dr. Deatra Daugherty).   TRANSTHORACIC ECHOCARDIOGRAM  09/2010; 07/2017; 08/2018   2012:  EF 40-45%, global hypokinesis, mild AS.  07/2017: no interval change except mild/mod aortic regurg.  08/2018 EF 20-25%, global LV hypokinesis, diastolic dyfxn, biatrial enlargemt, mod AR, aortic root 3.8 cm.     Current Medications: Current Meds  Medication Sig   aspirin 81 MG tablet Take 81 mg by mouth daily.     atorvastatin (LIPITOR) 40 MG tablet Take 1 tablet (40 mg total) by mouth daily.   carvedilol (COREG) 25 MG tablet Take 1 tablet (25 mg total) by mouth 2 (two) times daily.   colchicine 0.6 MG tablet TAKE 2 TABLETS AT ONSET OF GOUT PAIN, REPEAT IN 1 HOUR, THEN 1 TABLETTWICE/DAY TIL RESOLVED   FLUoxetine (PROZAC) 20 MG capsule 20 mg daily.   hydrochlorothiazide (HYDRODIURIL) 25 MG tablet TAKE 1 TABLET DAILY   irbesartan (AVAPRO) 150 MG tablet TAKE ONE TABLET AT BEDTIME     Allergies:   Penicillins   Social History   Socioeconomic History   Marital status: Married    Spouse name: Anthony Daugherty   Number of children: 2   Years of education: Not on file   Highest education level: Not on file  Occupational History   Occupation: Engineer, structural: [employer]  Social Needs   Financial resource strain: Not on file   Food insecurity:    Worry: Not on file    Inability: Not on file   Transportation needs:    Medical: Not on file    Non-medical: Not on file  Tobacco Use   Smoking status: Never Smoker   Smokeless tobacco: Current User    Types: Chew   Tobacco comment: chews tobacco  Substance and Sexual Activity   Alcohol use: Yes    Alcohol/week: 28.0 standard drinks    Types: 28 Cans of beer per week   Drug use: No   Sexual activity: Yes  Lifestyle   Physical activity:    Days per week: Not on file    Minutes per session: Not on file   Stress: Not on file  Relationships   Social connections:    Talks on phone: Not on file    Gets together: Not on file    Attends religious service: Not on file    Active member of club or organization: Not on file    Attends meetings of clubs or organizations: Not on file    Relationship status: Not on file  Other Topics Concern   Not on file  Social History Narrative   Delivery driver.   Married, two children.   Long hx of smoking but quit after 2010.     Family History:  The patient's family history includes Alzheimer's disease in his mother; COPD in his father; Congenital heart disease in his brother; Heart failure in his father. There is no history of Colon cancer, Esophageal cancer, Stomach cancer, or Rectal cancer.  ROS:   Please see the history of present illness.  All other systems are reviewed and otherwise negative.    PHYSICAL EXAM:   VS:  BP 118/64    Pulse 72    Ht 5' 5.5" (1.664 m)    Wt 174 lb 12.8 oz (79.3  kg)    BMI 28.65 kg/m   BMI: Body mass index is 28.65 kg/m. GEN: Well nourished, well developed WM in no acute distress. Appears fit HEENT: normocephalic, atraumatic. No macroglossia noted Neck: no JVD, carotid bruits, or masses. Cardiac: RRR; soft SEM RUSB. No rubs or gallops, no edema  Respiratory:  clear to auscultation bilaterally, normal work of breathing GI: soft, nontender, nondistended, + BS MS: no deformity or atrophy Skin: warm and dry, no rash Neuro:  Alert and Oriented x 3, Strength and sensation are intact, follows commands Psych: euthymic mood, full affect  Wt Readings from Last 3 Encounters:  12/21/18 174 lb 12.8 oz (79.3 kg)  10/22/18 173 lb (78.5 kg)  08/03/18 170 lb 8 oz (77.3 kg)      Studies/Labs Reviewed:   EKG:  EKG was ordered today and personally reviewed by me and demonstrates NSR 1st degree AVB, new LBBB compared to 2012 EKG, otherwise nonspecific changes related to bundle.  Recent Labs: 08/03/2018: ALT 13; BUN 16; Creatinine, Ser 1.01; Hemoglobin 14.2; Platelets 225.0; Potassium 3.5; Sodium 139   Lipid Panel    Component Value Date/Time   CHOL 143 08/03/2018 1025   TRIG 68.0 08/03/2018 1025   TRIG 105 05/10/2008   HDL 48.10 08/03/2018 1025   CHOLHDL 3 08/03/2018 1025   VLDL 13.6 08/03/2018 1025   LDLCALC 81 08/03/2018 1025   LDLCALC 101 05/10/2008    Additional studies/ records that were reviewed today  include: Summarized above   ASSESSMENT & PLAN:    1. Cardiomyopathy/chronic combined CHF - etiology of worsening LV dysfunction not currently known, but that is what we are here to evaluate. He is not manifesting any acute symptoms of heart failure and appears well compensated. As per Dr. Theodosia Blender recommendation, plan [facility] to elucidate development of ischemic heart disease. I also told Daugherty I am concerned that his ETOH could be contributing to his cardiomyopathy and we discussed importance of cutting down to abstinence. Risks and benefits of cardiac catheterization have been discussed with the patient.  These include bleeding, infection, kidney damage, stroke, heart attack, death.  The patient understands these risks and is willing to proceed. Covid testing set up by [facility]. He has no symptoms today to suggest this. Will obtain the previously recommended ferritin, HIV, SPEP/UPEP, lipids and liver function. Will also get TSH and pre-cath BMET and CBC. He is currently NYHA class I so I do not know that he would qualify for [facility], so will continue ARB. I would suggest considering changing HCTZ to spironolctone based on pre-cath labs. Will make this decision when I see them. If his workup is negative for ICM, would consider cMRI to exclude infiltrative disease. Reviewed 2g sodium restriction, 2L fluid restriction, daily weights with patient. Once meds are felt to be optimized and cath results are more clear, will need to decide on plan for repeating echocardiogram. He is noted to have interim development of a LBBB which means the issue of CRT may come up in future conversations of ICD if LV function remains poor.  2. Mild aortic stenosis - not currently manifesting any clinical signs to suggest underestimated severity. 3. Mildly dilated aortic root - anticipate this will be evaluated on f/u echocardiograms that will also be monitoring his LV dysfunction. 4. Essential HTN - controlled. Follow.  5. Left bundle  branch block - no symptoms to suggest bradycardia or syncope. He denies any chest pain history to suggest prior MI.  Disposition: F/u with me in virtual  f/u in approximately 2 weeks to f/u cath.  Medication Adjustments/Labs and Tests Ordered: Current medicines are reviewed at length with the patient today.  Concerns regarding medicines are outlined above. Medication changes, Labs and Tests ordered today are summarized above and listed in the Patient Instructions accessible in Encounters.   Signed, Charlie Pitter, PA-C  12/21/2018 1:53 PM    [facility] [place], [place], [place]  [address] Phone: [phone]; Fax: [phone]

## 2018-12-21 NOTE — Patient Instructions (Addendum)
Medication Instructions:  Your physician recommends that you continue on your current medications as directed. Please refer to the Current Medication list given to you today.  If you need a refill on your cardiac medications before your next appointment, please call your pharmacy.   Lab work: TODAY:  CMET, SPEP, UPEP, FERRITIN, TSH, LIPID, HIV, & CBC  If you have labs (blood work) drawn today and your tests are completely normal, you will receive your results only by:  [facility] (if you have MyChart) OR  A paper copy in the mail If you have any lab test that is abnormal or we need to change your treatment, we will call you to review the results.  Testing/Procedures: Your physician has requested that you have a cardiac catheterization. Cardiac catheterization is used to diagnose and/or treat various heart conditions. Doctors may recommend this procedure for a number of different reasons. The most common reason is to evaluate chest pain. Chest pain can be a symptom of coronary artery disease (CAD), and cardiac catheterization can show whether plaque is narrowing or blocking your hearts arteries. This procedure is also used to evaluate the valves, as well as measure the blood flow and oxygen levels in different parts of your heart. For further information please visit [email or web address]. Please follow instruction sheet, as given.     [facility] OFFICE [place], [place] [address] Dept: [phone] Loc: [phone]  Anthony Daugherty  12/21/2018  You are scheduled for a Cardiac Catheterization on Thursday, June 11 with Dr. Quay Burow.  1. Please arrive at the [facility] (Main Entrance A) at [facility]: [address] [place], [place] [address] at 9:30 AM (This time is two hours before your procedure to ensure your preparation). Free valet parking service is available.     Special note: Every effort is made to have your procedure done on time. Please understand that emergencies sometimes delay scheduled procedures.  2. Diet: Do not eat solid foods after midnight.  The patient may have clear liquids until 5am upon the day of the procedure.  3. Labs: WILL BE DRAWN TODAY.  4. Medication instructions in preparation for your procedure:   Contrast Allergy: No  \  Stop taking, HTCZ (Hydrochlorothiazide) Thursday, June 11,  On the morning of your procedure, take your Aspirin and any morning medicines NOT listed above.  You may use sips of water.  5. Plan for one night stay--bring personal belongings. 6. Bring a current list of your medications and current insurance cards. 7. You MUST have a responsible person to drive you home. 8. Someone MUST be with you the first 24 hours after you arrive home or your discharge will be delayed. 9. Please wear clothes that are easy to get on and off and wear slip-on shoes.  Thank you for allowing [place] to care for you!   --  [facility] Invasive Cardiovascular services   You will need to go to [facility] for the Covid 19 testing.  See instructions:  Your Pre-procedure COVID-19 Testing will be done on 12/21/2018 at 2:45 at [facility] at [address] [place],  [address] After your swab you will be given a mask to wear and instructed to go home and quarantine/no visitors until after your procedure. If you test positive you will be notified and your procedure will be cancelled.  Follow-Up: At [facility],  you and your health needs are our priority.  As part of our continuing mission to provide you with exceptional heart care, we have created designated Provider Care Teams.  These Care Teams include your primary Cardiologist (physician) and Advanced Practice Providers (APPs -  Physician Assistants and Nurse Practitioners) who all work together to provide you with  the care you need, when you need it. You are scheduled for a virtual follow-up visit with Melina Copa, PA-C 01/07/2019 at 11:30.  DO NOT COME TO THE OFFICE FOR THIS VISIT.  Someone will call you 15 mins prior to your appointment on this day.  YOUR CARDIOLOGY TEAM HAS ARRANGED FOR AN E-VISIT FOR YOUR APPOINTMENT - PLEASE REVIEW IMPORTANT INFORMATION BELOW SEVERAL DAYS PRIOR TO YOUR APPOINTMENT  Due to the recent COVID-19 pandemic, we are transitioning in-person office visits to tele-medicine visits in an effort to decrease unnecessary exposure to our patients, their families, and staff. These visits are billed to your insurance just like a normal visit is. We also encourage you to sign up for MyChart if you have not already done so. You will need a smartphone if possible. For patients that do not have this, we can still complete the visit using a regular telephone but do prefer a smartphone to enable video when possible. You may have a family member that lives with you that can help. If possible, we also ask that you have a blood pressure cuff and scale at home to measure your blood pressure, heart rate and weight prior to your scheduled appointment. Patients with clinical needs that need an in-person evaluation and testing will still be able to come to the office if absolutely necessary. If you have any questions, feel free to call our office.     YOUR PROVIDER WILL BE USING THE FOLLOWING PLATFORM TO COMPLETE YOUR VISIT: [email or web address]  2-3 DAYS BEFORE YOUR APPOINTMENT  You will receive a telephone call from one of our [facility] team members - your caller ID may say "Unknown caller." If this is a video visit, we will walk you through how to get the video launched on your phone. We will remind you check your blood pressure, heart rate and weight prior to your scheduled appointment. If you have an Apple Watch or Kardia, please upload any pertinent ECG strips the day before or morning of your appointment to  [facility]. Our staff will also make sure you have reviewed the consent and agree to move forward with your scheduled tele-health visit.     THE DAY OF YOUR APPOINTMENT  Approximately 15 minutes prior to your scheduled appointment, you will receive a telephone call from one of [facility] team - your caller ID may say "Unknown caller."  Our staff will confirm medications, vital signs for the day and any symptoms you may be experiencing. Please have this information available prior to the time of visit start. It may also be helpful for you to have a pad of paper and pen handy for any instructions given during your visit. They will also walk you through joining the smartphone meeting if this is a video visit.    CONSENT FOR TELE-HEALTH VISIT - PLEASE REVIEW  I hereby voluntarily request, consent and authorize [facility] and its employed or contracted physicians, physician assistants, nurse practitioners or other licensed health care professionals (the Practitioner), to provide me with telemedicine health care services (the Services") as deemed necessary by the treating Practitioner. I acknowledge and consent to receive the Services by the Practitioner  via telemedicine. I understand that the telemedicine visit will involve communicating with the Practitioner through live audiovisual communication technology and the disclosure of certain medical information by electronic transmission. I acknowledge that I have been given the opportunity to request an in-person assessment or other available alternative prior to the telemedicine visit and am voluntarily participating in the telemedicine visit.  I understand that I have the right to withhold or withdraw my consent to the use of telemedicine in the course of my care at any time, without affecting my right to future care or treatment, and that the Practitioner or I may terminate the telemedicine visit at any time. I understand that I have the right to inspect  all information obtained and/or recorded in the course of the telemedicine visit and may receive copies of available information for a reasonable fee.  I understand that some of the potential risks of receiving the Services via telemedicine include:   Delay or interruption in medical evaluation due to technological equipment failure or disruption;  Information transmitted may not be sufficient (e.g. poor resolution of images) to allow for appropriate medical decision making by the Practitioner; and/or   In rare instances, security protocols could fail, causing a breach of personal health information.  Furthermore, I acknowledge that it is my responsibility to provide information about my medical history, conditions and care that is complete and accurate to the best of my ability. I acknowledge that Practitioner's advice, recommendations, and/or decision may be based on factors not within their control, such as incomplete or inaccurate data provided by me or distortions of diagnostic images or specimens that may result from electronic transmissions. I understand that the practice of medicine is not an exact science and that Practitioner makes no warranties or guarantees regarding treatment outcomes. I acknowledge that I will receive a copy of this consent concurrently upon execution via email to the email address I last provided but may also request a printed copy by calling the office of [place] [facility].    I understand that my insurance will be billed for this visit.   I have read or had this consent read to me.  I understand the contents of this consent, which adequately explains the benefits and risks of the Services being provided via telemedicine.   I have been provided ample opportunity to ask questions regarding this consent and the Services and have had my questions answered to my satisfaction.  I give my informed consent for the services to be provided through the use of telemedicine in my  medical care  By participating in this telemedicine visit I agree to the above. PT HAS MYCHART BUT WAS FACE TO FACE IN AN OFFICE VISIT AND VERBALLY AGREED   Any Other Special Instructions Will Be Listed Below (If Applicable).  Coronary Angiogram With Stent Coronary angiogram with stent placement is a procedure to widen or open a narrow blood vessel of the heart (coronary artery). Arteries may become blocked by cholesterol buildup (plaques) in the lining of the wall. When a coronary artery becomes partially blocked, blood flow to that area decreases. This may lead to chest pain or a heart attack (myocardial infarction). A stent is a small piece of metal that looks like mesh or a spring. Stent placement may be done as treatment for a heart attack or right after a coronary angiogram in which a blocked artery is found. Let your health care provider know about:  Any allergies you have.  All medicines you are taking,  including vitamins, herbs, eye drops, creams, and over-the-counter medicines.  Any problems you or family members have had with anesthetic medicines.  Any blood disorders you have.  Any surgeries you have had.  Any medical conditions you have.  Whether you are pregnant or may be pregnant. What are the risks? Generally, this is a safe procedure. However, problems may occur, including:  Damage to the heart or its blood vessels.  A return of blockage.  Bleeding, infection, or bruising at the insertion site.  A collection of blood under the skin (hematoma) at the insertion site.  A blood clot in another part of the body.  Kidney injury.  Allergic reaction to the dye or contrast that is used.  Bleeding into the abdomen (retroperitoneal bleeding). What happens before the procedure? Staying hydrated Follow instructions from your health care provider about hydration, which may include:  Up to 2 hours before the procedure - you may continue to drink clear liquids, such as  water, clear fruit juice, black coffee, and plain tea.  Eating and drinking restrictions Follow instructions from your health care provider about eating and drinking, which may include:  8 hours before the procedure - stop eating heavy meals or foods such as meat, fried foods, or fatty foods.  6 hours before the procedure - stop eating light meals or foods, such as toast or cereal.  2 hours before the procedure - stop drinking clear liquids. Ask your health care provider about:  Changing or stopping your regular medicines. This is especially important if you are taking diabetes medicines or blood thinners.  Taking medicines such as ibuprofen. These medicines can thin your blood. Do not take these medicines before your procedure if your health care provider instructs you not to. Generally, aspirin is recommended before a procedure of passing a small, thin tube (catheter) through a blood vessel and into the heart (cardiac catheterization). What happens during the procedure?   An IV tube will be inserted into one of your veins.  You will be given one or more of the following: ? A medicine to help you relax (sedative). ? A medicine to numb the area where the catheter will be inserted into an artery (local anesthetic).  To reduce your risk of infection: ? Your health care team will wash or sanitize their hands. ? Your skin will be washed with soap. ? Hair may be removed from the area where the catheter will be inserted.  Using a guide wire, the catheter will be inserted into an artery. The location may be in your groin, in your wrist, or in the fold of your arm (near your elbow).  A type of X-ray (fluoroscopy) will be used to help guide the catheter to the opening of the arteries in the heart.  A dye will be injected into the catheter, and X-rays will be taken. The dye will help to show where any narrowing or blockages are located in the arteries.  A tiny wire will be guided to the  blocked spot, and a balloon will be inflated to make the artery wider.  The stent will be expanded and will crush the plaques into the wall of the vessel. The stent will hold the area open and improve the blood flow. Most stents have a drug coating to reduce the risk of the stent narrowing over time.  The artery may be made wider using a drill, laser, or other tools to remove plaques.  When the blood flow is better, the catheter  will be removed. The lining of the artery will grow over the stent, which stays where it was placed. This procedure may vary among health care providers and hospitals. What happens after the procedure?  If the procedure is done through the leg, you will be kept in bed lying flat for about 6 hours. You will be instructed to not bend and not cross your legs.  The insertion site will be checked frequently.  The pulse in your foot or wrist will be checked frequently.  You may have additional blood tests, X-rays, and a test that records the electrical activity of your heart (electrocardiogram, or ECG). This information is not intended to replace advice given to you by your health care provider. Make sure you discuss any questions you have with your health care provider. Document Released: 01/05/2003 Document Revised: 10/10/2017 Document Reviewed: 02/04/2016 Elsevier Interactive Patient Education  2019 [employer].

## 2018-12-21 NOTE — H&P (View-Only) (Signed)
Cardiology Office Note    Date:  12/21/2018  ID:  Anthony Daugherty, DOB 09-Aug-1945, MRN [id number] PCP:  Tammi Sou, MD  Cardiologist:  Fransico Him, MD   Chief Complaint: discuss cath  History of Present Illness:  Anthony Daugherty is a 73 y.o. male with history of nonischemic cardiomyopathy in 2012 with recent worsening LV function, nonobstructive CAD in 2012, habitual ETOH use, aortic stenosis/aortic regurgitation with dilated aortic root, HTN, HLD, mild pulmonary HTN, dyslipidemia, HTN, GAD, gout, diverticulosis, thyroid nodule, and chew tobacco use who presents to discuss cath.  He has a prior history of NICM with EF 40-45% by cath and echo in 2012 and CAD with 80% OM lesion medically managed, done by Dr. Haroldine Laws at that time. In 07/2017 his PCP repeated echo and EF remained stable. However, routine repeat echo 08/2018 showed further decline in EF to 24-26% with diastolic dysfunction, mildly enlarged RV, mildly enlarged LV, severe LAE/RAE, mild thickening of MV, mild aortic stenosis, mild dilation of aortic root, mild pulm HTN. He was subsequently referred to cardiology. Dr. Radford Pax saw him new patient via telemedicine 10/22/18 and recommended to change Bystolic to carvedilol, with plans to eventually consider transition to Entresto/spironolactone. She also planned ferritin, SPEP/UPEP and HIV. She increased his atorvastatin to 40mg  daily. He has not had EKG since 2012 therefore decision was for physical visit in office with social distancing measures in place. EKG today shows new LBBB of unclear chronicity.  He is seen today to discuss heart cath and is feeling great. He said he was surprised to learn he had a heart problem because he has no symptoms to suggest this. He denies any CP, SOB, palpitations, edema, weight gain, syncope or orthopnea. He considers himself very active and is able to cut/chop wood without functional limitation. He drinks 3-4 12oz beers per day. No hx of carpal  tunnel or spinal stenosis. No family hx of heart disease or SCD.  Past Medical History:  Diagnosis Date   Aortic regurgitation    Mild-mod on echo 07/2017.  Moderate 08/2018.  Aortic root 3.8 cm (down from 4.14mm 2019).   Aortic stenosis, mild 10/12/2010   2012 cath and echo.  Same on echo 08/2018.   Arthritis    L-Spine   CARDIAC MURMUR 83/41/9622; 2979   Systolic and diastolic as of 02/9210.   Cardiomyopathy, nonischemic ([place]) 10/12/2010   Global hypokinesis, EF 40-45% by cath and echo (2012 echo and 2019 echo).  Feb 2020 EF 20-25%.   Coronary artery disease 11/16/2010   One small vessel with 80% lesion  Otherwise mild.  Keep on current regimen.    Dilated aortic root ([place]) 10/21/2018   53mm by echo 08/2018   Diverticulosis    Dyslipidemia    Erectile dysfunction 08/16/2011   ESSENTIAL HYPERTENSION 07/19/2010   Qualifier: Diagnosis of  By: Anitra Lauth M.D., Brien Few    GAD (generalized anxiety disorder)    managed by Dr. Toy Care with prozac 20mg  qd   Gout    Usually Right great toe; colchicine helps well   Habitual alcohol use    Hx of colonoscopy 2004; 2014   Initial was normal; 2014-polypectomy and diverticulosis.  Recall 2019.   Hyperlipidemia 10/23/2012   Hypertension    Low back strain 10/24/2013   Obesity, Class I, BMI 30-34.9    Thyroid nodule 04/23/2013   TOBACCO ABUSE 08/13/2010    Past Surgical History:  Procedure Laterality Date   CARDIAC CATHETERIZATION  10/12/10   diagnostic  only: 1 vessel CAD with TIMI 3 flow, EF 40-45%.  Mild AS.  Medical mgmt of CAD.   COLONOSCOPY W/ POLYPECTOMY  10/06/12   Diverticulosis and tubular adenoma w/out high grade dysplasia.  Repeat 5 yrs (Dr. Deatra Ina).   TRANSTHORACIC ECHOCARDIOGRAM  09/2010; 07/2017; 08/2018   2012:  EF 40-45%, global hypokinesis, mild AS.  07/2017: no interval change except mild/mod aortic regurg.  08/2018 EF 20-25%, global LV hypokinesis, diastolic dyfxn, biatrial enlargemt, mod AR, aortic root 3.8 cm.     Current Medications: Current Meds  Medication Sig   aspirin 81 MG tablet Take 81 mg by mouth daily.     atorvastatin (LIPITOR) 40 MG tablet Take 1 tablet (40 mg total) by mouth daily.   carvedilol (COREG) 25 MG tablet Take 1 tablet (25 mg total) by mouth 2 (two) times daily.   colchicine 0.6 MG tablet TAKE 2 TABLETS AT ONSET OF GOUT PAIN, REPEAT IN 1 HOUR, THEN 1 TABLETTWICE/DAY TIL RESOLVED   FLUoxetine (PROZAC) 20 MG capsule 20 mg daily.   hydrochlorothiazide (HYDRODIURIL) 25 MG tablet TAKE 1 TABLET DAILY   irbesartan (AVAPRO) 150 MG tablet TAKE ONE TABLET AT BEDTIME     Allergies:   Penicillins   Social History   Socioeconomic History   Marital status: Married    Spouse name: Thayer Headings   Number of children: 2   Years of education: Not on file   Highest education level: Not on file  Occupational History   Occupation: Engineer, structural: [employer]  Social Needs   Financial resource strain: Not on file   Food insecurity:    Worry: Not on file    Inability: Not on file   Transportation needs:    Medical: Not on file    Non-medical: Not on file  Tobacco Use   Smoking status: Never Smoker   Smokeless tobacco: Current User    Types: Chew   Tobacco comment: chews tobacco  Substance and Sexual Activity   Alcohol use: Yes    Alcohol/week: 28.0 standard drinks    Types: 28 Cans of beer per week   Drug use: No   Sexual activity: Yes  Lifestyle   Physical activity:    Days per week: Not on file    Minutes per session: Not on file   Stress: Not on file  Relationships   Social connections:    Talks on phone: Not on file    Gets together: Not on file    Attends religious service: Not on file    Active member of club or organization: Not on file    Attends meetings of clubs or organizations: Not on file    Relationship status: Not on file  Other Topics Concern   Not on file  Social History Narrative   Delivery driver.   Married, two children.   Long hx of smoking but quit after 2010.     Family History:  The patient's family history includes Alzheimer's disease in his mother; COPD in his father; Congenital heart disease in his brother; Heart failure in his father. There is no history of Colon cancer, Esophageal cancer, Stomach cancer, or Rectal cancer.  ROS:   Please see the history of present illness.  All other systems are reviewed and otherwise negative.    PHYSICAL EXAM:   VS:  BP 118/64    Pulse 72    Ht 5' 5.5" (1.664 m)    Wt 174 lb 12.8 oz (79.3  kg)    BMI 28.65 kg/m   BMI: Body mass index is 28.65 kg/m. GEN: Well nourished, well developed WM in no acute distress. Appears fit HEENT: normocephalic, atraumatic. No macroglossia noted Neck: no JVD, carotid bruits, or masses. Cardiac: RRR; soft SEM RUSB. No rubs or gallops, no edema  Respiratory:  clear to auscultation bilaterally, normal work of breathing GI: soft, nontender, nondistended, + BS MS: no deformity or atrophy Skin: warm and dry, no rash Neuro:  Alert and Oriented x 3, Strength and sensation are intact, follows commands Psych: euthymic mood, full affect  Wt Readings from Last 3 Encounters:  12/21/18 174 lb 12.8 oz (79.3 kg)  10/22/18 173 lb (78.5 kg)  08/03/18 170 lb 8 oz (77.3 kg)      Studies/Labs Reviewed:   EKG:  EKG was ordered today and personally reviewed by me and demonstrates NSR 1st degree AVB, new LBBB compared to 2012 EKG, otherwise nonspecific changes related to bundle.  Recent Labs: 08/03/2018: ALT 13; BUN 16; Creatinine, Ser 1.01; Hemoglobin 14.2; Platelets 225.0; Potassium 3.5; Sodium 139   Lipid Panel    Component Value Date/Time   CHOL 143 08/03/2018 1025   TRIG 68.0 08/03/2018 1025   TRIG 105 05/10/2008   HDL 48.10 08/03/2018 1025   CHOLHDL 3 08/03/2018 1025   VLDL 13.6 08/03/2018 1025   LDLCALC 81 08/03/2018 1025   LDLCALC 101 05/10/2008    Additional studies/ records that were reviewed today  include: Summarized above   ASSESSMENT & PLAN:    1. Cardiomyopathy/chronic combined CHF - etiology of worsening LV dysfunction not currently known, but that is what we are here to evaluate. He is not manifesting any acute symptoms of heart failure and appears well compensated. As per Dr. Theodosia Blender recommendation, plan [facility] to elucidate development of ischemic heart disease. I also told him I am concerned that his ETOH could be contributing to his cardiomyopathy and we discussed importance of cutting down to abstinence. Risks and benefits of cardiac catheterization have been discussed with the patient.  These include bleeding, infection, kidney damage, stroke, heart attack, death.  The patient understands these risks and is willing to proceed. Covid testing set up by [facility]. He has no symptoms today to suggest this. Will obtain the previously recommended ferritin, HIV, SPEP/UPEP, lipids and liver function. Will also get TSH and pre-cath BMET and CBC. He is currently NYHA class I so I do not know that he would qualify for [facility], so will continue ARB. I would suggest considering changing HCTZ to spironolctone based on pre-cath labs. Will make this decision when I see them. If his workup is negative for ICM, would consider cMRI to exclude infiltrative disease. Reviewed 2g sodium restriction, 2L fluid restriction, daily weights with patient. Once meds are felt to be optimized and cath results are more clear, will need to decide on plan for repeating echocardiogram. He is noted to have interim development of a LBBB which means the issue of CRT may come up in future conversations of ICD if LV function remains poor.  2. Mild aortic stenosis - not currently manifesting any clinical signs to suggest underestimated severity. 3. Mildly dilated aortic root - anticipate this will be evaluated on f/u echocardiograms that will also be monitoring his LV dysfunction. 4. Essential HTN - controlled. Follow.  5. Left bundle  branch block - no symptoms to suggest bradycardia or syncope. He denies any chest pain history to suggest prior MI.  Disposition: F/u with me in virtual  f/u in approximately 2 weeks to f/u cath.  Medication Adjustments/Labs and Tests Ordered: Current medicines are reviewed at length with the patient today.  Concerns regarding medicines are outlined above. Medication changes, Labs and Tests ordered today are summarized above and listed in the Patient Instructions accessible in Encounters.   Signed, Charlie Pitter, PA-C  12/21/2018 1:53 PM    [facility] [address], [place], [place]  [address] Phone: [phone]; Fax: [phone]

## 2018-12-21 NOTE — Progress Notes (Signed)
Cardiology Office Note    Date:  12/21/2018  ID:  LANGLEY INGALLS, DOB 1946/05/28, MRN [id number] PCP:  Tammi Sou, MD  Cardiologist:  Fransico Him, MD   Chief Complaint: discuss cath  History of Present Illness:  Anthony Daugherty is a 73 y.o. male with history of nonischemic cardiomyopathy in 2012 with recent worsening LV function, nonobstructive CAD in 2012, habitual ETOH use, aortic stenosis/aortic regurgitation with dilated aortic root, HTN, HLD, mild pulmonary HTN, dyslipidemia, HTN, GAD, gout, diverticulosis, thyroid nodule, and chew tobacco use who presents to discuss cath.  He has a prior history of NICM with EF 40-45% by cath and echo in 2012 and CAD with 80% OM lesion medically managed, done by Dr. Haroldine Laws at that time. In 07/2017 his PCP repeated echo and EF remained stable. However, routine repeat echo 08/2018 showed further decline in EF to 40-98% with diastolic dysfunction, mildly enlarged RV, mildly enlarged LV, severe LAE/RAE, mild thickening of MV, mild aortic stenosis, mild dilation of aortic root, mild pulm HTN. He was subsequently referred to cardiology. Dr. Radford Pax saw him new patient via telemedicine 10/22/18 and recommended to change Bystolic to carvedilol, with plans to eventually consider transition to Entresto/spironolactone. She also planned ferritin, SPEP/UPEP and HIV. She increased his atorvastatin to 40mg  daily. He has not had EKG since 2012 therefore decision was for physical visit in office with social distancing measures in place. EKG today shows new LBBB of unclear chronicity.  He is seen today to discuss heart cath and is feeling great. He said he was surprised to learn he had a heart problem because he has no symptoms to suggest this. He denies any CP, SOB, palpitations, edema, weight gain, syncope or orthopnea. He considers himself very active and is able to cut/chop wood without functional limitation. He drinks 3-4 12oz beers per day. No hx of carpal  tunnel or spinal stenosis. No family hx of heart disease or SCD.  Past Medical History:  Diagnosis Date   Aortic regurgitation    Mild-mod on echo 07/2017.  Moderate 08/2018.  Aortic root 3.8 cm (down from 4.87mm 2019).   Aortic stenosis, mild 10/12/2010   2012 cath and echo.  Same on echo 08/2018.   Arthritis    L-Spine   CARDIAC MURMUR 11/91/4782; 9562   Systolic and diastolic as of 07/3084.   Cardiomyopathy, nonischemic ([place]) 10/12/2010   Global hypokinesis, EF 40-45% by cath and echo (2012 echo and 2019 echo).  Feb 2020 EF 20-25%.   Coronary artery disease 11/16/2010   One small vessel with 80% lesion  Otherwise mild.  Keep on current regimen.    Dilated aortic root ([place]) 10/21/2018   62mm by echo 08/2018   Diverticulosis    Dyslipidemia    Erectile dysfunction 08/16/2011   ESSENTIAL HYPERTENSION 07/19/2010   Qualifier: Diagnosis of  By: Anitra Lauth M.D., Brien Few    GAD (generalized anxiety disorder)    managed by Dr. Toy Care with prozac 20mg  qd   Gout    Usually Right great toe; colchicine helps well   Habitual alcohol use    Hx of colonoscopy 2004; 2014   Initial was normal; 2014-polypectomy and diverticulosis.  Recall 2019.   Hyperlipidemia 10/23/2012   Hypertension    Low back strain 10/24/2013   Obesity, Class I, BMI 30-34.9    Thyroid nodule 04/23/2013   TOBACCO ABUSE 08/13/2010    Past Surgical History:  Procedure Laterality Date   CARDIAC CATHETERIZATION  10/12/10   diagnostic  only: 1 vessel CAD with TIMI 3 flow, EF 40-45%.  Mild AS.  Medical mgmt of CAD.   COLONOSCOPY W/ POLYPECTOMY  10/06/12   Diverticulosis and tubular adenoma w/out high grade dysplasia.  Repeat 5 yrs (Dr. Deatra Ina).   TRANSTHORACIC ECHOCARDIOGRAM  09/2010; 07/2017; 08/2018   2012:  EF 40-45%, global hypokinesis, mild AS.  07/2017: no interval change except mild/mod aortic regurg.  08/2018 EF 20-25%, global LV hypokinesis, diastolic dyfxn, biatrial enlargemt, mod AR, aortic root 3.8 cm.     Current Medications: Current Meds  Medication Sig   aspirin 81 MG tablet Take 81 mg by mouth daily.     atorvastatin (LIPITOR) 40 MG tablet Take 1 tablet (40 mg total) by mouth daily.   carvedilol (COREG) 25 MG tablet Take 1 tablet (25 mg total) by mouth 2 (two) times daily.   colchicine 0.6 MG tablet TAKE 2 TABLETS AT ONSET OF GOUT PAIN, REPEAT IN 1 HOUR, THEN 1 TABLETTWICE/DAY TIL RESOLVED   FLUoxetine (PROZAC) 20 MG capsule 20 mg daily.   hydrochlorothiazide (HYDRODIURIL) 25 MG tablet TAKE 1 TABLET DAILY   irbesartan (AVAPRO) 150 MG tablet TAKE ONE TABLET AT BEDTIME     Allergies:   Penicillins   Social History   Socioeconomic History   Marital status: Married    Spouse name: Thayer Headings   Number of children: 2   Years of education: Not on file   Highest education level: Not on file  Occupational History   Occupation: Engineer, structural: [employer]  Social Needs   Financial resource strain: Not on file   Food insecurity:    Worry: Not on file    Inability: Not on file   Transportation needs:    Medical: Not on file    Non-medical: Not on file  Tobacco Use   Smoking status: Never Smoker   Smokeless tobacco: Current User    Types: Chew   Tobacco comment: chews tobacco  Substance and Sexual Activity   Alcohol use: Yes    Alcohol/week: 28.0 standard drinks    Types: 28 Cans of beer per week   Drug use: No   Sexual activity: Yes  Lifestyle   Physical activity:    Days per week: Not on file    Minutes per session: Not on file   Stress: Not on file  Relationships   Social connections:    Talks on phone: Not on file    Gets together: Not on file    Attends religious service: Not on file    Active member of club or organization: Not on file    Attends meetings of clubs or organizations: Not on file    Relationship status: Not on file  Other Topics Concern   Not on file  Social History Narrative   Delivery driver.   Married, two children.   Long hx of smoking but quit after 2010.     Family History:  The patient's family history includes Alzheimer's disease in his mother; COPD in his father; Congenital heart disease in his brother; Heart failure in his father. There is no history of Colon cancer, Esophageal cancer, Stomach cancer, or Rectal cancer.  ROS:   Please see the history of present illness.  All other systems are reviewed and otherwise negative.    PHYSICAL EXAM:   VS:  BP 118/64    Pulse 72    Ht 5' 5.5" (1.664 m)    Wt 174 lb 12.8 oz (79.3  kg)    BMI 28.65 kg/m   BMI: Body mass index is 28.65 kg/m. GEN: Well nourished, well developed WM in no acute distress. Appears fit HEENT: normocephalic, atraumatic. No macroglossia noted Neck: no JVD, carotid bruits, or masses. Cardiac: RRR; soft SEM RUSB. No rubs or gallops, no edema  Respiratory:  clear to auscultation bilaterally, normal work of breathing GI: soft, nontender, nondistended, + BS MS: no deformity or atrophy Skin: warm and dry, no rash Neuro:  Alert and Oriented x 3, Strength and sensation are intact, follows commands Psych: euthymic mood, full affect  Wt Readings from Last 3 Encounters:  12/21/18 174 lb 12.8 oz (79.3 kg)  10/22/18 173 lb (78.5 kg)  08/03/18 170 lb 8 oz (77.3 kg)      Studies/Labs Reviewed:   EKG:  EKG was ordered today and personally reviewed by me and demonstrates NSR 1st degree AVB, new LBBB compared to 2012 EKG, otherwise nonspecific changes related to bundle.  Recent Labs: 08/03/2018: ALT 13; BUN 16; Creatinine, Ser 1.01; Hemoglobin 14.2; Platelets 225.0; Potassium 3.5; Sodium 139   Lipid Panel    Component Value Date/Time   CHOL 143 08/03/2018 1025   TRIG 68.0 08/03/2018 1025   TRIG 105 05/10/2008   HDL 48.10 08/03/2018 1025   CHOLHDL 3 08/03/2018 1025   VLDL 13.6 08/03/2018 1025   LDLCALC 81 08/03/2018 1025   LDLCALC 101 05/10/2008    Additional studies/ records that were reviewed today  include: Summarized above   ASSESSMENT & PLAN:    1. Cardiomyopathy/chronic combined CHF - etiology of worsening LV dysfunction not currently known, but that is what we are here to evaluate. He is not manifesting any acute symptoms of heart failure and appears well compensated. As per Dr. Theodosia Blender recommendation, plan [facility] to elucidate development of ischemic heart disease. I also told him I am concerned that his ETOH could be contributing to his cardiomyopathy and we discussed importance of cutting down to abstinence. Risks and benefits of cardiac catheterization have been discussed with the patient.  These include bleeding, infection, kidney damage, stroke, heart attack, death.  The patient understands these risks and is willing to proceed. Covid testing set up by [facility]. He has no symptoms today to suggest this. Will obtain the previously recommended ferritin, HIV, SPEP/UPEP, lipids and liver function. Will also get TSH and pre-cath BMET and CBC. He is currently NYHA class I so I do not know that he would qualify for [facility], so will continue ARB. I would suggest considering changing HCTZ to spironolctone based on pre-cath labs. Will make this decision when I see them. If his workup is negative for ICM, would consider cMRI to exclude infiltrative disease. Reviewed 2g sodium restriction, 2L fluid restriction, daily weights with patient. Once meds are felt to be optimized and cath results are more clear, will need to decide on plan for repeating echocardiogram. He is noted to have interim development of a LBBB which means the issue of CRT may come up in future conversations of ICD if LV function remains poor.  2. Mild aortic stenosis - not currently manifesting any clinical signs to suggest underestimated severity. 3. Mildly dilated aortic root - anticipate this will be evaluated on f/u echocardiograms that will also be monitoring his LV dysfunction. 4. Essential HTN - controlled. Follow.  5. Left bundle  branch block - no symptoms to suggest bradycardia or syncope. He denies any chest pain history to suggest prior MI.  Disposition: F/u with me in virtual  f/u in approximately 2 weeks to f/u cath.  Medication Adjustments/Labs and Tests Ordered: Current medicines are reviewed at length with the patient today.  Concerns regarding medicines are outlined above. Medication changes, Labs and Tests ordered today are summarized above and listed in the Patient Instructions accessible in Encounters.   Signed, Charlie Pitter, PA-C  12/21/2018 1:53 PM    [facility] [place], [place], [place]  [address] Phone: [phone]; Fax: [phone]

## 2018-12-21 NOTE — Telephone Encounter (Signed)
Called pt re: appt 12/22/2018, virtually.  Per Lisbeth Renshaw pt pt needs cath work up with EKG, so pt needed in office appt. Pt agreed on appt today, 12/21/2018 1:15 with Melina Copa, PA-C. Pt has answered no to all prescreening questions re: Covid 19.       COVID-19 Pre-Screening Questions:  . In the past 7 to 10 days have you had a cough,  shortness of breath, headache, congestion, fever (100 or greater) body aches, chills, sore throat, or sudden loss of taste or sense of smell? . Have you been around anyone with known Covid 19. . Have you been around anyone who is awaiting Covid 19 test results in the past 7 to 10 days? . Have you been around anyone who has been exposed to Covid 19, or has mentioned symptoms of Covid 19 within the past 7 to 10 days?  If you have any concerns/questions about symptoms patients report during screening (either on the phone or at threshold). Contact the provider seeing the patient or DOD for further guidance.  If neither are available contact a member of the leadership team.

## 2018-12-22 ENCOUNTER — Ambulatory Visit: Payer: Medicare Other | Admitting: Physician Assistant

## 2018-12-22 LAB — PROTEIN ELECTROPHORESIS, SERUM

## 2018-12-22 NOTE — Addendum Note (Signed)
Addended by: Gaetano Net on: 12/22/2018 09:49 AM   Modules accepted: Orders

## 2018-12-23 ENCOUNTER — Telehealth: Payer: Self-pay | Admitting: *Deleted

## 2018-12-23 LAB — COMPREHENSIVE METABOLIC PANEL
ALT: 22 IU/L (ref 0–44)
AST: 22 IU/L (ref 0–40)
Albumin/Globulin Ratio: 3.1 — ABNORMAL HIGH (ref 1.2–2.2)
Albumin: 4.6 g/dL (ref 3.7–4.7)
Alkaline Phosphatase: 64 IU/L (ref 39–117)
BUN/Creatinine Ratio: 10 (ref 10–24)
BUN: 10 mg/dL (ref 8–27)
Bilirubin Total: 0.9 mg/dL (ref 0.0–1.2)
CO2: 23 mmol/L (ref 20–29)
Calcium: 9 mg/dL (ref 8.6–10.2)
Chloride: 102 mmol/L (ref 96–106)
Creatinine, Ser: 0.98 mg/dL (ref 0.76–1.27)
GFR calc Af Amer: 89 mL/min/{1.73_m2} (ref >59)
GFR calc non Af Amer: 77 mL/min/{1.73_m2} (ref >59)
Globulin, Total: 1.5 g/dL (ref 1.5–4.5)
Glucose: 107 mg/dL — ABNORMAL HIGH (ref 65–99)
Potassium: 4 mmol/L (ref 3.5–5.2)
Sodium: 140 mmol/L (ref 134–144)
Total Protein: 6.1 g/dL (ref 6.0–8.5)

## 2018-12-23 LAB — PROTEIN ELECTROPHORESIS, SERUM
A/G Ratio: 1.7 (ref 0.7–1.7)
Albumin ELP: 3.8 g/dL (ref 2.9–4.4)
Alpha 1: 0.2 g/dL (ref 0.0–0.4)
Alpha 2: 0.6 g/dL (ref 0.4–1.0)
Beta: 0.9 g/dL (ref 0.7–1.3)
Gamma Globulin: 0.7 g/dL (ref 0.4–1.8)
Globulin, Total: 2.3 g/dL (ref 2.2–3.9)

## 2018-12-23 LAB — LIPID PANEL
Chol/HDL Ratio: 2.1 ratio (ref 0.0–5.0)
Cholesterol, Total: 152 mg/dL (ref 100–199)
HDL: 73 mg/dL (ref >39)
LDL Calculated: 63 mg/dL (ref 0–99)
Triglycerides: 80 mg/dL (ref 0–149)
VLDL Cholesterol Cal: 16 mg/dL (ref 5–40)

## 2018-12-23 LAB — NOVEL CORONAVIRUS, NAA (HOSP ORDER, SEND-OUT TO REF LAB; TAT 18-24 HRS): SARS-CoV-2, NAA: NOT DETECTED

## 2018-12-23 LAB — CBC
Hematocrit: 36.9 % — ABNORMAL LOW (ref 37.5–51.0)
Hemoglobin: 13.3 g/dL (ref 13.0–17.7)
MCH: 32 pg (ref 26.6–33.0)
MCHC: 36 g/dL — ABNORMAL HIGH (ref 31.5–35.7)
MCV: 89 fL (ref 79–97)
Platelets: 206 10*3/uL (ref 150–450)
RBC: 4.16 x10E6/uL (ref 4.14–5.80)
RDW: 12.3 % (ref 11.6–15.4)
WBC: 4.6 10*3/uL (ref 3.4–10.8)

## 2018-12-23 LAB — HIV ANTIBODY (ROUTINE TESTING W REFLEX): HIV Screen 4th Generation wRfx: NONREACTIVE

## 2018-12-23 LAB — TSH: TSH: 2.35 u[IU]/mL (ref 0.450–4.500)

## 2018-12-23 LAB — FERRITIN: Ferritin: 232 ng/mL (ref 30–400)

## 2018-12-23 NOTE — Telephone Encounter (Signed)
Pt contacted pre-catheterization scheduled at [facility] for: Thursday December 24, 2018 11:30 AM Verified arrival time and place: [place] Entrance A at: 9:30 AM  Covid-19 test date:12/21/18  No solid food after midnight prior to cath, clear liquids until 5 AM day of procedure. Contrast allergy: no  Hold: HCTZ-  AM meds can be  taken pre-cath with sip of water including: ASA 81 mg   Confirmed patient has responsible person to drive home post procedure and observe 24 hours after arriving home: yes  Due to Covid-19 pandemic no visitors are allowed in the hospital (unless cognitive impairment).  Their designated party will be called when their procedure is over for an update and to arrange pick up.  Patients are required to wear a mask when they enter the hospital.       COVID-19 Pre-Screening Questions:  . In the past 7 to 10 days have you had a cough,  shortness of breath, headache, congestion, fever (100 or greater) body aches, chills, sore throat, or sudden loss of taste or sense of smell? no . Have you been around anyone with known Covid 19? no . Have you been around anyone who is awaiting Covid 19 test results in the past 7 to 10 days? no . Have you been around anyone who has been exposed to Covid 19, or has mentioned symptoms of Covid 19 within the past 7 to 10 days? no  I reviewed procedure instructions/mask/visitor/Covid -19 screening questions,patient verbalized understanding,

## 2018-12-24 ENCOUNTER — Ambulatory Visit (HOSPITAL_COMMUNITY)
Admission: RE | Admit: 2018-12-24 | Discharge: 2018-12-24 | Disposition: A | Discharge status: Home / Self Care | Payer: Medicare Other | Attending: Cardiovascular Disease | Admitting: Cardiovascular Disease

## 2018-12-24 ENCOUNTER — Ambulatory Visit (HOSPITAL_COMMUNITY)
Admission: RE | Disposition: A | Discharge status: Home / Self Care | Payer: Medicare Other | Source: Home / Self Care | Attending: Cardiovascular Disease

## 2018-12-24 ENCOUNTER — Other Ambulatory Visit: Payer: Self-pay

## 2018-12-24 DIAGNOSIS — E785 Hyperlipidemia, unspecified: Secondary | ICD-10-CM | POA: Diagnosis not present

## 2018-12-24 DIAGNOSIS — Z8249 Family history of ischemic heart disease and other diseases of the circulatory system: Secondary | ICD-10-CM | POA: Insufficient documentation

## 2018-12-24 DIAGNOSIS — I110 Hypertensive heart disease with heart failure: Secondary | ICD-10-CM | POA: Insufficient documentation

## 2018-12-24 DIAGNOSIS — E669 Obesity, unspecified: Secondary | ICD-10-CM | POA: Diagnosis not present

## 2018-12-24 DIAGNOSIS — I272 Pulmonary hypertension, unspecified: Secondary | ICD-10-CM | POA: Insufficient documentation

## 2018-12-24 DIAGNOSIS — M1990 Unspecified osteoarthritis, unspecified site: Secondary | ICD-10-CM | POA: Insufficient documentation

## 2018-12-24 DIAGNOSIS — M109 Gout, unspecified: Secondary | ICD-10-CM | POA: Diagnosis not present

## 2018-12-24 DIAGNOSIS — I5030 Unspecified diastolic (congestive) heart failure: Secondary | ICD-10-CM | POA: Insufficient documentation

## 2018-12-24 DIAGNOSIS — Z880 Allergy status to penicillin: Secondary | ICD-10-CM | POA: Insufficient documentation

## 2018-12-24 DIAGNOSIS — Z6828 Body mass index (BMI) 28.0-28.9, adult: Secondary | ICD-10-CM | POA: Diagnosis not present

## 2018-12-24 DIAGNOSIS — I447 Left bundle-branch block, unspecified: Secondary | ICD-10-CM | POA: Diagnosis not present

## 2018-12-24 DIAGNOSIS — I429 Cardiomyopathy, unspecified: Secondary | ICD-10-CM

## 2018-12-24 DIAGNOSIS — Z79899 Other long term (current) drug therapy: Secondary | ICD-10-CM | POA: Insufficient documentation

## 2018-12-24 DIAGNOSIS — I2510 Atherosclerotic heart disease of native coronary artery without angina pectoris: Secondary | ICD-10-CM | POA: Diagnosis present

## 2018-12-24 DIAGNOSIS — I2584 Coronary atherosclerosis due to calcified coronary lesion: Secondary | ICD-10-CM | POA: Insufficient documentation

## 2018-12-24 DIAGNOSIS — I428 Other cardiomyopathies: Secondary | ICD-10-CM | POA: Diagnosis present

## 2018-12-24 DIAGNOSIS — Z7982 Long term (current) use of aspirin: Secondary | ICD-10-CM | POA: Diagnosis not present

## 2018-12-24 DIAGNOSIS — Z87891 Personal history of nicotine dependence: Secondary | ICD-10-CM | POA: Diagnosis not present

## 2018-12-24 DIAGNOSIS — I350 Nonrheumatic aortic (valve) stenosis: Secondary | ICD-10-CM | POA: Insufficient documentation

## 2018-12-24 HISTORY — PX: RIGHT HEART CATH AND CORONARY ANGIOGRAPHY: CATH118264

## 2018-12-24 LAB — POCT I-STAT 7, (LYTES, BLD GAS, ICA,H+H)
Bicarbonate: 24 mmol/L (ref 20.0–28.0)
Calcium, Ion: 1.2 mmol/L (ref 1.15–1.40)
HCT: 33 % — ABNORMAL LOW (ref 39.0–52.0)
Hemoglobin: 11.2 g/dL — ABNORMAL LOW (ref 13.0–17.0)
O2 Saturation: 99 %
Potassium: 3.9 mmol/L (ref 3.5–5.1)
Sodium: 139 mmol/L (ref 135–145)
TCO2: 25 mmol/L (ref 22–32)
pCO2 arterial: 36.2 mmHg (ref 32.0–48.0)
pH, Arterial: 7.429 (ref 7.350–7.450)
pO2, Arterial: 131 mmHg — ABNORMAL HIGH (ref 83.0–108.0)

## 2018-12-24 LAB — POCT I-STAT EG7
Acid-Base Excess: 2 mmol/L (ref 0.0–2.0)
Bicarbonate: 26.3 mmol/L (ref 20.0–28.0)
Bicarbonate: 25.6 mmol/L (ref 20.0–28.0)
Calcium, Ion: 1.21 mmol/L (ref 1.15–1.40)
Calcium, Ion: 1.23 mmol/L (ref 1.15–1.40)
HCT: 34 % — ABNORMAL LOW (ref 39.0–52.0)
HCT: 35 % — ABNORMAL LOW (ref 39.0–52.0)
Hemoglobin: 11.6 g/dL — ABNORMAL LOW (ref 13.0–17.0)
Hemoglobin: 11.9 g/dL — ABNORMAL LOW (ref 13.0–17.0)
O2 Saturation: 76 %
O2 Saturation: 73 %
Potassium: 4 mmol/L (ref 3.5–5.1)
Potassium: 4 mmol/L (ref 3.5–5.1)
Sodium: 140 mmol/L (ref 135–145)
Sodium: 140 mmol/L (ref 135–145)
TCO2: 28 mmol/L (ref 22–32)
TCO2: 27 mmol/L (ref 22–32)
pCO2, Ven: 41.2 mmHg — ABNORMAL LOW (ref 44.0–60.0)
pCO2, Ven: 42.9 mmHg — ABNORMAL LOW (ref 44.0–60.0)
pH, Ven: 7.413 (ref 7.250–7.430)
pH, Ven: 7.384 (ref 7.250–7.430)
pO2, Ven: 40 mmHg (ref 32.0–45.0)
pO2, Ven: 40 mmHg (ref 32.0–45.0)

## 2018-12-24 SURGERY — RIGHT HEART CATH AND CORONARY ANGIOGRAPHY
Anesthesia: LOCAL

## 2018-12-24 MED ORDER — HEPARIN SODIUM (PORCINE) 1000 UNIT/ML IJ SOLN
INTRAMUSCULAR | Status: DC | PRN
  Administered 2018-12-24: 4000 [IU] via INTRAVENOUS

## 2018-12-24 MED ORDER — IOHEXOL 350 MG/ML SOLN
INTRAVENOUS | Status: DC | PRN
  Administered 2018-12-24: 70 mL via INTRA_ARTERIAL

## 2018-12-24 MED ORDER — VERAPAMIL HCL 2.5 MG/ML IV SOLN
INTRAVENOUS | Status: DC | PRN
  Administered 2018-12-24: 5 mL via INTRA_ARTERIAL

## 2018-12-24 MED ORDER — MIDAZOLAM HCL 2 MG/2ML IJ SOLN
INTRAMUSCULAR | Status: DC | PRN
  Administered 2018-12-24: 1 mg via INTRAVENOUS

## 2018-12-24 MED ORDER — FENTANYL CITRATE (PF) 100 MCG/2ML IJ SOLN
INTRAMUSCULAR | Status: AC
  Filled 2018-12-24: qty 2

## 2018-12-24 MED ORDER — LIDOCAINE HCL (PF) 1 % IJ SOLN
INTRAMUSCULAR | Status: DC | PRN
  Administered 2018-12-24 (×2): 2 mL

## 2018-12-24 MED ORDER — HEPARIN (PORCINE) IN NACL 1000-0.9 UT/500ML-% IV SOLN
INTRAVENOUS | Status: DC | PRN
  Administered 2018-12-24: 500 mL

## 2018-12-24 MED ORDER — NITROGLYCERIN 1 MG/10 ML FOR IR/CATH LAB
INTRA_ARTERIAL | Status: AC
  Filled 2018-12-24: qty 10

## 2018-12-24 MED ORDER — ASPIRIN 81 MG PO CHEW
81.0000 mg | CHEWABLE_TABLET | ORAL | Status: AC
  Administered 2018-12-24: 11:00:00 81 mg via ORAL
  Filled 2018-12-24: qty 1

## 2018-12-24 MED ORDER — HEPARIN (PORCINE) IN NACL 1000-0.9 UT/500ML-% IV SOLN
INTRAVENOUS | Status: AC
  Filled 2018-12-24: qty 1000

## 2018-12-24 MED ORDER — SODIUM CHLORIDE 0.9 % IV SOLN
INTRAVENOUS | Status: DC
  Administered 2018-12-24: 10:00:00 via INTRAVENOUS

## 2018-12-24 MED ORDER — VERAPAMIL HCL 2.5 MG/ML IV SOLN
INTRAVENOUS | Status: AC
  Filled 2018-12-24: qty 2

## 2018-12-24 MED ORDER — SODIUM CHLORIDE 0.9% FLUSH: 3.0000 mL | INTRAVENOUS | Status: DC | PRN

## 2018-12-24 MED ORDER — FENTANYL CITRATE (PF) 100 MCG/2ML IJ SOLN
INTRAMUSCULAR | Status: DC | PRN
  Administered 2018-12-24: 25 ug via INTRAVENOUS

## 2018-12-24 MED ORDER — MIDAZOLAM HCL 2 MG/2ML IJ SOLN
INTRAMUSCULAR | Status: AC
  Filled 2018-12-24: qty 2

## 2018-12-24 MED ORDER — SODIUM CHLORIDE 0.9 % IV SOLN
250.0000 mL | INTRAVENOUS | Status: DC | PRN
  Administered 2018-12-24: 250 mL via INTRAVENOUS

## 2018-12-24 SURGICAL SUPPLY — 14 items
CATH BALLN WEDGE 5F 110CM (CATHETERS) ×2 IMPLANT
CATH INFINITI 5FR ANG PIGTAIL (CATHETERS) ×2 IMPLANT
CATH OPTITORQUE TIG 4.0 5F (CATHETERS) ×2 IMPLANT
DEVICE RAD COMP TR BAND LRG (VASCULAR PRODUCTS) ×2 IMPLANT
GLIDESHEATH SLEND A-KIT 6F 22G (SHEATH) ×2 IMPLANT
GUIDEWIRE .025 260CM (WIRE) ×2 IMPLANT
GUIDEWIRE INQWIRE 1.5J.035X260 (WIRE) ×1 IMPLANT
INQWIRE 1.5J .035X260CM (WIRE) ×2
KIT HEART LEFT (KITS) ×2 IMPLANT
PACK CARDIAC CATHETERIZATION (CUSTOM PROCEDURE TRAY) ×2 IMPLANT
SHEATH GLIDE SLENDER 4/5FR (SHEATH) ×2 IMPLANT
TRANSDUCER W/STOPCOCK (MISCELLANEOUS) ×2 IMPLANT
TUBING CIL FLEX 10 FLL-RA (TUBING) ×2 IMPLANT
WIRE HI TORQ VERSACORE-J 145CM (WIRE) ×2 IMPLANT

## 2018-12-24 NOTE — Discharge Instructions (Signed)
Keep right arm above level of heart Radial Site Care  This sheet gives you information about how to care for yourself after your procedure. Your health care provider may also give you more specific instructions. If you have problems or questions, contact your health care provider. What can I expect after the procedure? After the procedure, it is common to have:  Bruising and tenderness at the catheter insertion area. Follow these instructions at home: Medicines  Take over-the-counter and prescription medicines only as told by your health care provider. Insertion site care  Follow instructions from your health care provider about how to take care of your insertion site. Make sure you: ? Wash your hands with soap and water before you change your bandage (dressing). If soap and water are not available, use hand sanitizer. ? Change your dressing as told by your health care provider. ? Leave stitches (sutures), skin glue, or adhesive strips in place. These skin closures may need to stay in place for 2 weeks or longer. If adhesive strip edges start to loosen and curl up, you may trim the loose edges. Do not remove adhesive strips completely unless your health care provider tells you to do that.  Check your insertion site every day for signs of infection. Check for: ? Redness, swelling, or pain. ? Fluid or blood. ? Pus or a bad smell. ? Warmth.  Do not take baths, swim, or use a hot tub until your health care provider approves.  You may shower 24-48 hours after the procedure, or as directed by your health care provider. ? Remove the dressing and gently wash the site with plain soap and water. ? Pat the area dry with a clean towel. ? Do not rub the site. That could cause bleeding.  Do not apply powder or lotion to the site. Activity   For 24 hours after the procedure, or as directed by your health care provider: ? Do not flex or bend the affected arm. ? Do not push or pull heavy  objects with the affected arm. ? Do not drive yourself home from the hospital or clinic. You may drive 24 hours after the procedure unless your health care provider tells you not to. ? Do not operate machinery or power tools.  Do not lift anything that is heavier than 10 lb (4.5 kg), or the limit that you are told, until your health care provider says that it is safe.  Ask your health care provider when it is okay to: ? Return to work or school. ? Resume usual physical activities or sports. ? Resume sexual activity. General instructions  If the catheter site starts to bleed, raise your arm and put firm pressure on the site. If the bleeding does not stop, get help right away. This is a medical emergency.  If you went home on the same day as your procedure, a responsible adult should be with you for the first 24 hours after you arrive home.  Keep all follow-up visits as told by your health care provider. This is important. Contact a health care provider if:  You have a fever.  You have redness, swelling, or yellow drainage around your insertion site. Get help right away if:  You have unusual pain at the radial site.  The catheter insertion area swells very fast.  The insertion area is bleeding, and the bleeding does not stop when you hold steady pressure on the area.  Your arm or hand becomes pale, cool, tingly, or  numb. These symptoms may represent a serious problem that is an emergency. Do not wait to see if the symptoms will go away. Get medical help right away. Call your local emergency services (911 in the [place] Do not drive yourself to the hospital. Summary  After the procedure, it is common to have bruising and tenderness at the site.  Follow instructions from your health care provider about how to take care of your radial site wound. Check the wound every day for signs of infection.  Do not lift anything that is heavier than 10 lb (4.5 kg), or the limit that you are told,  until your health care provider says that it is safe. This information is not intended to replace advice given to you by your health care provider. Make sure you discuss any questions you have with your health care provider. Document Released: 08/03/2010 Document Revised: 08/06/2017 Document Reviewed: 08/06/2017 Elsevier Interactive Patient Education  2019 [employer].

## 2018-12-24 NOTE — Interval H&P Note (Signed)
Cath Lab Visit (complete for each Cath Lab visit)  Clinical Evaluation Leading to the Procedure:   ACS: No.  Non-ACS:    Anginal Classification: No Symptoms  Anti-ischemic medical therapy: Minimal Therapy (1 class of medications)  Non-Invasive Test Results: No non-invasive testing performed  Prior CABG: No previous CABG      History and Physical Interval Note:  12/24/2018 12:41 PM  Joellyn Haff  has presented today for surgery, with the diagnosis of cardiomyopathy.  The various methods of treatment have been discussed with the patient and family. After consideration of risks, benefits and other options for treatment, the patient has consented to  Procedure(s): RIGHT/LEFT HEART CATH AND CORONARY ANGIOGRAPHY (N/A) as a surgical intervention.  The patient's history has been reviewed, patient examined, no change in status, stable for surgery.  I have reviewed the patient's chart and labs.  Questions were answered to the patient's satisfaction.     Quay Burow

## 2018-12-25 ENCOUNTER — Encounter (HOSPITAL_COMMUNITY): Payer: Self-pay | Admitting: Cardiovascular Disease

## 2018-12-25 MED FILL — Nitroglycerin IV Soln 100 MCG/ML in D5W: INTRA_ARTERIAL | Qty: 10 | Status: AC

## 2018-12-28 ENCOUNTER — Telehealth: Payer: Self-pay | Admitting: Physician Assistant

## 2018-12-28 NOTE — Telephone Encounter (Signed)
Patient said he has a note that states for him to return a specimen to [facility] and he is not sure what to do with it.

## 2018-12-28 NOTE — Telephone Encounter (Signed)
Returned pts call.  He has been made aware to return the Urinalysis back to [employer] on the 1st floor of the office building, and as far as another procedure, pt is aware that his message has been fwd to Dr. Gwenlyn Found for input.  Pt thanked me for the return call and clarification.

## 2018-12-29 ENCOUNTER — Other Ambulatory Visit: Payer: Self-pay | Admitting: Physician Assistant

## 2018-12-29 ENCOUNTER — Telehealth: Payer: Self-pay | Admitting: Physician Assistant

## 2018-12-29 DIAGNOSIS — I2510 Atherosclerotic heart disease of native coronary artery without angina pectoris: Secondary | ICD-10-CM

## 2018-12-29 LAB — UPEP/TP, 24-HR URINE
Albumin, U: 0 %
Alpha 1, Urine: 0 %
Alpha 2, Urine: 0 %
Beta, Urine: 0 %
Gamma Globulin, Urine: 0 %
Protein, 24H Urine: <72 mg/24 hr (ref 30–150)
Protein, Ur: <4 mg/dL

## 2018-12-29 MED ORDER — SODIUM CHLORIDE 0.9% FLUSH: 3.0000 mL | Freq: Two times a day (BID) | INTRAVENOUS | Status: DC

## 2018-12-29 NOTE — Telephone Encounter (Signed)
See phone note from today. Taken care of.

## 2018-12-29 NOTE — Progress Notes (Signed)
Pre cath orders 

## 2018-12-29 NOTE — Telephone Encounter (Signed)
Spoke with the wife, she expressed understanding about the atherectomy. The patient is getting labs and covid screening on 01/06/19. Instructions explained and sent to MyChart.

## 2018-12-29 NOTE — Telephone Encounter (Signed)
Scheduled the patient for atherectomy and stenting on 01/08/2019 at 1030 with Dr. Burt Knack.  Per Santiago Glad in the cath lab, cardmaster Wannetta Sender is to notify [employer] rep of date and time.   To Dr. Theodosia Blender nurse who will review instructions and set up COVID testing and labs with the patient.

## 2018-12-29 NOTE — Telephone Encounter (Signed)
   I reached out to Dr. Gwenlyn Found to discuss plan for cath. He reviewed with Dr. Burt Knack who indicates he agrees to do orbital rotational atherectomy and stenting of the proximal RCA. He is also going to do OM branch at the same time. Dr. Gwenlyn Found recommends to be set up in the next week or 2. Once this is set up, Dr. Burt Knack will need to be made aware of the patient and date so that they can arrange for the [employer] rep to be there.  I called patient to give him an update on the recommendation. Risks and benefits of cardiac catheterization have been discussed with the patient.  These include bleeding, infection, kidney damage, stroke, heart attack, death. The patient understands these risks and is willing to proceed. I also spoke with wife Thayer Headings as well.   Since this procedure has to be coordinated with Dr. Antionette Char schedule, I will send to his nurse Lenice Llamas to help arrange and call patient. Patient will need cath set up for procedure above, coordination of [employer] rep to be there, Dr. Burt Knack to be made aware of patient and time, pre-cath labs (BMET and CBC), pre-cath Covid testing and holding HCTZ day of cath. I am happy to do pre-cath orders once we get this scheduled.  Deshaun Weisinger PA-C

## 2019-01-04 ENCOUNTER — Other Ambulatory Visit: Payer: Self-pay | Admitting: *Deleted

## 2019-01-04 MED ORDER — CLOPIDOGREL BISULFATE 75 MG PO TABS: ORAL_TABLET | ORAL | 3 refills | Status: DC

## 2019-01-04 NOTE — Progress Notes (Signed)
  Anthony Mocha, MD  Katrine Coho, RN        Yes - should take plavix 300 mg x 1 then 75 mg daily thanks!      I spoke with patient, reviewed reason /instructions for starting Plavix, he verbalized understanding.

## 2019-01-06 ENCOUNTER — Other Ambulatory Visit (HOSPITAL_COMMUNITY)
Admission: RE | Admit: 2019-01-06 | Discharge: 2019-01-06 | Disposition: A | Discharge status: Home / Self Care | Payer: Medicare Other | Source: Ambulatory Visit | Attending: Cardiovascular Disease | Admitting: Cardiovascular Disease

## 2019-01-06 ENCOUNTER — Other Ambulatory Visit: Payer: Medicare Other | Admitting: *Deleted

## 2019-01-06 ENCOUNTER — Other Ambulatory Visit: Payer: Self-pay

## 2019-01-06 DIAGNOSIS — Z1159 Encounter for screening for other viral diseases: Secondary | ICD-10-CM | POA: Diagnosis present

## 2019-01-06 DIAGNOSIS — I2510 Atherosclerotic heart disease of native coronary artery without angina pectoris: Secondary | ICD-10-CM

## 2019-01-06 LAB — BASIC METABOLIC PANEL
BUN/Creatinine Ratio: 17 (ref 10–24)
BUN: 18 mg/dL (ref 8–27)
CO2: 22 mmol/L (ref 20–29)
Calcium: 9.5 mg/dL (ref 8.6–10.2)
Chloride: 99 mmol/L (ref 96–106)
Creatinine, Ser: 1.06 mg/dL (ref 0.76–1.27)
GFR calc Af Amer: 81 mL/min/{1.73_m2} (ref >59)
GFR calc non Af Amer: 70 mL/min/{1.73_m2} (ref >59)
Glucose: 83 mg/dL (ref 65–99)
Potassium: 4 mmol/L (ref 3.5–5.2)
Sodium: 136 mmol/L (ref 134–144)

## 2019-01-06 LAB — CBC WITH DIFFERENTIAL/PLATELET
Basophils Absolute: 0 10*3/uL (ref 0.0–0.2)
Basos: 1 %
EOS (ABSOLUTE): 0.3 10*3/uL (ref 0.0–0.4)
Eos: 7 %
Hematocrit: 39 % (ref 37.5–51.0)
Hemoglobin: 13.3 g/dL (ref 13.0–17.7)
Immature Grans (Abs): 0 10*3/uL (ref 0.0–0.1)
Immature Granulocytes: 0 %
Lymphocytes Absolute: 1.1 10*3/uL (ref 0.7–3.1)
Lymphs: 27 %
MCH: 32 pg (ref 26.6–33.0)
MCHC: 34.1 g/dL (ref 31.5–35.7)
MCV: 94 fL (ref 79–97)
Monocytes Absolute: 0.4 10*3/uL (ref 0.1–0.9)
Monocytes: 9 %
Neutrophils Absolute: 2.3 10*3/uL (ref 1.4–7.0)
Neutrophils: 56 %
Platelets: 210 10*3/uL (ref 150–450)
RBC: 4.15 x10E6/uL (ref 4.14–5.80)
RDW: 13.5 % (ref 11.6–15.4)
WBC: 4.1 10*3/uL (ref 3.4–10.8)

## 2019-01-06 LAB — SARS CORONAVIRUS 2 (TAT 6-24 HRS): SARS Coronavirus 2: NEGATIVE

## 2019-01-07 ENCOUNTER — Telehealth: Payer: Medicare Other | Admitting: Physician Assistant

## 2019-01-07 ENCOUNTER — Telehealth: Payer: Self-pay | Admitting: *Deleted

## 2019-01-07 NOTE — Progress Notes (Signed)
Pt has been made aware of normal result and verbalized understanding.  jw 01/07/2019

## 2019-01-07 NOTE — Telephone Encounter (Signed)
Pt contacted pre-coronary atherectomy  scheduled at [facility] for: Friday January 08, 2019 10:30 AM Verified arrival time and place: [facility] at: 8:30 AM  Covid-19 test date: 01/06/19  No solid food after midnight prior to cath, clear liquids until 5 AM day of procedure. Contrast allergy: no  Hold: HCTZ-AM of procedure.  Except hold medications AM meds can be  taken pre-cath with sip of water including: ASA 81 mg Plavix 75 mg  Confirmed patient has responsible person to drive home post procedure and observe 24 hours after arriving home: yes  Due to Covid-19 pandemic no visitors are allowed in the hospital (unless cognitive impairment).  Their designated party will be called when their procedure is over for an update and to arrange pick up.  Patients are required to wear a mask when they enter the hospital.       COVID-19 Pre-Screening Questions:  . In the past 7 to 10 days have you had a cough,  shortness of breath, headache, congestion, fever (100 or greater) body aches, chills, sore throat, or sudden loss of taste or sense of smell? no . Have you been around anyone with known Covid 19? no . Have you been around anyone who is awaiting Covid 19 test results in the past 7 to 10 days? . Have you been around anyone who has been exposed to Covid 19, or has mentioned symptoms of Covid 19 within the past 7 to 10 days? no  I reviewed procedure instructions, mask/visitor/Covid 19 screening questions with patient, he verbalized understanding, thanked me for call.

## 2019-01-08 ENCOUNTER — Encounter (HOSPITAL_COMMUNITY)
Admission: RE | Disposition: A | Discharge status: Home / Self Care | Payer: Self-pay | Source: Home / Self Care | Attending: Cardiovascular Disease

## 2019-01-08 ENCOUNTER — Other Ambulatory Visit: Payer: Self-pay

## 2019-01-08 ENCOUNTER — Ambulatory Visit (HOSPITAL_COMMUNITY)
Admission: RE | Admit: 2019-01-08 | Discharge: 2019-01-08 | Disposition: A | Discharge status: Home / Self Care | Payer: Medicare Other | Attending: Cardiovascular Disease | Admitting: Cardiovascular Disease

## 2019-01-08 DIAGNOSIS — Z6828 Body mass index (BMI) 28.0-28.9, adult: Secondary | ICD-10-CM | POA: Insufficient documentation

## 2019-01-08 DIAGNOSIS — I5022 Chronic systolic (congestive) heart failure: Secondary | ICD-10-CM | POA: Diagnosis not present

## 2019-01-08 DIAGNOSIS — I272 Pulmonary hypertension, unspecified: Secondary | ICD-10-CM | POA: Insufficient documentation

## 2019-01-08 DIAGNOSIS — E669 Obesity, unspecified: Secondary | ICD-10-CM | POA: Insufficient documentation

## 2019-01-08 DIAGNOSIS — Z8249 Family history of ischemic heart disease and other diseases of the circulatory system: Secondary | ICD-10-CM | POA: Diagnosis not present

## 2019-01-08 DIAGNOSIS — Z880 Allergy status to penicillin: Secondary | ICD-10-CM | POA: Insufficient documentation

## 2019-01-08 DIAGNOSIS — I252 Old myocardial infarction: Secondary | ICD-10-CM | POA: Insufficient documentation

## 2019-01-08 DIAGNOSIS — M109 Gout, unspecified: Secondary | ICD-10-CM | POA: Insufficient documentation

## 2019-01-08 DIAGNOSIS — Z87891 Personal history of nicotine dependence: Secondary | ICD-10-CM | POA: Insufficient documentation

## 2019-01-08 DIAGNOSIS — M1990 Unspecified osteoarthritis, unspecified site: Secondary | ICD-10-CM | POA: Insufficient documentation

## 2019-01-08 DIAGNOSIS — Z79899 Other long term (current) drug therapy: Secondary | ICD-10-CM | POA: Diagnosis not present

## 2019-01-08 DIAGNOSIS — E785 Hyperlipidemia, unspecified: Secondary | ICD-10-CM | POA: Diagnosis not present

## 2019-01-08 DIAGNOSIS — I447 Left bundle-branch block, unspecified: Secondary | ICD-10-CM | POA: Insufficient documentation

## 2019-01-08 DIAGNOSIS — I2510 Atherosclerotic heart disease of native coronary artery without angina pectoris: Secondary | ICD-10-CM | POA: Insufficient documentation

## 2019-01-08 DIAGNOSIS — I429 Cardiomyopathy, unspecified: Secondary | ICD-10-CM | POA: Diagnosis not present

## 2019-01-08 DIAGNOSIS — Z7982 Long term (current) use of aspirin: Secondary | ICD-10-CM | POA: Diagnosis not present

## 2019-01-08 DIAGNOSIS — I110 Hypertensive heart disease with heart failure: Secondary | ICD-10-CM | POA: Diagnosis not present

## 2019-01-08 HISTORY — PX: CORONARY ATHERECTOMY: CATH118238

## 2019-01-08 LAB — POCT ACTIVATED CLOTTING TIME
Activated Clotting Time: 224 seconds
Activated Clotting Time: 257 seconds
Activated Clotting Time: 191 seconds

## 2019-01-08 SURGERY — CORONARY ATHERECTOMY
Anesthesia: LOCAL

## 2019-01-08 MED ORDER — SODIUM CHLORIDE 0.9 % IV SOLN: 250.0000 mL | INTRAVENOUS | Status: DC | PRN

## 2019-01-08 MED ORDER — FENTANYL CITRATE (PF) 100 MCG/2ML IJ SOLN
INTRAMUSCULAR | Status: DC | PRN
  Administered 2019-01-08 (×2): 25 ug via INTRAVENOUS

## 2019-01-08 MED ORDER — SODIUM CHLORIDE 0.9% FLUSH: 3.0000 mL | Freq: Two times a day (BID) | INTRAVENOUS | Status: DC

## 2019-01-08 MED ORDER — MIDAZOLAM HCL 2 MG/2ML IJ SOLN
INTRAMUSCULAR | Status: DC | PRN
  Administered 2019-01-08: 2 mg via INTRAVENOUS
  Administered 2019-01-08: 1 mg via INTRAVENOUS

## 2019-01-08 MED ORDER — VERAPAMIL HCL 2.5 MG/ML IV SOLN
INTRAVENOUS | Status: AC
  Filled 2019-01-08: qty 2

## 2019-01-08 MED ORDER — VERAPAMIL HCL 2.5 MG/ML IV SOLN
INTRAVENOUS | Status: DC | PRN
  Administered 2019-01-08: 10 mL via INTRA_ARTERIAL

## 2019-01-08 MED ORDER — SODIUM CHLORIDE 0.9% FLUSH: 3.0000 mL | INTRAVENOUS | Status: DC | PRN

## 2019-01-08 MED ORDER — VIPERSLIDE LUBRICANT OPTIME: TOPICAL | Status: DC | PRN

## 2019-01-08 MED ORDER — LIDOCAINE HCL (PF) 1 % IJ SOLN
INTRAMUSCULAR | Status: AC
  Filled 2019-01-08: qty 30

## 2019-01-08 MED ORDER — LIDOCAINE HCL (PF) 1 % IJ SOLN
INTRAMUSCULAR | Status: DC | PRN
  Administered 2019-01-08: 15 mL
  Administered 2019-01-08: 2 mL

## 2019-01-08 MED ORDER — FENTANYL CITRATE (PF) 100 MCG/2ML IJ SOLN
INTRAMUSCULAR | Status: AC
  Filled 2019-01-08: qty 2

## 2019-01-08 MED ORDER — HYDRALAZINE HCL 20 MG/ML IJ SOLN: 10.0000 mg | INTRAMUSCULAR | Status: DC | PRN

## 2019-01-08 MED ORDER — IOHEXOL 350 MG/ML SOLN
INTRAVENOUS | Status: DC | PRN
  Administered 2019-01-08: 45 mL via INTRAVENOUS

## 2019-01-08 MED ORDER — HEPARIN (PORCINE) IN NACL 1000-0.9 UT/500ML-% IV SOLN
INTRAVENOUS | Status: DC | PRN
  Administered 2019-01-08 (×2): 500 mL

## 2019-01-08 MED ORDER — MIDAZOLAM HCL 2 MG/2ML IJ SOLN
INTRAMUSCULAR | Status: AC
  Filled 2019-01-08: qty 2

## 2019-01-08 MED ORDER — SODIUM CHLORIDE 0.9 % IV SOLN
INTRAVENOUS | Status: AC | PRN
  Administered 2019-01-08: 20 mL/h via INTRAVENOUS

## 2019-01-08 MED ORDER — SODIUM CHLORIDE 0.9 % IV SOLN
INTRAVENOUS | Status: DC
  Administered 2019-01-08: 09:00:00 via INTRAVENOUS

## 2019-01-08 MED ORDER — HEPARIN (PORCINE) IN NACL 1000-0.9 UT/500ML-% IV SOLN
INTRAVENOUS | Status: AC
  Filled 2019-01-08: qty 1000

## 2019-01-08 MED ORDER — NITROGLYCERIN 1 MG/10 ML FOR IR/CATH LAB
INTRA_ARTERIAL | Status: AC
  Filled 2019-01-08: qty 10

## 2019-01-08 MED ORDER — ASPIRIN 81 MG PO CHEW: 81.0000 mg | CHEWABLE_TABLET | ORAL | Status: DC

## 2019-01-08 MED ORDER — HEPARIN SODIUM (PORCINE) 1000 UNIT/ML IJ SOLN
INTRAMUSCULAR | Status: DC | PRN
  Administered 2019-01-08: 7000 [IU] via INTRAVENOUS
  Administered 2019-01-08: 4000 [IU] via INTRAVENOUS

## 2019-01-08 MED ORDER — HEPARIN SODIUM (PORCINE) 1000 UNIT/ML IJ SOLN
INTRAMUSCULAR | Status: AC
  Filled 2019-01-08: qty 1

## 2019-01-08 MED ORDER — LABETALOL HCL 5 MG/ML IV SOLN: 10.0000 mg | INTRAVENOUS | Status: DC | PRN

## 2019-01-08 MED ORDER — ACETAMINOPHEN 325 MG PO TABS: 650.0000 mg | ORAL_TABLET | ORAL | Status: DC | PRN

## 2019-01-08 MED ORDER — SODIUM CHLORIDE 0.9 % WEIGHT BASED INFUSION: 1.0000 mL/kg/h | INTRAVENOUS | Status: DC

## 2019-01-08 MED ORDER — ONDANSETRON HCL 4 MG/2ML IJ SOLN: 4.0000 mg | Freq: Four times a day (QID) | INTRAMUSCULAR | Status: DC | PRN

## 2019-01-08 SURGICAL SUPPLY — 22 items
BAG SNAP BAND KOVER 36X36 (MISCELLANEOUS) ×1 IMPLANT
CABLE ADAPT CONN TEMP 6FT (ADAPTER) ×1 IMPLANT
CATH LAUNCHER 6FR AL1 (CATHETERS) IMPLANT
CATH S G BIP PACING (CATHETERS) ×1 IMPLANT
CATH TELEPORT (CATHETERS) ×1 IMPLANT
CATHETER LAUNCHER 6FR AL1 (CATHETERS) ×2
COVER DOME SNAP 22 D (MISCELLANEOUS) ×1 IMPLANT
DEVICE RAD COMP TR BAND LRG (VASCULAR PRODUCTS) ×1 IMPLANT
ELECT DEFIB PAD ADLT CADENCE (PAD) ×2 IMPLANT
GLIDESHEATH SLEND SS 6F .021 (SHEATH) ×1 IMPLANT
GUIDEWIRE INQWIRE 1.5J.035X260 (WIRE) IMPLANT
INQWIRE 1.5J .035X260CM (WIRE) ×2
KIT ENCORE 26 ADVANTAGE (KITS) ×1 IMPLANT
KIT HEART LEFT (KITS) ×2 IMPLANT
LUBRICANT VIPERSLIDE CORONARY (MISCELLANEOUS) ×1 IMPLANT
PACK CARDIAC CATHETERIZATION (CUSTOM PROCEDURE TRAY) ×2 IMPLANT
SHEATH PINNACLE 6F 10CM (SHEATH) ×1 IMPLANT
SHEATH PROBE COVER 6X72 (BAG) ×1 IMPLANT
TRANSDUCER W/STOPCOCK (MISCELLANEOUS) ×2 IMPLANT
TUBING CIL FLEX 10 FLL-RA (TUBING) ×2 IMPLANT
WIRE HI TORQ WHISPER MS 300CM (WIRE) ×2 IMPLANT
WIRE VIPERWIRE COR FLEX .012 (WIRE) ×1 IMPLANT

## 2019-01-08 NOTE — Progress Notes (Addendum)
Site area: rt groin fv sheath Site Prior to Removal:  Level 0 Pressure Applied For: 20 minutes Manual:   yes Patient Status During Pull:  stable Post Pull Site:  Level  0 Post Pull Instructions Given:  yes Post Pull Pulses Present: rt dp palpable Dressing Applied:  Gauze and tegaderm Bedrest begins @ 0981 Comments:

## 2019-01-08 NOTE — Interval H&P Note (Signed)
Cath Lab Visit (complete for each Cath Lab visit)  Clinical Evaluation Leading to the Procedure:   ACS: No.  Non-ACS:    Anginal Classification: CCS I  Anti-ischemic medical therapy: Minimal Therapy (1 class of medications)  Non-Invasive Test Results: High-risk stress test findings: cardiac mortality >3%/year  Prior CABG: No previous CABG  LVEF 20-25% by echo, high-risk finding with severe LV systolic dysfunction  History and Physical Interval Note:  01/08/2019 1:04 PM  Anthony Daugherty  has presented today for surgery, with the diagnosis of cad.  The various methods of treatment have been discussed with the patient and family. After consideration of risks, benefits and other options for treatment, the patient has consented to  Procedure(s): CORONARY ATHERECTOMY (N/A) as a surgical intervention.  The patient's history has been reviewed, patient examined, no change in status, stable for surgery.  I have reviewed the patient's chart and labs.  Questions were answered to the patient's satisfaction.     Sherren Mocha

## 2019-01-08 NOTE — Progress Notes (Signed)
D/c instructions reviewed with wife Thayer Headings via telephone due to [place] restrictions.  All questions answered and Thayer Headings verbalized understanding

## 2019-01-08 NOTE — Discharge Instructions (Signed)
Femoral Site Care °This sheet gives you information about how to care for yourself after your procedure. Your health care provider may also give you more specific instructions. If you have problems or questions, contact your health care provider. °What can I expect after the procedure? °After the procedure, it is common to have: °· Bruising that usually fades within 1-2 weeks. °· Tenderness at the site. °Follow these instructions at home: °Wound care °· Follow instructions from your health care provider about how to take care of your insertion site. Make sure you: °? Wash your hands with soap and water before you change your bandage (dressing). If soap and water are not available, use hand sanitizer. °? Change your dressing as told by your health care provider. °? Leave stitches (sutures), skin glue, or adhesive strips in place. These skin closures may need to stay in place for 2 weeks or longer. If adhesive strip edges start to loosen and curl up, you may trim the loose edges. Do not remove adhesive strips completely unless your health care provider tells you to do that. °· Do not take baths, swim, or use a hot tub until your health care provider approves. °· You may shower 24-48 hours after the procedure or as told by your health care provider. °? Gently wash the site with plain soap and water. °? Pat the area dry with a clean towel. °? Do not rub the site. This may cause bleeding. °· Do not apply powder or lotion to the site. Keep the site clean and dry. °· Check your femoral site every day for signs of infection. Check for: °? Redness, swelling, or pain. °? Fluid or blood. °? Warmth. °? Pus or a bad smell. °Activity °· For the first 2-3 days after your procedure, or as long as directed: °? Avoid climbing stairs as much as possible. °? Do not squat. °· Do not lift anything that is heavier than 10 lb (4.5 kg), or the limit that you are told, until your health care provider says that it is safe. °· Rest as  directed. °? Avoid sitting for a long time without moving. Get up to take short walks every 1-2 hours. °· Do not drive for 24 hours if you were given a medicine to help you relax (sedative). °General instructions °· Take over-the-counter and prescription medicines only as told by your health care provider. °· Keep all follow-up visits as told by your health care provider. This is important. °Contact a health care provider if you have: °· A fever or chills. °· You have redness, swelling, or pain around your insertion site. °Get help right away if: °· The catheter insertion area swells very fast. °· You pass out. °· You suddenly start to sweat or your skin gets clammy. °· The catheter insertion area is bleeding, and the bleeding does not stop when you hold steady pressure on the area. °· The area near or just beyond the catheter insertion site becomes pale, cool, tingly, or numb. °These symptoms may represent a serious problem that is an emergency. Do not wait to see if the symptoms will go away. Get medical help right away. Call your local emergency services (911 in the [place] Do not drive yourself to the hospital. °Summary °· After the procedure, it is common to have bruising that usually fades within 1-2 weeks. °· Check your femoral site every day for signs of infection. °· Do not lift anything that is heavier than 10 lb (4.5 kg), or the   limit that you are told, until your health care provider says that it is safe. This information is not intended to replace advice given to you by your health care provider. Make sure you discuss any questions you have with your health care provider. Document Released: 03/04/2014 Document Revised: 07/14/2017 Document Reviewed: 07/14/2017 Elsevier Interactive Patient Education  2019 [place]  This sheet gives you information about how to care for yourself after your procedure. Your health care provider may also give you more specific instructions. If you  have problems or questions, contact your health care provider. What can I expect after the procedure? After the procedure, it is common to have:  Bruising and tenderness at the catheter insertion area. Follow these instructions at home: Medicines  Take over-the-counter and prescription medicines only as told by your health care provider. Insertion site care  Follow instructions from your health care provider about how to take care of your insertion site. Make sure you: ? Wash your hands with soap and water before you change your bandage (dressing). If soap and water are not available, use hand sanitizer. ? Change your dressing as told by your health care provider. ? Leave stitches (sutures), skin glue, or adhesive strips in place. These skin closures may need to stay in place for 2 weeks or longer. If adhesive strip edges start to loosen and curl up, you may trim the loose edges. Do not remove adhesive strips completely unless your health care provider tells you to do that.  Check your insertion site every day for signs of infection. Check for: ? Redness, swelling, or pain. ? Fluid or blood. ? Pus or a bad smell. ? Warmth.  Do not take baths, swim, or use a hot tub until your health care provider approves.  You may shower 24-48 hours after the procedure, or as directed by your health care provider. ? Remove the dressing and gently wash the site with plain soap and water. ? Pat the area dry with a clean towel. ? Do not rub the site. That could cause bleeding.  Do not apply powder or lotion to the site. Activity   For 24 hours after the procedure, or as directed by your health care provider: ? Do not flex or bend the affected arm. ? Do not push or pull heavy objects with the affected arm. ? Do not drive yourself home from the hospital or clinic. You may drive 24 hours after the procedure unless your health care provider tells you not to. ? Do not operate machinery or power  tools.  Do not lift anything that is heavier than 10 lb (4.5 kg), or the limit that you are told, until your health care provider says that it is safe.  Ask your health care provider when it is okay to: ? Return to work or school. ? Resume usual physical activities or sports. ? Resume sexual activity. General instructions  If the catheter site starts to bleed, raise your arm and put firm pressure on the site. If the bleeding does not stop, get help right away. This is a medical emergency.  If you went home on the same day as your procedure, a responsible adult should be with you for the first 24 hours after you arrive home.  Keep all follow-up visits as told by your health care provider. This is important. Contact a health care provider if:  You have a fever.  You have redness, swelling, or yellow drainage around your  site. °Get help right away if: °· You have unusual pain at the radial site. °· The catheter insertion area swells very fast. °· The insertion area is bleeding, and the bleeding does not stop when you hold steady pressure on the area. °· Your arm or hand becomes pale, cool, tingly, or numb. °These symptoms may represent a serious problem that is an emergency. Do not wait to see if the symptoms will go away. Get medical help right away. Call your local emergency services (911 in the [place] Do not drive yourself to the hospital. °Summary °· After the procedure, it is common to have bruising and tenderness at the site. °· Follow instructions from your health care provider about how to take care of your radial site wound. Check the wound every day for signs of infection. °· Do not lift anything that is heavier than 10 lb (4.5 kg), or the limit that you are told, until your health care provider says that it is safe. °This information is not intended to replace advice given to you by your health care provider. Make sure you discuss any questions you have with your health care  provider. °Document Released: 08/03/2010 Document Revised: 08/06/2017 Document Reviewed: 08/06/2017 °Elsevier Patient Education © 2020 [employer] ° °

## 2019-01-11 ENCOUNTER — Encounter (HOSPITAL_COMMUNITY): Payer: Self-pay | Admitting: Cardiovascular Disease

## 2019-01-12 ENCOUNTER — Telehealth: Payer: Self-pay | Admitting: *Deleted

## 2019-01-12 NOTE — Telephone Encounter (Signed)
Pt's appt 01/21/2019 is Anthony Daugherty's virtual day and will stay virtual. Pt in agreeance.

## 2019-01-12 NOTE — Telephone Encounter (Signed)
Call placed to pt re: appt 01/21/2019.  Pt has agreed to an in-office visit and has answered not to the following covid19 pre-screen questions.       COVID-19 Pre-Screening Questions:  . In the past 7 to 10 days have you had a cough,  shortness of breath, headache, congestion, fever (100 or greater) body aches, chills, sore throat, or sudden loss of taste or sense of smell? . Have you been around anyone with known Covid 19. . Have you been around anyone who is awaiting Covid 19 test results in the past 7 to 10 days? . Have you been around anyone who has been exposed to Covid 19, or has mentioned symptoms of Covid 19 within the past 7 to 10 days?  If you have any concerns/questions about symptoms patients report during screening (either on the phone or at threshold). Contact the provider seeing the patient or DOD for further guidance.  If neither are available contact a member of the leadership team.

## 2019-01-18 ENCOUNTER — Encounter: Payer: Self-pay | Admitting: Physician Assistant

## 2019-01-18 NOTE — Progress Notes (Addendum)
Virtual Visit via Video Note   This visit type was conducted due to national recommendations for restrictions regarding the COVID-19 Pandemic (e.g. social distancing) in an effort to limit this patient's exposure and mitigate transmission in our community.  Due to his co-morbid illnesses, this patient is at least at moderate risk for complications without adequate follow up.  This format is felt to be most appropriate for this patient at this time.  All issues noted in this document were discussed and addressed.  A limited physical exam was performed with this format.  The patient agreed to consent and proceed with telehealth for [facility].   Date:  01/21/2019   ID:  Anthony Daugherty, DOB 27-Dec-1945, MRN [id number]  Patient Location: Home Provider Location: Home  PCP:  Tammi Sou, MD  Cardiologist:  Fransico Him, MD  Electrophysiologist:  None   Evaluation Performed:  Follow-Up Visit  Chief Complaint:  F/u cath  History of Present Illness:    Anthony Daugherty is a 73 y.o. male with currently mixed ICM/NICM with recent worsening LV function, CAD as outlined below, LBBB, habitual ETOH use, mild aortic stenosis and moderate AI with dilated aortic root by echo 08/2018, HTN, HLD, mild pulmonary HTN, GAD, gout, diverticulosis, thyroid nodule, and chew tobacco use who presents for post-cath follow-up.  He has a prior history of NICM with EF 40-45% by cath and echo in 2012 and CAD with 80% OM lesion medically managed, done by Dr. Haroldine Laws at that time. In 07/2017 his PCP repeated echo and EF remained stable. However, routine repeat echo 08/2018 showed further decline in EF to 63-78% with diastolic dysfunction, mildly enlarged RV, mildly enlarged LV, severe LAE/RAE, mild thickening of MV, mild aortic stenosis, moderate aortic insufficiency, mild dilation of aortic root, mild pulm HTN. He was subsequently referred to cardiology. Dr. Radford Pax saw him new patient via telemedicine 10/22/18  and recommended to change Bystolic to carvedilol, with plans to eventually consider transition to Entresto/spironolactone. She increased his atorvastatin to 40mg  daily. Pre-cath f/u was arranged and EKG 12/21/18 showed new LBBB of unclear chronicity (not present 2012). The patient himself was surprised to learn he had a heart problem because he had no symptoms to suggest this. He underwent cath 12/24/18 showing 95% prox RCA, 75% OM1, 50% prox-mid LAD. It was felt difficult to determine whether these were contributing to his declining EF. He was set up for coronary atherectomy by Dr. Burt Knack 01/08/19 which was unsuccessful due to inability to cross the lesion. Medical therapy was recommended. Last labs 12/2018 showed K 4.0 and Cr 1.06, normal CBC, no M spike on UPEP, LDL 63, TSH and ferritin WNL. His cardiomyopathy is felt to be a mixed ICM/NICM, with possible contribution from alcohol.  The patent is seen via video for follow-up. He is doing well without any CP, SOB, LEE, orthopnea, PND, syncope. He seems a little discouraged at his diagnosis. His wife asked me to reiterate that he should be cutting down alcohol and quitting. The patient does not have symptoms concerning for COVID-19 infection (fever, chills, cough, or new shortness of breath). BP is running a little higher than normal but that's because he was anxious getting ready for this visit. His previous BP have hovered around 588-502 systolic recently.    Past Medical History:  Diagnosis Date   Aortic regurgitation    Mild-mod on echo 07/2017.  Moderate 08/2018.  Aortic root 3.8 cm (down from 4.32mm 2019).   Aortic stenosis, mild 10/12/2010  2012 cath and echo.  Same on echo 08/2018.   Arthritis    L-Spine   CARDIAC MURMUR 40/98/1191; 4782   Systolic and diastolic as of 03/5620.   Cardiomyopathy ([place]) 10/12/2010   Global hypokinesis, EF 40-45% by cath and echo (2012 echo and 2019 echo).  Feb 2020 EF 20-25%. - mixed ICM/NICM   Coronary artery  disease 11/16/2010   a. 2012 - 80% OM lesion. b. Cath 12/2018 - showing 95% prox RCA, 75% OM1, 50% prox-mid LAD. Unsuccessful PCI to RCA.   Dilated aortic root ([place]) 10/21/2018   4mm by echo 08/2018   Diverticulosis    Dyslipidemia    Erectile dysfunction 08/16/2011   ESSENTIAL HYPERTENSION 07/19/2010   Qualifier: Diagnosis of  By: Anitra Lauth M.D., Brien Few    GAD (generalized anxiety disorder)    managed by Dr. Toy Care with prozac 20mg  qd   Gout    Usually Right great toe; colchicine helps well   Habitual alcohol use    Hx of colonoscopy 2004; 2014   Initial was normal; 2014-polypectomy and diverticulosis.  Recall 2019.   Hyperlipidemia 10/23/2012   Hypertension    Low back strain 10/24/2013   Obesity, Class I, BMI 30-34.9    Thyroid nodule 04/23/2013   TOBACCO ABUSE 08/13/2010   Past Surgical History:  Procedure Laterality Date   CARDIAC CATHETERIZATION  10/12/10   diagnostic only: 1 vessel CAD with TIMI 3 flow, EF 40-45%.  Mild AS.  Medical mgmt of CAD.   COLONOSCOPY W/ POLYPECTOMY  10/06/12   Diverticulosis and tubular adenoma w/out high grade dysplasia.  Repeat 5 yrs (Dr. Deatra Ina).   CORONARY ATHERECTOMY N/A 01/08/2019   Procedure: CORONARY ATHERECTOMY;  Surgeon: Sherren Mocha, MD;  Location: [facility];  Service: Cardiovascular;  Laterality: N/A;   RIGHT HEART CATH AND CORONARY ANGIOGRAPHY N/A 12/24/2018   Procedure: RIGHT HEART CATH AND CORONARY ANGIOGRAPHY;  Surgeon: Lorretta Harp, MD;  Location: [facility];  Service: Cardiovascular;  Laterality: N/A;   TRANSTHORACIC ECHOCARDIOGRAM  09/2010; 07/2017; 08/2018   2012:  EF 40-45%, global hypokinesis, mild AS.  07/2017: no interval change except mild/mod aortic regurg.  08/2018 EF 20-25%, global LV hypokinesis, diastolic dyfxn, biatrial enlargemt, mod AR, aortic root 3.8 cm.    Current Meds  Medication Sig   acetaminophen (TYLENOL) 500 MG tablet Take 500-1,000 mg by mouth every 6 (six) hours as needed  (for pain.).   aspirin 81 MG tablet Take 81 mg by mouth 2 (two) times a week.    atorvastatin (LIPITOR) 20 MG tablet Take 1 tablet (20 mg total) by mouth daily.   carvedilol (COREG) 25 MG tablet Take 1 tablet (25 mg total) by mouth 2 (two) times daily.   clopidogrel (PLAVIX) 75 MG tablet Take 4 tablets (300mg ) by mouth x 1, then take 1 tablet (75 mg)  by mouth daiy   colchicine 0.6 MG tablet TAKE 2 TABLETS AT ONSET OF GOUT PAIN, REPEAT IN 1 HOUR, THEN 1 TABLETTWICE/DAY TIL RESOLVED   Hydrochlorothiazide 25 MG tablet Take 1 tablet (25 mg total) by mouth daily   FLUoxetine (PROZAC) 20 MG capsule Take 20 mg by mouth daily.    Irbesartan (AVAPRO) 150 mg tablet Take 1 tablet (150 mg total) by mouth daily.     Allergies:   Penicillins   Social History   Tobacco Use   Smoking status: Never Smoker   Smokeless tobacco: Current User    Types: Chew   Tobacco comment: chews tobacco  Substance Use Topics   Alcohol use: Yes    Alcohol/week: 28.0 standard drinks    Types: 28 Cans of beer per week   Drug use: No     Family Hx: The patient's family history includes Alzheimer's disease in his mother; COPD in his father; Congenital heart disease in his brother; Heart failure in his father. There is no history of Colon cancer, Esophageal cancer, Stomach cancer, or Rectal cancer.  ROS:   Please see the history of present illness.  Reports cath sites feel fine. Mild bruising at R hip but no hematoma or swelling at cath sites - bruising is nearly resolved. All other systems reviewed and are negative.   Prior CV studies:   Most recent pertinent cardiac studies are outlined above.   Labs/Other Tests and Data Reviewed:    EKG:  An ECG dated 12/21/18 was personally reviewed today and demonstrated:  NSR 1st degree AVB, new LBBB compared to 2012 EKG, otherwise nonspecific changes related to bundle.  Recent Labs: 12/21/2018: ALT 22; TSH 2.350 01/06/2019: BUN 18; Creatinine, Ser 1.06;  Hemoglobin 13.3; Platelets 210; Potassium 4.0; Sodium 136   Recent Lipid Panel Lab Results  Component Value Date/Time   CHOL 152 12/21/2018 02:25 PM   TRIG 80 12/21/2018 02:25 PM   TRIG 105 05/10/2008   HDL 73 12/21/2018 02:25 PM   CHOLHDL 2.1 12/21/2018 02:25 PM   CHOLHDL 3 08/03/2018 10:25 AM   LDLCALC 63 12/21/2018 02:25 PM   LDLCALC 101 05/10/2008    Wt Readings from Last 3 Encounters:  01/21/19 174 lb (78.9 kg)  01/08/19 170 lb (77.1 kg)  12/24/18 173 lb (78.5 kg)     Objective:    Vital Signs:  BP (!) 155/74    Pulse 66    Ht 5\' 5"  (1.651 m)    Wt 174 lb (78.9 kg)    BMI 28.96 kg/m   General - adult WM in no acute distress HEENT - NCAT, EOM intact Pulm - No labored breathing, no coughing during visit, no audible wheezing, speaking in full sentences Neuro - A+Ox3, no slurred speech, answers questions appropriately Cath sites - wrist appears normal, mild ecchymosis at right hip in late stages of resolution (very minor) Psych - Stoic, appears somewhat frustrated when discussing need to titrate meds    ASSESSMENT & PLAN:    1. Cardiomyopathy/chronic systolic CHF - medical therapy will now be the mainstay. I discussed his plan previously with Dr. Radford Pax who feels very strongly that he should be transitioned from irbesartan to [facility]. I agree with this plan. Will d/c irbesartan and start Entresto 49/51mg  BID. Given the diuretic component and potential for more potent BP effect, will stop HCTZ for now. (We may eventually use that BP room to titrate Entresto and/or add spironolactone.) They will send in BP readings via the portal on Monday for review. He would like [facility] option of having BMET and BP check - will arrange for 1 week given concomitant cessation of HCTZ as well. Once we feel his meds have been optimized, will need to get echo 3 months after. Also discussed importance of tapering alcohol with goal of long term cessation. I have already given him education on  lifestyle with low EF in general as well (2g sodium restriction, 2L fluid restriction, daily weights). 2. CAD - medical therapy recommended. It is unclear why he did not increase his atorvastatin previously. Increase to 40mg  qpm. We can consider repeat lipids later in the fall if  tolerating well. Since he did not have PCI after all, will reach out to Dr. Doneta Public about whether he should continue Plavix. He is clearly not thrilled about medicines in general. If stopping Plavix would change ASA back to daily. This was previously daily but the [facility] is pulling in twice a week for unclear reasons. 3. LBBB - no symptoms of bradycardia. Tolerating carvedilol. If LVEF does not improve with medical therapy will need to consider CRT.  4. Hyperlipidemia - increase atorvastatin as above. His LDL was actually controlled on previous value but CAD warrants higher dose. Consider repeat labs later this fall. Baseline LFTs wnl. 5. Aortic valve disorder follow clinically - mild aortic stenosis and moderate AI with dilated aortic root by echo 08/2018. Anticipate we'll be re-evaluating this once we optimize meds for HF.  COVID-19 Education: The signs and symptoms of COVID-19 were discussed with the patient and how to seek care for testing (follow up with PCP or arrange E-visit).  The importance of social distancing was discussed today.  Time:   Today, I have spent 18 minutes with the patient with telehealth technology discussing the above problems.     Medication Adjustments/Labs and Tests Ordered: Current medicines are reviewed at length with the patient today.  Concerns regarding medicines are outlined above.   Follow Up: in 3 weeks with myself or Dr. Theodosia Blender care team (OK to be virtual).  Signed, Charlie Pitter, PA-C  01/21/2019 10:39 AM    [place]

## 2019-01-21 ENCOUNTER — Telehealth (INDEPENDENT_AMBULATORY_CARE_PROVIDER_SITE_OTHER): Payer: Medicare Other | Admitting: Physician Assistant

## 2019-01-21 ENCOUNTER — Telehealth: Payer: Self-pay | Admitting: *Deleted

## 2019-01-21 ENCOUNTER — Other Ambulatory Visit: Payer: Self-pay

## 2019-01-21 ENCOUNTER — Telehealth: Payer: Self-pay | Admitting: Physician Assistant

## 2019-01-21 ENCOUNTER — Encounter: Payer: Self-pay | Admitting: Physician Assistant

## 2019-01-21 VITALS — BP 155/74 | HR 66 | Ht 65.0 in | Wt 174.0 lb

## 2019-01-21 DIAGNOSIS — E785 Hyperlipidemia, unspecified: Secondary | ICD-10-CM

## 2019-01-21 DIAGNOSIS — I429 Cardiomyopathy, unspecified: Secondary | ICD-10-CM

## 2019-01-21 DIAGNOSIS — I359 Nonrheumatic aortic valve disorder, unspecified: Secondary | ICD-10-CM

## 2019-01-21 DIAGNOSIS — I5022 Chronic systolic (congestive) heart failure: Secondary | ICD-10-CM

## 2019-01-21 DIAGNOSIS — I447 Left bundle-branch block, unspecified: Secondary | ICD-10-CM

## 2019-01-21 DIAGNOSIS — I2510 Atherosclerotic heart disease of native coronary artery without angina pectoris: Secondary | ICD-10-CM

## 2019-01-21 MED ORDER — ENTRESTO 49-51 MG PO TABS: 1.0000 | ORAL_TABLET | Freq: Two times a day (BID) | ORAL | 1 refills | Status: DC

## 2019-01-21 MED ORDER — ATORVASTATIN CALCIUM 40 MG PO TABS: 40.0000 mg | ORAL_TABLET | Freq: Every day | ORAL | 3 refills | Status: DC

## 2019-01-21 NOTE — Patient Instructions (Addendum)
Medication Instructions:  Your physician has recommended you make the following change in your medication:  1.  STOP the Irbesartain 2.  STOP the Hydrochlorothiazide 3.  START Entresto 49/51 take 1 tablet twice a day START 24 HOURS AFTER YOU STOPPED THE IRBESARTAN  4.  INCREASE the Atorvastatin to 40 mg taking 1 tablet daily.  You may use 2 of the 20 mg tablets to use them up  Make sure you take them at 1 time.  If you need a refill on your cardiac medications before your next appointment, please call your pharmacy.   Lab work: [facility] WILL COME OUT AND DRAW THE LABS:  BMET  If you have labs (blood work) drawn today and your tests are completely normal, you will receive your results only by: Marland Kitchen MyChart Message (if you have MyChart) OR . A paper copy in the mail If you have any lab test that is abnormal or we need to change your treatment, we will call you to review the results.  Testing/Procedures: We have placed a order for [facility] to come out in 1 week to get your vital signs and draw some blood work.  They will call you to arrange this.   Follow-Up: At [facility], you and your health needs are our priority.  As part of our continuing mission to provide you with exceptional heart care, we have created designated Provider Care Teams.  These Care Teams include your primary Cardiologist (physician) and Advanced Practice Providers (APPs -  Physician Assistants and Nurse Practitioners) who all work together to provide you with the care you need, when you need it. Dennis Bast are scheduled for another Virtual appointment with Eloisa Northern, PA-C, 02/04/2019 at 11:40  Any Other Special Instructions Will Be Listed Below (If Applicable).  Keep a log of BP and send on Monday  I will talk to your interventional team about whether we can stop Plavix/clopidogrel

## 2019-01-21 NOTE — Telephone Encounter (Signed)
Pt's wife advised.

## 2019-01-21 NOTE — Telephone Encounter (Signed)
Spoke with the pt re: the med changes and he requested I speak to his wife, Thayer Headings [phone]... I left a message for her to call our office back.

## 2019-01-21 NOTE — Telephone Encounter (Signed)
[place] Visit Initial Request  Date of Request ([place]):  January 21, 2019  Requesting Provider:  Melina Copa, [facility]    Agency Requested:    [facility] Contact:  Glory Buff, NP [address] [place], [place] [address] Phone #:  [phone] Fax #:  [phone]  Patient Demographic Information: Name:  Anthony Daugherty Age:  73 y.o.   DOB:  [birth year]  MRN:  [id number]   Address:   [place] [address]   Phone Numbers:   Home Phone [phone]  Mobile [phone]     Emergency Contact Information on File:   Contact Information    Name Relation Home Work Mobile   [name] Spouse   [phone]      The above family members may be contacted for information on this patient (review DPR on file):  Yes    Patient Clinical Information:  Primary Care Provider:  Tammi Sou, MD  Primary Cardiologist:  Fransico Him, MD  Primary Electrophysiologist:  None   Past Medical Hx: Anthony Daugherty  has a past medical history of Aortic regurgitation, Aortic stenosis, mild (10/12/2010), Arthritis, CARDIAC MURMUR (08/13/2010; 2019), Cardiomyopathy ([place]) (10/12/2010), Coronary artery disease (11/16/2010), Dilated aortic root ([place]) (10/21/2018), Diverticulosis, Dyslipidemia, Erectile dysfunction (08/16/2011), ESSENTIAL HYPERTENSION (07/19/2010), GAD (generalized anxiety disorder), Gout, Habitual alcohol use, colonoscopy (2004; 2014), Hyperlipidemia (10/23/2012), Hypertension, Low back strain (10/24/2013), Obesity, Class I, BMI 30-34.9, Thyroid nodule (04/23/2013), and TOBACCO ABUSE (08/13/2010).   Allergies: He is allergic to penicillins.   Medications: Current Outpatient Medications on File Prior to Visit  Medication Sig  . acetaminophen (TYLENOL) 500 MG tablet Take 500-1,000 mg by mouth every 6 (six) hours as needed (for pain.).  Marland Kitchen aspirin 81 MG tablet Take 81 mg by mouth 2 (two) times a week.   Marland Kitchen atorvastatin (LIPITOR) 40 MG  tablet Take 1 tablet (40 mg total) by mouth daily.  . carvedilol (COREG) 25 MG tablet Take 1 tablet (25 mg total) by mouth 2 (two) times daily.  . clopidogrel (PLAVIX) 75 MG tablet Take 4 tablets (300mg ) by mouth x 1, then take 1 tablet (75 mg)  by mouth daiy  . colchicine 0.6 MG tablet TAKE 2 TABLETS AT ONSET OF GOUT PAIN, REPEAT IN 1 HOUR, THEN 1 TABLETTWICE/DAY TIL RESOLVED  . FLUoxetine (PROZAC) 20 MG capsule Take 20 mg by mouth daily.   . sacubitril-valsartan (ENTRESTO) 49-51 MG Take 1 tablet by mouth 2 (two) times daily.   Current Facility-Administered Medications on File Prior to Visit  Medication  . sodium chloride flush (NS) 0.9 % injection 3 mL  . sodium chloride flush (NS) 0.9 % injection 3 mL     Social Hx: He  reports that he has never smoked. His smokeless tobacco use includes chew. He reports current alcohol use of about 28.0 standard drinks of alcohol per week. He reports that he does not use drugs.    Diagnosis/Reason for Visit:   VITALS/LABS  Services Requested:  Vital Signs (BP, Pulse, O2, Weight)  Labs:  BMET TO BE DONE IN 1 WEEK  # of Visits Needed/Frequency per Week: 1

## 2019-01-21 NOTE — Telephone Encounter (Signed)
   Please let pt know I reviewed Plavix/clopidogrel with Dr. Burt Knack and Dr. Radford Pax. Dr. Burt Knack agrees there is no further need for Plavix at this time. Please D/C off med list. He should be taking ASA '81mg'$  daily however. This was listed as daily when I met him in person but only said twice a week on today's intake. Should be daily. Please update pt and med list. Thanks Melina Copa PA-C

## 2019-01-21 NOTE — Telephone Encounter (Deleted)
[place] Visit Initial Request  Date of Request ([place]):  January 21, 2019  Requesting Provider:  ***    Agency Requested:    [facility] Contact:  Glory Buff, NP [place], [place] [address] Phone #:  [phone] Fax #:  [phone]  Patient Demographic Information: Name:  Anthony Daugherty Age:  73 y.o.   DOB:  Feb 07, 1946  MRN:  [id number]   Address:   [place] [address]   Phone Numbers:   Home Phone [phone]  Mobile [phone]     Emergency Contact Information on File:   Contact Information    Name Relation Home Work Mobile   [name] Spouse   [phone]      The above family members may be contacted for information on this patient (review DPR on file):  Yes    Patient Clinical Information:  Primary Care Provider:  Tammi Sou, MD  Primary Cardiologist:  Fransico Him, MD  Primary Electrophysiologist:  None   Past Medical Hx: Anthony Daugherty  has a past medical history of Aortic regurgitation, Aortic stenosis, mild (10/12/2010), Arthritis, CARDIAC MURMUR (08/13/2010; 2019), Cardiomyopathy ([place]) (10/12/2010), Coronary artery disease (11/16/2010), Dilated aortic root ([place]) (10/21/2018), Diverticulosis, Dyslipidemia, Erectile dysfunction (08/16/2011), ESSENTIAL HYPERTENSION (07/19/2010), GAD (generalized anxiety disorder), Gout, Habitual alcohol use, colonoscopy (2004; 2014), Hyperlipidemia (10/23/2012), Hypertension, Low back strain (10/24/2013), Obesity, Class I, BMI 30-34.9, Thyroid nodule (04/23/2013), and TOBACCO ABUSE (08/13/2010).   Allergies: He is allergic to penicillins.   Medications: Current Outpatient Medications on File Prior to Visit  Medication Sig  . acetaminophen (TYLENOL) 500 MG tablet Take 500-1,000 mg by mouth every 6 (six) hours as needed (for pain.).  Marland Kitchen aspirin 81 MG tablet Take 81 mg by mouth 2 (two) times a week.   Marland Kitchen atorvastatin (LIPITOR) 40 MG tablet Take  1 tablet (40 mg total) by mouth daily.  . carvedilol (COREG) 25 MG tablet Take 1 tablet (25 mg total) by mouth 2 (two) times daily.  . clopidogrel (PLAVIX) 75 MG tablet Take 4 tablets (300mg ) by mouth x 1, then take 1 tablet (75 mg)  by mouth daiy  . colchicine 0.6 MG tablet TAKE 2 TABLETS AT ONSET OF GOUT PAIN, REPEAT IN 1 HOUR, THEN 1 TABLETTWICE/DAY TIL RESOLVED  . FLUoxetine (PROZAC) 20 MG capsule Take 20 mg by mouth daily.   . sacubitril-valsartan (ENTRESTO) 49-51 MG Take 1 tablet by mouth 2 (two) times daily.   Current Facility-Administered Medications on File Prior to Visit  Medication  . sodium chloride flush (NS) 0.9 % injection 3 mL  . sodium chloride flush (NS) 0.9 % injection 3 mL     Social Hx: He  reports that he has never smoked. His smokeless tobacco use includes chew. He reports current alcohol use of about 28.0 standard drinks of alcohol per week. He reports that he does not use drugs.    Diagnosis/Reason for Visit:   VITALS/LABS  Services Requested:  Vital Signs (BP, Pulse, O2, Weight)  Labs:  BMET TO BE DONE IN 1 WEEK  # of Visits Needed/Frequency per Week: 1

## 2019-01-22 ENCOUNTER — Encounter: Payer: Self-pay | Admitting: Family Medicine

## 2019-01-27 ENCOUNTER — Encounter: Payer: Self-pay | Admitting: Family Medicine

## 2019-01-28 NOTE — Progress Notes (Signed)
Home Visit on behalf or [facility] to obtain VS and draw BMET.  Mr and Mrs. Marhe[place]ka were both present and had no complaints.  Stated all was well.  VS charted and BMET was drawn and brought the the [facility] on [address] in [place].

## 2019-01-29 ENCOUNTER — Telehealth: Payer: Self-pay

## 2019-01-29 LAB — BASIC METABOLIC PANEL
BUN/Creatinine Ratio: 10 (ref 10–24)
BUN: 11 mg/dL (ref 8–27)
CO2: 20 mmol/L (ref 20–29)
Calcium: 9.1 mg/dL (ref 8.6–10.2)
Chloride: 105 mmol/L (ref 96–106)
Creatinine, Ser: 1.11 mg/dL (ref 0.76–1.27)
GFR calc Af Amer: 76 mL/min/{1.73_m2} (ref >59)
GFR calc non Af Amer: 66 mL/min/{1.73_m2} (ref >59)
Glucose: 147 mg/dL — ABNORMAL HIGH (ref 65–99)
Potassium: 4.4 mmol/L (ref 3.5–5.2)
Sodium: 141 mmol/L (ref 134–144)

## 2019-01-29 NOTE — Telephone Encounter (Signed)
-----   Message from Anthony Daugherty, [place] sent at 01/29/2019  1:58 PM EDT ----- Please let pt know blood sugar is a little elevated but otherwise labs stable. Please find out how he's feeling on the Entresto and last 2 days of BP readings. We will continue present plan otherwise.

## 2019-01-29 NOTE — Telephone Encounter (Signed)
Notes recorded by Frederik Schmidt, RN on 01/29/2019 at 3:18 PM EDT  The patient has been notified of the result and verbalized understanding. All questions (if any) were answered.  Valerio Pinard, RN 01/29/2019 3:18 PM   The patient said that he has not felt any different on Entresto and his most recent BP from 7/16 evening was 138/96.  He will continue recording and submit them at his 7/22 visit with Dayna.

## 2019-01-29 NOTE — Telephone Encounter (Signed)
Notes recorded by Frederik Schmidt, RN on 01/29/2019 at 2:36 PM EDT  No answer, no VM  ------

## 2019-01-30 NOTE — Telephone Encounter (Signed)
Noted, see result note  

## 2019-02-01 ENCOUNTER — Telehealth: Payer: Self-pay | Admitting: *Deleted

## 2019-02-01 DIAGNOSIS — I5022 Chronic systolic (congestive) heart failure: Secondary | ICD-10-CM

## 2019-02-01 NOTE — Telephone Encounter (Deleted)
{  Select - Initial Request or Follow [id number]}

## 2019-02-01 NOTE — Telephone Encounter (Signed)
[place] Visit Follow Up Request   Date of Request ([place]):  February 01, 2019  Requesting Provider:  Melina Copa, PA    Agency Requested:    [facility] Contact:  Glory Buff, NP [address] [place], [place] [address] Phone #:  [phone] Fax #:  [phone]  Patient Demographic Information: Name:  Anthony Daugherty Age:  73 y.o.   DOB:  06-05-1946  MRN:  [id number]   Home visit progress note(s), lab results, telemetry strips, etc were reviewed.  Provider Recommendations: bmet  Follow up home services requested:  Labs:  bmet  All labs ordered for this home visit have been released and the request was sent to Chrissie Noa at [facility].

## 2019-02-02 ENCOUNTER — Encounter: Payer: Self-pay | Admitting: Family Medicine

## 2019-02-04 ENCOUNTER — Telehealth: Payer: Medicare Other | Admitting: Physician Assistant

## 2019-02-04 ENCOUNTER — Ambulatory Visit (INDEPENDENT_AMBULATORY_CARE_PROVIDER_SITE_OTHER): Payer: Medicare Other | Admitting: Family Medicine

## 2019-02-04 ENCOUNTER — Other Ambulatory Visit: Payer: Self-pay

## 2019-02-04 ENCOUNTER — Encounter: Payer: Self-pay | Admitting: Family Medicine

## 2019-02-04 DIAGNOSIS — I10 Essential (primary) hypertension: Secondary | ICD-10-CM | POA: Diagnosis not present

## 2019-02-04 DIAGNOSIS — E7800 Pure hypercholesterolemia, unspecified: Secondary | ICD-10-CM

## 2019-02-04 DIAGNOSIS — I428 Other cardiomyopathies: Secondary | ICD-10-CM | POA: Diagnosis not present

## 2019-02-04 NOTE — Progress Notes (Signed)
Virtual Visit via Video Note  I connected with pt on 02/04/19 at 10:00 AM EDT by a video enabled telemedicine application and verified that I am speaking with the correct person using two identifiers.  Location patient: home Location provider:work or home office Persons participating in the virtual visit: patient, provider  I discussed the limitations of evaluation and management by telemedicine and the availability of in person appointments. The patient expressed understanding and agreed to proceed.  Telemedicine visit is a necessity given the COVID-19 restrictions in place at the current time.  HPI: 73 y/o WM being seen today for f/u HTN, HLD, mixed ischemic/nonischemic CM. Cath 12/2018->chronically occluded RCA 100%-->unable to clear at all.  Med mgmt continued (EF 20-25%). Pt on entresto, coreg, ASA, Statin.  Denies any problems right now. He feels no sx's even with pushing lawn mower or going up and down steps.  HTN: home bp monitoring normally 120s-130s over 60-80. HR mid 60s.  HLD: taking atorva 40mg  qd and has no side effects.  ROS: no CP, no SOB, no wheezing, no cough, no dizziness, no HAs, no rashes, no melena/hematochezia.  No polyuria or polydipsia.  No myalgias or arthralgias.   Past Medical History:  Diagnosis Date  . Aortic regurgitation    Mild-mod on echo 07/2017.  Moderate 08/2018.  Aortic root 3.8 cm (down from 4.45mm 2019).  . Aortic stenosis, mild 10/12/2010   2012 cath and echo.  Same on echo 08/2018.  Marland Kitchen Arthritis    L-Spine  . CARDIAC MURMUR 35/70/1779; 3903   Systolic and diastolic as of 0/0923.  . Cardiomyopathy ([place]) 10/12/2010   Global hypokinesis, EF 40-45% by cath and echo (2012 echo and 2019 echo).  Feb 2020 EF 20-25%. - mixed ICM/NICM (alcohol and CAD)  . Coronary artery disease 11/16/2010   a. 2012 - 80% OM lesion. b. Cath 12/2018 - showing 95% prox RCA, 75% OM1, 50% prox-mid LAD. Unsuccessful PCI to 100% chronically occluded RCA.  . Dilated aortic  root ([place]) 10/21/2018   46mm by echo 08/2018  . Diverticulosis   . Erectile dysfunction 08/16/2011  . ESSENTIAL HYPERTENSION 07/19/2010   Qualifier: Diagnosis of  By: Anitra Lauth M.D., Brien Few   . GAD (generalized anxiety disorder)    managed by Dr. Toy Care with prozac 20mg  qd  . Gout    Usually Right great toe; colchicine helps well  . Habitual alcohol use   . Hx of colonoscopy 2004; 2014   Initial was normal; 2014-polypectomy and diverticulosis.  Recall 2019.  Marland Kitchen Hyperlipidemia 10/23/2012  . Low back strain 10/24/2013  . Obesity, Class I, BMI 30-34.9   . Thyroid nodule 04/23/2013  . TOBACCO ABUSE 08/13/2010    Past Surgical History:  Procedure Laterality Date  . CARDIAC CATHETERIZATION  10/12/10   diagnostic only: 1 vessel CAD with TIMI 3 flow, EF 40-45%.  Mild AS.  Medical mgmt of CAD.  Marland Kitchen COLONOSCOPY W/ POLYPECTOMY  10/06/12   Diverticulosis and tubular adenoma w/out high grade dysplasia.  Repeat 5 yrs (Dr. Deatra Ina).  . CORONARY ATHERECTOMY N/A 01/08/2019   100% chronic RCA occlusion impossible to clear.  Med mgmt. Procedure: CORONARY ATHERECTOMY;  Surgeon: Sherren Mocha, MD;  Location: [facility];  Service: Cardiovascular;  Laterality: N/A;  . RIGHT HEART CATH AND CORONARY ANGIOGRAPHY N/A 12/24/2018   95% RCA occlusion, 75% obtuse marginal occlusion (3V dz). Procedure: RIGHT HEART CATH AND CORONARY ANGIOGRAPHY;  Surgeon: Lorretta Harp, MD;  Location: [facility];  Service: Cardiovascular;  Laterality: N/A;  . TRANSTHORACIC ECHOCARDIOGRAM  09/2010; 07/2017; 08/2018   2012:  EF 40-45%, global hypokinesis, mild AS.  07/2017: no interval change except mild/mod aortic regurg.  08/2018 EF 20-25%, global LV hypokinesis, diastolic dyfxn, biatrial enlargemt, mod AR, aortic root 3.8 cm.    Family History  Problem Relation Age of Onset  . Congenital heart disease Brother        cardiomyopathy  . Alzheimer's disease Mother   . COPD Father   . Heart failure Father        congestive  .  Colon cancer Neg Hx   . Esophageal cancer Neg Hx   . Stomach cancer Neg Hx   . Rectal cancer Neg Hx     SOCIAL HX:  Social History   Socioeconomic History  . Marital status: Married    Spouse name: Thayer Headings  . Number of children: 2  . Years of education: Not on file  . Highest education level: Not on file  Occupational History  . Occupation: Engineer, structural: [employer]  Social Needs  . Financial resource strain: Not on file  . Food insecurity    Worry: Not on file    Inability: Not on file  . Transportation needs    Medical: Not on file    Non-medical: Not on file  Tobacco Use  . Smoking status: Never Smoker  . Smokeless tobacco: Current User    Types: Chew  . Tobacco comment: chews tobacco  Substance and Sexual Activity  . Alcohol use: Yes    Alcohol/week: 28.0 standard drinks    Types: 28 Cans of beer per week  . Drug use: No  . Sexual activity: Yes  Lifestyle  . Physical activity    Days per week: Not on file    Minutes per session: Not on file  . Stress: Not on file  Relationships  . Social Herbalist on phone: Not on file    Gets together: Not on file    Attends religious service: Not on file    Active member of club or organization: Not on file    Attends meetings of clubs or organizations: Not on file    Relationship status: Not on file  Other Topics Concern  . Not on file  Social History Narrative   Delivery driver.  Married, two children.   Long hx of smoking but quit after 2010.      Current Outpatient Medications:  .  acetaminophen (TYLENOL) 500 MG tablet, Take 500-1,000 mg by mouth every 6 (six) hours as needed (for pain.)., Disp: , Rfl:  .  aspirin 81 MG tablet, Take 81 mg by mouth daily.  , Disp: , Rfl:  .  atorvastatin (LIPITOR) 40 MG tablet, Take 1 tablet (40 mg total) by mouth daily., Disp: 90 tablet, Rfl: 3 .  carvedilol (COREG) 25 MG tablet, Take 1 tablet (25 mg total) by mouth 2 (two) times daily., Disp: 180  tablet, Rfl: 3 .  colchicine 0.6 MG tablet, TAKE 2 TABLETS AT ONSET OF GOUT PAIN, REPEAT IN 1 HOUR, THEN 1 TABLETTWICE/DAY TIL RESOLVED, Disp: 30 tablet, Rfl: 5 .  FLUoxetine (PROZAC) 20 MG capsule, Take 20 mg by mouth daily. , Disp: , Rfl:  .  sacubitril-valsartan (ENTRESTO) 49-51 MG, Take 1 tablet by mouth 2 (two) times daily., Disp: 60 tablet, Rfl: 1  EXAM:  VITALS per patient if applicable: There were no vitals taken for this visit.  GENERAL: alert, oriented, appears well and in no acute distress  HEENT: atraumatic, conjunttiva clear, no obvious abnormalities on inspection of external nose and ears  NECK: normal movements of the head and neck  LUNGS: on inspection no signs of respiratory distress, breathing rate appears normal, no obvious gross SOB, gasping or wheezing  CV: no obvious cyanosis  MS: moves all visible extremities without noticeable abnormality  PSYCH/NEURO: pleasant and cooperative, no obvious depression or anxiety, speech and thought processing grossly intact  LABS: none today  Lab Results  Component Value Date   LABURIC 7.2 10/22/2013    Lab Results  Component Value Date   TSH 2.350 12/21/2018   Lab Results  Component Value Date   WBC 4.1 01/06/2019   HGB 13.3 01/06/2019   HCT 39.0 01/06/2019   MCV 94 01/06/2019   PLT 210 01/06/2019   Lab Results  Component Value Date   CREATININE 1.11 01/28/2019   BUN 11 01/28/2019   NA 141 01/28/2019   K 4.4 01/28/2019   CL 105 01/28/2019   CO2 20 01/28/2019   Lab Results  Component Value Date   ALT 22 12/21/2018   AST 22 12/21/2018   ALKPHOS 64 12/21/2018   BILITOT 0.9 12/21/2018   Lab Results  Component Value Date   CHOL 152 12/21/2018   Lab Results  Component Value Date   HDL 73 12/21/2018   Lab Results  Component Value Date   LDLCALC 63 12/21/2018   Lab Results  Component Value Date   TRIG 80 12/21/2018   Lab Results  Component Value Date   CHOLHDL 2.1 12/21/2018   Lab Results   Component Value Date   PSA 2.54 08/01/2017   PSA 1.42 12/08/2015   PSA 1.59 07/22/2014    ASSESSMENT AND PLAN:  Discussed the following assessment and plan:  1) HTN: The current medical regimen is effective;  continue present plan and medications. Lytes/cr normal 1 wk ago.  2) HLD: tolerating statin.   Lipid panel at goal 12/2018.  3) Mixed ischemic/nonischemic CM: RCA 100% occlusion, chronic, unalterable as of 12/2018 atherectomy attempt by cardiologist.  He is now stable on entrestoand coreg.  No diuretic needed.    I discussed the assessment and treatment plan with the patient. The patient was provided an opportunity to ask questions and all were answered. The patient agreed with the plan and demonstrated an understanding of the instructions.   The patient was advised to call back or seek an in-person evaluation if the symptoms worsen or if the condition fails to improve as anticipated.  F/u: 6 mo cpe  Signed:  Crissie Sickles, MD           02/04/2019

## 2019-03-14 ENCOUNTER — Encounter: Payer: Self-pay | Admitting: Physician Assistant

## 2019-03-14 NOTE — Progress Notes (Signed)
Virtual Visit via Video Note   This visit type was conducted due to national recommendations for restrictions regarding the COVID-19 Pandemic (e.g. social distancing) in an effort to limit this patient's exposure and mitigate transmission in our community.  Due to his co-morbid illnesses, this patient is at least at moderate risk for complications without adequate follow up.  This format is felt to be most appropriate for this patient at this time.  All issues noted in this document were discussed and addressed.  A limited physical exam was performed with this format.  Per previous encounter, the patient agreed to consent and proceed with telehealth for [name].  Date:  03/15/2019   ID:  Anthony Daugherty, DOB [birth year], MRN [id number]  Patient Location: Home Provider Location: Home  PCP:  Anthony Sou, MD  Cardiologist:  Anthony Him, MD  Electrophysiologist:  None   Evaluation Performed:  Follow-Up Visit  Chief Complaint:  F/u BP, CHF  History of Present Illness:    Anthony Daugherty is a 73 y.o. male with currently mixed ICM/NICM with recent worsening LV function, CAD as outlined below, LBBB, habitual ETOH use, mild aortic stenosis and moderate AI with dilated aortic root by echo 08/2018, HTN, HLD, mild pulmonary HTN, GAD, gout, diverticulosis, thyroid nodule, and chew tobacco use who presents for CHF med titration visit virtually.  He has a prior history of NICM with EF 40-45% by cath and echo in 2012 and CAD with 80% OM lesion medically managed. In 07/2017 his PCP repeated echo and EF remained stable. However, routine repeat echo 08/2018 showed further decline in EF to 39-03% with diastolic dysfunction, mildly enlarged RV, mildly enlarged LV, severe LAE/RAE, mild thickening of MV, mild aortic stenosis, moderate aortic insufficiency, mild dilation of aortic root, mild pulm HTN. He was subsequently referred to cardiology. Anthony Daugherty saw Daugherty new patient via telemedicine  10/22/18 and recommended to change Bystolic to carvedilol, with plans to eventually consider transition to Entresto/spironolactone. She increased his atorvastatin to 32m daily. Pre-cath f/u was arranged and EKG 12/21/18 showed new LBBB of unclear chronicity (not present 2012). The patient himself was surprised to learn he had a heart problem because he had no symptoms to suggest this. He underwent cath 12/24/18 showing 95% prox RCA, 75% OM1, 50% prox-mid LAD. It was felt difficult to determine whether these were contributing to his declining EF. He was set up for coronary atherectomy by Dr. CBurt Knack6/26/20 which was unsuccessful due to inability to cross the lesion. Medical therapy was recommended. Last labs 12/2018 showed K 4.0 and Cr 1.06, normal CBC, no M spike on UPEP, LDL 63, TSH and ferritin WNL. His cardiomyopathy is felt to be a mixed ICM/NICM, with possible contribution from alcohol. We have been working on CHF med titration; last change was addition of Entresto to his regimen. His HCTZ was stopped due to the diuretic component of Entresto, with a possible eye towards adding spironolactone in the future. Last labs 01/28/19 showed K 4.4, Cr 1.11. The plan was for repeat BMET 7/23 to increase Entresto further but do not see this occurred.   He is seen back virtually with wife Anthony Daugherty He's doing well without any symptoms of chest pain, dyspnea on exertion, dyspnea at rest, edema, orthopnea or weight gain. He is following BP regularly. He had some readings last week in the 1009-233systolic range, but more recently they were 108/49 (8/27 at 1:30am while watching TV), 99/45 (8/29 at 5:45pm), and today as below.  He has no dizziness and is tolerating meds well. HR running 60s. The patient does not have symptoms concerning for COVID-19 infection (fever, chills, cough, or new shortness of breath). Wife expresses concern that patient's sons are not really adhering to covid precautions so we discussed importance of social  distancing, masking, and so forth. He has no hx of carpal tunnel or spinal stenosis. He has cut down ETOH to 1-2 drinks per day but we discussed ultimate goal of cessation.    Past Medical History:  Diagnosis Date   Aortic regurgitation    Mild-mod on echo 07/2017.  Moderate 08/2018.  Aortic root 3.8 cm (down from 4.65m 2019).   Aortic stenosis, mild 10/12/2010   2012 cath and echo.  Same on echo 08/2018.   Arthritis    L-Spine   CARDIAC MURMUR 073/42/8768 21157  Systolic and diastolic as of 06/6202   Cardiomyopathy ([place] 10/12/2010   Global hypokinesis, EF 40-45% by cath and echo (2012 echo and 2019 echo).  Feb 2020 EF 20-25%. - mixed ICM/NICM (alcohol and CAD)   Coronary artery disease 11/16/2010   a. 2012 - 80% OM lesion. b. Cath 12/2018 - showing 95% prox RCA, 75% OM1, 50% prox-mid LAD. Unsuccessful PCI to 100% chronically occluded RCA.   Dilated aortic root ([place] 10/21/2018   337mby echo 08/2018   Diverticulosis    Erectile dysfunction 08/16/2011   ESSENTIAL HYPERTENSION 07/19/2010   Qualifier: Diagnosis of  By: Anthony Daugherty.D., PhBrien Daugherty  GAD (generalized anxiety disorder)    managed by Dr. KaToy Daugherty prozac 2080md   Gout    Usually Right great toe; colchicine helps well   Habitual alcohol use    Hx of colonoscopy 2004; 2014   Initial was normal; 2014-polypectomy and diverticulosis.  Recall 2019.   Hyperlipidemia 10/23/2012   LBBB (left bundle branch block)    Low back strain 10/24/2013   Obesity, Class I, BMI 30-34.9    Thyroid nodule 04/23/2013   TOBACCO ABUSE 08/13/2010   Past Surgical History:  Procedure Laterality Date   CARDIAC CATHETERIZATION  10/12/10   diagnostic only: 1 vessel CAD with TIMI 3 flow, EF 40-45%.  Mild AS.  Medical mgmt of CAD.   COLONOSCOPY W/ POLYPECTOMY  10/06/12   Diverticulosis and tubular adenoma w/out high grade dysplasia.  Repeat 5 yrs (Dr. KapDeatra Ina  CORONARY ATHERECTOMY N/A 01/08/2019   100% chronic RCA occlusion impossible  to clear.  Med mgmt. Procedure: CORONARY ATHERECTOMY;  Surgeon: CooSherren MochaD;  Location: [facility];  Service: Cardiovascular;  Laterality: N/A;   RIGHT HEART CATH AND CORONARY ANGIOGRAPHY N/A 12/24/2018   95% RCA occlusion, 75% obtuse marginal occlusion (3V dz). Procedure: RIGHT HEART CATH AND CORONARY ANGIOGRAPHY;  Surgeon: BerLorretta HarpD;  Location: [facility];  Service: Cardiovascular;  Laterality: N/A;   TRANSTHORACIC ECHOCARDIOGRAM  09/2010; 07/2017; 08/2018   2012:  EF 40-45%, global hypokinesis, mild AS.  07/2017: no interval change except mild/mod aortic regurg.  08/2018 EF 20-25%, global LV hypokinesis, diastolic dyfxn, biatrial enlargemt, mod AR, aortic root 3.8 cm.     Current Meds  Medication Sig   acetaminophen (TYLENOL) 500 MG tablet Take 500-1,000 mg by mouth every 6 (six) hours as needed (for pain.).   aspirin 81 MG tablet Take 81 mg by mouth daily.     atorvastatin (LIPITOR) 40 MG tablet Take 1 tablet (40 mg total) by mouth daily.   carvedilol (COREG) 25 MG tablet  Take 1 tablet (25 mg total) by mouth 2 (two) times daily.   colchicine 0.6 MG tablet TAKE 2 TABLETS AT ONSET OF GOUT PAIN, REPEAT IN 1 HOUR, THEN 1 TABLETTWICE/DAY TIL RESOLVED   FLUoxetine (PROZAC) 20 MG capsule Take 20 mg by mouth daily.    sacubitril-valsartan (ENTRESTO) 49-51 MG Take 1 tablet by mouth 2 (two) times daily.     Allergies:   Penicillins   Social History   Tobacco Use   Smoking status: Never Smoker   Smokeless tobacco: Current User    Types: Chew   Tobacco comment: chews tobacco  Substance Use Topics   Alcohol use: Yes    Alcohol/week: 28.0 standard drinks    Types: 28 Cans of beer per week   Drug use: No     Family Hx: The patient's family history includes Alzheimer's disease in his mother; COPD in his father; Congenital heart disease in his brother; Heart failure in his father. There is no history of Colon cancer, Esophageal cancer, Stomach  cancer, or Rectal cancer.  ROS:   Please see the history of present illness.    All other systems reviewed and are negative.   Prior CV studies:    Most recent pertinent cardiac studies are outlined above.  Labs/Other Tests and Data Reviewed:    EKG:  An ECG dated 12/21/18 was personally reviewed today and demonstrated:  NSR 1st degree AVB, new LBBB compared to 2012 EKG, otherwise nonspecific changes related to bundle.  Recent Labs: 12/21/2018: ALT 22; TSH 2.350 01/06/2019: Hemoglobin 13.3; Platelets 210 01/28/2019: BUN 11; Creatinine, Ser 1.11; Potassium 4.4; Sodium 141   Recent Lipid Panel Lab Results  Component Value Date/Time   CHOL 152 12/21/2018 02:25 PM   TRIG 80 12/21/2018 02:25 PM   TRIG 105 05/10/2008   HDL 73 12/21/2018 02:25 PM   CHOLHDL 2.1 12/21/2018 02:25 PM   CHOLHDL 3 08/03/2018 10:25 AM   LDLCALC 63 12/21/2018 02:25 PM   LDLCALC 101 05/10/2008    Wt Readings from Last 3 Encounters:  03/15/19 172 lb (78 kg)  01/28/19 173 lb (78.5 kg)  01/21/19 174 lb (78.9 kg)     Objective:    Vital Signs:  BP (!) 118/56    Pulse 64    Ht _0  (1.651 m)    Wt 172 lb (78 kg)    BMI 28.62 kg/m    VS reviewed. General - pleasant WM in no acute distress HEENT - NCAT, EOM intact Pulm - No labored breathing, no coughing during visit, no audible wheezing, speaking in full sentences Neuro - A+Ox3, no slurred speech, answers questions appropriately Psych - Pleasant affect     ASSESSMENT & PLAN:    1. Cardiomyopathy/chronic systolic CHF - clinically doing well. I had planned to titrate Entresto but based on the BPs over the last Daugherty days, he's had intermittent low readings so will continue current regimen. Check f/u BMET with [facility]. He will follow BP over this next week and submit trend for MyChart given occasional low readings. He may have met maximum med titration at this time so will recheck echocardiogram mid-October which is 3 months after last med addition. If  EF has not improved, can consider workup for infiltrative disease although he does not really have any stigmata of this, and may have alternative etiology of CAD + ETOH. Would also need to consider CRT. Have already reviewed sodium/fluid restriction with patient in prior visits which he understands. 2. CAD - medical  therapy recommended. Continue ASA, statin, BB. No angina. 3. LBBB - as above, if EF remains low, consider referral to EP for CRT-D. 4. Hyperlipidemia - check CMET/lipids when he comes for echo in 04/2019. 5. Aortic valve disorder with mild aortic stenosis and moderate AI with dilated aortic root by echo 08/2018 - we will be reassessing this anyway by echocardiogram in October.   COVID-19 Education: The signs and symptoms of COVID-19 were discussed with the patient and how to seek care for testing (follow up with PCP or arrange E-visit).  The importance of social distancing was discussed today.  Time:   Today, I have spent 18 minutes with the patient with telehealth technology discussing the above problems.     Medication Adjustments/Labs and Tests Ordered: Current medicines are reviewed at length with the patient today.  Concerns regarding medicines are outlined above.   Disposition:  Follow up in October with me virtually after echocardiogram.  Signed, Charlie Pitter, PA-C  03/15/2019 10:29 AM    [place]

## 2019-03-15 ENCOUNTER — Telehealth: Payer: Self-pay | Admitting: *Deleted

## 2019-03-15 ENCOUNTER — Encounter: Payer: Self-pay | Admitting: Physician Assistant

## 2019-03-15 ENCOUNTER — Telehealth: Payer: Self-pay

## 2019-03-15 ENCOUNTER — Other Ambulatory Visit: Payer: Self-pay

## 2019-03-15 ENCOUNTER — Telehealth (INDEPENDENT_AMBULATORY_CARE_PROVIDER_SITE_OTHER): Payer: Medicare Other | Admitting: Physician Assistant

## 2019-03-15 VITALS — BP 118/56 | HR 64 | Ht 65.0 in | Wt 172.0 lb

## 2019-03-15 DIAGNOSIS — I447 Left bundle-branch block, unspecified: Secondary | ICD-10-CM

## 2019-03-15 DIAGNOSIS — E785 Hyperlipidemia, unspecified: Secondary | ICD-10-CM

## 2019-03-15 DIAGNOSIS — I359 Nonrheumatic aortic valve disorder, unspecified: Secondary | ICD-10-CM

## 2019-03-15 DIAGNOSIS — I5022 Chronic systolic (congestive) heart failure: Secondary | ICD-10-CM

## 2019-03-15 DIAGNOSIS — I2510 Atherosclerotic heart disease of native coronary artery without angina pectoris: Secondary | ICD-10-CM

## 2019-03-15 NOTE — Telephone Encounter (Signed)
YOUR CARDIOLOGY TEAM HAS ARRANGED FOR AN E-VISIT FOR YOUR APPOINTMENT - PLEASE REVIEW IMPORTANT INFORMATION BELOW SEVERAL DAYS PRIOR TO YOUR APPOINTMENT  Due to the recent COVID-19 pandemic, we are transitioning in-person office visits to tele-medicine visits in an effort to decrease unnecessary exposure to our patients, their families, and staff. These visits are billed to your insurance just like a normal visit is. We also encourage you to sign up for MyChart if you have not already done so. You will need a smartphone if possible. For patients that do not have this, we can still complete the visit using a regular telephone but do prefer a smartphone to enable video when possible. You may have a family member that lives with you that can help. If possible, we also ask that you have a blood pressure cuff and scale at home to measure your blood pressure, heart rate and weight prior to your scheduled appointment. Patients with clinical needs that need an in-person evaluation and testing will still be able to come to the office if absolutely necessary. If you have any questions, feel free to call our office.     YOUR PROVIDER WILL BE USING THE FOLLOWING PLATFORM TO COMPLETE YOUR VISIT: Doximity  . IF USING MYCHART - How to Download the MyChart App to Your SmartPhone   - If Apple, go to App Store and type in MyChart in the search bar and download the app. If Android, ask patient to go to Google Play Store and type in MyChart in the search bar and download the app. The app is free but as with any other app downloads, your phone may require you to verify saved payment information or Apple/Android password.  - You will need to then log into the app with your MyChart username and password, and select [facility] as your healthcare provider to link the account.  - When it is time for your visit, go to the MyChart app, find appointments, and click Begin Video Visit. Be sure to Select Allow for your device to  access the Microphone and Camera for your visit. You will then be connected, and your provider will be with you shortly.  **If you have any issues connecting or need assistance, please contact MyChart service desk [phone] ([phone])**  **If using a computer, in order to ensure the best quality for your visit, you will need to use either of the following Internet Browsers: Google Chrome or Microsoft Edge**  . IF USING DOXIMITY or DOXY.ME - The staff will give you instructions on receiving your link to join the meeting the day of your visit.      2-3 DAYS BEFORE YOUR APPOINTMENT  You will receive a telephone call from one of our HeartCare team members - your caller ID may say "Unknown caller." If this is a video visit, we will walk you through how to get the video launched on your phone. We will remind you check your blood pressure, heart rate and weight prior to your scheduled appointment. If you have an Apple Watch or Kardia, please upload any pertinent ECG strips the day before or morning of your appointment to MyChart. Our staff will also make sure you have reviewed the consent and agree to move forward with your scheduled tele-health visit.     THE DAY OF YOUR APPOINTMENT  Approximately 15 minutes prior to your scheduled appointment, you will receive a telephone call from one of HeartCare team - your caller ID may say "Unknown caller."    Our staff will confirm medications, vital signs for the day and any symptoms you may be experiencing. Please have this information available prior to the time of visit start. It may also be helpful for you to have a pad of paper and pen handy for any instructions given during your visit. They will also walk you through joining the smartphone meeting if this is a video visit.    CONSENT FOR TELE-HEALTH VISIT - PLEASE REVIEW  I hereby voluntarily request, consent and authorize [facility] and its employed or contracted physicians, physician  assistants, nurse practitioners or other licensed health care professionals (the Practitioner), to provide me with telemedicine health care services (the "Services") as deemed necessary by the treating Practitioner. I acknowledge and consent to receive the Services by the Practitioner via telemedicine. I understand that the telemedicine visit will involve communicating with the Practitioner through live audiovisual communication technology and the disclosure of certain medical information by electronic transmission. I acknowledge that I have been given the opportunity to request an in-person assessment or other available alternative prior to the telemedicine visit and am voluntarily participating in the telemedicine visit.  I understand that I have the right to withhold or withdraw my consent to the use of telemedicine in the course of my care at any time, without affecting my right to future care or treatment, and that the Practitioner or I may terminate the telemedicine visit at any time. I understand that I have the right to inspect all information obtained and/or recorded in the course of the telemedicine visit and may receive copies of available information for a reasonable fee.  I understand that some of the potential risks of receiving the Services via telemedicine include:  . Delay or interruption in medical evaluation due to technological equipment failure or disruption; . Information transmitted may not be sufficient (e.g. poor resolution of images) to allow for appropriate medical decision making by the Practitioner; and/or  . In rare instances, security protocols could fail, causing a breach of personal health information.  Furthermore, I acknowledge that it is my responsibility to provide information about my medical history, conditions and care that is complete and accurate to the best of my ability. I acknowledge that Practitioner's advice, recommendations, and/or decision may be based on  factors not within their control, such as incomplete or inaccurate data provided by me or distortions of diagnostic images or specimens that may result from electronic transmissions. I understand that the practice of medicine is not an exact science and that Practitioner makes no warranties or guarantees regarding treatment outcomes. I acknowledge that I will receive a copy of this consent concurrently upon execution via email to the email address I last provided but may also request a printed copy by calling the office of [facility].    I understand that my insurance will be billed for this visit.   I have read or had this consent read to me. . I understand the contents of this consent, which adequately explains the benefits and risks of the Services being provided via telemedicine.  . I have been provided ample opportunity to ask questions regarding this consent and the Services and have had my questions answered to my satisfaction. . I give my informed consent for the services to be provided through the use of telemedicine in my medical care  By participating in this telemedicine visit I agree to the above.  

## 2019-03-15 NOTE — Patient Instructions (Addendum)
Medication Instructions:  Your physician recommends that you continue on your current medications as directed. Please refer to the Current Medication list given to you today.  If you need a refill on your cardiac medications before your next appointment, please call your pharmacy.   Lab work: 04/26/2019 AT TIME OF YOUR ECHOCARDIOGRAM:  WE WILL DRAW FASTING LIPID (CHOLESTEROL) MAKE SURE YOU COME FASTING, NOTHING TO EAT OR DRINK AFTER MIDNIGHT THE NIGHT BEFORE  We will have [facility] will come out to draw a BMET.  They will call you to arrange this.   If you have labs (blood work) drawn today and your tests are completely normal, you will receive your results only by: Marland Kitchen MyChart Message (if you have MyChart) OR . A paper copy in the mail If you have any lab test that is abnormal or we need to change your treatment, we will call you to review the results.  Testing/Procedures: Your physician has requested that you have an echocardiogram 04/26/2019 at 8:20.  MAKE SURE YOU ARRIVE BY 8:05 FOR THIS TEST.  Echocardiography is a painless test that uses sound waves to create images of your heart. It provides your doctor with information about the size and shape of your heart and how well your heart's chambers and valves are working. This procedure takes approximately one hour. There are no restrictions for this procedure.    Follow-Up: At [facility], you and your health needs are our priority.  As part of our continuing mission to provide you with exceptional heart care, we have created designated Provider Care Teams.  These Care Teams include your primary Cardiologist (physician) and Advanced Practice Providers (APPs -  Physician Assistants and Nurse Practitioners) who all work together to provide you with the care you need, when you need it. . YOU ARE SCHEDULED FOR ANOTHER VIRTUAL VIDEO APPOINTMENT WITH DAYNA DUNN, PA-C, 04/30/2019 AT 8:00  Any Other Special Instructions Will Be Listed Below  (If Applicable).  Echocardiogram An echocardiogram is a procedure that uses painless sound waves (ultrasound) to produce an image of the heart. Images from an echocardiogram can provide important information about:  Signs of coronary artery disease (CAD).  Aneurysm detection. An aneurysm is a weak or damaged part of an artery wall that bulges out from the normal force of blood pumping through the body.  Heart size and shape. Changes in the size or shape of the heart can be associated with certain conditions, including heart failure, aneurysm, and CAD.  Heart muscle function.  Heart valve function.  Signs of a past heart attack.  Fluid buildup around the heart.  Thickening of the heart muscle.  A tumor or infectious growth around the heart valves. Tell a health care provider about:  Any allergies you have.  All medicines you are taking, including vitamins, herbs, eye drops, creams, and over-the-counter medicines.  Any blood disorders you have.  Any surgeries you have had.  Any medical conditions you have.  Whether you are pregnant or may be pregnant. What are the risks? Generally, this is a safe procedure. However, problems may occur, including:  Allergic reaction to dye (contrast) that may be used during the procedure. What happens before the procedure? No specific preparation is needed. You may eat and drink normally. What happens during the procedure?   An IV tube may be inserted into one of your veins.  You may receive contrast through this tube. A contrast is an injection that improves the quality of the pictures from  your heart.  A gel will be applied to your chest.  A wand-like tool (transducer) will be moved over your chest. The gel will help to transmit the sound waves from the transducer.  The sound waves will harmlessly bounce off of your heart to allow the heart images to be captured in real-time motion. The images will be recorded on a computer. The  procedure may vary among health care providers and hospitals. What happens after the procedure?  You may return to your normal, everyday life, including diet, activities, and medicines, unless your health care provider tells you not to do that. Summary  An echocardiogram is a procedure that uses painless sound waves (ultrasound) to produce an image of the heart.  Images from an echocardiogram can provide important information about the size and shape of your heart, heart muscle function, heart valve function, and fluid buildup around your heart.  You do not need to do anything to prepare before this procedure. You may eat and drink normally.  After the echocardiogram is completed, you may return to your normal, everyday life, unless your health care provider tells you not to do that. This information is not intended to replace advice given to you by your health care provider. Make sure you discuss any questions you have with your health care provider. Document Released: 06/28/2000 Document Revised: 10/22/2018 Document Reviewed: 08/03/2016 Elsevier Patient Education  2020 [employer].

## 2019-03-15 NOTE — Addendum Note (Signed)
Addended by: Gaetano Net on: 03/15/2019 10:59 AM   Modules accepted: Orders

## 2019-03-15 NOTE — Telephone Encounter (Signed)
Virtual Visit Pre-Appointment Phone Call  "(Name), I am calling you today to discuss your upcoming appointment. We are currently trying to limit exposure to the virus that causes COVID-19 by seeing patients at home rather than in the office."  1. "What is the BEST phone number to call the day of the visit?" - include this in appointment notes  2. "Do you have or have access to (through a family member/friend) a smartphone with video capability that we can use for your visit?" a. If yes - list this number in appt notes as "cell" (if different from BEST phone #) and list the appointment type as a VIDEO visit in appointment notes b. If no - list the appointment type as a PHONE visit in appointment notes  3. Confirm consent - "In the setting of the current Covid19 crisis, you are scheduled for a (phone or video) visit with your provider on (date) at (time).  Just as we do with many in-office visits, in order for you to participate in this visit, we must obtain consent.  If you'd like, I can send this to your mychart (if signed up) or email for you to review.  Otherwise, I can obtain your verbal consent now.  All virtual visits are billed to your insurance company just like a normal visit would be.  By agreeing to a virtual visit, we'd like you to understand that the technology does not allow for your provider to perform an examination, and thus may limit your provider's ability to fully assess your condition. If your provider identifies any concerns that need to be evaluated in person, we will make arrangements to do so.  Finally, though the technology is pretty good, we cannot assure that it will always work on either your or our end, and in the setting of a video visit, we may have to convert it to a phone-only visit.  In either situation, we cannot ensure that we have a secure connection.  Are you willing to proceed?" STAFF: Did the patient verbally acknowledge consent to telehealth visit? Document  YES--patient agrees to video  4. Advise patient to be prepared - "Two hours prior to your appointment, go ahead and check your blood pressure, pulse, oxygen saturation, and your weight (if you have the equipment to check those) and write them all down. When your visit starts, your provider will ask you for this information. If you have an Apple Watch or Kardia device, please plan to have heart rate information ready on the day of your appointment. Please have a pen and paper handy nearby the day of the visit as well."  5. Give patient instructions for MyChart download to smartphone OR Doximity/Doxy.me as below if video visit (depending on what platform provider is using)  6. Inform patient they will receive a phone call 15 minutes prior to their appointment time (may be from unknown caller ID) so they should be prepared to answer    TELEPHONE CALL NOTE  JAHAZIAH CHEESEMAN has been deemed a candidate for a follow-up tele-health visit to limit community exposure during the Covid-19 pandemic. I spoke with the patient via phone to ensure availability of phone/video source, confirm preferred email & phone number, and discuss instructions and expectations.  I reminded CAISON VERNIER to be prepared with any vital sign and/or heart rhythm information that could potentially be obtained via home monitoring, at the time of his visit. I reminded KAZ RISHER to expect a phone call prior  to his visit.  Jeremy Johann, [place] 03/15/2019 10:06 AM   INSTRUCTIONS FOR DOWNLOADING THE MYCHART APP TO SMARTPHONE  - The patient must first make sure to have activated MyChart and know their login information - If Apple, go to [employer] and type in MyChart in the search bar and download the app. If Android, ask patient to go to [employer] and type in [place] in the search bar and download the app. The app is free but as with any other app downloads, their phone may require them to verify saved  payment information or Apple/Android password.  - The patient will need to then log into the app with their MyChart username and password, and select [facility] as their healthcare provider to link the account. When it is time for your visit, go to the MyChart app, find appointments, and click Begin Video Visit. Be sure to Select Allow for your device to access the Microphone and Camera for your visit. You will then be connected, and your provider will be with you shortly.  **If they have any issues connecting, or need assistance please contact MyChart service desk [phone])**  **If using a computer, in order to ensure the best quality for their visit they will need to use either of the following Internet Browsers: [employer], or Google Chrome**  IF USING DOXIMITY or DOXY.ME - The patient will receive a link just prior to their visit by text.     FULL LENGTH CONSENT FOR TELE-HEALTH VISIT   I hereby voluntarily request, consent and authorize [facility] and its employed or contracted physicians, physician assistants, nurse practitioners or other licensed health care professionals (the Practitioner), to provide me with telemedicine health care services (the "Services") as deemed necessary by the treating Practitioner. I acknowledge and consent to receive the Services by the Practitioner via telemedicine. I understand that the telemedicine visit will involve communicating with the Practitioner through live audiovisual communication technology and the disclosure of certain medical information by electronic transmission. I acknowledge that I have been given the opportunity to request an in-person assessment or other available alternative prior to the telemedicine visit and am voluntarily participating in the telemedicine visit.  I understand that I have the right to withhold or withdraw my consent to the use of telemedicine in the course of my care at any time, without affecting  my right to future care or treatment, and that the Practitioner or I may terminate the telemedicine visit at any time. I understand that I have the right to inspect all information obtained and/or recorded in the course of the telemedicine visit and may receive copies of available information for a reasonable fee.  I understand that some of the potential risks of receiving the Services via telemedicine include:  Marland Kitchen Delay or interruption in medical evaluation due to technological equipment failure or disruption; . Information transmitted may not be sufficient (e.g. poor resolution of images) to allow for appropriate medical decision making by the Practitioner; and/or  . In rare instances, security protocols could fail, causing a breach of personal health information.  Furthermore, I acknowledge that it is my responsibility to provide information about my medical history, conditions and care that is complete and accurate to the best of my ability. I acknowledge that Practitioner's advice, recommendations, and/or decision may be based on factors not within their control, such as incomplete or inaccurate data provided by me or distortions of diagnostic images or specimens that may result from electronic  transmissions. I understand that the practice of medicine is not an exact science and that Practitioner makes no warranties or guarantees regarding treatment outcomes. I acknowledge that I will receive a copy of this consent concurrently upon execution via email to the email address I last provided but may also request a printed copy by calling the office of [facility].    I understand that my insurance will be billed for this visit.   I have read or had this consent read to me. . I understand the contents of this consent, which adequately explains the benefits and risks of the Services being provided via telemedicine.  . I have been provided ample opportunity to ask questions regarding this consent and the  Services and have had my questions answered to my satisfaction. . I give my informed consent for the services to be provided through the use of telemedicine in my medical care  By participating in this telemedicine visit I agree to the above.

## 2019-03-15 NOTE — Telephone Encounter (Signed)
[facility] Visit Follow Up Request   Date of Request ([place]):  March 15, 2019  Requesting Provider:  Melina Copa, PA-C    Agency Requested:    [facility] Contact:  [place] [address] [place], [place] [address] Phone #:  [phone] Fax #:  [phone]  Patient Demographic Information: Name:  Anthony Daugherty Age:  73 y.o.   DOB:  1946/07/14  MRN:  [id number]   Home visit progress note(s), lab results, telemetry strips, etc were reviewed.  Provider Recommendations: FOLLOW UP REQUEST  Follow up home services requested:  Vital Signs (BP, Pulse, O2, Weight)  Labs:  BMET  NUMBER OF VISITS: 1 VISIT AT FIRST AVAILABLE  OUR OFFICE HAD A NOTE THERE WAS TO BE A HOME VISIT THE WEEK OF 02/04/19, THOUGH LOOKS LIKE NO-ONE WENT OUT. IF NURSE DID GO OUT NOT SURE IF WE RECEIVED INFORMATION. LOOKS LIKE REQUEST WAS PLACED ON 02/01/19.

## 2019-03-16 ENCOUNTER — Encounter: Payer: Self-pay | Admitting: Family Medicine

## 2019-03-17 NOTE — Progress Notes (Signed)
Lamont Dowdy     DOB: Apr 02, 1946  Purpose of Visit: Luther Redo, BMET Ordering provider: Melina Copa, PA-C  Medications: Is the patient taking all medications listed on MAR from Epic? Yes, but doesn't use tylenol List any medications that are not being taken correctly: n/a  List any medication refills needed: none  Is the patient able to pick up medications? Yes  Vitals: see VS tab BP:     HR:    Oxygen: Weight:        Physical Exam:  Lung sounds: clear  Heart sounds: regular  Peripheral edema: no  Wounds: no  Location:  Any patient concerns?  None.   ReDS Vest/Clip Reading: n/a  Rhythm Strip: n/a  Is Home Health recommended? No If yes, state reason:   BMET drawn and brought to the [facility] on [address] In [place].  Vanita Ingles, RN 03/17/19

## 2019-03-18 ENCOUNTER — Telehealth: Payer: Self-pay

## 2019-03-18 LAB — BASIC METABOLIC PANEL
BUN/Creatinine Ratio: 11 (ref 10–24)
BUN: 10 mg/dL (ref 8–27)
CO2: 21 mmol/L (ref 20–29)
Calcium: 9.3 mg/dL (ref 8.6–10.2)
Chloride: 104 mmol/L (ref 96–106)
Creatinine, Ser: 0.91 mg/dL (ref 0.76–1.27)
GFR calc Af Amer: 97 mL/min/{1.73_m2} (ref >59)
GFR calc non Af Amer: 84 mL/min/{1.73_m2} (ref >59)
Glucose: 75 mg/dL (ref 65–99)
Potassium: 4.4 mmol/L (ref 3.5–5.2)
Sodium: 140 mmol/L (ref 134–144)

## 2019-03-18 NOTE — Telephone Encounter (Signed)
The patient has been notified of the result and verbalized understanding.  All questions (if any) were answered. Wilma Flavin, RN 03/18/2019 8:54 AM

## 2019-04-06 ENCOUNTER — Other Ambulatory Visit: Payer: Self-pay | Admitting: Physician Assistant

## 2019-04-26 ENCOUNTER — Other Ambulatory Visit: Payer: Medicare Other | Admitting: *Deleted

## 2019-04-26 ENCOUNTER — Ambulatory Visit (HOSPITAL_COMMUNITY): Payer: Medicare Other | Attending: Cardiology

## 2019-04-26 ENCOUNTER — Other Ambulatory Visit: Payer: Self-pay

## 2019-04-26 DIAGNOSIS — I5022 Chronic systolic (congestive) heart failure: Secondary | ICD-10-CM | POA: Diagnosis not present

## 2019-04-26 DIAGNOSIS — I359 Nonrheumatic aortic valve disorder, unspecified: Secondary | ICD-10-CM

## 2019-04-26 DIAGNOSIS — I2510 Atherosclerotic heart disease of native coronary artery without angina pectoris: Secondary | ICD-10-CM

## 2019-04-26 DIAGNOSIS — I447 Left bundle-branch block, unspecified: Secondary | ICD-10-CM

## 2019-04-26 DIAGNOSIS — E785 Hyperlipidemia, unspecified: Secondary | ICD-10-CM

## 2019-04-26 LAB — LIPID PANEL
Chol/HDL Ratio: 2 ratio (ref 0.0–5.0)
Cholesterol, Total: 141 mg/dL (ref 100–199)
HDL: 72 mg/dL (ref >39)
LDL Chol Calc (NIH): 57 mg/dL (ref 0–99)
Triglycerides: 59 mg/dL (ref 0–149)
VLDL Cholesterol Cal: 12 mg/dL (ref 5–40)

## 2019-04-26 LAB — COMPREHENSIVE METABOLIC PANEL
ALT: 9 IU/L (ref 0–44)
AST: 12 IU/L (ref 0–40)
Albumin/Globulin Ratio: 2.2 (ref 1.2–2.2)
Albumin: 4.1 g/dL (ref 3.7–4.7)
Alkaline Phosphatase: 69 IU/L (ref 39–117)
BUN/Creatinine Ratio: 12 (ref 10–24)
BUN: 11 mg/dL (ref 8–27)
Bilirubin Total: 0.7 mg/dL (ref 0.0–1.2)
CO2: 24 mmol/L (ref 20–29)
Calcium: 8.8 mg/dL (ref 8.6–10.2)
Chloride: 103 mmol/L (ref 96–106)
Creatinine, Ser: 0.93 mg/dL (ref 0.76–1.27)
GFR calc Af Amer: 94 mL/min/{1.73_m2} (ref >59)
GFR calc non Af Amer: 82 mL/min/{1.73_m2} (ref >59)
Globulin, Total: 1.9 g/dL (ref 1.5–4.5)
Glucose: 97 mg/dL (ref 65–99)
Potassium: 3.8 mmol/L (ref 3.5–5.2)
Sodium: 139 mmol/L (ref 134–144)
Total Protein: 6 g/dL (ref 6.0–8.5)

## 2019-04-29 ENCOUNTER — Encounter: Payer: Self-pay | Admitting: Physician Assistant

## 2019-04-29 NOTE — Progress Notes (Addendum)
Virtual Visit via Video Note   This visit type was conducted due to national recommendations for restrictions regarding the COVID-19 Pandemic (e.g. social distancing) in an effort to limit this patient's exposure and mitigate transmission in our community.  Due to his co-morbid illnesses, this patient is at least at moderate risk for complications without adequate follow up.  This format is felt to be most appropriate for this patient at this time.  All issues noted in this document were discussed and addressed.  A limited physical exam was performed with this format.  Please refer to the patient's chart for his consent to telehealth for [facility]   Date:  04/30/2019   ID:  Joellyn Haff, DOB 28-Jun-1946, MRN [id number]  Patient Location: Home Provider Location: Home  PCP:  Tammi Sou, MD  Cardiologist:  Fransico Him, MD  Electrophysiologist:  None   Evaluation Performed:  Follow-Up Visit  Chief Complaint: f/u CHF, discuss echo  History of Present Illness:    EMILLIANO Daugherty is a 73 y.o. male with currently mixed ICM/NICM with recent worsening LV function (?ETOH), CAD as outlined below, LBBB, habitual ETOH use, mild aortic stenosis and moderate AI with dilated aortic root + ascending aorta by echo 08/2018, HTN, HLD, pulmonary HTN, GAD, gout, diverticulosis, thyroid nodule, and chew tobacco who presents for f/u of heart failure.  He has a prior history of NICM with EF 40-45% by cath and echo in 2012 and CAD with 80% OM lesion medically managed. In 07/2017 his PCP repeated echo and EF remained stable. However, routine repeat echo 08/2018 showed further decline in EF to 0000000 with diastolic dysfunction, mildly enlarged RV, mildly enlarged LV, severe LAE, moderate RAE, mild aortic stenosis, moderate aortic insufficiency, mild dilation of aortic root, & mild pulm HTN. He was subsequently referred to cardiology. Dr. Radford Pax saw him new patient via telemedicine 10/22/18. The  patient himself was surprised to learn he had a heart problem because he had no symptoms to suggest this. He underwent cath 12/24/18 showing 95% prox RCA, 75% OM1, 50% prox-mid LAD. It was felt difficult to determine whether these were contributing to his declining EF. He was set up for coronary atherectomy by Dr. Burt Knack 01/08/19 which was unsuccessful due to inability to cross the lesion. Medical therapy was recommended. His cardiomyopathy is felt to be a mixed ICM/NICM, with possible contribution from alcohol. We have been working on CHF med titration. He's cut down ETOH consumption. At last telemed OV 03/15/19 he was reporting somewhat labile BPs including readings down to 99/45, so we left cardiac regimen at carvedilol 25mg  BID, Entresto 49/51mg  BID, aspirin, atorvastatin. Repeat echocardiogram 04/26/19 showed LVEF 25-30%, severely increased LV size, no LVH, impaired relaxation of LV diastolic filling with elevated LVEDP, mild-moderate aortic valve sclerosis with mild aortic stenosis, moderate to severe aortic insufficiency (listed moderate one place, severe another), mild dilation of aortic root and ascending aorta, ticuspid AV, moderately elevated PASP. Last labs 04/2019 showed normal CMET Cr 0.93, LDL 57, 12/2018 CBC wnl, no M spike on UPEP, TSH and ferritin WNL.  He returns for follow-up virtually with wife Thayer Headings. He's been doing "just fine" since last visit - as usual, denies any symptoms whatsoever. Denies any exertional dyspnea or fatigue. No CP, LEE, orthopnea, weight gain, palpitations or syncope. He's been following BP and fortunately has not seen any low BP swings recently - the last lowest reading was on 9/27 and was 100/48. More recent readings have been 148/69, 159/63,  and today's value. It's actually been running a little higher recently.  The patient does not have symptoms concerning for COVID-19 infection (fever, chills, cough, or new shortness of breath).    Past Medical History:    Diagnosis Date   Aortic regurgitation    Mild-mod on echo 07/2017.  Moderate 08/2018.  Moderate-severe on echo 04/2019.   Aortic stenosis, mild    Arthritis    L-Spine   Ascending aorta dilatation ([place])    CARDIAC MURMUR [id number]; [id number]   Systolic and diastolic as of 123456.   Cardiomyopathy ([place]) 10/12/2010   Global hypokinesis, EF 40-45% by cath and echo (2012 echo and 2019 echo).  Feb 2020 EF 20-25%. - mixed ICM/NICM (alcohol and CAD)  October 2020 EF 25-30%.   Chronic combined systolic and diastolic CHF (congestive heart failure) ([place])    Coronary artery disease 11/16/2010   a. 2012 - 80% OM lesion. b. Cath 12/2018 - showing 95% prox RCA, 75% OM1, 50% prox-mid LAD. Unsuccessful PCI to 100% chronically occluded RCA summer 2020.   Dilated aortic root ([place]) 10/21/2018   Diverticulosis    Erectile dysfunction 08/16/2011   ESSENTIAL HYPERTENSION 07/19/2010   Qualifier: Diagnosis of  By: Anitra Lauth M.D., Brien Few    GAD (generalized anxiety disorder)    managed by Dr. Toy Care with prozac 20mg  qd   Gout    Usually Right great toe; colchicine helps well   Habitual alcohol use    Hx of colonoscopy 2004; 2014   Initial was normal; 2014-polypectomy and diverticulosis.  Recall 2019.   Hyperlipidemia 10/23/2012   LBBB (left bundle branch block)    Low back strain 10/24/2013   Obesity, Class I, BMI 30-34.9    Thyroid nodule 04/23/2013   TOBACCO ABUSE 08/13/2010   Past Surgical History:  Procedure Laterality Date   CARDIAC CATHETERIZATION  10/12/10   diagnostic only: 1 vessel CAD with TIMI 3 flow, EF 40-45%.  Mild AS.  Medical mgmt of CAD.   COLONOSCOPY W/ POLYPECTOMY  10/06/12   Diverticulosis and tubular adenoma w/out high grade dysplasia.  Repeat 5 yrs (Dr. Deatra Ina).   CORONARY ATHERECTOMY N/A 01/08/2019   100% chronic RCA occlusion impossible to clear.  Med mgmt. Procedure: CORONARY ATHERECTOMY;  Surgeon: Sherren Mocha, MD;  Location: [place] [facility];  Service:  Cardiovascular;  Laterality: N/A;   RIGHT HEART CATH AND CORONARY ANGIOGRAPHY N/A 12/24/2018   95% RCA occlusion, 75% obtuse marginal occlusion (3V dz). Procedure: RIGHT HEART CATH AND CORONARY ANGIOGRAPHY;  Surgeon: Lorretta Harp, MD;  Location: [place] [facility];  Service: Cardiovascular;  Laterality: N/A;   TRANSTHORACIC ECHOCARDIOGRAM  09/2010; 07/2017; 08/2018   2012:  EF 40-45%, global hypokinesis, mild AS.  07/2017: no interval change except mild/mod aortic regurg.  08/2018 EF 20-25%, global LV hypokinesis, diastolic dyfxn, biatrial enlargemt, mod AR, aortic root 3.8 cm.     Current Meds  Medication Sig   acetaminophen (TYLENOL) 500 MG tablet Take 500-1,000 mg by mouth every 6 (six) hours as needed (for pain.).   aspirin 81 MG tablet Take 81 mg by mouth daily.     atorvastatin (LIPITOR) 40 MG tablet Take 1 tablet (40 mg total) by mouth daily.   carvedilol (COREG) 25 MG tablet Take 1 tablet (25 mg total) by mouth 2 (two) times daily.   colchicine 0.6 MG tablet TAKE 2 TABLETS AT ONSET OF GOUT PAIN, REPEAT IN 1 HOUR, THEN 1 TABLETTWICE/DAY TIL RESOLVED   ENTRESTO 49-51 MG TAKE  (1)  TABLET TWICE A DAY.   FLUoxetine (PROZAC) 20 MG capsule Take 20 mg by mouth daily.      Allergies:   Penicillins   Social History   Tobacco Use   Smoking status: Never Smoker   Smokeless tobacco: Current User    Types: Chew   Tobacco comment: chews tobacco  Substance Use Topics   Alcohol use: Yes    Alcohol/week: 28.0 standard drinks    Types: 28 Cans of beer per week   Drug use: No     Family Hx: The patient's family history includes Alzheimer's disease in his mother; COPD in his father; Congenital heart disease in his brother; Heart failure in his father. There is no history of Colon cancer, Esophageal cancer, Stomach cancer, or Rectal cancer.  ROS:   Please see the history of present illness.    All other systems reviewed and are negative.   Prior CV studies:    Most recent  pertinent cardiac studies are outlined above.  Labs/Other Tests and Data Reviewed:    EKG:  An ECG dated6/8/20was personally reviewed today and demonstrated: NSR 1st degree AVB, new LBBB compared to 2012 EKG, otherwise nonspecific changes related to bundle  Recent Labs: 12/21/2018: TSH 2.350 01/06/2019: Hemoglobin 13.3; Platelets 210 04/26/2019: ALT 9; BUN 11; Creatinine, Ser 0.93; Potassium 3.8; Sodium 139   Recent Lipid Panel Lab Results  Component Value Date/Time   CHOL 141 04/26/2019 09:12 AM   TRIG 59 04/26/2019 09:12 AM   TRIG 105 05/10/2008   HDL 72 04/26/2019 09:12 AM   CHOLHDL 2.0 04/26/2019 09:12 AM   CHOLHDL 3 08/03/2018 10:25 AM   LDLCALC 57 04/26/2019 09:12 AM   LDLCALC 101 05/10/2008    Wt Readings from Last 3 Encounters:  04/30/19 170 lb (77.1 kg)  03/17/19 170 lb (77.1 kg)  03/15/19 172 lb (78 kg)     Objective:    Vital Signs:  BP (!) 146/75    Pulse 70    Ht 5\' 6"  (1.676 m)    Wt 170 lb (77.1 kg)    BMI 27.44 kg/m    VS reviewed. General - pleasant well appearing WM in no acute distress HEENT - NCAT, EOM intact Pulm - No labored breathing, no coughing during visit, no audible wheezing, speaking in full sentences Neuro - A+Ox3, no slurred speech, answers questions appropriately Psych - Pleasant affect     ASSESSMENT & PLAN:    1. Chronic combined CHF with moderate pulmonary HTN - initially he had some low BP swings prohibiting aggressive med titration but has not had any low readings since over 2 weeks ago. Will add spironolactone 12.5mg  daily. I am hopeful he will tolerate this despite variable pressures in the past. He is reliable with home BP monitoring and will send in readings on MyChart on Tuesday 10/20 for review. We also reviewed symptoms of low BP. Will check BMET in 1 week - their preference is through [facility] due to Covid-19 pandemic. Given persistent LV dysfunction I will refer to EP to get their thoughts on CRT-D at this juncture. They  may feel we need to follow a little longer if able to tolerate the spironolactone. The patient reports he has no cardiac symptoms but may need CPX to definitively prove he is NYHA class I over class II. I will await input from the electrophysiology team, appreciate their assistance.   2. Moderate/severe aortic insufficiency and mild aortic stenosis - pending review by Dr. Radford Pax. His AI is  listed as severe in one section of the echo report but moderate in another. Whether it is moderate or severe will delineate whether TEE is required for further evaluation. He is generally asymptomatic regardless. 3. CAD - asymptomatic, no angina. Lipids well controlled. Continue ASA, BB, statin. 4. Mildly dilated aortic root + ascending aorta - recently followed up by echocardiogram. I anticipate this will continue to be followed by echo for now, with repeat due 04/2020 if not already reassessed before then in the interim.  COVID-19 Education: The signs and symptoms of COVID-19 were discussed with the patient and how to seek care for testing (follow up with PCP or arrange E-visit).  The importance of social distancing was discussed today.  Time:   Today, I have spent 18 minutes with the patient with telehealth technology discussing the above problems. Additional 7 minutes spent reviewing records.   Medication Adjustments/Labs and Tests Ordered: Current medicines are reviewed at length with the patient today.  Concerns regarding medicines are outlined above.   Disposition:  Refer to EP. F/u with me virtually in 6 weeks and Dr. Radford Pax in 3 months (can be virtual or in-person).  Signed, Charlie Pitter, PA-C  04/30/2019 8:24 AM    [place]

## 2019-04-30 ENCOUNTER — Telehealth: Payer: Self-pay | Admitting: *Deleted

## 2019-04-30 ENCOUNTER — Other Ambulatory Visit: Payer: Self-pay

## 2019-04-30 ENCOUNTER — Encounter: Payer: Self-pay | Admitting: Physician Assistant

## 2019-04-30 ENCOUNTER — Telehealth (INDEPENDENT_AMBULATORY_CARE_PROVIDER_SITE_OTHER): Payer: Medicare Other | Admitting: Physician Assistant

## 2019-04-30 ENCOUNTER — Telehealth: Payer: Self-pay | Admitting: Physician Assistant

## 2019-04-30 VITALS — BP 146/75 | HR 70 | Ht 66.0 in | Wt 170.0 lb

## 2019-04-30 DIAGNOSIS — I272 Pulmonary hypertension, unspecified: Secondary | ICD-10-CM

## 2019-04-30 DIAGNOSIS — I5042 Chronic combined systolic (congestive) and diastolic (congestive) heart failure: Secondary | ICD-10-CM

## 2019-04-30 DIAGNOSIS — I350 Nonrheumatic aortic (valve) stenosis: Secondary | ICD-10-CM

## 2019-04-30 DIAGNOSIS — I351 Nonrheumatic aortic (valve) insufficiency: Secondary | ICD-10-CM

## 2019-04-30 DIAGNOSIS — I77810 Thoracic aortic ectasia: Secondary | ICD-10-CM

## 2019-04-30 DIAGNOSIS — I2510 Atherosclerotic heart disease of native coronary artery without angina pectoris: Secondary | ICD-10-CM

## 2019-04-30 DIAGNOSIS — I428 Other cardiomyopathies: Secondary | ICD-10-CM

## 2019-04-30 MED ORDER — SPIRONOLACTONE 25 MG PO TABS: 12.5000 mg | ORAL_TABLET | Freq: Every day | ORAL | 3 refills | Status: DC

## 2019-04-30 NOTE — Telephone Encounter (Signed)
[place] Visit Initial Request  Date of Request ([place]):  April 30, 2019  Requesting Provider:  Melina Copa, [facility]    Agency Requested:    [facility] Contact:  Glory Buff, NP [address] [place], [place] [address] Phone #:  [phone] Fax #:  [phone]  Patient Demographic Information: Name:  Anthony Daugherty Age:  73 y.o.   DOB:  Nov 21, 1945  MRN:  [id number]   Address:   [place] [address]   Phone Numbers:   Home Phone [phone]  Mobile [phone]     Emergency Contact Information on File:   Contact Information    Name Relation Home Work Mobile   Anthony Daugherty Spouse   [phone]      The above family members may be contacted for information on this patient (review DPR on file):  Yes    Patient Clinical Information:  Primary Care Provider:  Tammi Sou, MD  Primary Cardiologist:  Fransico Him, MD  Primary Electrophysiologist:  None   Past Medical Hx: Anthony Daugherty  has a past medical history of Aortic regurgitation, Aortic stenosis, mild, Arthritis, Ascending aorta dilatation [facility]), CARDIAC MURMUR (08/13/2010; 2019), Cardiomyopathy ([place]) (10/12/2010), Chronic combined systolic and diastolic CHF (congestive heart failure) ([place]), Coronary artery disease (11/16/2010), Dilated aortic root ([place]) (10/21/2018), Diverticulosis, Erectile dysfunction (08/16/2011), ESSENTIAL HYPERTENSION (07/19/2010), GAD (generalized anxiety disorder), Gout, Habitual alcohol use, colonoscopy (2004; 2014), Hyperlipidemia (10/23/2012), LBBB (left bundle branch block), Low back strain (10/24/2013), Obesity, Class I, BMI 30-34.9, Thyroid nodule (04/23/2013), and TOBACCO ABUSE (08/13/2010).   Allergies: He is allergic to penicillins.   Medications: Current Outpatient Medications on File Prior to Visit  Medication Sig  . acetaminophen (TYLENOL) 500 MG tablet Take 500-1,000 mg by mouth every 6 (six) hours as needed (for  pain.).  Marland Kitchen aspirin 81 MG tablet Take 81 mg by mouth daily.    Marland Kitchen atorvastatin (LIPITOR) 40 MG tablet Take 1 tablet (40 mg total) by mouth daily.  . carvedilol (COREG) 25 MG tablet Take 1 tablet (25 mg total) by mouth 2 (two) times daily.  . colchicine 0.6 MG tablet TAKE 2 TABLETS AT ONSET OF GOUT PAIN, REPEAT IN 1 HOUR, THEN 1 TABLETTWICE/DAY TIL RESOLVED  . ENTRESTO 49-51 MG TAKE  (1)  TABLET TWICE A DAY.  Marland Kitchen FLUoxetine (PROZAC) 20 MG capsule Take 20 mg by mouth daily.   Marland Kitchen spironolactone (ALDACTONE) 25 MG tablet Take 0.5 tablets (12.5 mg total) by mouth daily.   No current facility-administered medications on file prior to visit.      Social Hx: He  reports that he has never smoked. His smokeless tobacco use includes chew. He reports current alcohol use of about 28.0 standard drinks of alcohol per week. He reports that he does not use drugs.    Diagnosis/Reason for Visit:   CHF, LABS   Services Requested:  Vital Signs (BP, Pulse, O2, Weight)  Physical Exam  Labs:  BMET  # of Visits Needed/Frequency per Week: 1 VISIT TO BE DONE IN 1 WEEK

## 2019-04-30 NOTE — Telephone Encounter (Signed)
New Message   Betsy with [facility] is calling in reference to lab orders that she received fax to her.

## 2019-04-30 NOTE — Patient Instructions (Addendum)
Medication Instructions:  Your physician has recommended you make the following change in your medication:  1.  START Spironolactone 25 mg taking only 1/2 tablet daily.  Follow your blood pressure and send [place] readings on Tuesday for our review   Let us know if you have any low blood pressure readings (less than 100 on the top number) or symptoms of low blood pressure   *If you need a refill on your cardiac medications before your next appointment, please call your pharmacy*  Lab Work: 1 WEEK: [place],  IF THEY CANNOT ACCOMMODATE, [place].   If you have labs (blood work) drawn today and your tests are completely normal, you will receive your results only by: Marland Kitchen MyChart Message (if you have MyChart) OR . A paper copy in the mail If you have any lab test that is abnormal or we need to change your treatment, we will call you to review the results.  Testing/Procedures: None ordered   You have been referred to see one of our Electrophysiologist Specialist in our office.  Someone will call you with an appointment.   Follow-Up: At [facility], you and your health needs are our priority.  As part of our continuing mission to provide you with exceptional heart care, we have created designated Provider Care Teams.  These Care Teams include your primary Cardiologist (physician) and Advanced Practice Providers (APPs -  Physician Assistants and Nurse Practitioners) who all work together to provide you with the care you need, when you need it.  Your next appointment:   6 weks 06/08/2019 8:00 a.m VIRTUAL Visit with Melina Copa, PA-C 3 months 07/28/2019 8:00 a.m. VIRTUAL with Dr. Radford Pax at 8:00 a.m.  Other Instructions I will let you know when I hear back from Dr. Radford Pax about the severity of your aortic valve leaking

## 2019-04-30 NOTE — Telephone Encounter (Signed)
Betsy with [facility] called to report that pt is out of their zip code to see the pt for labwork.   She recommended that we try to use [facility] in his area.

## 2019-04-30 NOTE — Telephone Encounter (Signed)
I s/w Betsy with [facility] in regards to earlier message today. I did explain to [place] that Melina Copa, [facility] would like to see if [facility] would still go out and see the pt since they have seen the pt before. However Melina Copa, [facility] is also ok to if need be to send request to [facility]. Per Gwinda Passe, if we could please send request to [facility] that would be helpful.   I explained to [place] that we have been told that [facility] does not draw labs. Gwinda Passe, said let her s/w her co-worker Nira Conn and they will call back on Monday. I explained that I am off Monday however; one of the other CMA triage can handle the call. Gwinda Passe thanked me for the call. I will forward to Melina Copa, PAC as FYI.

## 2019-04-30 NOTE — Telephone Encounter (Signed)
Note   I s/w Betsy with [facility] in regards to earlier message today. I did explain to [place] that Melina Copa, [facility] would like to see if [facility] would still go out and see the pt since they have seen the pt before. However Melina Copa, [facility] is also ok to if need be to send request to [facility]. Per Gwinda Passe, if we could please send request to [facility] that would be helpful.   I explained to [place] that we have been told that [facility] does not draw labs. Gwinda Passe, said let her s/w her co-worker Nira Conn and they will call back on Monday. I explained that I am off Monday however; one of the other CMA triage can handle the call. Gwinda Passe thanked me for the call. I will forward to Melina Copa, PAC as FYI.

## 2019-04-30 NOTE — Telephone Encounter (Signed)
That's fine with me. [facility] had gone out before - I know they had been a little short staffed but could reach out to see if they could accomodate since they had previously seen patient - whatever works to get the lab is fine by me! Dayna Dunn PA-C

## 2019-04-30 NOTE — Telephone Encounter (Signed)
I am going to route this to Melina Copa, [facility] ordering provider for Home Visit for further advice if ok to change to [facility].

## 2019-05-04 ENCOUNTER — Telehealth: Payer: Self-pay

## 2019-05-05 ENCOUNTER — Telehealth (INDEPENDENT_AMBULATORY_CARE_PROVIDER_SITE_OTHER): Payer: Medicare Other | Admitting: Internal Medicine

## 2019-05-05 ENCOUNTER — Encounter: Payer: Self-pay | Admitting: Internal Medicine

## 2019-05-05 ENCOUNTER — Telehealth: Payer: Self-pay

## 2019-05-05 VITALS — BP 145/59 | HR 64 | Ht 66.0 in | Wt 170.0 lb

## 2019-05-05 DIAGNOSIS — I351 Nonrheumatic aortic (valve) insufficiency: Secondary | ICD-10-CM | POA: Diagnosis not present

## 2019-05-05 DIAGNOSIS — I2510 Atherosclerotic heart disease of native coronary artery without angina pectoris: Secondary | ICD-10-CM | POA: Diagnosis not present

## 2019-05-05 DIAGNOSIS — I5042 Chronic combined systolic (congestive) and diastolic (congestive) heart failure: Secondary | ICD-10-CM

## 2019-05-05 NOTE — Telephone Encounter (Signed)
-----   Message from [name], MD sent at 05/05/2019  3:39 PM EDT ----- Schedule BiV ICD implant

## 2019-05-05 NOTE — H&P (View-Only) (Signed)
Electrophysiology TeleHealth Note   Due to national recommendations of social distancing due to [place] 19, Audio/video telehealth visit is felt to be most appropriate for this patient at this time.  See MyChart message from today for patient consent regarding telehealth for [facility].   Date:  05/05/2019   ID:  MATTI KEPP, DOB 23-Feb-1946, MRN [id number]  Location: home  Provider location: [address] [place] Evaluation Performed: New patient consult  PCP:  Tammi Sou, MD  Cardiologist:  Fransico Him, MD  Electrophysiologist:  Dehlia Kilner  Chief Complaint:  LV dysfunction  History of Present Illness:    Anthony Daugherty is a 73 y.o. male who presents via audio/video conferencing for a telehealth visit today.   The patient is referred for new consultation regarding LV dysfunction, LBBB by [facility]/Dr Turner.  He has LV dysfunction despite guideline directed therapy (?ETOH related), CAD, LBBB, pulmonary hypertension, moderate AI.  He has LV dysfunction which is felt to be a mixed cardiomyopathy.  He is fairly active at home doing yardwork, working in the Nurse, children's.  He lives in [place] with his wife and dog. He is a retired Education officer, community for a Chiropractor.  Today, he denies symptoms of palpitations, chest pain, shortness of breath, orthopnea, PND, lower extremity edema, claudication, dizziness, presyncope, syncope, bleeding, or neurologic sequela. The patient is tolerating medications without difficulties and is otherwise without complaint today.   he denies symptoms of cough, fevers, chills, or new SOB worrisome for COVID 19.   Past Medical History:  Diagnosis Date  . Aortic regurgitation    Mild-mod on echo 07/2017.  Moderate 08/2018.  Moderate-severe on echo 04/2019.  Marland Kitchen Aortic stenosis, mild   . Arthritis    L-Spine  . Ascending aorta dilatation (HCC)   . CARDIAC MURMUR Q000111Q; [id number]   Systolic and diastolic as of 123456.  .  Cardiomyopathy ([place]) 10/12/2010   Global hypokinesis, EF 40-45% by cath and echo (2012 echo and 2019 echo).  Feb 2020 EF 20-25%. - mixed ICM/NICM (alcohol and CAD)  October 2020 EF 25-30%.  . Chronic combined systolic and diastolic CHF (congestive heart failure) ([place])   . Coronary artery disease 11/16/2010   a. 2012 - 80% OM lesion. b. Cath 12/2018 - showing 95% prox RCA, 75% OM1, 50% prox-mid LAD. Unsuccessful PCI to 100% chronically occluded RCA summer 2020.  . Dilated aortic root ([place]) 10/21/2018  . Diverticulosis   . Erectile dysfunction 08/16/2011  . ESSENTIAL HYPERTENSION 07/19/2010   Qualifier: Diagnosis of  By: Anitra Lauth M.D., Brien Few   . GAD (generalized anxiety disorder)    managed by Dr. Toy Care with prozac 20mg  qd  . Gout    Usually Right great toe; colchicine helps well  . Habitual alcohol use   . Hx of colonoscopy 2004; 2014   Initial was normal; 2014-polypectomy and diverticulosis.  Recall 2019.  Marland Kitchen Hyperlipidemia 10/23/2012  . LBBB (left bundle branch block)   . Low back strain 10/24/2013  . Obesity, Class I, BMI 30-34.9   . Thyroid nodule 04/23/2013  . TOBACCO ABUSE 08/13/2010    Past Surgical History:  Procedure Laterality Date  . CARDIAC CATHETERIZATION  10/12/10   diagnostic only: 1 vessel CAD with TIMI 3 flow, EF 40-45%.  Mild AS.  Medical mgmt of CAD.  Marland Kitchen COLONOSCOPY W/ POLYPECTOMY  10/06/12   Diverticulosis and tubular adenoma w/out high grade dysplasia.  Repeat 5 yrs (Dr. Deatra Ina).  . CORONARY  ATHERECTOMY N/A 01/08/2019   100% chronic RCA occlusion impossible to clear.  Med mgmt. Procedure: CORONARY ATHERECTOMY;  Surgeon: Sherren Mocha, MD;  Location: [facility];  Service: Cardiovascular;  Laterality: N/A;  . RIGHT HEART CATH AND CORONARY ANGIOGRAPHY N/A 12/24/2018   95% RCA occlusion, 75% obtuse marginal occlusion (3V dz). Procedure: RIGHT HEART CATH AND CORONARY ANGIOGRAPHY;  Surgeon: Lorretta Harp, MD;  Location: [facility];  Service:  Cardiovascular;  Laterality: N/A;  . TRANSTHORACIC ECHOCARDIOGRAM  09/2010; 07/2017; 08/2018   2012:  EF 40-45%, global hypokinesis, mild AS.  07/2017: no interval change except mild/mod aortic regurg.  08/2018 EF 20-25%, global LV hypokinesis, diastolic dyfxn, biatrial enlargemt, mod AR, aortic root 3.8 cm.    Current Outpatient Medications  Medication Sig Dispense Refill  . acetaminophen (TYLENOL) 500 MG tablet Take 500-1,000 mg by mouth every 6 (six) hours as needed (for pain.).    Marland Kitchen aspirin 81 MG tablet Take 81 mg by mouth daily.      . carvedilol (COREG) 25 MG tablet Take 1 tablet (25 mg total) by mouth 2 (two) times daily. 180 tablet 3  . colchicine 0.6 MG tablet TAKE 2 TABLETS AT ONSET OF GOUT PAIN, REPEAT IN 1 HOUR, THEN 1 TABLETTWICE/DAY TIL RESOLVED 30 tablet 5  . ENTRESTO 49-51 MG TAKE  (1)  TABLET TWICE A DAY. 60 tablet 10  . FLUoxetine (PROZAC) 20 MG capsule Take 20 mg by mouth daily.     Marland Kitchen spironolactone (ALDACTONE) 25 MG tablet Take 0.5 tablets (12.5 mg total) by mouth daily. 45 tablet 3  . atorvastatin (LIPITOR) 40 MG tablet Take 1 tablet (40 mg total) by mouth daily. 90 tablet 3   No current facility-administered medications for this visit.     Allergies:   Penicillins   Social History:  The patient  reports that he has never smoked. His smokeless tobacco use includes chew. He reports current alcohol use of about 28.0 standard drinks of alcohol per week. He reports that he does not use drugs.   Family History:  The patient's family history includes Alzheimer's disease in his mother; COPD in his father; Congenital heart disease in his brother; Heart failure in his father.    ROS:  Please see the history of present illness.   All other systems are personally reviewed and negative.    Exam:    Vital Signs:  BP (!) 145/59   Pulse 64   Ht 5\' 6"  (1.676 m)   Wt 170 lb (77.1 kg)   BMI 27.44 kg/m    Well appearing, alert and conversant, regular work of breathing,  good skin  color Eyes- anicteric, neuro- grossly intact, skin- no apparent rash or lesions or cyanosis, mouth- oral mucosa is pink   Labs/Other Tests and Data Reviewed:    Recent Labs: 12/21/2018: TSH 2.350 01/06/2019: Hemoglobin 13.3; Platelets 210 04/26/2019: ALT 9; BUN 11; Creatinine, Ser 0.93; Potassium 3.8; Sodium 139   Wt Readings from Last 3 Encounters:  05/05/19 170 lb (77.1 kg)  04/30/19 170 lb (77.1 kg)  03/17/19 170 lb (77.1 kg)     Other studies personally reviewed: Additional studies/ records that were reviewed today include: Dayna Dunn's office notes  ASSESSMENT & PLAN:    1.  Chronic systolic heart failure (Class II) He has LV dysfunction despite guideline directed therapy I have seen TRICIA GUILBEAU is a 73 y.o. male in the office today who has been referred by Dr Radford Pax for consideration  of ICD implant for primary prevention of sudden death.  The patient's chart has been reviewed and they meet criteria for ICD implant.  I have had a thorough discussion with the patient reviewing options.  The patient and their family (if available) have had opportunities to ask questions and have them answered. The patient and I have decided together through a shared decision making process to proceed with ICD at this time.  Risks, benefits, alternatives to ICD implantation were discussed in detail with the patient today. The patient  understands that the risks include but are not limited to bleeding, infection, pneumothorax, perforation, tamponade, vascular damage, renal failure, MI, stroke, death, inappropriate shocks, and lead dislodgement and wishes to proceed.  We will therefore schedule device implantation at the next available time. With LBBB, anticipate he would benefit from CRTD.    2.  Moderate AI Asymptomatic Followed by Dr Radford Pax  3.  CAD No ischemic symptoms Continue current therapy   COVID screen The patient does not have any symptoms that suggest any further testing/  screening at this time.  Social distancing reinforced today.  Follow-up:  After CRTD implant  Current medicines are reviewed at length with the patient today.   The patient does not have concerns regarding his medicines.  The following changes were made today:  none  Labs/ tests ordered today include: none No orders of the defined types were placed in this encounter.   Patient Risk:  after full review of this patients clinical status, I feel that they are at high risk at this time.   Today, I have spent 20 minutes with the patient with telehealth technology discussing LV dysfunction.    Signed, [name] MD, [facility] 05/05/2019 3:26 PM   [place] [address] [place] [address] [phone] (office) [phone] (fax)

## 2019-05-05 NOTE — Progress Notes (Addendum)
Electrophysiology TeleHealth Note   Due to national recommendations of social distancing due to [place] 19, Audio/video telehealth visit is felt to be most appropriate for this patient at this time.  See MyChart message from today for patient consent regarding telehealth for [facility].   Date:  05/05/2019   ID:  HENRRY CHENAULT, DOB 08/28/1945, MRN [id number]  Location: home  Provider location: [address] [place] Evaluation Performed: New patient consult  PCP:  Tammi Sou, MD  Cardiologist:  Fransico Him, MD  Electrophysiologist:  Apryl Brymer  Chief Complaint:  LV dysfunction  History of Present Illness:    BREANDAN BAMONTE is a 73 y.o. male who presents via audio/video conferencing for a telehealth visit today.   The patient is referred for new consultation regarding LV dysfunction, LBBB by [name]/Dr Turner.  He has LV dysfunction despite guideline directed therapy (?ETOH related), CAD, LBBB, pulmonary hypertension, moderate AI.  He has LV dysfunction which is felt to be a mixed cardiomyopathy.  He is fairly active at home doing yardwork, working in the Nurse, children's.  He lives in [place] with his wife and dog. He is a retired Education officer, community for a Chiropractor.  Today, he denies symptoms of palpitations, chest pain, shortness of breath, orthopnea, PND, lower extremity edema, claudication, dizziness, presyncope, syncope, bleeding, or neurologic sequela. The patient is tolerating medications without difficulties and is otherwise without complaint today.   he denies symptoms of cough, fevers, chills, or new SOB worrisome for COVID 19.   Past Medical History:  Diagnosis Date  . Aortic regurgitation    Mild-mod on echo 07/2017.  Moderate 08/2018.  Moderate-severe on echo 04/2019.  Marland Kitchen Aortic stenosis, mild   . Arthritis    L-Spine  . Ascending aorta dilatation (HCC)   . CARDIAC MURMUR Q000111Q; [id number]   Systolic and diastolic as of 123456.  .  Cardiomyopathy ([place]) 10/12/2010   Global hypokinesis, EF 40-45% by cath and echo (2012 echo and 2019 echo).  Feb 2020 EF 20-25%. - mixed ICM/NICM (alcohol and CAD)  October 2020 EF 25-30%.  . Chronic combined systolic and diastolic CHF (congestive heart failure) ([place])   . Coronary artery disease 11/16/2010   a. 2012 - 80% OM lesion. b. Cath 12/2018 - showing 95% prox RCA, 75% OM1, 50% prox-mid LAD. Unsuccessful PCI to 100% chronically occluded RCA summer 2020.  . Dilated aortic root ([place]) 10/21/2018  . Diverticulosis   . Erectile dysfunction 08/16/2011  . ESSENTIAL HYPERTENSION 07/19/2010   Qualifier: Diagnosis of  By: Anitra Lauth M.D., Brien Few   . GAD (generalized anxiety disorder)    managed by Dr. Toy Care with prozac 20mg  qd  . Gout    Usually Right great toe; colchicine helps well  . Habitual alcohol use   . Hx of colonoscopy 2004; 2014   Initial was normal; 2014-polypectomy and diverticulosis.  Recall 2019.  Marland Kitchen Hyperlipidemia 10/23/2012  . LBBB (left bundle branch block)   . Low back strain 10/24/2013  . Obesity, Class I, BMI 30-34.9   . Thyroid nodule 04/23/2013  . TOBACCO ABUSE 08/13/2010    Past Surgical History:  Procedure Laterality Date  . CARDIAC CATHETERIZATION  10/12/10   diagnostic only: 1 vessel CAD with TIMI 3 flow, EF 40-45%.  Mild AS.  Medical mgmt of CAD.  Marland Kitchen COLONOSCOPY W/ POLYPECTOMY  10/06/12   Diverticulosis and tubular adenoma w/out high grade dysplasia.  Repeat 5 yrs (Dr. Deatra Ina).  . CORONARY  ATHERECTOMY N/A 01/08/2019   100% chronic RCA occlusion impossible to clear.  Med mgmt. Procedure: CORONARY ATHERECTOMY;  Surgeon: Sherren Mocha, MD;  Location: [facility];  Service: Cardiovascular;  Laterality: N/A;  . RIGHT HEART CATH AND CORONARY ANGIOGRAPHY N/A 12/24/2018   95% RCA occlusion, 75% obtuse marginal occlusion (3V dz). Procedure: RIGHT HEART CATH AND CORONARY ANGIOGRAPHY;  Surgeon: Lorretta Harp, MD;  Location: [facility];  Service:  Cardiovascular;  Laterality: N/A;  . TRANSTHORACIC ECHOCARDIOGRAM  09/2010; 07/2017; 08/2018   2012:  EF 40-45%, global hypokinesis, mild AS.  07/2017: no interval change except mild/mod aortic regurg.  08/2018 EF 20-25%, global LV hypokinesis, diastolic dyfxn, biatrial enlargemt, mod AR, aortic root 3.8 cm.    Current Outpatient Medications  Medication Sig Dispense Refill  . acetaminophen (TYLENOL) 500 MG tablet Take 500-1,000 mg by mouth every 6 (six) hours as needed (for pain.).    Marland Kitchen aspirin 81 MG tablet Take 81 mg by mouth daily.      . carvedilol (COREG) 25 MG tablet Take 1 tablet (25 mg total) by mouth 2 (two) times daily. 180 tablet 3  . colchicine 0.6 MG tablet TAKE 2 TABLETS AT ONSET OF GOUT PAIN, REPEAT IN 1 HOUR, THEN 1 TABLETTWICE/DAY TIL RESOLVED 30 tablet 5  . ENTRESTO 49-51 MG TAKE  (1)  TABLET TWICE A DAY. 60 tablet 10  . FLUoxetine (PROZAC) 20 MG capsule Take 20 mg by mouth daily.     Marland Kitchen spironolactone (ALDACTONE) 25 MG tablet Take 0.5 tablets (12.5 mg total) by mouth daily. 45 tablet 3  . atorvastatin (LIPITOR) 40 MG tablet Take 1 tablet (40 mg total) by mouth daily. 90 tablet 3   No current facility-administered medications for this visit.     Allergies:   Penicillins   Social History:  The patient  reports that he has never smoked. His smokeless tobacco use includes chew. He reports current alcohol use of about 28.0 standard drinks of alcohol per week. He reports that he does not use drugs.   Family History:  The patient's family history includes Alzheimer's disease in his mother; COPD in his father; Congenital heart disease in his brother; Heart failure in his father.    ROS:  Please see the history of present illness.   All other systems are personally reviewed and negative.    Exam:    Vital Signs:  BP (!) 145/59   Pulse 64   Ht 5\' 6"  (1.676 m)   Wt 170 lb (77.1 kg)   BMI 27.44 kg/m    Well appearing, alert and conversant, regular work of breathing,  good skin  color Eyes- anicteric, neuro- grossly intact, skin- no apparent rash or lesions or cyanosis, mouth- oral mucosa is pink   Labs/Other Tests and Data Reviewed:    Recent Labs: 12/21/2018: TSH 2.350 01/06/2019: Hemoglobin 13.3; Platelets 210 04/26/2019: ALT 9; BUN 11; Creatinine, Ser 0.93; Potassium 3.8; Sodium 139   Wt Readings from Last 3 Encounters:  05/05/19 170 lb (77.1 kg)  04/30/19 170 lb (77.1 kg)  03/17/19 170 lb (77.1 kg)     Other studies personally reviewed: Additional studies/ records that were reviewed today include: Dayna Dunn's office notes  ASSESSMENT & PLAN:    1.  Chronic systolic heart failure (Class II) He has LV dysfunction despite guideline directed therapy I have seen ANTAR DOLLMAN is a 73 y.o. male in the office today who has been referred by Dr Radford Pax for consideration  of ICD implant for primary prevention of sudden death.  The patient's chart has been reviewed and they meet criteria for ICD implant.  I have had a thorough discussion with the patient reviewing options.  The patient and their family (if available) have had opportunities to ask questions and have them answered. The patient and I have decided together through a shared decision making process to proceed with ICD at this time.  Risks, benefits, alternatives to ICD implantation were discussed in detail with the patient today. The patient  understands that the risks include but are not limited to bleeding, infection, pneumothorax, perforation, tamponade, vascular damage, renal failure, MI, stroke, death, inappropriate shocks, and lead dislodgement and wishes to proceed.  We will therefore schedule device implantation at the next available time. With LBBB, anticipate he would benefit from CRTD.    2.  Moderate AI Asymptomatic Followed by Dr Radford Pax  3.  CAD No ischemic symptoms Continue current therapy   COVID screen The patient does not have any symptoms that suggest any further testing/  screening at this time.  Social distancing reinforced today.  Follow-up:  After CRTD implant  Current medicines are reviewed at length with the patient today.   The patient does not have concerns regarding his medicines.  The following changes were made today:  none  Labs/ tests ordered today include: none No orders of the defined types were placed in this encounter.   Patient Risk:  after full review of this patients clinical status, I feel that they are at high risk at this time.   Today, I have spent 20 minutes with the patient with telehealth technology discussing LV dysfunction.    Signed, [name] MD, [facility] 05/05/2019 3:26 PM   [place] [address] [place] [address] [phone] (office) [phone] (fax)

## 2019-05-07 NOTE — Telephone Encounter (Signed)
Outreach made to Pt.    Left VM.  Advised MyChart message had been sent with available days for procedures.  Advised would attempt outreach to Pt on Monday if no mychart message received.

## 2019-05-08 LAB — BASIC METABOLIC PANEL
BUN/Creatinine Ratio: 17 (ref 10–24)
BUN: 15 mg/dL (ref 8–27)
CO2: 23 mmol/L (ref 20–29)
Calcium: 8.7 mg/dL (ref 8.6–10.2)
Chloride: 108 mmol/L — ABNORMAL HIGH (ref 96–106)
Creatinine, Ser: 0.9 mg/dL (ref 0.76–1.27)
GFR calc Af Amer: 98 mL/min/{1.73_m2} (ref >59)
GFR calc non Af Amer: 85 mL/min/{1.73_m2} (ref >59)
Glucose: 90 mg/dL (ref 65–99)
Potassium: 4.7 mmol/L (ref 3.5–5.2)
Sodium: 143 mmol/L (ref 134–144)

## 2019-05-09 ENCOUNTER — Encounter: Payer: Self-pay | Admitting: Family Medicine

## 2019-05-09 NOTE — Progress Notes (Addendum)
Anthony Daugherty      DOB: May 08, 1946  Purpose of Visit: BMET, VS, exam on behalf of [facility]  Medications: Is the patient taking all medications listed on MAR from Epic? Yes         Physical Exam:  Lung sounds: clear  Heart sounds: regular  Edema: none  Any patient concerns? None.  His wife is concerned about Covid but he isn't as much as she.   ReDS Vest/Clip Reading: n/a  Rhythm Strip: n/a  Is Home Health recommended? No  He expressed no concerns and stated he feels good.   BMET drawn and taken to [facility] on [address]   [place], [place] 05/09/19

## 2019-05-12 NOTE — Telephone Encounter (Signed)
BIV ICD implant scheduled.  Work up complete

## 2019-05-28 ENCOUNTER — Inpatient Hospital Stay (HOSPITAL_COMMUNITY): Admission: RE | Admit: 2019-05-28 | Payer: Medicare Other | Source: Ambulatory Visit

## 2019-05-28 ENCOUNTER — Other Ambulatory Visit: Payer: Medicare Other | Admitting: *Deleted

## 2019-05-28 ENCOUNTER — Other Ambulatory Visit: Payer: Self-pay

## 2019-05-28 DIAGNOSIS — I5042 Chronic combined systolic (congestive) and diastolic (congestive) heart failure: Secondary | ICD-10-CM

## 2019-05-28 LAB — CBC WITH DIFFERENTIAL/PLATELET
Basophils Absolute: 0 10*3/uL (ref 0.0–0.2)
Basos: 1 %
EOS (ABSOLUTE): 0.2 10*3/uL (ref 0.0–0.4)
Eos: 5 %
Hematocrit: 37.9 % (ref 37.5–51.0)
Hemoglobin: 13.2 g/dL (ref 13.0–17.7)
Immature Grans (Abs): 0 10*3/uL (ref 0.0–0.1)
Immature Granulocytes: 0 %
Lymphocytes Absolute: 0.9 10*3/uL (ref 0.7–3.1)
Lymphs: 25 %
MCH: 31.8 pg (ref 26.6–33.0)
MCHC: 34.8 g/dL (ref 31.5–35.7)
MCV: 91 fL (ref 79–97)
Monocytes Absolute: 0.3 10*3/uL (ref 0.1–0.9)
Monocytes: 8 %
Neutrophils Absolute: 2.3 10*3/uL (ref 1.4–7.0)
Neutrophils: 61 %
Platelets: 205 10*3/uL (ref 150–450)
RBC: 4.15 x10E6/uL (ref 4.14–5.80)
RDW: 13 % (ref 11.6–15.4)
WBC: 3.8 10*3/uL (ref 3.4–10.8)

## 2019-05-28 LAB — BASIC METABOLIC PANEL
BUN/Creatinine Ratio: 17 (ref 10–24)
BUN: 17 mg/dL (ref 8–27)
CO2: 19 mmol/L — ABNORMAL LOW (ref 20–29)
Calcium: 9.1 mg/dL (ref 8.6–10.2)
Chloride: 106 mmol/L (ref 96–106)
Creatinine, Ser: 1.02 mg/dL (ref 0.76–1.27)
GFR calc Af Amer: 84 mL/min/{1.73_m2} (ref >59)
GFR calc non Af Amer: 73 mL/min/{1.73_m2} (ref >59)
Glucose: 95 mg/dL (ref 65–99)
Potassium: 4.1 mmol/L (ref 3.5–5.2)
Sodium: 139 mmol/L (ref 134–144)

## 2019-05-29 ENCOUNTER — Other Ambulatory Visit (HOSPITAL_COMMUNITY)
Admission: RE | Admit: 2019-05-29 | Discharge: 2019-05-29 | Disposition: A | Discharge status: Home / Self Care | Payer: Medicare Other | Source: Ambulatory Visit | Attending: Internal Medicine | Admitting: Internal Medicine

## 2019-05-29 DIAGNOSIS — Z01812 Encounter for preprocedural laboratory examination: Secondary | ICD-10-CM | POA: Insufficient documentation

## 2019-05-29 DIAGNOSIS — Z20828 Contact with and (suspected) exposure to other viral communicable diseases: Secondary | ICD-10-CM | POA: Diagnosis not present

## 2019-05-30 LAB — NOVEL CORONAVIRUS, NAA (HOSP ORDER, SEND-OUT TO REF LAB; TAT 18-24 HRS): SARS-CoV-2, NAA: NOT DETECTED

## 2019-05-31 NOTE — Telephone Encounter (Signed)
Call returned to family.  Answered all questions regarding stay ing at the hospital, what type of advice, how big it is, restrictions after.  Family will send mychart message if any further questions.

## 2019-06-01 ENCOUNTER — Encounter (HOSPITAL_COMMUNITY): Payer: Self-pay | Admitting: Emergency Medicine

## 2019-06-01 ENCOUNTER — Other Ambulatory Visit: Payer: Self-pay

## 2019-06-01 ENCOUNTER — Encounter (HOSPITAL_COMMUNITY)
Admission: RE | Disposition: A | Discharge status: Home / Self Care | Payer: Self-pay | Source: Home / Self Care | Attending: Internal Medicine

## 2019-06-01 ENCOUNTER — Ambulatory Visit (HOSPITAL_COMMUNITY)
Admission: RE | Admit: 2019-06-01 | Discharge: 2019-06-02 | Disposition: A | Discharge status: Home / Self Care | Payer: Medicare Other | Attending: Internal Medicine | Admitting: Internal Medicine

## 2019-06-01 DIAGNOSIS — Z6827 Body mass index (BMI) 27.0-27.9, adult: Secondary | ICD-10-CM | POA: Diagnosis not present

## 2019-06-01 DIAGNOSIS — Z880 Allergy status to penicillin: Secondary | ICD-10-CM | POA: Diagnosis not present

## 2019-06-01 DIAGNOSIS — Z7982 Long term (current) use of aspirin: Secondary | ICD-10-CM | POA: Diagnosis not present

## 2019-06-01 DIAGNOSIS — Z79899 Other long term (current) drug therapy: Secondary | ICD-10-CM | POA: Diagnosis not present

## 2019-06-01 DIAGNOSIS — I447 Left bundle-branch block, unspecified: Secondary | ICD-10-CM | POA: Insufficient documentation

## 2019-06-01 DIAGNOSIS — I2510 Atherosclerotic heart disease of native coronary artery without angina pectoris: Secondary | ICD-10-CM | POA: Insufficient documentation

## 2019-06-01 DIAGNOSIS — Z006 Encounter for examination for normal comparison and control in clinical research program: Secondary | ICD-10-CM | POA: Insufficient documentation

## 2019-06-01 DIAGNOSIS — I5022 Chronic systolic (congestive) heart failure: Secondary | ICD-10-CM | POA: Diagnosis not present

## 2019-06-01 DIAGNOSIS — I110 Hypertensive heart disease with heart failure: Secondary | ICD-10-CM | POA: Insufficient documentation

## 2019-06-01 DIAGNOSIS — E785 Hyperlipidemia, unspecified: Secondary | ICD-10-CM | POA: Insufficient documentation

## 2019-06-01 DIAGNOSIS — M109 Gout, unspecified: Secondary | ICD-10-CM | POA: Diagnosis not present

## 2019-06-01 DIAGNOSIS — E669 Obesity, unspecified: Secondary | ICD-10-CM | POA: Diagnosis not present

## 2019-06-01 DIAGNOSIS — Z959 Presence of cardiac and vascular implant and graft, unspecified: Secondary | ICD-10-CM

## 2019-06-01 DIAGNOSIS — M1990 Unspecified osteoarthritis, unspecified site: Secondary | ICD-10-CM | POA: Insufficient documentation

## 2019-06-01 DIAGNOSIS — I428 Other cardiomyopathies: Secondary | ICD-10-CM | POA: Insufficient documentation

## 2019-06-01 DIAGNOSIS — Z8249 Family history of ischemic heart disease and other diseases of the circulatory system: Secondary | ICD-10-CM | POA: Diagnosis not present

## 2019-06-01 HISTORY — PX: BIV ICD INSERTION CRT-D: EP1195

## 2019-06-01 HISTORY — DX: Chronic systolic (congestive) heart failure: I50.22

## 2019-06-01 LAB — SURGICAL PCR SCREEN
MRSA, PCR: NEGATIVE
Staphylococcus aureus: NEGATIVE

## 2019-06-01 SURGERY — BIV ICD INSERTION CRT-D

## 2019-06-01 MED ORDER — HYDROCODONE-ACETAMINOPHEN 5-325 MG PO TABS
1.0000 | ORAL_TABLET | ORAL | Status: DC | PRN
  Administered 2019-06-01: 1 via ORAL
  Filled 2019-06-01: qty 1

## 2019-06-01 MED ORDER — MUPIROCIN 2 % EX OINT: 1.0000 "application " | TOPICAL_OINTMENT | Freq: Once | CUTANEOUS | Status: DC

## 2019-06-01 MED ORDER — FENTANYL CITRATE (PF) 100 MCG/2ML IJ SOLN
INTRAMUSCULAR | Status: DC | PRN
  Administered 2019-06-01: 12.5 ug via INTRAVENOUS

## 2019-06-01 MED ORDER — SACUBITRIL-VALSARTAN 49-51 MG PO TABS
1.0000 | ORAL_TABLET | Freq: Two times a day (BID) | ORAL | Status: DC
  Administered 2019-06-01 – 2019-06-02 (×2): 1 via ORAL
  Filled 2019-06-01 (×2): qty 1

## 2019-06-01 MED ORDER — LIDOCAINE HCL (PF) 1 % IJ SOLN
INTRAMUSCULAR | Status: DC | PRN
  Administered 2019-06-01: 75 mL

## 2019-06-01 MED ORDER — SODIUM CHLORIDE 0.9% FLUSH: 3.0000 mL | INTRAVENOUS | Status: DC | PRN

## 2019-06-01 MED ORDER — VANCOMYCIN HCL IN DEXTROSE 1-5 GM/200ML-% IV SOLN
1000.0000 mg | INTRAVENOUS | Status: AC
  Administered 2019-06-01: 1000 mg via INTRAVENOUS

## 2019-06-01 MED ORDER — IOHEXOL 350 MG/ML SOLN
INTRAVENOUS | Status: DC | PRN
  Administered 2019-06-01: 15 mL
  Administered 2019-06-01: 14:00:00 8 mL

## 2019-06-01 MED ORDER — SODIUM CHLORIDE 0.9 % IV SOLN
INTRAVENOUS | Status: AC
  Filled 2019-06-01: qty 2

## 2019-06-01 MED ORDER — SODIUM CHLORIDE 0.9 % IV SOLN
INTRAVENOUS | Status: DC
  Administered 2019-06-01: 12:00:00 via INTRAVENOUS

## 2019-06-01 MED ORDER — HEPARIN (PORCINE) IN NACL 1000-0.9 UT/500ML-% IV SOLN
INTRAVENOUS | Status: DC | PRN
  Administered 2019-06-01: 500 mL

## 2019-06-01 MED ORDER — CARVEDILOL 25 MG PO TABS
25.0000 mg | ORAL_TABLET | Freq: Two times a day (BID) | ORAL | Status: DC
  Administered 2019-06-01 – 2019-06-02 (×2): 25 mg via ORAL
  Filled 2019-06-01 (×2): qty 1

## 2019-06-01 MED ORDER — FENTANYL CITRATE (PF) 100 MCG/2ML IJ SOLN
INTRAMUSCULAR | Status: AC
  Filled 2019-06-01: qty 2

## 2019-06-01 MED ORDER — MUPIROCIN 2 % EX OINT
TOPICAL_OINTMENT | CUTANEOUS | Status: AC
  Filled 2019-06-01: qty 22

## 2019-06-01 MED ORDER — ONDANSETRON HCL 4 MG/2ML IJ SOLN: 4.0000 mg | Freq: Four times a day (QID) | INTRAMUSCULAR | Status: DC | PRN

## 2019-06-01 MED ORDER — HEPARIN (PORCINE) IN NACL 1000-0.9 UT/500ML-% IV SOLN
INTRAVENOUS | Status: AC
  Filled 2019-06-01: qty 500

## 2019-06-01 MED ORDER — SODIUM CHLORIDE 0.9 % IV SOLN: 250.0000 mL | INTRAVENOUS | Status: DC | PRN

## 2019-06-01 MED ORDER — SODIUM CHLORIDE 0.9 % IV SOLN
80.0000 mg | INTRAVENOUS | Status: AC
  Administered 2019-06-01: 80 mg

## 2019-06-01 MED ORDER — FLUOXETINE HCL 20 MG PO CAPS
20.0000 mg | ORAL_CAPSULE | Freq: Every day | ORAL | Status: DC
  Administered 2019-06-02: 20 mg via ORAL
  Filled 2019-06-01 (×2): qty 1

## 2019-06-01 MED ORDER — SODIUM CHLORIDE 0.9% FLUSH
3.0000 mL | Freq: Two times a day (BID) | INTRAVENOUS | Status: DC
  Administered 2019-06-01 – 2019-06-02 (×3): 3 mL via INTRAVENOUS

## 2019-06-01 MED ORDER — CHLORHEXIDINE GLUCONATE 4 % EX LIQD: 4.0000 "application " | Freq: Once | CUTANEOUS | Status: DC

## 2019-06-01 MED ORDER — ACETAMINOPHEN 325 MG PO TABS: 325.0000 mg | ORAL_TABLET | ORAL | Status: DC | PRN

## 2019-06-01 MED ORDER — LIDOCAINE HCL (PF) 1 % IJ SOLN
INTRAMUSCULAR | Status: AC
  Filled 2019-06-01: qty 90

## 2019-06-01 MED ORDER — VANCOMYCIN HCL IN DEXTROSE 1-5 GM/200ML-% IV SOLN
INTRAVENOUS | Status: AC
  Filled 2019-06-01: qty 200

## 2019-06-01 MED ORDER — SPIRONOLACTONE 12.5 MG HALF TABLET
12.5000 mg | ORAL_TABLET | Freq: Every day | ORAL | Status: DC
  Administered 2019-06-02: 12.5 mg via ORAL
  Filled 2019-06-01: qty 1

## 2019-06-01 MED ORDER — VANCOMYCIN HCL IN DEXTROSE 1-5 GM/200ML-% IV SOLN
1000.0000 mg | Freq: Two times a day (BID) | INTRAVENOUS | Status: AC
  Administered 2019-06-01: 1000 mg via INTRAVENOUS
  Filled 2019-06-01: qty 200

## 2019-06-01 SURGICAL SUPPLY — 16 items
ADAPTER SEALING SSA-EW-09 (MISCELLANEOUS) ×3 IMPLANT
CABLE SURGICAL S-101-97-12 (CABLE) ×3 IMPLANT
CATH ATTAIN COM SURV 6250V-MB2 (CATHETERS) ×3 IMPLANT
CATH HEX JOSEPH 2-5-2 65CM 6F (CATHETERS) ×3 IMPLANT
ICD GALLANT HFCRTD CDHFA500Q (ICD Generator) ×3 IMPLANT
KIT ESSENTIALS PG (KITS) ×3 IMPLANT
LEAD DURATA 7122Q-65CM (Lead) ×3 IMPLANT
LEAD QUARTET 1458Q-86CM (Lead) ×3 IMPLANT
LEAD TENDRIL MRI 52CM LPA1200M (Lead) ×3 IMPLANT
PAD PRO RADIOLUCENT 2001M-C (PAD) ×3 IMPLANT
SHEATH 7FR PRELUDE SNAP 13 (SHEATH) ×3 IMPLANT
SHEATH 8FR PRELUDE SNAP 13 (SHEATH) ×3 IMPLANT
SHEATH 9.5FR PRELUDE SNAP 13 (SHEATH) ×3 IMPLANT
SLITTER 6232ADJ (MISCELLANEOUS) ×3 IMPLANT
TRAY PACEMAKER INSERTION (PACKS) ×3 IMPLANT
WIRE ACUITY WHISPER EDS 4648 (WIRE) ×3 IMPLANT

## 2019-06-01 NOTE — Interval H&P Note (Signed)
History and Physical Interval Note:  06/01/2019 12:39 PM  Anthony Daugherty  has presented today for surgery, with the diagnosis of cardiomyopathy.  The various methods of treatment have been discussed with the patient and family. After consideration of risks, benefits and other options for treatment, the patient has consented to  Procedure(s): BIV ICD INSERTION CRT-D (N/A) as a surgical intervention.  The patient's history has been reviewed, patient examined, no change in status, stable for surgery.  I have reviewed the patient's chart and labs.  Questions were answered to the patient's satisfaction.    ICD Criteria  Current LVEF:25%. Within 12 months prior to implant: Yes   Heart failure history: Yes, Class II  Cardiomyopathy history: Yes, Non-Ischemic Cardiomyopathy.  Atrial Fibrillation/Atrial Flutter: No.  Ventricular tachycardia history: No.  Cardiac arrest history: No.  History of syndromes with risk of sudden death: No.  Previous ICD: No.  Current ICD indication: Primary  PPM indication: LBBB planned for CRT  Class I or II Bradycardia indication present: No  Beta Blocker therapy for 3 or more months: Yes, prescribed.   Ace Inhibitor/ARB therapy for 3 or more months: Yes, prescribed.   The patient's chart has been reviewed and they meet criteria for ICD implant.  He has LBBB QRS with QRS > 150 msec and therefore has a class I indication for CRT.    I have had a thorough discussion with the patient reviewing options.  The patient has had opportunities to ask questions and have them answered. The patient and I have decided together through the [place] Support Tool to proceed with BIV ICD at this time.  Risks, benefits, alternatives to BiV ICD implantation were discussed in detail with the patient today. The patient  understands that the risks include but are not limited to bleeding, infection, pneumothorax, perforation, tamponade, vascular damage,  renal failure, MI, stroke, death, inappropriate shocks, and lead dislodgement and wishes to proceed.    Anthony Daugherty

## 2019-06-02 ENCOUNTER — Encounter (HOSPITAL_COMMUNITY): Payer: Self-pay | Admitting: Internal Medicine

## 2019-06-02 ENCOUNTER — Ambulatory Visit (HOSPITAL_COMMUNITY): Payer: Medicare Other

## 2019-06-02 DIAGNOSIS — I110 Hypertensive heart disease with heart failure: Secondary | ICD-10-CM | POA: Diagnosis not present

## 2019-06-02 DIAGNOSIS — I5022 Chronic systolic (congestive) heart failure: Secondary | ICD-10-CM

## 2019-06-02 DIAGNOSIS — I428 Other cardiomyopathies: Secondary | ICD-10-CM

## 2019-06-02 DIAGNOSIS — Z006 Encounter for examination for normal comparison and control in clinical research program: Secondary | ICD-10-CM | POA: Diagnosis not present

## 2019-06-02 LAB — BASIC METABOLIC PANEL
Anion gap: 9 (ref 5–15)
BUN: 11 mg/dL (ref 8–23)
CO2: 21 mmol/L — ABNORMAL LOW (ref 22–32)
Calcium: 9 mg/dL (ref 8.9–10.3)
Chloride: 109 mmol/L (ref 98–111)
Creatinine, Ser: 0.87 mg/dL (ref 0.61–1.24)
GFR calc Af Amer: >60 mL/min (ref >60)
GFR calc non Af Amer: >60 mL/min (ref >60)
Glucose, Bld: 103 mg/dL — ABNORMAL HIGH (ref 70–99)
Potassium: 3.8 mmol/L (ref 3.5–5.1)
Sodium: 139 mmol/L (ref 135–145)

## 2019-06-02 NOTE — Discharge Instructions (Signed)
After Your ICD °(Implantable Cardiac Defibrillator) ° ° °• You have a St. Jude ICD ° °• Do not lift your arm above shoulder height for 1 week after your procedure. After 7 days, you may progress as below.  ° ° ° Wednesday June 09, 2019  Thursday June 10, 2019 Friday June 11, 2019 Saturday June 12, 2019  ° °• Do not lift, push, pull, or carry anything over 10 pounds with the affected arm until 6 weeks (Wednesday July 14, 2019 ) after your procedure.  ° °• Do not drive until you have been seen for your wound check, or as long as instructed by your healthcare provider.  ° °• Monitor your defibrillator site for redness, swelling, and drainage. Call the device clinic at [phone] if you experience these symptoms or fever/chills. ° °• If your incision is sealed with Steri-strips or staples, you may shower 7 days after your procedure. Do not remove the steri-strips or let the shower hit directly on your site. You may wash around your site with soap and water. If your incision is closed with Dermabond/Surgical glue. You may shower 1 day after your pacemaker implant and wash around the site with soap and water. Avoid lotions, ointments, or perfumes over your incision until it is well-healed. ° °• You may use a hot tub or a pool AFTER your wound check appointment if the incision is completely closed. ° °• Your ICD  may be MRI compatible. This will be discussed at your next office visit/wound check.  ° °• Your ICD is designed to protect you from life threatening heart rhythms. Because of this, you may receive a shock.  ° °o 1 shock with no symptoms:  Call the office during business hours. °o 1 shock with symptoms (chest pain, chest pressure, dizziness, lightheadedness, shortness of breath, overall feeling unwell):  Call 911. °o If you experience 2 or more shocks in 24 hours:  Call 911. °o If you receive a shock, you should not drive for 6 months per the [place] DMV IF you receive appropriate therapy from your  ICD.  ° °• ICD Alerts:  Some alerts are vibratory and others beep. These are NOT emergencies. Please call our office to let us know. If this occurs at night or on weekends, it can wait until the next business day. Send a remote transmission. ° °• If your device is capable of reading fluid status (for heart failure), you will be offered monthly monitoring to review this with you.  ° °• Remote monitoring is used to monitor your ICD from home. This monitoring is scheduled every 91 days by our office. It allows us to keep an eye on the functioning of your device to ensure it is working properly. You will routinely see your Electrophysiologist annually (more often if necessary).  ° ° °Cardioverter Defibrillator Implantation, Care After °This sheet gives you information about how to care for yourself after your procedure. Your health care provider may also give you more specific instructions. If you have problems or questions, contact your health care provider. °What can I expect after the procedure? °After the procedure, it is common to have: °· Some pain. It may last a few days. °· A slight bump over the skin where the device was placed. Sometimes, it is possible to feel the device under the skin. This is normal. °· During the months and years after your procedure, your health care provider will check the device, the leads, and the battery every few months.   Eventually, when the battery is low, the device will be replaced. °· You should receive your defibrillator ID card for your new device in the next 4-8 weeks.  °Follow these instructions at home: °Medicines °· Take over-the-counter and prescription medicines only as told by your health care provider. °· If you were prescribed an antibiotic medicine, take it as told by your health care provider. Do not stop taking the antibiotic even if you start to feel better. °Incision care ° °  ° °  °· Follow instructions from your health care provider about how to take care of  your incision area. Make sure you: °? Leave stitches (sutures), skin glue, or adhesive strips in place. These skin closures may need to stay in place for 2 weeks or longer. If adhesive strip edges start to loosen and curl up, you may trim the loose edges. Do not remove adhesive strips completely unless your health care provider tells you to do that. °· Check your incision area every day for signs of infection. Check for: °? More redness, swelling, or pain. °? More fluid or blood. °? Warmth. °? Pus or a bad smell. °· Do not use lotions or ointments near the incision area unless told by your health care provider. °· Keep the incision area clean and dry for 7 days after the procedure or for as long as told by your health care provider. It takes several weeks for the incision site to heal completely. °· Do not take baths, swim, or use a hot tub until your health care provider approves. °Activity °· Try to walk a little every day. Exercising is important after this procedure. Also, use your shoulder on the side of the defibrillator in daily tasks that do not require a lot of motion. °· For at least 1 week: °? Do not lift your upper arm above your shoulders. This means no tennis, golf, or swimming for this period of time. If you tend to sleep with your arm above your head, use a restraint to prevent this during sleep. °· For at least 6 weeks: °? Avoid sudden jerking, pulling, or chopping movements that pull your upper arm far away from your body. °· Ask your health care provider when you may go back to work. °· Check with your health care provider before you start to drive or play sports. °Electric and magnetic fields °· Tell all health care providers that you have a defibrillator. This may prevent them from giving you an MRI scan because strong magnets are used for that test. °· If you must pass through a metal detector, quickly walk through it. Do not stop under the detector, and do not stand near it. °· Avoid places or  objects that have a strong electric or magnetic field, including: °? Airport security gates. At the airport, let officials know that you have a defibrillator. Your defibrillator ID card will let you be checked in a way that is safe for you and will not damage your defibrillator. Also, do not let a security person wave a magnetic wand near your defibrillator. That can make it stop working. °? Power plants. °? Large electrical generators. °? Anti-theft systems or electronic article surveillance (EAS). °? Radiofrequency transmission towers, such as cell phone and radio towers. °· Do not use amateur (ham) radio equipment or electric (arc) welding torches. Some devices are safe to use if held at least 12 inches (30 cm) from your defibrillator. These include power tools, lawn mowers, and speakers. If you   are unsure if something is safe to use, ask your health care provider. °· Do not use MP3 player headphones. They have magnets. °· You may safely use electric blankets, heating pads, computers, and microwave ovens. °· When using your cell phone, hold it to the ear that is on the opposite side from the defibrillator. Do not leave your cell phone in a pocket over the defibrillator. °General instructions °· Follow diet instructions from your health care provider, if this applies. °· Always keep your defibrillator ID card with you. The card should list the implant date, device model, and manufacturer. Consider wearing a medical alert bracelet or necklace. °· Have your defibrillator checked every 3-6 months or as often as told by your health care provider. Most defibrillators last for 4-8 years. °· Keep all follow-up visits as told by your health care provider. This is important for your health care provider to make sure your chest is healing the way it should. Ask your health care provider when you should come back to have your stitches or staples taken out. °Contact a health care provider if: °· You gain weight  suddenly. °· Your legs or feet swell more than they have before. °· It feels like your heart is fluttering or skipping beats (heart palpitations). °· You have more redness, swelling, or pain around your incision. °· You have more fluid or blood coming from your incision. °· Your incision feels warm to the touch. °· You have pus or a bad smell coming from your incision. °· You have a fever. °Get help right away if: °· You have chest pain. °· You feel more than one shock. °· You feel more short of breath than you have felt before. °· You feel more light-headed than you have felt before. °· Your incision starts to open up. °This information is not intended to replace advice given to you by your health care provider. Make sure you discuss any questions you have with your health care provider. ° ° ° ° ° ° °

## 2019-06-02 NOTE — Discharge Summary (Addendum)
ELECTROPHYSIOLOGY PROCEDURE DISCHARGE SUMMARY    Patient ID: Anthony Daugherty,  MRN: [id number], DOB/AGE: Mar 07, 1946 73 y.o.  Admit date: 06/01/2019 Discharge date: 06/02/2019  Primary Care Physician: Tammi Sou, MD  Primary Cardiologist: Fransico Him, MD  Electrophysiologist: Dr. Rayann Heman  Primary Diagnosis:  Chronic systolic CHF (class 2)  Secondary Diagnosis: Moderate AI CAD  Allergies  Allergen Reactions  . Penicillins Swelling    Did it involve swelling of the face/tongue/throat, SOB, or low BP? No Did it involve sudden or severe rash/hives, skin peeling, or any reaction on the inside of your mouth or nose? No Did you need to seek medical attention at a hospital or doctor's office? No When did it last happen? ~10 years ago If all above answers are "NO", may proceed with cephalosporin use.      Procedures This Admission:  1.  Implantation of a St. Jude BiV ICD on 06/01/2019 by Dr. Rayann Heman.  The patient received a St Jude model number [id number] ICD with model number [id number] right atrial lead, 7122Q-65 right ventricular lead, and 1458Q-86 left ventricular lead.  DFT's were deferred at time of implant. There were no immediate post procedure complications. 2.  CXR on 06/02/19 demonstrated no pneumothorax status post device implantation.  Brief HPI: Anthony Daugherty is a 73 y.o. male was referred to electrophysiology in the outpatient setting for consideration of ICD implantation.  Past medical history includes above.  The patient has persistent LV dysfunction despite guideline directed therapy.  Risks, benefits, and alternatives to ICD implantation were reviewed with the patient who wished to proceed.   Hospital Course:  The patient was admitted and underwent implantation of a St. Jude BiV ICD with details as outlined above. They were monitored on telemetry overnight which demonstrated A-V dual pacing. ECG with stable QRS ~150 ms. V-V optimization in lab  showed best QRS was 146 ms with LV first by 15 ms.  Left chest was without hematoma or ecchymosis.  The device was interrogated and found to be functioning normally.  CXR was obtained and demonstrated no pneumothorax status post device implantation..  Wound care, arm mobility, and restrictions were reviewed with the patient.  The patient was examined and considered stable for discharge to home.   The patient's discharge medications include an ACE-I/ARB/ARNI Delene Loll) and beta blocker (Coreg).    Physical Exam: Vitals:   06/01/19 1932 06/01/19 2147 06/02/19 0011 06/02/19 0516  BP: 97/63 99/62 (!) 152/68 125/70  Pulse: 69  64 64  Resp: 18  18 16   Temp: 97.9 F (36.6 C)  98.1 F (36.7 C) 98.1 F (36.7 C)  TempSrc: Oral  Oral Oral  SpO2: 97%  98% 96%  Weight:    77.2 kg  Height:        GEN- The patient is well appearing, alert and oriented x 3 today.   HEENT: normocephalic, atraumatic; sclera clear, conjunctiva pink; hearing intact; oropharynx clear; neck supple, no JVP Lymph- no cervical lymphadenopathy Lungs- Clear to ausculation bilaterally, normal work of breathing.  No wheezes, rales, rhonchi Heart- Regular rate and rhythm, no murmurs, rubs or gallops, PMI not laterally displaced GI- soft, non-tender, non-distended, bowel sounds present, no hepatosplenomegaly Extremities- no clubbing, cyanosis, or edema; DP/PT/radial pulses 2+ bilaterally MS- no significant deformity or atrophy Skin- warm and dry, no rash or lesion. ICD site stable Psych- euthymic mood, full affect Neuro- strength and sensation are intact   Labs:   Lab Results  Component Value Date  WBC 3.8 05/28/2019   HGB 13.2 05/28/2019   HCT 37.9 05/28/2019   MCV 91 05/28/2019   PLT 205 05/28/2019    Recent Labs  Lab 06/02/19 0537  NA 139  K 3.8  CL 109  CO2 21*  BUN 11  CREATININE 0.87  CALCIUM 9.0  GLUCOSE 103*    Discharge Medications:  Allergies as of 06/02/2019      Reactions   Penicillins  Swelling   Did it involve swelling of the face/tongue/throat, SOB, or low BP? No Did it involve sudden or severe rash/hives, skin peeling, or any reaction on the inside of your mouth or nose? No Did you need to seek medical attention at a hospital or doctor's office? No When did it last happen? ~10 years ago If all above answers are "NO", may proceed with cephalosporin use.      Medication List    TAKE these medications   acetaminophen 500 MG tablet Commonly known as: TYLENOL Take 500-1,000 mg by mouth every 6 (six) hours as needed (for pain.).   aspirin 81 MG tablet Take 81 mg by mouth daily.   atorvastatin 40 MG tablet Commonly known as: LIPITOR Take 1 tablet (40 mg total) by mouth daily.   carvedilol 25 MG tablet Commonly known as: COREG Take 1 tablet (25 mg total) by mouth 2 (two) times daily.   colchicine 0.6 MG tablet TAKE 2 TABLETS AT ONSET OF GOUT PAIN, REPEAT IN 1 HOUR, THEN 1 TABLETTWICE/DAY TIL RESOLVED What changed:   how much to take  how to take this  when to take this  reasons to take this   Entresto 49-51 MG Generic drug: sacubitril-valsartan TAKE  (1)  TABLET TWICE A DAY. What changed: See the new instructions.   FLUoxetine 20 MG capsule Commonly known as: PROZAC Take 20 mg by mouth daily.   spironolactone 25 MG tablet Commonly known as: ALDACTONE Take 0.5 tablets (12.5 mg total) by mouth daily.       Disposition:   Follow-up Information    [name], MD Follow up on 09/02/2019.   Specialty: Cardiology Why: At 2 pm for 3 month post ICD check Contact information: [address] [place] [address] [phone]        [facility] Follow up on 06/16/2019.   Why: at 2 pm for post ICD check Contact information: [place] [phone]          Duration of Discharge Encounter: Greater than 30 minutes including  physician time.  Signed, Haaris Paris, PA-C  06/02/2019 8:45 AM   Device interrogation is reviewed and normal  DC to home with routine wound care and follow-up  [name] MD, [facility]

## 2019-06-03 ENCOUNTER — Encounter: Payer: Self-pay | Admitting: Family Medicine

## 2019-06-07 ENCOUNTER — Telehealth: Payer: Self-pay | Admitting: Physician Assistant

## 2019-06-07 ENCOUNTER — Other Ambulatory Visit: Payer: Self-pay | Admitting: Family Medicine

## 2019-06-07 NOTE — Telephone Encounter (Signed)
   Reviewing charts for tomorrow - after last OV, I recommended f/u in 6 weeks but also referred to EP. It looks like in the meantime he has seen EP and they proceeded with CRT-D implantation.  Hospital labs showed stable potassium and creatinine. If Anthony Daugherty is feeling fine at this point and blood pressures are controlled at home, I would be fine with him cancelling tomorrow's visit and keeping f/u with Dr. Radford Pax as scheduled in 07/2019. I'm happy to keep this appointment if they want to, but wanted to offer him the opportunity to defer since he was just recently re-evaluated in person while in the hospital by the EP folks. Just let me know. Juanita Streight PA-C

## 2019-06-07 NOTE — Telephone Encounter (Signed)
Call placed to pt re: phone note below. Pt and wife both states things are going good and they will cancel appt tomorrow, 06/08/2019, and keep the virtual appt with Dr. Radford Pax 07/28/2019.

## 2019-06-08 ENCOUNTER — Telehealth: Payer: Medicare Other | Admitting: Physician Assistant

## 2019-06-16 ENCOUNTER — Ambulatory Visit (INDEPENDENT_AMBULATORY_CARE_PROVIDER_SITE_OTHER): Payer: Medicare Other | Admitting: *Deleted

## 2019-06-16 ENCOUNTER — Other Ambulatory Visit: Payer: Self-pay

## 2019-06-16 DIAGNOSIS — I429 Cardiomyopathy, unspecified: Secondary | ICD-10-CM | POA: Diagnosis not present

## 2019-06-16 DIAGNOSIS — I5022 Chronic systolic (congestive) heart failure: Secondary | ICD-10-CM | POA: Diagnosis not present

## 2019-06-16 LAB — CUP PACEART INCLINIC DEVICE CHECK
Brady Statistic RA Percent Paced: 38 %
Brady Statistic RV Percent Paced: 99 %
Date Time Interrogation Session: 20201202180431
Implantable Lead Implant Date: 20201117
Implantable Lead Implant Date: 20201117
Implantable Lead Implant Date: 20201117
Implantable Lead Location: 753858
Implantable Lead Location: 753859
Implantable Lead Location: 753860
Implantable Pulse Generator Implant Date: 20201117
Lead Channel Pacing Threshold Amplitude: 0.5 V
Lead Channel Pacing Threshold Amplitude: 0.5 V
Lead Channel Pacing Threshold Amplitude: 0.5 V
Lead Channel Pacing Threshold Pulse Width: 0.5 ms
Lead Channel Pacing Threshold Pulse Width: 0.5 ms
Lead Channel Pacing Threshold Pulse Width: 0.5 ms
Lead Channel Sensing Intrinsic Amplitude: 1.2 mV
Lead Channel Sensing Intrinsic Amplitude: 11.8 mV
Pulse Gen Serial Number: 111012743

## 2019-06-16 NOTE — Patient Instructions (Signed)
Call office if swelling increases at incision site. Call for redness, drainage or fever.

## 2019-06-16 NOTE — Progress Notes (Signed)
Wound check appointment. Steri-strips removed. Wound without redness or edema. Incision edges approximated, wound well healed. Normal device function. Thresholds, sensing, and impedances consistent with implant measurements. Device programmed at 3.5V for extra safety margin until 3 month visit. Histogram distribution appropriate for patient and level of activity. No mode switches or ventricular arrhythmias noted. Scheduled for remote monitoring and next remote scheduled for 09/02/19.  Patient educated about wound care, arm mobility, lifting restrictions, shock plan. ROV with Dr Rayann Heman on 09/02/19.

## 2019-07-12 ENCOUNTER — Telehealth: Payer: Self-pay | Admitting: *Deleted

## 2019-07-12 NOTE — Telephone Encounter (Signed)

## 2019-07-27 NOTE — Progress Notes (Signed)
Virtual Visit via Video Note   This visit type was conducted due to national recommendations for restrictions regarding the COVID-19 Pandemic (e.g. social distancing) in an effort to limit this patient's exposure and mitigate transmission in our community.  Due to his co-morbid illnesses, this patient is at least at moderate risk for complications without adequate follow up.  This format is felt to be most appropriate for this patient at this time.  All issues noted in this document were discussed and addressed.  A limited physical exam was performed with this format.  Please refer to the patient's chart for his consent to telehealth for [facility]   Evaluation Performed:  Follow-up visit  This visit type was conducted due to national recommendations for restrictions regarding the COVID-19 Pandemic (e.g. social distancing).  This format is felt to be most appropriate for this patient at this time.  All issues noted in this document were discussed and addressed.  No physical exam was performed (except for noted visual exam findings with Video Visits).  Please refer to the patient's chart (MyChart message for video visits and phone note for telephone visits) for the patient's consent to telehealth for [facility].  Date:  07/28/2019   ID:  JAKAVIOUS STEFFAN, DOB [birth year], MRN [id number]  Patient Location:  [place]  Provider location:   [place]  PCP:  Tammi Sou, MD  Cardiologist:  Fransico Him, MD  Electrophysiologist:  None   Chief Complaint:  DCM and AI  History of Present Illness:    Anthony Daugherty is a 74 y.o. male who presents via audio/video conferencing for a telehealth visit today.    This is a 74yo male with a history of aortic stenosis and AR with dilated aortic root.  He apparently had an echo 07/2017 showing mild aortic stenosis and mild to moderate AR and aortic root 85mm.  He also has a history of HTN, hyperlipidemia, nonischemic DCM with EF 40-45 by  cath and echo in 2012 and ASCAD with 80% OM lesion medically managed.    Repeat echo 08/2018 showed moderate AR with aortic root 69mm.  Unfortunately his echo also shows that his LVF has significantly declined with EF 20-25% with mildly dilated LV and diffuse HK.  There was severe LAE and moderate RAE and mild pulmonary HTN. At his initial OV in [id number], his Bystolic was changed to Carvedilol and he was continued on ARB.    He underwent cath 12/2018 showing 95% pRCA, 75% OM1 and 50% prox to mid LAD.  He underwent attempted PCI of the RCA but unable to cross the lesion and medical therapy was recommended.  It was felt that his CM was mixed ICM/NICM with possible ETOH contribution as well.  He has seen my extenders several times trying to titrate HF meds but has been limited by soft Bps.  He was on Entresto 49/51mg  BID, Carvedilol 25mg  BID and repeat echo 04/2019 showed EF 25-30% with mils AS and moderate to severe AI.  There was no M spike on SPEP/UPEP and TSH and ferritin were normal.  He was referred to EP and underwent [employer] BiV ICD on 06/01/2019.    He is here today for followup and is doing well.  He denies any chest pain or pressure, SOB, DOE, PND, orthopnea, LE edema, dizziness, palpitations or syncope. He is compliant with his meds and is tolerating meds with no SE.    The patient does not have symptoms concerning for COVID-19 infection (fever,  chills, cough, or new shortness of breath).    Prior CV studies:   The following studies were reviewed today:  2D echo 04/2019  Past Medical History:  Diagnosis Date  . Aortic regurgitation    Mild-mod on echo 07/2017.  Moderate 08/2018.  Moderate-severe on echo 04/2019.  Marland Kitchen Aortic stenosis, mild   . Arthritis    L-Spine  . Ascending aorta dilatation (HCC)   . CARDIAC MURMUR [id number]; [id number]   Systolic and diastolic as of 123456.  . Cardiomyopathy ([place]) 10/12/2010   Global hypokinesis, EF 40-45% by cath and echo (2012 echo and 2019 echo).  Feb  2020 EF 20-25%. - mixed ICM/NICM (alcohol and CAD)  October 2020 EF 25-30%.  . Chronic combined systolic and diastolic CHF (congestive heart failure) (HCC)    max med mgmt, plus CRT-D placed 05/2019.  Marland Kitchen Coronary artery disease 11/16/2010   a. 2012 - 80% OM lesion. b. Cath 12/2018 - showing 95% prox RCA, 75% OM1, 50% prox-mid LAD. Unsuccessful PCI to 100% chronically occluded RCA summer 2020.  . Dilated aortic root ([place]) 10/21/2018  . Diverticulosis   . Erectile dysfunction 08/16/2011  . ESSENTIAL HYPERTENSION 07/19/2010   Qualifier: Diagnosis of  By: Anitra Lauth M.D., Brien Few   . GAD (generalized anxiety disorder)    managed by Dr. Toy Care with prozac 20mg  qd  . Gout    Usually Right great toe; colchicine helps well  . Habitual alcohol use   . Hx of colonoscopy 2004; 2014   Initial was normal; 2014-polypectomy and diverticulosis.  Recall 2019.  Marland Kitchen Hyperlipidemia 10/23/2012  . LBBB (left bundle branch block)    CRT-D placed 05/2019  . Low back strain 10/24/2013  . Obesity, Class I, BMI 30-34.9   . Thyroid nodule 04/23/2013  . TOBACCO ABUSE 08/13/2010   Past Surgical History:  Procedure Laterality Date  . BIV ICD INSERTION CRT-D N/A 06/01/2019   Procedure: BIV ICD INSERTION CRT-D;  Surgeon: [name], MD;  Location: [place] [facility];  Service: Cardiovascular;  Laterality: N/A;  . CARDIAC CATHETERIZATION  10/12/10   diagnostic only: 1 vessel CAD with TIMI 3 flow, EF 40-45%.  Mild AS.  Medical mgmt of CAD.  Marland Kitchen COLONOSCOPY W/ POLYPECTOMY  10/06/12   Diverticulosis and tubular adenoma w/out high grade dysplasia.  Repeat 5 yrs (Dr. Deatra Ina).  . CORONARY ATHERECTOMY N/A 01/08/2019   100% chronic RCA occlusion impossible to clear.  Med mgmt. Procedure: CORONARY ATHERECTOMY;  Surgeon: Sherren Mocha, MD;  Location: [place] [facility];  Service: Cardiovascular;  Laterality: N/A;  . RIGHT HEART CATH AND CORONARY ANGIOGRAPHY N/A 12/24/2018   95% RCA occlusion, 75% obtuse marginal occlusion (3V dz).  Procedure: RIGHT HEART CATH AND CORONARY ANGIOGRAPHY;  Surgeon: Lorretta Harp, MD;  Location: [place] [facility];  Service: Cardiovascular;  Laterality: N/A;  . TRANSTHORACIC ECHOCARDIOGRAM  09/2010; 07/2017; 08/2018   2012:  EF 40-45%, global hypokinesis, mild AS.  07/2017: no interval change except mild/mod aortic regurg.  08/2018 EF 20-25%, global LV hypokinesis, diastolic dyfxn, biatrial enlargemt, mod AR, aortic root 3.8 cm.     Current Meds  Medication Sig  . acetaminophen (TYLENOL) 500 MG tablet Take 500-1,000 mg by mouth every 6 (six) hours as needed (for pain.).  Marland Kitchen aspirin 81 MG tablet Take 81 mg by mouth daily.    Marland Kitchen atorvastatin (LIPITOR) 40 MG tablet Take 1 tablet (40 mg total) by mouth daily.  . carvedilol (COREG) 25 MG tablet Take 1 tablet (25 mg total)  by mouth 2 (two) times daily.  . colchicine 0.6 MG tablet TAKE 2 TABLETS AT ONSET OF GOUT PAIN, REPEAT IN 1 HOUR, THEN 1 TABLETTWICE/DAY TIL RESOLVED  . ENTRESTO 49-51 MG TAKE  (1)  TABLET TWICE A DAY. (Patient taking differently: Take 1 tablet by mouth 2 (two) times daily. )  . FLUoxetine (PROZAC) 20 MG capsule Take 20 mg by mouth daily.   Marland Kitchen spironolactone (ALDACTONE) 25 MG tablet Take 0.5 tablets (12.5 mg total) by mouth daily.     Allergies:   Penicillins   Social History   Tobacco Use  . Smoking status: Never Smoker  . Smokeless tobacco: Current User    Types: Chew  . Tobacco comment: chews tobacco  Substance Use Topics  . Alcohol use: Yes    Alcohol/week: 28.0 standard drinks    Types: 28 Cans of beer per week  . Drug use: No     Family Hx: The patient's family history includes Alzheimer's disease in his mother; COPD in his father; Congenital heart disease in his brother; Heart failure in his father. There is no history of Colon cancer, Esophageal cancer, Stomach cancer, or Rectal cancer.  ROS:   Please see the history of present illness.     All other systems reviewed and are negative.   Labs/Other Tests  and Data Reviewed:    Recent Labs: 12/21/2018: TSH 2.350 04/26/2019: ALT 9 05/28/2019: Hemoglobin 13.2; Platelets 205 06/02/2019: BUN 11; Creatinine, Ser 0.87; Potassium 3.8; Sodium 139   Recent Lipid Panel Lab Results  Component Value Date/Time   CHOL 141 04/26/2019 09:12 AM   TRIG 59 04/26/2019 09:12 AM   TRIG 105 05/10/2008 12:00 AM   HDL 72 04/26/2019 09:12 AM   CHOLHDL 2.0 04/26/2019 09:12 AM   CHOLHDL 3 08/03/2018 10:25 AM   LDLCALC 57 04/26/2019 09:12 AM   LDLCALC 101 05/10/2008 12:00 AM    Wt Readings from Last 3 Encounters:  07/28/19 173 lb (78.5 kg)  06/02/19 170 lb 3.2 oz (77.2 kg)  05/07/19 170 lb (77.1 kg)     Objective:    Vital Signs:  BP (!) 118/57   Pulse 73   Ht 5\' 5"  (1.651 m)   Wt 173 lb (78.5 kg)   BMI 28.79 kg/m    CONSTITUTIONAL:  Well nourished, well developed male in no acute distress.  EYES: anicteric MOUTH: oral mucosa is pink RESPIRATORY: Normal respiratory effort, symmetric expansion CARDIOVASCULAR: No peripheral edema SKIN: No rash, lesions or ulcers MUSCULOSKELETAL: no digital cyanosis NEURO: Cranial Nerves II-XII grossly intact, moves all extremities PSYCH: Intact judgement and insight.  A&O x 3, Mood/affect appropriate   ASSESSMENT & PLAN:    1.  Severe mixed DCM -EF on last echo 04/2019 was 25-30% despite maximum tolerated HF meds -now s/p BiV ICD in Nov 2020 -will repeat echo to see if LVF has improved in April when we reassess his AV  2.  Chronic combined systolic/diastolic CHF -he is NYHA class 2a -SOB is much improved and he denies any LE edema -continue Entresto 49-51mg  BID, carvedilol 25mg  BID, spiro 12.5mg  daily -creatinine was stable at 0.87 on 06/02/2019 and K+ 3.8  3.  Aortic valve disease -2D echo 04/2019 with moderate AR visually but by PHT 516msec was calculated as mild but the jet is eccentric -repeat 2D echo 10/2019  4.  Mild ascending aortic and aortic root aneurysms -2D echo 04/2019 showed 4.3cm aortic  root and 4.4cm Ascending aorta -will get Chest and abdominal CTA to  assess entire aorta  5.  ASCAD - cath 12/2018 showing 95% pRCA, 75% OM1 and 50% prox to mid LAD.  He underwent attempted PCI of the RCA but unable to cross the lesion and medical therapy was recommended. -he has not had any angina -continue ASA, BB and statin  6.  HLD -LDL goal < 70 -continue atorvastatin 40mg  daily -LDL was 57 in Oct 2020  COVID-19 Education: The signs and symptoms of COVID-19 were discussed with the patient and how to seek care for testing (follow up with PCP or arrange E-visit).  The importance of social distancing was discussed today.  Patient Risk:   After full review of this patient's clinical status, I feel that they are at least moderate risk at this time.  Time:   Today, I have spent 20 minutes directly with the patient on telemedicine discussing medical problems including CAD, HLD, DCM, CHF.  We also reviewed the symptoms of COVID 19 and the ways to protect against contracting the virus with telehealth technology.  I spent an additional 10 minutes reviewing patient's chart including cardiac cath, 2D echo, labs.  Medication Adjustments/Labs and Tests Ordered: Current medicines are reviewed at length with the patient today.  Concerns regarding medicines are outlined above.  Tests Ordered: No orders of the defined types were placed in this encounter.  Medication Changes: No orders of the defined types were placed in this encounter.   Disposition:  Follow up in 6 month(s)  Signed, Fransico Him, MD  07/28/2019 8:04 AM    [facility]

## 2019-07-28 ENCOUNTER — Encounter: Payer: Self-pay | Admitting: Cardiology

## 2019-07-28 ENCOUNTER — Telehealth: Payer: Self-pay

## 2019-07-28 ENCOUNTER — Telehealth (INDEPENDENT_AMBULATORY_CARE_PROVIDER_SITE_OTHER): Payer: Medicare PPO | Admitting: Cardiology

## 2019-07-28 ENCOUNTER — Other Ambulatory Visit: Payer: Self-pay

## 2019-07-28 VITALS — BP 118/57 | HR 73 | Ht 65.0 in | Wt 173.0 lb

## 2019-07-28 DIAGNOSIS — I712 Thoracic aortic aneurysm, without rupture: Secondary | ICD-10-CM | POA: Diagnosis not present

## 2019-07-28 DIAGNOSIS — I5022 Chronic systolic (congestive) heart failure: Secondary | ICD-10-CM

## 2019-07-28 DIAGNOSIS — I7121 Aneurysm of the ascending aorta, without rupture: Secondary | ICD-10-CM

## 2019-07-28 DIAGNOSIS — I2510 Atherosclerotic heart disease of native coronary artery without angina pectoris: Secondary | ICD-10-CM

## 2019-07-28 DIAGNOSIS — I255 Ischemic cardiomyopathy: Secondary | ICD-10-CM | POA: Diagnosis not present

## 2019-07-28 DIAGNOSIS — E7800 Pure hypercholesterolemia, unspecified: Secondary | ICD-10-CM | POA: Diagnosis not present

## 2019-07-28 DIAGNOSIS — I351 Nonrheumatic aortic (valve) insufficiency: Secondary | ICD-10-CM

## 2019-07-28 NOTE — Addendum Note (Signed)
Addended by: Antonieta Iba on: 07/28/2019 08:50 AM   Modules accepted: Orders

## 2019-07-28 NOTE — Telephone Encounter (Signed)
Left message for patient to call back to schedule echocardiogram.

## 2019-07-28 NOTE — Addendum Note (Signed)
Addended by: Antonieta Iba on: 07/28/2019 08:49 AM   Modules accepted: Orders

## 2019-07-28 NOTE — Patient Instructions (Signed)
Medication Instructions:  Your physician recommends that you continue on your current medications as directed. Please refer to the Current Medication list given to you today.  *If you need a refill on your cardiac medications before your next appointment, please call your pharmacy*  Testing/Procedures: Your physician has requested that you have an echocardiogram. Echocardiography is a painless test that uses sound waves to create images of your heart. It provides your doctor with information about the size and shape of your heart and how well your heart's chambers and valves are working. This procedure takes approximately one hour. There are no restrictions for this procedure.  Follow-Up: At [facility], you and your health needs are our priority.  As part of our continuing mission to provide you with exceptional heart care, we have created designated Provider Care Teams.  These Care Teams include your primary Cardiologist (physician) and Advanced Practice Providers (APPs -  Physician Assistants and Nurse Practitioners) who all work together to provide you with the care you need, when you need it.  Your next appointment:   6 month(s)  The format for your next appointment:   Either In Person or Virtual  Provider:   You may see Fransico Him, MD or one of the following Advanced Practice Providers on your designated Care Team:    Melina Copa, PA-C  Ermalinda Barrios, PA-C

## 2019-08-01 ENCOUNTER — Encounter: Payer: Self-pay | Admitting: Family Medicine

## 2019-08-05 ENCOUNTER — Encounter: Payer: Medicare Other | Admitting: Family Medicine

## 2019-09-02 ENCOUNTER — Encounter: Payer: Self-pay | Admitting: Internal Medicine

## 2019-09-02 ENCOUNTER — Ambulatory Visit (INDEPENDENT_AMBULATORY_CARE_PROVIDER_SITE_OTHER): Payer: Medicare PPO | Admitting: *Deleted

## 2019-09-02 ENCOUNTER — Telehealth (INDEPENDENT_AMBULATORY_CARE_PROVIDER_SITE_OTHER): Payer: Medicare PPO | Admitting: Internal Medicine

## 2019-09-02 VITALS — Ht 66.0 in | Wt 170.0 lb

## 2019-09-02 DIAGNOSIS — I255 Ischemic cardiomyopathy: Secondary | ICD-10-CM

## 2019-09-02 DIAGNOSIS — I2510 Atherosclerotic heart disease of native coronary artery without angina pectoris: Secondary | ICD-10-CM

## 2019-09-02 DIAGNOSIS — I5022 Chronic systolic (congestive) heart failure: Secondary | ICD-10-CM | POA: Diagnosis not present

## 2019-09-02 LAB — CUP PACEART REMOTE DEVICE CHECK
Battery Remaining Longevity: 76 mo
Battery Remaining Percentage: 95 %
Battery Voltage: 2.99 V
Brady Statistic AP VP Percent: 40 %
Brady Statistic AP VS Percent: 1 %
Brady Statistic AS VP Percent: 59 %
Brady Statistic AS VS Percent: 1 %
Brady Statistic RA Percent Paced: 39 %
Brady Statistic RV Percent Paced: 99 %
Date Time Interrogation Session: 20210218020004
HighPow Impedance: 69 Ohm
Implantable Lead Implant Date: 20201117
Implantable Lead Implant Date: 20201117
Implantable Lead Implant Date: 20201117
Implantable Lead Location: 753858
Implantable Lead Location: 753859
Implantable Lead Location: 753860
Implantable Pulse Generator Implant Date: 20201117
Lead Channel Impedance Value: 400 Ohm
Lead Channel Impedance Value: 400 Ohm
Lead Channel Impedance Value: 690 Ohm
Lead Channel Pacing Threshold Amplitude: 0.5 V
Lead Channel Pacing Threshold Amplitude: 0.75 V
Lead Channel Pacing Threshold Amplitude: 0.875 V
Lead Channel Pacing Threshold Pulse Width: 0.5 ms
Lead Channel Pacing Threshold Pulse Width: 0.5 ms
Lead Channel Pacing Threshold Pulse Width: 0.5 ms
Lead Channel Sensing Intrinsic Amplitude: 1.8 mV
Lead Channel Sensing Intrinsic Amplitude: 11.8 mV
Lead Channel Setting Pacing Amplitude: 1.375
Lead Channel Setting Pacing Amplitude: 1.5 V
Lead Channel Setting Pacing Amplitude: 3.5 V
Lead Channel Setting Pacing Pulse Width: 0.5 ms
Lead Channel Setting Pacing Pulse Width: 0.5 ms
Lead Channel Setting Sensing Sensitivity: 0.5 mV
Pulse Gen Serial Number: 111012743

## 2019-09-02 NOTE — Progress Notes (Signed)
Electrophysiology TeleHealth Note   Due to national recommendations of social distancing due to COVID 19, an audio/video telehealth visit is felt to be most appropriate for this patient at this time.  See MyChart message from today for the patient's consent to telehealth for [facility].  Date:  09/02/2019   ID:  Anthony Daugherty, DOB October 03, 1945, MRN [id number]  Location: patient's home  Provider location:  [facility]  Evaluation Performed: Follow-up visit  PCP:  Tammi Sou, MD   Electrophysiologist:  Dr Rayann Heman  Chief Complaint:  CHF  History of Present Illness:    Anthony Daugherty is a 74 y.o. male who presents via telehealth conferencing today.  Since his BiV ICD implant, the patient reports doing very well.  He is active.  Denies procedure related complications.  Today, he denies symptoms of palpitations, chest pain, shortness of breath,  lower extremity edema, dizziness, presyncope, or syncope.  The patient is otherwise without complaint today.  The patient denies symptoms of fevers, chills, cough, or new SOB worrisome for COVID 19.  Past Medical History:  Diagnosis Date  . Aortic regurgitation    Mild-mod on echo 07/2017.  Moderate 08/2018.  Moderate-severe on echo 04/2019.  Marland Kitchen Aortic stenosis, mild   . Arthritis    L-Spine  . Ascending aorta dilatation (HCC)   . CARDIAC MURMUR Q000111Q; [id number]   Systolic and diastolic as of 123456.  . Cardiomyopathy ([place]) 10/12/2010   Global hypokinesis, EF 40-45% by cath and echo (2012 echo and 2019 echo).  Feb 2020 EF 20-25%. - mixed ICM/NICM (alcohol and CAD)  October 2020 EF 25-30%.  . Chronic combined systolic and diastolic CHF (congestive heart failure) (HCC)    max med mgmt, plus CRT-D placed 05/2019.  Marland Kitchen Coronary artery disease 11/16/2010   a. 2012 - 80% OM lesion. b. Cath 12/2018 - showing 95% prox RCA, 75% OM1, 50% prox-mid LAD. Unsuccessful PCI to 100% chronically occluded RCA summer 2020.  . Dilated aortic  root ([place]) 10/21/2018  . Diverticulosis   . Erectile dysfunction 08/16/2011  . ESSENTIAL HYPERTENSION 07/19/2010   Qualifier: Diagnosis of  By: Anitra Lauth M.D., Brien Few   . GAD (generalized anxiety disorder)    managed by Dr. Toy Care with prozac 20mg  qd  . Gout    Usually Right great toe; colchicine helps well  . Habitual alcohol use   . Hx of colonoscopy 2004; 2014   Initial was normal; 2014-polypectomy and diverticulosis.  Recall 2019.  Marland Kitchen Hyperlipidemia 10/23/2012  . LBBB (left bundle branch block)    CRT-D placed 05/2019  . Low back strain 10/24/2013  . Obesity, Class I, BMI 30-34.9   . Thyroid nodule 04/23/2013  . TOBACCO ABUSE 08/13/2010    Past Surgical History:  Procedure Laterality Date  . BIV ICD INSERTION CRT-D N/A 06/01/2019   Procedure: BIV ICD INSERTION CRT-D;  Surgeon: [name], MD;  Location: [place] [facility];  Service: Cardiovascular;  Laterality: N/A;  . CARDIAC CATHETERIZATION  10/12/10   diagnostic only: 1 vessel CAD with TIMI 3 flow, EF 40-45%.  Mild AS.  Medical mgmt of CAD.  Marland Kitchen COLONOSCOPY W/ POLYPECTOMY  10/06/12   Diverticulosis and tubular adenoma w/out high grade dysplasia.  Repeat 5 yrs (Dr. Deatra Ina).  . CORONARY ATHERECTOMY N/A 01/08/2019   100% chronic RCA occlusion impossible to clear.  Med mgmt. Procedure: CORONARY ATHERECTOMY;  Surgeon: Sherren Mocha, MD;  Location: [place] [facility];  Service: Cardiovascular;  Laterality: N/A;  .  RIGHT HEART CATH AND CORONARY ANGIOGRAPHY N/A 12/24/2018   95% RCA occlusion, 75% obtuse marginal occlusion (3V dz). Procedure: RIGHT HEART CATH AND CORONARY ANGIOGRAPHY;  Surgeon: Lorretta Harp, MD;  Location: [place] [facility];  Service: Cardiovascular;  Laterality: N/A;  . TRANSTHORACIC ECHOCARDIOGRAM  09/2010; 07/2017; 08/2018   2012:  EF 40-45%, global hypokinesis, mild AS.  07/2017: no interval change except mild/mod aortic regurg.  08/2018 EF 20-25%, global LV hypokinesis, diastolic dyfxn, biatrial enlargemt, mod AR,  aortic root 3.8 cm.    Current Outpatient Medications  Medication Sig Dispense Refill  . acetaminophen (TYLENOL) 500 MG tablet Take 500-1,000 mg by mouth every 6 (six) hours as needed (for pain.).    Marland Kitchen aspirin 81 MG tablet Take 81 mg by mouth daily.      . carvedilol (COREG) 25 MG tablet Take 1 tablet (25 mg total) by mouth 2 (two) times daily. 180 tablet 3  . colchicine 0.6 MG tablet TAKE 2 TABLETS AT ONSET OF GOUT PAIN, REPEAT IN 1 HOUR, THEN 1 TABLETTWICE/DAY TIL RESOLVED 30 tablet 4  . ENTRESTO 49-51 MG TAKE  (1)  TABLET TWICE A DAY. 60 tablet 10  . FLUoxetine (PROZAC) 20 MG capsule Take 20 mg by mouth daily.     Marland Kitchen atorvastatin (LIPITOR) 40 MG tablet Take 1 tablet (40 mg total) by mouth daily. 90 tablet 3  . spironolactone (ALDACTONE) 25 MG tablet Take 0.5 tablets (12.5 mg total) by mouth daily. 45 tablet 3   No current facility-administered medications for this visit.    Allergies:   Penicillins   Social History:  The patient  reports that he has never smoked. His smokeless tobacco use includes chew. He reports current alcohol use of about 28.0 standard drinks of alcohol per week. He reports that he does not use drugs.   Family History:  The patient's family history includes Alzheimer's disease in his mother; COPD in his father; Congenital heart disease in his brother; Heart failure in his father.   ROS:  Please see the history of present illness.   All other systems are personally reviewed and negative.    Exam:    Vital Signs:  Ht 5\' 6"  (1.676 m)   Wt 170 lb (77.1 kg)   BMI 27.44 kg/m   Well sounding and appearing, alert and conversant, regular work of breathing,  good skin color Eyes- anicteric, neuro- grossly intact, skin- no apparent rash or lesions or cyanosis, mouth- oral mucosa is pink  Labs/Other Tests and Data Reviewed:    Recent Labs: 12/21/2018: TSH 2.350 04/26/2019: ALT 9 05/28/2019: Hemoglobin 13.2; Platelets 205 06/02/2019: BUN 11; Creatinine, Ser 0.87;  Potassium 3.8; Sodium 139   Wt Readings from Last 3 Encounters:  09/02/19 170 lb (77.1 kg)  07/28/19 173 lb (78.5 kg)  06/02/19 170 lb 3.2 oz (77.2 kg)     Last device remote is reviewed from English PDF which reveals normal device function, no arrhythmias   ASSESSMENT & PLAN:    1.  Chronic systolic dysfunction/ CAD/ ischemic CM No ischemic symptoms Enroll in ICM device clinic  followup echo has been ordered by Dr Radford Pax Return to device clinic to see Oda Kilts in 3 months for device optimization depending on EF on follow-up echo.  Will need to adjust ICD to chronic settings/ outputs at that visit   Patient Risk:  after full review of this patients clinical status, I feel that they are at moderate risk at this time.  Today, I  have spent 15 minutes with the patient with telehealth technology discussing arrhythmia management .    Army Fossa, MD  09/02/2019 8:51 AM     [facility] [address] [place] [address] [phone] (office) [phone] (fax)

## 2019-09-02 NOTE — Progress Notes (Signed)
ICD Remote  

## 2019-09-21 ENCOUNTER — Encounter: Payer: Self-pay | Admitting: Family Medicine

## 2019-09-21 ENCOUNTER — Ambulatory Visit (INDEPENDENT_AMBULATORY_CARE_PROVIDER_SITE_OTHER): Payer: Medicare PPO | Admitting: Family Medicine

## 2019-09-21 ENCOUNTER — Other Ambulatory Visit: Payer: Self-pay

## 2019-09-21 VITALS — BP 116/68 | HR 59 | Temp 97.9°F | Resp 16 | Ht 65.5 in | Wt 178.0 lb

## 2019-09-21 DIAGNOSIS — I428 Other cardiomyopathies: Secondary | ICD-10-CM | POA: Diagnosis not present

## 2019-09-21 DIAGNOSIS — G3184 Mild cognitive impairment, so stated: Secondary | ICD-10-CM

## 2019-09-21 DIAGNOSIS — E7800 Pure hypercholesterolemia, unspecified: Secondary | ICD-10-CM | POA: Diagnosis not present

## 2019-09-21 DIAGNOSIS — Z0000 Encounter for general adult medical examination without abnormal findings: Secondary | ICD-10-CM

## 2019-09-21 LAB — LIPID PANEL
Cholesterol: 143 mg/dL (ref 0–200)
HDL: 62.1 mg/dL (ref >39.00)
LDL Cholesterol: 70 mg/dL (ref 0–99)
NonHDL: 81.32
Total CHOL/HDL Ratio: 2
Triglycerides: 58 mg/dL (ref 0.0–149.0)
VLDL: 11.6 mg/dL (ref 0.0–40.0)

## 2019-09-21 LAB — COMPREHENSIVE METABOLIC PANEL
ALT: 11 U/L (ref 0–53)
AST: 12 U/L (ref 0–37)
Albumin: 4.2 g/dL (ref 3.5–5.2)
Alkaline Phosphatase: 73 U/L (ref 39–117)
BUN: 15 mg/dL (ref 6–23)
CO2: 27 mEq/L (ref 19–32)
Calcium: 8.8 mg/dL (ref 8.4–10.5)
Chloride: 103 mEq/L (ref 96–112)
Creatinine, Ser: 0.92 mg/dL (ref 0.40–1.50)
GFR: 80.63 mL/min (ref >60.00)
Glucose, Bld: 101 mg/dL — ABNORMAL HIGH (ref 70–99)
Potassium: 4.1 mEq/L (ref 3.5–5.1)
Sodium: 136 mEq/L (ref 135–145)
Total Bilirubin: 0.7 mg/dL (ref 0.2–1.2)
Total Protein: 6.7 g/dL (ref 6.0–8.3)

## 2019-09-21 NOTE — Progress Notes (Signed)
Office Note 09/21/2019  CC:  Chief Complaint  Patient presents with  . Annual Exam    pt is fasting    HPI:  Anthony Daugherty is a 74 y.o. male who is here accompanied by his wife for annual health maintenance exam. Feeling well.  Having some forgetfulness per wife, ongoing for years and gradually getting worse.   No losing things, no getting lost while driving. FH alzheimer's--mother.  Still drinks 4 beers 4-5 days per week--has been long term.  Lost his license due to [place].   Past Medical History:  Diagnosis Date  . Aortic regurgitation    Mild-mod on echo 07/2017.  Moderate 08/2018.  Moderate-severe on echo 04/2019.  Marland Kitchen Aortic stenosis, mild   . Arthritis    L-Spine  . Ascending aorta dilatation (HCC)   . CARDIAC MURMUR [id number]; [id number]   Systolic and diastolic as of [id number].  . Cardiomyopathy ([place]) 10/12/2010   Global hypokinesis, EF 40-45% by cath and echo (2012 echo and 2019 echo).  Feb 2020 EF 20-25%. - mixed ICM/NICM (alcohol and CAD)  October 2020 EF 25-30%.  . Chronic combined systolic and diastolic CHF (congestive heart failure) (HCC)    max med mgmt, plus CRT-D placed 05/2019.  Marland Kitchen Coronary artery disease 11/16/2010   a. 2012 - 80% OM lesion. b. Cath 12/2018 - showing 95% prox RCA, 75% OM1, 50% prox-mid LAD. Unsuccessful PCI to 100% chronically occluded RCA summer 2020.  . Dilated aortic root ([place]) 10/21/2018  . Diverticulosis   . Erectile dysfunction 08/16/2011  . ESSENTIAL HYPERTENSION 07/19/2010   Qualifier: Diagnosis of  By: Anitra Lauth M.D., Brien Few   . GAD (generalized anxiety disorder)    managed by Dr. Toy Care with prozac 20mg  qd  . Gout    Usually Right great toe; colchicine helps well  . Habitual alcohol use   . Hx of colonoscopy 2004; 2014   Initial was normal; 2014-polypectomy and diverticulosis.  Recall 2019.  Marland Kitchen Hyperlipidemia 10/23/2012  . LBBB (left bundle branch block)    CRT-D placed 05/2019  . Low back strain 10/24/2013  . Obesity, Class I, BMI  30-34.9   . Thyroid nodule 04/23/2013  . TOBACCO ABUSE 08/13/2010    Past Surgical History:  Procedure Laterality Date  . BIV ICD INSERTION CRT-D N/A 06/01/2019   Procedure: BIV ICD INSERTION CRT-D;  Surgeon: [name], MD;  Location: [facility];  Service: Cardiovascular;  Laterality: N/A;  . CARDIAC CATHETERIZATION  10/12/10   diagnostic only: 1 vessel CAD with TIMI 3 flow, EF 40-45%.  Mild AS.  Medical mgmt of CAD.  Marland Kitchen COLONOSCOPY W/ POLYPECTOMY  10/06/12   Diverticulosis and tubular adenoma w/out high grade dysplasia.  Repeat 5 yrs (Dr. Deatra Ina).  . CORONARY ATHERECTOMY N/A 01/08/2019   100% chronic RCA occlusion impossible to clear.  Med mgmt. Procedure: CORONARY ATHERECTOMY;  Surgeon: Sherren Mocha, MD;  Location: [facility];  Service: Cardiovascular;  Laterality: N/A;  . RIGHT HEART CATH AND CORONARY ANGIOGRAPHY N/A 12/24/2018   95% RCA occlusion, 75% obtuse marginal occlusion (3V dz). Procedure: RIGHT HEART CATH AND CORONARY ANGIOGRAPHY;  Surgeon: Lorretta Harp, MD;  Location: [facility];  Service: Cardiovascular;  Laterality: N/A;  . TRANSTHORACIC ECHOCARDIOGRAM  09/2010; 07/2017; 08/2018   2012:  EF 40-45%, global hypokinesis, mild AS.  07/2017: no interval change except mild/mod aortic regurg.  08/2018 EF 20-25%, global LV hypokinesis, diastolic dyfxn, biatrial enlargemt, mod AR, aortic root 3.8 cm.  Family History  Problem Relation Age of Onset  . Congenital heart disease Brother        cardiomyopathy  . Alzheimer's disease Mother   . COPD Father   . Heart failure Father        congestive  . Colon cancer Neg Hx   . Esophageal cancer Neg Hx   . Stomach cancer Neg Hx   . Rectal cancer Neg Hx     Social History   Socioeconomic History  . Marital status: Married    Spouse name: Thayer Headings  . Number of children: 2  . Years of education: Not on file  . Highest education level: Not on file  Occupational History  . Occupation: Designer, multimedia: [employer]  Tobacco Use  . Smoking status: Never Smoker  . Smokeless tobacco: Current User    Types: Chew  . Tobacco comment: chews tobacco  Substance and Sexual Activity  . Alcohol use: Yes    Alcohol/week: 28.0 standard drinks    Types: 28 Cans of beer per week  . Drug use: No  . Sexual activity: Yes  Other Topics Concern  . Not on file  Social History Narrative   Delivery driver.  Married, two children.   Long hx of smoking but quit after 2010.      Lives in [place].   Retired delivery person for a [employer]   Social Determinants of Radio broadcast assistant Strain:   . Difficulty of Paying Living Expenses: Not on file  Food Insecurity:   . Worried About Charity fundraiser in the Last Year: Not on file  . Ran Out of Food in the Last Year: Not on file  Transportation Needs:   . Lack of Transportation (Medical): Not on file  . Lack of Transportation (Non-Medical): Not on file  Physical Activity:   . Days of Exercise per Week: Not on file  . Minutes of Exercise per Session: Not on file  Stress:   . Feeling of Stress : Not on file  Social Connections:   . Frequency of Communication with Friends and Family: Not on file  . Frequency of Social Gatherings with Friends and Family: Not on file  . Attends Religious Services: Not on file  . Active Member of Clubs or Organizations: Not on file  . Attends Archivist Meetings: Not on file  . Marital Status: Not on file  Intimate Partner Violence:   . Fear of Current or Ex-Partner: Not on file  . Emotionally Abused: Not on file  . Physically Abused: Not on file  . Sexually Abused: Not on file    Outpatient Medications Prior to Visit  Medication Sig Dispense Refill  . acetaminophen (TYLENOL) 500 MG tablet Take 500-1,000 mg by mouth every 6 (six) hours as needed (for pain.).    Marland Kitchen aspirin 81 MG tablet Take 81 mg by mouth daily.      Marland Kitchen atorvastatin (LIPITOR) 40 MG tablet Take 1 tablet (40 mg  total) by mouth daily. 90 tablet 3  . carvedilol (COREG) 25 MG tablet Take 1 tablet (25 mg total) by mouth 2 (two) times daily. 180 tablet 3  . colchicine 0.6 MG tablet TAKE 2 TABLETS AT ONSET OF GOUT PAIN, REPEAT IN 1 HOUR, THEN 1 TABLETTWICE/DAY TIL RESOLVED 30 tablet 4  . ENTRESTO 49-51 MG TAKE  (1)  TABLET TWICE A DAY. 60 tablet 10  . FLUoxetine (PROZAC) 20 MG capsule Take  20 mg by mouth daily.     Marland Kitchen spironolactone (ALDACTONE) 25 MG tablet Take 0.5 tablets (12.5 mg total) by mouth daily. 45 tablet 3   No facility-administered medications prior to visit.    Allergies  Allergen Reactions  . Penicillins Swelling    Did it involve swelling of the face/tongue/throat, SOB, or low BP? No Did it involve sudden or severe rash/hives, skin peeling, or any reaction on the inside of your mouth or nose? No Did you need to seek medical attention at a hospital or doctor's office? No When did it last happen? ~10 years ago If all above answers are "NO", may proceed with cephalosporin use.     ROS Review of Systems  Constitutional: Negative for appetite change, chills, fatigue and fever.  HENT: Negative for congestion, dental problem, ear pain and sore throat.   Eyes: Negative for discharge, redness and visual disturbance.  Respiratory: Negative for cough, chest tightness, shortness of breath and wheezing.   Cardiovascular: Negative for chest pain, palpitations and leg swelling.  Gastrointestinal: Negative for abdominal pain, blood in stool, diarrhea, nausea and vomiting.  Genitourinary: Negative for difficulty urinating, dysuria, flank pain, frequency, hematuria and urgency.  Musculoskeletal: Negative for arthralgias, back pain, joint swelling, myalgias and neck stiffness.  Skin: Negative for pallor and rash.  Neurological: Negative for dizziness, speech difficulty, weakness and headaches.  Hematological: Negative for adenopathy. Does not bruise/bleed easily.  Psychiatric/Behavioral: Negative for  confusion and sleep disturbance. The patient is not nervous/anxious.        Memory impairment --see HPI    PE; Blood pressure 116/68, pulse (!) 59, temperature 97.9 F (36.6 C), temperature source Temporal, resp. rate 16, height 5' 5.5" (1.664 m), weight 178 lb (80.7 kg), SpO2 100 %. Body mass index is 29.17 kg/m. MMSE today:  25/30 Gen: Alert, well appearing.  Patient is oriented to person, place, time, and situation. AFFECT: pleasant, lucid thought and speech. ENT: Ears: EACs clear, normal epithelium.  TMs with good light reflex and landmarks bilaterally.  Eyes: no injection, icteris, swelling, or exudate.  EOMI, PERRLA. Nose: no drainage or turbinate edema/swelling.  No injection or focal lesion.  Mouth: lips without lesion/swelling.  Oral mucosa pink and moist.  Dentition intact and without obvious caries or gingival swelling.  Oropharynx without erythema, exudate, or swelling.  Neck: supple/nontender.  No LAD, mass, or TM.  Carotid pulses 2+ bilaterally, without bruits. CV: RRR, no m/r/g.   LUNGS: CTA bilat, nonlabored resps, good aeration in all lung fields. ABD: soft, NT, ND, BS normal.  No hepatospenomegaly or mass.  No bruits. EXT: no clubbing, cyanosis, or edema.  Musculoskeletal: no joint swelling, erythema, warmth, or tenderness.  ROM of all joints intact. Skin - no sores or suspicious lesions or rashes or color changes Rectal exam: negative without mass, lesions or tenderness, PROSTATE EXAM: smooth and symmetric without nodules or tenderness.   Pertinent labs:  Lab Results  Component Value Date   TSH 2.350 12/21/2018   Lab Results  Component Value Date   WBC 3.8 05/28/2019   HGB 13.2 05/28/2019   HCT 37.9 05/28/2019   MCV 91 05/28/2019   PLT 205 05/28/2019   Lab Results  Component Value Date   CREATININE 0.87 06/02/2019   BUN 11 06/02/2019   NA 139 06/02/2019   K 3.8 06/02/2019   CL 109 06/02/2019   CO2 21 (L) 06/02/2019   Lab Results  Component Value Date    ALT 9 04/26/2019   AST  12 04/26/2019   ALKPHOS 69 04/26/2019   BILITOT 0.7 04/26/2019   Lab Results  Component Value Date   CHOL 141 04/26/2019   Lab Results  Component Value Date   HDL 72 04/26/2019   Lab Results  Component Value Date   LDLCALC 57 04/26/2019   Lab Results  Component Value Date   TRIG 59 04/26/2019   Lab Results  Component Value Date   CHOLHDL 2.0 04/26/2019   Lab Results  Component Value Date   PSA 2.54 08/01/2017   PSA 1.42 12/08/2015   PSA 1.59 07/22/2014    ASSESSMENT AND PLAN:    1) Mild cognitive impairment with memory loss:  MMSE not too bad today. Alcohol abuse over the years no doubt playing a role and I suspect he has some microvascular dz in brain, but also wonder about early alzheimers at this time. Obs for now.  Encouraged pt to cut back on alcohol---he states he will NOT quit completely.  2) Health maintenance exam: Reviewed age and gender appropriate health maintenance issues (prudent diet, regular exercise, health risks of tobacco and excessive alcohol, use of seatbelts, fire alarms in home, use of sunscreen).  Also reviewed age and gender appropriate health screening as well as vaccine recommendations. Vaccines: ALL UTD, including shingrix. Labs: CMEt, FLP. Prostate ca screening: DRE , PSA. Colon ca screening: hx of adenoma 2014 (Dr. Deatra Ina), was due for recall 2019-->pt to contact their office to schedule.  An After Visit Summary was printed and given to the patient.  FOLLOW UP:  Return in about 6 months (around 03/23/2020) for routine chronic illness f/u.  Signed:  Crissie Sickles, MD           09/21/2019

## 2019-09-21 NOTE — Patient Instructions (Signed)
Call [facility] to set up a repeat colonoscopy: [phone]

## 2019-09-27 ENCOUNTER — Encounter: Payer: Self-pay | Admitting: Gastroenterology

## 2019-10-04 ENCOUNTER — Telehealth: Payer: Self-pay

## 2019-10-04 NOTE — Telephone Encounter (Signed)
Patient referred to [facility] clinic by Dr Rayann Heman at 09/02/2019 telehealth visit.  Attempted ICM intro call and left message to return call.

## 2019-10-04 NOTE — Telephone Encounter (Signed)
Patient returned call and ICM intro given.  He agreed to [facility] monthly follow up.  He does not usually have HF symptoms and feeling fine at this time.  He does not weigh at home but occasionally will weigh at the pharmacy.  Re viewed fluid symptoms to report and encouraged to cal if needed.  He has ICM direct phone number.  ICM Remote Transmission scheduled 10/25/2019.

## 2019-10-12 ENCOUNTER — Telehealth: Payer: Self-pay | Admitting: *Deleted

## 2019-10-12 NOTE — Telephone Encounter (Signed)
Dr. Sydnee Levans and Jenny Reichmann,  This pt's last ECHO on 04-26-19- EF was 25-30%.  He did have an ICD place on 06-01-19 and has his next ECHO scheduled for 10-26-19.  He is coming in on 10-25-19 for a PV and his repeat colonoscopy (Hx polyps) is on 11-08-19.  He does have an extensive cardiac hx.  If his EF is better, is he ok for the [facility]?  Or would you like an OV instead of PV?  Please advise, Cyril Mourning

## 2019-10-12 NOTE — Telephone Encounter (Signed)
We definitely need to await the results of the Echo prior to proceeding. If EF is within acceptable range to proceed at [facility] I would do so. Any way you can reschedule his PV for after 4/13 to have that result? If EF is not improved and remains < 35% he will need a clinic visit. Thanks

## 2019-10-12 NOTE — Telephone Encounter (Signed)
LMOM to pt to call back to move his PV to later that same week- after ECHO on 10-26-19

## 2019-10-25 ENCOUNTER — Other Ambulatory Visit: Payer: Self-pay

## 2019-10-25 ENCOUNTER — Ambulatory Visit (AMBULATORY_SURGERY_CENTER): Payer: Self-pay | Admitting: *Deleted

## 2019-10-25 VITALS — Temp 96.0°F | Ht 65.0 in | Wt 176.8 lb

## 2019-10-25 DIAGNOSIS — Z86010 Personal history of colonic polyps: Secondary | ICD-10-CM

## 2019-10-25 MED ORDER — SUPREP BOWEL PREP KIT 17.5-3.13-1.6 GM/177ML PO SOLN: 1.0000 | Freq: Once | ORAL | 0 refills | Status: AC

## 2019-10-25 NOTE — Progress Notes (Signed)
Completed covid vaccines 09-14-2019 Wife's temp in PV 96.9  No egg or soy allergy known to patient  No issues with past sedation with any surgeries  or procedures, no intubation problems  No diet pills per patient No home 02 use per patient  No blood thinners per patient  Pt denies issues with constipation  No A fib or A flutter  EMMI video sent to pt's e mail   Due to the COVID-19 pandemic we are asking patients to follow these guidelines. Please only bring one care partner. Please be aware that your care partner may wait in the car in the parking lot or if they feel like they will be too hot to wait in the car, they may wait in the lobby on the 4th floor. All care partners are required to wear a mask the entire time (we do not have any that we can provide them), they need to practice social distancing, and we will do a Covid check for all patient's and care partners when you arrive. Also we will check their temperature and your temperature. If the care partner waits in their car they need to stay in the parking lot the entire time and we will call them on their cell phone when the patient is ready for discharge so they can bring the car to the front of the building. Also all patient's will need to wear a mask into building  Last EF on Echo 25-30%- Pt has Echo scheduled for tomorrow 4-13- Per TE with Dr Havery Moros- If EF > 35% okay for 4-26 [facility]  colon . -f 35 or < will need Ov- pt and wife made aware of this today in PV and explaiend a nurse will call them by Friday with results and plan - both verbalized understanding

## 2019-10-26 ENCOUNTER — Ambulatory Visit (HOSPITAL_COMMUNITY): Payer: Medicare PPO | Attending: Internal Medicine

## 2019-10-26 ENCOUNTER — Ambulatory Visit (INDEPENDENT_AMBULATORY_CARE_PROVIDER_SITE_OTHER): Payer: Medicare PPO

## 2019-10-26 DIAGNOSIS — I351 Nonrheumatic aortic (valve) insufficiency: Secondary | ICD-10-CM | POA: Diagnosis not present

## 2019-10-26 DIAGNOSIS — Z95810 Presence of automatic (implantable) cardiac defibrillator: Secondary | ICD-10-CM

## 2019-10-26 DIAGNOSIS — I5022 Chronic systolic (congestive) heart failure: Secondary | ICD-10-CM

## 2019-10-26 NOTE — Progress Notes (Signed)
EPIC Encounter for ICM Monitoring  Patient Name: Anthony Daugherty is a 74 y.o. male Date: 10/26/2019 Primary Care Physican: Tammi Sou, MD Primary Cardiologist: Radford Pax Electrophysiologist: Allred Bi-V Pacing: 99%  10/26/2019 Weight: 175 lbs        1st ICM Remote transmission. Heart Failure questions reviewed.  Pt asymptomatic for fluid accumulation.  He admits he does like to eat salt and does salt his foods.   CorVue thoracic impedance suggesting possible ongoing fluid accumulation since 10/19/2019.  Prescribed: Spironolactone 25 mg take 0.5 tablet (12.5 mg total) daily.  Labs: 09/21/2019 Creatinine 0.92, BUN 15, Potassium 4.1, Sodium 136, GFR 80.63  Recommendations: Encouraged to limit salt intake to < 2000 mg daily.  Encouraged to call if experiencing fluid symptoms.  Follow-up plan: ICM clinic phone appointment on 11/03/2019 to recheck fluid levels.   91 day device clinic remote transmission 12/02/2019.    Copy of ICM check sent to Dr. Rayann Heman and Dr Radford Pax.   3 month ICM trend: 10/25/2019    1 Year ICM trend:       Rosalene Billings, RN 10/26/2019 9:14 AM

## 2019-10-27 ENCOUNTER — Other Ambulatory Visit: Payer: Self-pay | Admitting: Family Medicine

## 2019-10-27 ENCOUNTER — Encounter: Payer: Self-pay | Admitting: Family Medicine

## 2019-10-27 ENCOUNTER — Telehealth: Payer: Self-pay | Admitting: *Deleted

## 2019-10-27 NOTE — Telephone Encounter (Signed)
Pt's ECHO reviewed by [facility] Monday CRNA and ok for [facility].  Notified pt and told him we will see him as scheduled on 11-08-19. Understanding voiced

## 2019-10-27 NOTE — Progress Notes (Signed)
Attempted return call to wife as requested by voice mail message.  Left call back number.

## 2019-10-27 NOTE — Progress Notes (Signed)
Spoke with wife and transmission reviewed.  She reports patient has ankle swelling that developed within the last week.  She reports patient drinks a lot of beer.  She said patient unable to recall the conversation about the remote transmission due to he had been drinking.  She did not provide the amount of alcohol patient drinks.  Advised patient should be limiting fluid intake to 64 oz daily and advised alcohol may worsen heart condition.  Discussed limiting salt intake and patient only eats dinner daily, no breakfast or lunch.   Advised to limit salt intake to 2000 mg daily.  Explained will recheck fluid levels next week and will call back if Dr Radford Pax has any recommendations.    Per PCP epic notes patient drinks 4 beers 4-5 days per week--has been long term.

## 2019-11-03 ENCOUNTER — Ambulatory Visit (INDEPENDENT_AMBULATORY_CARE_PROVIDER_SITE_OTHER): Payer: Medicare PPO

## 2019-11-03 DIAGNOSIS — Z7982 Long term (current) use of aspirin: Secondary | ICD-10-CM | POA: Diagnosis not present

## 2019-11-03 DIAGNOSIS — I5022 Chronic systolic (congestive) heart failure: Secondary | ICD-10-CM

## 2019-11-03 DIAGNOSIS — M1990 Unspecified osteoarthritis, unspecified site: Secondary | ICD-10-CM | POA: Diagnosis not present

## 2019-11-03 DIAGNOSIS — F349 Persistent mood [affective] disorder, unspecified: Secondary | ICD-10-CM | POA: Diagnosis not present

## 2019-11-03 DIAGNOSIS — Z95810 Presence of automatic (implantable) cardiac defibrillator: Secondary | ICD-10-CM

## 2019-11-03 DIAGNOSIS — M109 Gout, unspecified: Secondary | ICD-10-CM | POA: Diagnosis not present

## 2019-11-03 DIAGNOSIS — Z8249 Family history of ischemic heart disease and other diseases of the circulatory system: Secondary | ICD-10-CM | POA: Diagnosis not present

## 2019-11-03 DIAGNOSIS — F419 Anxiety disorder, unspecified: Secondary | ICD-10-CM | POA: Diagnosis not present

## 2019-11-03 DIAGNOSIS — E785 Hyperlipidemia, unspecified: Secondary | ICD-10-CM | POA: Diagnosis not present

## 2019-11-03 DIAGNOSIS — F1020 Alcohol dependence, uncomplicated: Secondary | ICD-10-CM | POA: Diagnosis not present

## 2019-11-03 DIAGNOSIS — I10 Essential (primary) hypertension: Secondary | ICD-10-CM | POA: Diagnosis not present

## 2019-11-05 ENCOUNTER — Other Ambulatory Visit: Payer: Self-pay

## 2019-11-05 ENCOUNTER — Telehealth: Payer: Self-pay

## 2019-11-05 ENCOUNTER — Encounter: Payer: Self-pay | Admitting: Cardiology

## 2019-11-05 ENCOUNTER — Telehealth (INDEPENDENT_AMBULATORY_CARE_PROVIDER_SITE_OTHER): Payer: Medicare PPO | Admitting: Cardiology

## 2019-11-05 VITALS — BP 139/72 | HR 80 | Ht 65.0 in | Wt 175.0 lb

## 2019-11-05 DIAGNOSIS — I2510 Atherosclerotic heart disease of native coronary artery without angina pectoris: Secondary | ICD-10-CM | POA: Diagnosis not present

## 2019-11-05 DIAGNOSIS — I420 Dilated cardiomyopathy: Secondary | ICD-10-CM

## 2019-11-05 DIAGNOSIS — E7800 Pure hypercholesterolemia, unspecified: Secondary | ICD-10-CM | POA: Diagnosis not present

## 2019-11-05 DIAGNOSIS — I712 Thoracic aortic aneurysm, without rupture, unspecified: Secondary | ICD-10-CM

## 2019-11-05 DIAGNOSIS — I351 Nonrheumatic aortic (valve) insufficiency: Secondary | ICD-10-CM

## 2019-11-05 DIAGNOSIS — I77810 Thoracic aortic ectasia: Secondary | ICD-10-CM

## 2019-11-05 DIAGNOSIS — I5022 Chronic systolic (congestive) heart failure: Secondary | ICD-10-CM

## 2019-11-05 NOTE — H&P (View-Only) (Signed)
Virtual Visit via Telephone Note   This visit type was conducted due to national recommendations for restrictions regarding the COVID-19 Pandemic (e.g. social distancing) in an effort to limit this patient's exposure and mitigate transmission in our community.  Due to his co-morbid illnesses, this patient is at least at moderate risk for complications without adequate follow up.  This format is felt to be most appropriate for this patient at this time.  The patient did not have access to video technology/had technical difficulties with video requiring transitioning to audio format only (telephone).  All issues noted in this document were discussed and addressed.  No physical exam could be performed with this format.  Please refer to the patient's chart for his  consent to telehealth for [facility].  Evaluation Performed:  Follow-up visit  This visit type was conducted due to national recommendations for restrictions regarding the COVID-19 Pandemic (e.g. social distancing).  This format is felt to be most appropriate for this patient at this time.  All issues noted in this document were discussed and addressed.  No physical exam was performed (except for noted visual exam findings with Video Visits).  Please refer to the patient's chart (MyChart message for video visits and phone note for telephone visits) for the patient's consent to telehealth for [facility].  Date:  11/05/2019   ID:  Anthony Daugherty, DOB 04-23-1946, MRN [id number]  Patient Location:  Home  Provider location:   [place]  PCP:  Tammi Sou, MD  Cardiologist:  Fransico Him, MD  Electrophysiologist:  None   Chief Complaint:  DCM and AI  History of Present Illness:    Anthony Daugherty is a 74 y.o. male who presents via audio/video conferencing for a telehealth visit today.    This is a 74yo male with a history of aortic stenosis and AR with dilated aortic root. He apparently had an echo 07/2017  showing mild aortic stenosis and mild to moderate AR and aortic root 47mm. He also has a history of HTN, hyperlipidemia, nonischemic DCM with EF 40-45 by cath and echo in 2012 and ASCAD with 80% OM lesion medically managed.   Repeat echo 08/2018 showed moderate AR with aortic root 71mm. Unfortunately his echo also shows that his LVF has significantly declined with EF 20-25% with mildly dilated LV and diffuse HK. There was severe LAE and moderate RAE and mild pulmonary HTN. At his initial OV in [id number], his Bystolic was changed to Carvedilol and he was continued on ARB.    He underwent cath 12/2018 showing 95% pRCA, 75% OM1 and 50% prox to mid LAD.  He underwent attempted PCI of the RCA but unable to cross the lesion and medical therapy was recommended.  It was felt that his CM was mixed ICM/NICM with possible ETOH contribution as well.  He has seen my extenders several times trying to titrate HF meds but has been limited by soft Bps.  He was on Entresto 49/51mg  BID, Carvedilol 25mg  BID and repeat echo 04/2019 showed EF 25-30% with mils AS and moderate to severe AI.  There was no M spike on SPEP/UPEP and TSH and ferritin were normal.  He was referred to EP and underwent [employer] BiV ICD on 06/01/2019.    He had a followup 2D echo on 10/26/2018 showing low normal LVR with EF 50% with mildly dilated LV, moderate RVE with mildly reduced RVF, moderate to severe AR with flow reversal in the descending aorta with mild AS  and dilated aortic root at 27mm.  Compared to prior echo, the AI has worsened.  He is now here for followup to discuss TEE.  The patient does not have symptoms concerning for COVID-19 infection (fever, chills, cough, or new shortness of breath).    Prior CV studies:   The following studies were reviewed today:  2D echo 10/26/2019  Past Medical History:  Diagnosis Date  . Aortic regurgitation    Mild-mod on echo 07/2017.  Moderate 08/2018.  Moderate-severe on echo 04/2019.  Marland Kitchen Aortic  stenosis, mild   . Arthritis    L-Spine  . Ascending aorta dilatation (HCC)   . CARDIAC MURMUR [id number]; [id number]   Systolic and diastolic as of [id number].  . Cardiomyopathy ([place]) 10/12/2010   Global hypokinesis, EF 40-45% by cath and echo (2012 echo and 2019 echo).  Feb 2020 EF 20-25%. - mixed ICM/NICM (alcohol and CAD)  October 2020 EF 25-30%.  . Chronic combined systolic and diastolic CHF (congestive heart failure) (HCC)    max med mgmt, plus CRT-D placed 05/2019.  Marland Kitchen Coronary artery disease 11/16/2010   a. 2012 - 80% OM lesion. b. Cath 12/2018 - showing 95% prox RCA, 75% OM1, 50% prox-mid LAD. Unsuccessful PCI to 100% chronically occluded RCA summer 2020.  . Dilated aortic root ([place]) 10/21/2018  . Diverticulosis   . Erectile dysfunction 08/16/2011  . ESSENTIAL HYPERTENSION 07/19/2010   Qualifier: Diagnosis of  By: Anitra Lauth M.D., Brien Few   . GAD (generalized anxiety disorder)    managed by Dr. Toy Care with prozac 20mg  qd  . Gout    Usually Right great toe; colchicine helps well  . Habitual alcohol use   . Hx of colonoscopy 2004; 2014   Initial was normal; 2014-polypectomy and diverticulosis.  Recall 2019.  Marland Kitchen Hyperlipidemia 10/23/2012  . LBBB (left bundle branch block)    CRT-D placed 05/2019  . Low back strain 10/24/2013  . Obesity, Class I, BMI 30-34.9   . Thyroid nodule 04/23/2013  . TOBACCO ABUSE 08/13/2010   Past Surgical History:  Procedure Laterality Date  . BIV ICD INSERTION CRT-D N/A 06/01/2019   Procedure: BIV ICD INSERTION CRT-D;  Surgeon: [name], MD;  Location: [place] [facility];  Service: Cardiovascular;  Laterality: N/A;  . CARDIAC CATHETERIZATION  10/12/10   diagnostic only: 1 vessel CAD with TIMI 3 flow, EF 40-45%.  Mild AS.  Medical mgmt of CAD.  Marland Kitchen COLONOSCOPY    . COLONOSCOPY W/ POLYPECTOMY  10/06/12   Diverticulosis and tubular adenoma w/out high grade dysplasia.  Repeat 5 yrs (Dr. Deatra Ina).  . CORONARY ATHERECTOMY N/A 01/08/2019   100% chronic RCA occlusion  impossible to clear.  Med mgmt. Procedure: CORONARY ATHERECTOMY;  Surgeon: Sherren Mocha, MD;  Location: [place] [facility];  Service: Cardiovascular;  Laterality: N/A;  . POLYPECTOMY    . RIGHT HEART CATH AND CORONARY ANGIOGRAPHY N/A 12/24/2018   95% RCA occlusion, 75% obtuse marginal occlusion (3V dz). Procedure: RIGHT HEART CATH AND CORONARY ANGIOGRAPHY;  Surgeon: Lorretta Harp, MD;  Location: [place] [facility];  Service: Cardiovascular;  Laterality: N/A;  . TRANSTHORACIC ECHOCARDIOGRAM  09/2010; 07/2017; 08/2018; 10/2019   2012:  EF 40-45%, global hypokinesis, mild AS.  07/2017: no interval chg except mild/mod AR.  08/2018 EF 20-25%, global LV hypok, DD, biatrial enlargemt, mod AR, aortic root 3.8 cm. 10/2019 EF 50%, mild dec RV fxn, mod-sev AR, aor root dil 42 mm-->TEE to be done     Current Meds  Medication Sig  .  acetaminophen (TYLENOL) 500 MG tablet Take 500-1,000 mg by mouth every 6 (six) hours as needed (for pain.).  Marland Kitchen aspirin 81 MG tablet Take 81 mg by mouth daily.    Marland Kitchen atorvastatin (LIPITOR) 40 MG tablet Take 1 tablet (40 mg total) by mouth daily.  . carvedilol (COREG) 25 MG tablet Take 1 tablet (25 mg total) by mouth 2 (two) times daily.  . colchicine 0.6 MG tablet TAKE 2 TABLETS AT ONSET OF GOUT PAIN, REPEAT IN 1 HOUR, THEN 1 TABLETTWICE/DAY TIL RESOLVED  . ENTRESTO 49-51 MG TAKE  (1)  TABLET TWICE A DAY.  Marland Kitchen FLUoxetine (PROZAC) 20 MG capsule Take 20 mg by mouth daily.      Allergies:   Penicillins   Social History   Tobacco Use  . Smoking status: Former Research scientist (life sciences)  . Smokeless tobacco: Current User    Types: Chew  . Tobacco comment: chews tobacco  Substance Use Topics  . Alcohol use: Yes    Alcohol/week: 28.0 standard drinks    Types: 28 Cans of beer per week  . Drug use: No     Family Hx: The patient's family history includes Alzheimer's disease in his mother; COPD in his father; Congenital heart disease in his brother; Heart failure in his father. There is no history  of Colon cancer, Esophageal cancer, Stomach cancer, Rectal cancer, or Colon polyps.  ROS:   Please see the history of present illness.     All other systems reviewed and are negative.   Labs/Other Tests and Data Reviewed:    Recent Labs: 12/21/2018: TSH 2.350 05/28/2019: Hemoglobin 13.2; Platelets 205 09/21/2019: ALT 11; BUN 15; Creatinine, Ser 0.92; Potassium 4.1; Sodium 136   Recent Lipid Panel Lab Results  Component Value Date/Time   CHOL 143 09/21/2019 11:26 AM   CHOL 141 04/26/2019 09:12 AM   TRIG 58.0 09/21/2019 11:26 AM   TRIG 105 05/10/2008 12:00 AM   HDL 62.10 09/21/2019 11:26 AM   HDL 72 04/26/2019 09:12 AM   CHOLHDL 2 09/21/2019 11:26 AM   LDLCALC 70 09/21/2019 11:26 AM   LDLCALC 57 04/26/2019 09:12 AM   LDLCALC 101 05/10/2008 12:00 AM    Wt Readings from Last 3 Encounters:  11/05/19 175 lb (79.4 kg)  10/25/19 176 lb 12.8 oz (80.2 kg)  09/21/19 178 lb (80.7 kg)     Objective:    Vital Signs:  BP 139/72   Pulse 80   Ht 5\' 5"  (1.651 m)   Wt 175 lb (79.4 kg)   BMI 29.12 kg/m     ASSESSMENT & PLAN:    1.  Aortic insufficiency -recent 2D echo showed moderate to severe aortic insufficiency -I have recommended that we proceed with TEE to better determine severity -The risks and benefits of transesophageal echocardiogram have been explained including risks of esophageal damage, perforation (1:10,000 risk), bleeding, pharyngeal hematoma as well as other potential complications associated with conscious sedation including aspiration, arrhythmia, respiratory failure and death. Alternatives to treatment were discussed, questions were answered. Patient is willing to proceed.   2.  Severe mixed DCM -echo 04/2019 was 25-30% despite maximum tolerated HF meds -now s/p BiV ICD in Nov 2020 -repeat EF 10/2019 showed improved LVF with EF 50% -continue Entresto 49-51mg  BID, Carvedilol 25mg  BID, spiro 12.5mg  daily  3.  Chronic combined systolic/diastolic CHF -he is NYHA  class 2a -SOB is much improved and he denies any LE edema -continue Entresto 49-51mg  BID, carvedilol 25mg  BID, spiro 12.5mg  daily -Creatinine 0.92 and K+  4.1 in March 2021  4.  Mild ascending aortic and aortic root aneurysms -2D echo 04/2019 showed 4.3cm aortic root and 4.4cm Ascending aorta -42mm on 2D echo -get Chest CTA to assess further as well as abomen  5.  ASCAD - cath 12/2018 showing 95% pRCA, 75% OM1 and 50% prox to mid LAD.  He underwent attempted PCI of the RCA but unable to cross the lesion and medical therapy was recommended. -he has not had any angina -continue ASA, BB and statin  6.  HLD -LDL goal < 70 -LDL was 70 in March 2021 -continue atorvastatin 40mg  daily   COVID-19 Education: The signs and symptoms of COVID-19 were discussed with the patient and how to seek care for testing (follow up with PCP or arrange E-visit).  The importance of social distancing was discussed today.  Patient Risk:   After full review of this patient's clinical status, I feel that they are at least moderate risk at this time.  Time:   Today, I have spent 20 minutes on telemedicine discussing medical problems including AI, DCM, aortic aneurysm, CAD, HLD, HTN and reviewing patient's chart including 2D echo and reviewed risks and benefits of TEE.  Medication Adjustments/Labs and Tests Ordered: Current medicines are reviewed at length with the patient today.  Concerns regarding medicines are outlined above.  Tests Ordered: No orders of the defined types were placed in this encounter.  Medication Changes: No orders of the defined types were placed in this encounter.   Disposition:  Follow up in 6 month(s)  Signed, Fransico Him, MD  11/05/2019 1:09 PM    [place]

## 2019-11-05 NOTE — Telephone Encounter (Signed)
Remote ICM transmission received.  Attempted call to wife regarding ICM remote transmission and left detailed message per DPR.  Advised to return call for any fluid symptoms or questions. Next ICM remote transmission scheduled 12/06/2019.

## 2019-11-05 NOTE — Progress Notes (Signed)
EPIC Encounter for ICM Monitoring  Patient Name: Anthony Daugherty is a 74 y.o. male Date: 11/05/2019 Primary Care Physican: Tammi Sou, MD Primary Cardiologist: Radford Pax Electrophysiologist: Allred Bi-V Pacing: 99%            10/26/2019 Weight: 175 lbs                                                            Attempted call to wife and unable to reach.  Left detailed message per DPR regarding transmission. Transmission reviewed.    CorVue thoracic impedance suggesting possible ongoing fluid accumulation since 10/19/2019.  Prescribed: Spironolactone 25 mg take 0.5 tablet (12.5 mg total) daily.  Labs: 09/21/2019 Creatinine 0.92, BUN 15, Potassium 4.1, Sodium 136, GFR 80.63  Recommendations:  No changes and encouraged to call if experiencing any fluid symptoms.  Follow-up plan: ICM clinic phone appointment on 12/06/2019.   91 day device clinic remote transmission 12/02/2019.    Copy of ICM check sent to Dr. Rayann Heman.  3 month ICM trend: 11/03/2019    1 Year ICM trend:       Rosalene Billings, RN 11/05/2019 2:12 PM

## 2019-11-05 NOTE — Patient Instructions (Addendum)
Medication Instructions:  Your physician recommends that you continue on your current medications as directed. Please refer to the Current Medication list given to you today.  *If you need a refill on your cardiac medications before your next appointment, please call your pharmacy*   Lab Work: CBC and BMET on 04/30 If you have labs (blood work) drawn today and your tests are completely normal, you will receive your results only by: Marland Kitchen MyChart Message (if you have MyChart) OR . A paper copy in the mail If you have any lab test that is abnormal or we need to change your treatment, we will call you to review the results.   Testing/Procedures: Your physician has requested that you have a TEE. During a TEE, sound waves are used to create images of your heart. It provides your doctor with information about the size and shape of your heart and how well your heart's chambers and valves are working. In this test, a transducer is attached to the end of a flexible tube that's guided down your throat and into your esophagus (the tube leading from you mouth to your stomach) to get a more detailed image of your heart. You are not awake for the procedure. Please see the instruction sheet given to you today. For further information please visit [email or web address].   Follow-Up: At [facility], you and your health needs are our priority.  As part of our continuing mission to provide you with exceptional heart care, we have created designated Provider Care Teams.  These Care Teams include your primary Cardiologist (physician) and Advanced Practice Providers (APPs -  Physician Assistants and Nurse Practitioners) who all work together to provide you with the care you need, when you need it.  Your next appointment:   6 month(s)  The format for your next appointment:   In Person  Provider:   You may see Fransico Him, MD or one of the following Advanced Practice Providers on your designated Care Team:     Melina Copa, PA-C  Ermalinda Barrios, PA-C  Other Instructions  You are scheduled for a TEE on May 3rd, 2021 with Dr. Harrington Challenger.  Please arrive at the [facility] (Main Entrance A) at [facility]: [address] [place], [place] [address] at 8:00 am. (1 hour prior to procedure unless lab work is needed; if lab work is needed arrive 1.5 hours ahead)  DIET: Nothing to eat or drink after midnight except a sip of water with medications (see medication instructions below)  Labs: Come to the lab at [facility] between the hours of 8:00 am and 4:30 pm. You do not have to be fasting.  You must have a responsible person to drive you home and stay in the waiting area during your procedure. Failure to do so could result in cancellation.  Bring your insurance cards.  *Special Note: Every effort is made to have your procedure done on time. Occasionally there are emergencies that occur at the hospital that may cause delays. Please be patient if a delay does occur.

## 2019-11-05 NOTE — Addendum Note (Signed)
Addended by: Antonieta Iba on: 11/05/2019 02:11 PM   Modules accepted: Orders

## 2019-11-05 NOTE — Progress Notes (Signed)
Virtual Visit via Telephone Note   This visit type was conducted due to national recommendations for restrictions regarding the COVID-19 Pandemic (e.g. social distancing) in an effort to limit this patient's exposure and mitigate transmission in our community.  Due to his co-morbid illnesses, this patient is at least at moderate risk for complications without adequate follow up.  This format is felt to be most appropriate for this patient at this time.  The patient did not have access to video technology/had technical difficulties with video requiring transitioning to audio format only (telephone).  All issues noted in this document were discussed and addressed.  No physical exam could be performed with this format.  Please refer to the patient's chart for his  consent to telehealth for [facility].  Evaluation Performed:  Follow-up visit  This visit type was conducted due to national recommendations for restrictions regarding the COVID-19 Pandemic (e.g. social distancing).  This format is felt to be most appropriate for this patient at this time.  All issues noted in this document were discussed and addressed.  No physical exam was performed (except for noted visual exam findings with Video Visits).  Please refer to the patient's chart (MyChart message for video visits and phone note for telephone visits) for the patient's consent to telehealth for [facility].  Date:  11/05/2019   ID:  Anthony Daugherty, DOB Jun 25, 1946, MRN [id number]  Patient Location:  Home  Provider location:   [place]  PCP:  Tammi Sou, MD  Cardiologist:  Fransico Him, MD  Electrophysiologist:  None   Chief Complaint:  DCM and AI  History of Present Illness:    Anthony Daugherty is a 74 y.o. male who presents via audio/video conferencing for a telehealth visit today.    This is a 74yo male with a history of aortic stenosis and AR with dilated aortic root. He apparently had an echo 07/2017  showing mild aortic stenosis and mild to moderate AR and aortic root 70mm. He also has a history of HTN, hyperlipidemia, nonischemic DCM with EF 40-45 by cath and echo in 2012 and ASCAD with 80% OM lesion medically managed.   Repeat echo 08/2018 showed moderate AR with aortic root 7mm. Unfortunately his echo also shows that his LVF has significantly declined with EF 20-25% with mildly dilated LV and diffuse HK. There was severe LAE and moderate RAE and mild pulmonary HTN. At his initial OV in [id number], his Bystolic was changed to Carvedilol and he was continued on ARB.    He underwent cath 12/2018 showing 95% pRCA, 75% OM1 and 50% prox to mid LAD.  He underwent attempted PCI of the RCA but unable to cross the lesion and medical therapy was recommended.  It was felt that his CM was mixed ICM/NICM with possible ETOH contribution as well.  He has seen my extenders several times trying to titrate HF meds but has been limited by soft Bps.  He was on Entresto 49/51mg  BID, Carvedilol 25mg  BID and repeat echo 04/2019 showed EF 25-30% with mils AS and moderate to severe AI.  There was no M spike on SPEP/UPEP and TSH and ferritin were normal.  He was referred to EP and underwent [employer] BiV ICD on 06/01/2019.    He had a followup 2D echo on 10/26/2018 showing low normal LVR with EF 50% with mildly dilated LV, moderate RVE with mildly reduced RVF, moderate to severe AR with flow reversal in the descending aorta with mild AS  and dilated aortic root at 46mm.  Compared to prior echo, the AI has worsened.  He is now here for followup to discuss TEE.  The patient does not have symptoms concerning for COVID-19 infection (fever, chills, cough, or new shortness of breath).    Prior CV studies:   The following studies were reviewed today:  2D echo 10/26/2019  Past Medical History:  Diagnosis Date  . Aortic regurgitation    Mild-mod on echo 07/2017.  Moderate 08/2018.  Moderate-severe on echo 04/2019.  Marland Kitchen Aortic  stenosis, mild   . Arthritis    L-Spine  . Ascending aorta dilatation (HCC)   . CARDIAC MURMUR [id number]; [id number]   Systolic and diastolic as of [id number].  . Cardiomyopathy ([place]) 10/12/2010   Global hypokinesis, EF 40-45% by cath and echo (2012 echo and 2019 echo).  Feb 2020 EF 20-25%. - mixed ICM/NICM (alcohol and CAD)  October 2020 EF 25-30%.  . Chronic combined systolic and diastolic CHF (congestive heart failure) (HCC)    max med mgmt, plus CRT-D placed 05/2019.  Marland Kitchen Coronary artery disease 11/16/2010   a. 2012 - 80% OM lesion. b. Cath 12/2018 - showing 95% prox RCA, 75% OM1, 50% prox-mid LAD. Unsuccessful PCI to 100% chronically occluded RCA summer 2020.  . Dilated aortic root ([place]) 10/21/2018  . Diverticulosis   . Erectile dysfunction 08/16/2011  . ESSENTIAL HYPERTENSION 07/19/2010   Qualifier: Diagnosis of  By: Anitra Lauth M.D., Brien Few   . GAD (generalized anxiety disorder)    managed by Dr. Toy Care with prozac 20mg  qd  . Gout    Usually Right great toe; colchicine helps well  . Habitual alcohol use   . Hx of colonoscopy 2004; 2014   Initial was normal; 2014-polypectomy and diverticulosis.  Recall 2019.  Marland Kitchen Hyperlipidemia 10/23/2012  . LBBB (left bundle branch block)    CRT-D placed 05/2019  . Low back strain 10/24/2013  . Obesity, Class I, BMI 30-34.9   . Thyroid nodule 04/23/2013  . TOBACCO ABUSE 08/13/2010   Past Surgical History:  Procedure Laterality Date  . BIV ICD INSERTION CRT-D N/A 06/01/2019   Procedure: BIV ICD INSERTION CRT-D;  Surgeon: [name], MD;  Location: [facility];  Service: Cardiovascular;  Laterality: N/A;  . CARDIAC CATHETERIZATION  10/12/10   diagnostic only: 1 vessel CAD with TIMI 3 flow, EF 40-45%.  Mild AS.  Medical mgmt of CAD.  Marland Kitchen COLONOSCOPY    . COLONOSCOPY W/ POLYPECTOMY  10/06/12   Diverticulosis and tubular adenoma w/out high grade dysplasia.  Repeat 5 yrs (Dr. Deatra Ina).  . CORONARY ATHERECTOMY N/A 01/08/2019   100% chronic RCA occlusion  impossible to clear.  Med mgmt. Procedure: CORONARY ATHERECTOMY;  Surgeon: Sherren Mocha, MD;  Location: [facility];  Service: Cardiovascular;  Laterality: N/A;  . POLYPECTOMY    . RIGHT HEART CATH AND CORONARY ANGIOGRAPHY N/A 12/24/2018   95% RCA occlusion, 75% obtuse marginal occlusion (3V dz). Procedure: RIGHT HEART CATH AND CORONARY ANGIOGRAPHY;  Surgeon: Lorretta Harp, MD;  Location: [facility];  Service: Cardiovascular;  Laterality: N/A;  . TRANSTHORACIC ECHOCARDIOGRAM  09/2010; 07/2017; 08/2018; 10/2019   2012:  EF 40-45%, global hypokinesis, mild AS.  07/2017: no interval chg except mild/mod AR.  08/2018 EF 20-25%, global LV hypok, DD, biatrial enlargemt, mod AR, aortic root 3.8 cm. 10/2019 EF 50%, mild dec RV fxn, mod-sev AR, aor root dil 42 mm-->TEE to be done     Current Meds  Medication Sig  .  acetaminophen (TYLENOL) 500 MG tablet Take 500-1,000 mg by mouth every 6 (six) hours as needed (for pain.).  Marland Kitchen aspirin 81 MG tablet Take 81 mg by mouth daily.    Marland Kitchen atorvastatin (LIPITOR) 40 MG tablet Take 1 tablet (40 mg total) by mouth daily.  . carvedilol (COREG) 25 MG tablet Take 1 tablet (25 mg total) by mouth 2 (two) times daily.  . colchicine 0.6 MG tablet TAKE 2 TABLETS AT ONSET OF GOUT PAIN, REPEAT IN 1 HOUR, THEN 1 TABLETTWICE/DAY TIL RESOLVED  . ENTRESTO 49-51 MG TAKE  (1)  TABLET TWICE A DAY.  Marland Kitchen FLUoxetine (PROZAC) 20 MG capsule Take 20 mg by mouth daily.      Allergies:   Penicillins   Social History   Tobacco Use  . Smoking status: Former Research scientist (life sciences)  . Smokeless tobacco: Current User    Types: Chew  . Tobacco comment: chews tobacco  Substance Use Topics  . Alcohol use: Yes    Alcohol/week: 28.0 standard drinks    Types: 28 Cans of beer per week  . Drug use: No     Family Hx: The patient's family history includes Alzheimer's disease in his mother; COPD in his father; Congenital heart disease in his brother; Heart failure in his father. There is no history  of Colon cancer, Esophageal cancer, Stomach cancer, Rectal cancer, or Colon polyps.  ROS:   Please see the history of present illness.     All other systems reviewed and are negative.   Labs/Other Tests and Data Reviewed:    Recent Labs: 12/21/2018: TSH 2.350 05/28/2019: Hemoglobin 13.2; Platelets 205 09/21/2019: ALT 11; BUN 15; Creatinine, Ser 0.92; Potassium 4.1; Sodium 136   Recent Lipid Panel Lab Results  Component Value Date/Time   CHOL 143 09/21/2019 11:26 AM   CHOL 141 04/26/2019 09:12 AM   TRIG 58.0 09/21/2019 11:26 AM   TRIG 105 05/10/2008 12:00 AM   HDL 62.10 09/21/2019 11:26 AM   HDL 72 04/26/2019 09:12 AM   CHOLHDL 2 09/21/2019 11:26 AM   LDLCALC 70 09/21/2019 11:26 AM   LDLCALC 57 04/26/2019 09:12 AM   LDLCALC 101 05/10/2008 12:00 AM    Wt Readings from Last 3 Encounters:  11/05/19 175 lb (79.4 kg)  10/25/19 176 lb 12.8 oz (80.2 kg)  09/21/19 178 lb (80.7 kg)     Objective:    Vital Signs:  BP 139/72   Pulse 80   Ht 5\' 5"  (1.651 m)   Wt 175 lb (79.4 kg)   BMI 29.12 kg/m     ASSESSMENT & PLAN:    1.  Aortic insufficiency -recent 2D echo showed moderate to severe aortic insufficiency -I have recommended that we proceed with TEE to better determine severity -The risks and benefits of transesophageal echocardiogram have been explained including risks of esophageal damage, perforation (1:10,000 risk), bleeding, pharyngeal hematoma as well as other potential complications associated with conscious sedation including aspiration, arrhythmia, respiratory failure and death. Alternatives to treatment were discussed, questions were answered. Patient is willing to proceed.   2.  Severe mixed DCM -echo 04/2019 was 25-30% despite maximum tolerated HF meds -now s/p BiV ICD in Nov 2020 -repeat EF 10/2019 showed improved LVF with EF 50% -continue Entresto 49-51mg  BID, Carvedilol 25mg  BID, spiro 12.5mg  daily  3.  Chronic combined systolic/diastolic CHF -he is NYHA  class 2a -SOB is much improved and he denies any LE edema -continue Entresto 49-51mg  BID, carvedilol 25mg  BID, spiro 12.5mg  daily -Creatinine 0.92 and K+  4.1 in March 2021  4.  Mild ascending aortic and aortic root aneurysms -2D echo 04/2019 showed 4.3cm aortic root and 4.4cm Ascending aorta -22mm on 2D echo -get Chest CTA to assess further as well as abomen  5.  ASCAD - cath 12/2018 showing 95% pRCA, 75% OM1 and 50% prox to mid LAD.  He underwent attempted PCI of the RCA but unable to cross the lesion and medical therapy was recommended. -he has not had any angina -continue ASA, BB and statin  6.  HLD -LDL goal < 70 -LDL was 70 in March 2021 -continue atorvastatin 40mg  daily   COVID-19 Education: The signs and symptoms of COVID-19 were discussed with the patient and how to seek care for testing (follow up with PCP or arrange E-visit).  The importance of social distancing was discussed today.  Patient Risk:   After full review of this patient's clinical status, I feel that they are at least moderate risk at this time.  Time:   Today, I have spent 20 minutes on telemedicine discussing medical problems including AI, DCM, aortic aneurysm, CAD, HLD, HTN and reviewing patient's chart including 2D echo and reviewed risks and benefits of TEE.  Medication Adjustments/Labs and Tests Ordered: Current medicines are reviewed at length with the patient today.  Concerns regarding medicines are outlined above.  Tests Ordered: No orders of the defined types were placed in this encounter.  Medication Changes: No orders of the defined types were placed in this encounter.   Disposition:  Follow up in 6 month(s)  Signed, Fransico Him, MD  11/05/2019 1:09 PM    [place]

## 2019-11-08 ENCOUNTER — Ambulatory Visit: Payer: Medicare PPO | Admitting: Gastroenterology

## 2019-11-08 ENCOUNTER — Other Ambulatory Visit: Payer: Self-pay

## 2019-11-08 ENCOUNTER — Encounter: Payer: Self-pay | Admitting: Family Medicine

## 2019-11-08 VITALS — BP 71/39 | HR 60 | Temp 97.1°F | Ht 65.0 in | Wt 176.8 lb

## 2019-11-08 MED ORDER — SODIUM CHLORIDE 0.9 % IV SOLN: 500.0000 mL | Freq: Once | INTRAVENOUS | Status: DC

## 2019-11-08 NOTE — Progress Notes (Signed)
Spoke with patient and evaluated him pre-procedure. He had a recent echo showing improved EF to 50% however also noted to have severe AR which I am now just aware of. Patient having TEE to evaluate this next week and may need valve repair / replacement. I discussed the situation with the patient and his wife. While unfortunately he is prepped for a colonoscopy today, his colonoscopy is for surveillance purposes and is elective. I think the safest thing right now is to cancel this elective procedure until his TEE is done and determine if he needs therapy for his valve. I spoke at length with the patient and his wife about this and they agreed to postpone the procedure today. He will follow up with cardiology and call to reschedule once cleared from cardiology or following valve treatment if he has that done. All questions answered, he agreed.

## 2019-11-08 NOTE — Progress Notes (Addendum)
Pt's b/p 71/39 HR 60 when initally brought back in admitting Anthony Daugherty, CMA report to me.  Anthony Hiss, RN manually took pressure 112/78 and taken a second time manually and was the same.   Pt report he was having open heart surgery in May. Pt did not know details when asked.  He gave our nurse permission to speak with his wife. I asked   Anthony Hiss, RN to speak with Anthony Daugherty, pt's wife. She said her husband was having TEE Nov 15, 2019 and depending on the results from TEE MD will decide if pt needs open heart surgery.   Anthony Daugherty reported to Dr. Havery Daugherty and Anthony Rand, CRNA. They reviewed pt's records and have decided to cancel this procedure today.  Dr. Purnell Daugherty wants pt to have his TEE procedure first and see if pt will need a valve replacement. Pt.to have cardiac interventions First.   Pt to call back after cardiac issues have been address to reschedule colonoscopy for history of polyps.  Dr. Havery Daugherty spoke with pt and called his wife and explained to her as well. [place]

## 2019-11-09 ENCOUNTER — Telehealth: Payer: Self-pay

## 2019-11-09 DIAGNOSIS — I712 Thoracic aortic aneurysm, without rupture, unspecified: Secondary | ICD-10-CM

## 2019-11-09 NOTE — Telephone Encounter (Signed)
Orders placed for abdominal CT

## 2019-11-12 ENCOUNTER — Other Ambulatory Visit: Payer: Medicare PPO | Admitting: *Deleted

## 2019-11-12 ENCOUNTER — Other Ambulatory Visit (HOSPITAL_COMMUNITY)
Admission: RE | Admit: 2019-11-12 | Discharge: 2019-11-12 | Disposition: A | Discharge status: Home / Self Care | Payer: Medicare PPO | Source: Ambulatory Visit | Attending: Internal Medicine | Admitting: Internal Medicine

## 2019-11-12 ENCOUNTER — Other Ambulatory Visit: Payer: Self-pay

## 2019-11-12 DIAGNOSIS — I5022 Chronic systolic (congestive) heart failure: Secondary | ICD-10-CM | POA: Diagnosis not present

## 2019-11-12 DIAGNOSIS — Z01812 Encounter for preprocedural laboratory examination: Secondary | ICD-10-CM | POA: Diagnosis not present

## 2019-11-12 DIAGNOSIS — Z20822 Contact with and (suspected) exposure to covid-19: Secondary | ICD-10-CM | POA: Insufficient documentation

## 2019-11-13 LAB — SPECIMEN STATUS

## 2019-11-13 LAB — SARS CORONAVIRUS 2 (TAT 6-24 HRS): SARS Coronavirus 2: NEGATIVE

## 2019-11-15 ENCOUNTER — Encounter (HOSPITAL_COMMUNITY): Payer: Self-pay | Admitting: Internal Medicine

## 2019-11-15 ENCOUNTER — Encounter (HOSPITAL_COMMUNITY)
Admission: RE | Disposition: A | Discharge status: Home / Self Care | Payer: Self-pay | Source: Home / Self Care | Attending: Internal Medicine

## 2019-11-15 ENCOUNTER — Ambulatory Visit (HOSPITAL_COMMUNITY): Payer: Medicare PPO | Admitting: Anesthesiology

## 2019-11-15 ENCOUNTER — Ambulatory Visit (HOSPITAL_BASED_OUTPATIENT_CLINIC_OR_DEPARTMENT_OTHER): Payer: Medicare PPO

## 2019-11-15 ENCOUNTER — Other Ambulatory Visit: Payer: Self-pay

## 2019-11-15 ENCOUNTER — Ambulatory Visit (HOSPITAL_COMMUNITY)
Admission: RE | Admit: 2019-11-15 | Discharge: 2019-11-15 | Disposition: A | Discharge status: Home / Self Care | Payer: Medicare PPO | Attending: Internal Medicine | Admitting: Internal Medicine

## 2019-11-15 DIAGNOSIS — I429 Cardiomyopathy, unspecified: Secondary | ICD-10-CM | POA: Diagnosis not present

## 2019-11-15 DIAGNOSIS — F17290 Nicotine dependence, other tobacco product, uncomplicated: Secondary | ICD-10-CM | POA: Insufficient documentation

## 2019-11-15 DIAGNOSIS — I5022 Chronic systolic (congestive) heart failure: Secondary | ICD-10-CM | POA: Diagnosis not present

## 2019-11-15 DIAGNOSIS — I714 Abdominal aortic aneurysm, without rupture: Secondary | ICD-10-CM | POA: Insufficient documentation

## 2019-11-15 DIAGNOSIS — I5042 Chronic combined systolic (congestive) and diastolic (congestive) heart failure: Secondary | ICD-10-CM | POA: Diagnosis not present

## 2019-11-15 DIAGNOSIS — I110 Hypertensive heart disease with heart failure: Secondary | ICD-10-CM | POA: Diagnosis not present

## 2019-11-15 DIAGNOSIS — F411 Generalized anxiety disorder: Secondary | ICD-10-CM | POA: Diagnosis not present

## 2019-11-15 DIAGNOSIS — I352 Nonrheumatic aortic (valve) stenosis with insufficiency: Secondary | ICD-10-CM | POA: Diagnosis not present

## 2019-11-15 DIAGNOSIS — Z79899 Other long term (current) drug therapy: Secondary | ICD-10-CM | POA: Diagnosis not present

## 2019-11-15 DIAGNOSIS — I2510 Atherosclerotic heart disease of native coronary artery without angina pectoris: Secondary | ICD-10-CM | POA: Diagnosis not present

## 2019-11-15 DIAGNOSIS — I447 Left bundle-branch block, unspecified: Secondary | ICD-10-CM | POA: Diagnosis not present

## 2019-11-15 DIAGNOSIS — M109 Gout, unspecified: Secondary | ICD-10-CM | POA: Insufficient documentation

## 2019-11-15 DIAGNOSIS — Z880 Allergy status to penicillin: Secondary | ICD-10-CM | POA: Diagnosis not present

## 2019-11-15 DIAGNOSIS — Z6828 Body mass index (BMI) 28.0-28.9, adult: Secondary | ICD-10-CM | POA: Insufficient documentation

## 2019-11-15 DIAGNOSIS — Z7982 Long term (current) use of aspirin: Secondary | ICD-10-CM | POA: Diagnosis not present

## 2019-11-15 DIAGNOSIS — I351 Nonrheumatic aortic (valve) insufficiency: Secondary | ICD-10-CM | POA: Diagnosis not present

## 2019-11-15 DIAGNOSIS — I081 Rheumatic disorders of both mitral and tricuspid valves: Secondary | ICD-10-CM | POA: Diagnosis not present

## 2019-11-15 DIAGNOSIS — E785 Hyperlipidemia, unspecified: Secondary | ICD-10-CM | POA: Diagnosis not present

## 2019-11-15 DIAGNOSIS — E669 Obesity, unspecified: Secondary | ICD-10-CM | POA: Diagnosis not present

## 2019-11-15 HISTORY — PX: TEE WITHOUT CARDIOVERSION: SHX5443

## 2019-11-15 LAB — BASIC METABOLIC PANEL
BUN/Creatinine Ratio: 15 (ref 10–24)
BUN: 14 mg/dL (ref 8–27)
CO2: 22 mmol/L (ref 20–29)
Calcium: 8.6 mg/dL (ref 8.6–10.2)
Chloride: 106 mmol/L (ref 96–106)
Creatinine, Ser: 0.93 mg/dL (ref 0.76–1.27)
GFR calc Af Amer: 94 mL/min/{1.73_m2} (ref >59)
GFR calc non Af Amer: 81 mL/min/{1.73_m2} (ref >59)
Glucose: 96 mg/dL (ref 65–99)
Potassium: 4.2 mmol/L (ref 3.5–5.2)
Sodium: 139 mmol/L (ref 134–144)

## 2019-11-15 LAB — CBC WITH DIFFERENTIAL/PLATELET
Basophils Absolute: 0 10*3/uL (ref 0.0–0.2)
Basos: 1 %
EOS (ABSOLUTE): 0.2 10*3/uL (ref 0.0–0.4)
Eos: 4 %
Hematocrit: 39 % (ref 37.5–51.0)
Hemoglobin: 13.6 g/dL (ref 13.0–17.7)
Immature Grans (Abs): 0 10*3/uL (ref 0.0–0.1)
Immature Granulocytes: 0 %
Lymphocytes Absolute: 0.8 10*3/uL (ref 0.7–3.1)
Lymphs: 22 %
MCH: 32.5 pg (ref 26.6–33.0)
MCHC: 34.9 g/dL (ref 31.5–35.7)
MCV: 93 fL (ref 79–97)
Monocytes Absolute: 0.3 10*3/uL (ref 0.1–0.9)
Monocytes: 8 %
Neutrophils Absolute: 2.3 10*3/uL (ref 1.4–7.0)
Neutrophils: 65 %
Platelets: 189 10*3/uL (ref 150–450)
RBC: 4.18 x10E6/uL (ref 4.14–5.80)
RDW: 13.3 % (ref 11.6–15.4)
WBC: 3.5 10*3/uL (ref 3.4–10.8)

## 2019-11-15 LAB — SPECIMEN STATUS REPORT

## 2019-11-15 SURGERY — ECHOCARDIOGRAM, TRANSESOPHAGEAL
Anesthesia: Monitor Anesthesia Care

## 2019-11-15 MED ORDER — SODIUM CHLORIDE 0.9 % IV SOLN: INTRAVENOUS | Status: DC

## 2019-11-15 MED ORDER — LACTATED RINGERS IV SOLN: INTRAVENOUS | Status: DC

## 2019-11-15 MED ORDER — PROPOFOL 500 MG/50ML IV EMUL
INTRAVENOUS | Status: DC | PRN
  Administered 2019-11-15: 75 ug/kg/min via INTRAVENOUS

## 2019-11-15 MED ORDER — LIDOCAINE 2% (20 MG/ML) 5 ML SYRINGE
INTRAMUSCULAR | Status: DC | PRN
  Administered 2019-11-15: 40 mg via INTRAVENOUS

## 2019-11-15 MED ORDER — GLYCOPYRROLATE 0.2 MG/ML IJ SOLN
INTRAMUSCULAR | Status: DC | PRN
  Administered 2019-11-15: .2 mg via INTRAVENOUS

## 2019-11-15 MED ORDER — LIDOCAINE VISCOUS HCL 2 % MT SOLN
OROMUCOSAL | Status: DC | PRN
  Administered 2019-11-15: 20 mL via OROMUCOSAL

## 2019-11-15 MED ORDER — LIDOCAINE VISCOUS HCL 2 % MT SOLN
OROMUCOSAL | Status: AC
  Filled 2019-11-15: qty 15

## 2019-11-15 NOTE — Anesthesia Preprocedure Evaluation (Signed)
Anesthesia Evaluation  Patient identified by MRN, date of birth, ID band Patient awake    Reviewed: Allergy & Precautions, H&P , NPO status , Patient's Chart, lab work & pertinent test results  Airway Mallampati: II   Neck ROM: full    Dental   Pulmonary former smoker,    breath sounds clear to auscultation       Cardiovascular hypertension, + CAD and +CHF  + Cardiac Defibrillator + Valvular Problems/Murmurs AS and AI  Rhythm:regular Rate:Normal  Global HK. Prior studies measure EF anywhere between 25-50%.   Neuro/Psych PSYCHIATRIC DISORDERS Anxiety  Neuromuscular disease    GI/Hepatic   Endo/Other    Renal/GU      Musculoskeletal  (+) Arthritis ,   Abdominal   Peds  Hematology   Anesthesia Other Findings   Reproductive/Obstetrics                            Anesthesia Physical Anesthesia Plan  ASA: III  Anesthesia Plan: MAC   Post-op Pain Management:    Induction: Intravenous  PONV Risk Score and Plan: 2 and Propofol infusion and Treatment may vary due to age or medical condition  Airway Management Planned: Nasal Cannula  Additional Equipment:   Intra-op Plan:   Post-operative Plan:   Informed Consent: I have reviewed the patients History and Physical, chart, labs and discussed the procedure including the risks, benefits and alternatives for the proposed anesthesia with the patient or authorized representative who has indicated his/her understanding and acceptance.       Plan Discussed with: CRNA, Anesthesiologist and Surgeon  Anesthesia Plan Comments:         Anesthesia Quick Evaluation

## 2019-11-15 NOTE — Interval H&P Note (Signed)
History and Physical Interval Note:  11/15/2019 9:03 AM  Anthony Daugherty  has presented today for surgery, with the diagnosis of AORTIC INSUFFICIENCY.  The various methods of treatment have been discussed with the patient and family. After consideration of risks, benefits and other options for treatment, the patient has consented to  Procedure(s): TRANSESOPHAGEAL ECHOCARDIOGRAM (TEE) (N/A) as a surgical intervention.  The patient's history has been reviewed, patient examined, no change in status, stable for surgery.  I have reviewed the patient's chart and labs.  Questions were answered to the patient's satisfaction.     Dorris Carnes

## 2019-11-15 NOTE — Anesthesia Procedure Notes (Signed)
Procedure Name: MAC Date/Time: 11/15/2019 9:22 AM Performed by: Kyung Rudd, CRNA Pre-anesthesia Checklist: Patient identified, Emergency Drugs available, Suction available, Patient being monitored and Timeout performed Patient Re-evaluated:Patient Re-evaluated prior to induction Oxygen Delivery Method: Nasal cannula Induction Type: IV induction Placement Confirmation: positive ETCO2 Dental Injury: Teeth and Oropharynx as per pre-operative assessment

## 2019-11-15 NOTE — Discharge Instructions (Signed)

## 2019-11-15 NOTE — Transfer of Care (Signed)
Immediate Anesthesia Transfer of Care Note  Patient: Anthony Daugherty  Procedure(s) Performed: TRANSESOPHAGEAL ECHOCARDIOGRAM (TEE) (N/A )  Patient Location: Endoscopy Unit  Anesthesia Type:MAC  Level of Consciousness: awake, alert  and oriented  Airway & Oxygen Therapy: Patient Spontanous Breathing and Patient connected to nasal cannula oxygen  Post-op Assessment: Report given to RN, Post -op Vital signs reviewed and stable and Patient moving all extremities X 4  Post vital signs: Reviewed and stable  Last Vitals:  Vitals Value Taken Time  BP 95/62 11/15/19 1010  Temp 36.7 C 11/15/19 1010  Pulse 71 11/15/19 1013  Resp 17 11/15/19 1013  SpO2 97 % 11/15/19 1013  Vitals shown include unvalidated device data.  Last Pain:  Vitals:   11/15/19 1010  TempSrc: Oral  PainSc: 0-No pain         Complications: No apparent anesthesia complications

## 2019-11-15 NOTE — Progress Notes (Signed)
  Echocardiogram 2D Echocardiogram has been performed.  Jennette Dubin 11/15/2019, 10:27 AM

## 2019-11-16 ENCOUNTER — Telehealth: Payer: Self-pay | Admitting: Student

## 2019-11-16 NOTE — Progress Notes (Signed)
Electrophysiology Office Note Date: 11/17/2019  ID:  Anthony Daugherty 16-Oct-1945, MRN [id number]  PCP: Tammi Sou, MD Primary Cardiologist: Fransico Him, MD Electrophysiologist: [name], MD   CC: Routine ICD follow-up  Anthony Daugherty is a 74 y.o. male seen today for [name], MD for routine electrophysiology followup.  Since last being seen in our clinic the patient reports doing very well. He denies chest pain, palpitations, dyspnea, PND, orthopnea, nausea, vomiting, dizziness, syncope, edema, weight gain, or early satiety. He has not had ICD shocks.   Device History: St. Jude BiV ICD implanted 05/2019 for chronic systolic CHF and LBBB History of appropriate therapy: No History of AAD therapy: No   Past Medical History:  Diagnosis Date  . Aortic regurgitation    Mild-mod on echo 07/2017.  Moderate 08/2018.  Moderate-severe on echo 04/2019--> TEE 11/15/19 EF 50-55%, no LV dilation or WMA. Mod AV sclerosis w/out stenosis, mod-to-sev AI  . Aortic stenosis, mild   . Arthritis    L-Spine  . Ascending aorta dilatation (HCC)    to get CT angio as of 10/2019  . CARDIAC MURMUR [id number]; [id number]   Systolic and diastolic as of 123456.  . Cardiomyopathy ([place]) 10/12/2010   Global hypokinesis, EF 40-45% by cath and echo (2012 echo and 2019 echo).  Feb 2020 EF 20-25%. - mixed ICM/NICM (alcohol and CAD)  October 2020 EF 25-30%.  . Chronic combined systolic and diastolic CHF (congestive heart failure) (HCC)    max med mgmt, plus CRT-D placed 05/2019, EF improved to 50% after.  . Coronary artery disease 11/16/2010   a. 2012 - 80% OM lesion. b. Cath 12/2018 - showing 95% prox RCA, 75% OM1, 50% prox-mid LAD. Unsuccessful PCI to 100% chronically occluded RCA summer 2020.  . Dilated aortic root ([place]) 10/21/2018  . Diverticulosis   . Erectile dysfunction 08/16/2011  . ESSENTIAL HYPERTENSION 07/19/2010   Qualifier: Diagnosis of  By: Anitra Lauth M.D., Brien Few   . GAD (generalized  anxiety disorder)    managed by Dr. Toy Care with prozac 20mg  qd  . Gout    Usually Right great toe; colchicine helps well  . Habitual alcohol use   . Hx of colonoscopy 2004; 2014   Initial was normal; 2014-polypectomy and diverticulosis.  Recall 2019.  Marland Kitchen Hyperlipidemia 10/23/2012  . LBBB (left bundle branch block)    CRT-D placed 05/2019  . Low back strain 10/24/2013  . Obesity, Class I, BMI 30-34.9   . Thyroid nodule 04/23/2013  . TOBACCO ABUSE 08/13/2010   Past Surgical History:  Procedure Laterality Date  . BIV ICD INSERTION CRT-D N/A 06/01/2019   Procedure: BIV ICD INSERTION CRT-D;  Surgeon: [name], MD;  Location: [place] [facility];  Service: Cardiovascular;  Laterality: N/A;  . CARDIAC CATHETERIZATION  10/12/10   diagnostic only: 1 vessel CAD with TIMI 3 flow, EF 40-45%.  Mild AS.  Medical mgmt of CAD.  Marland Kitchen COLONOSCOPY    . COLONOSCOPY W/ POLYPECTOMY  10/06/12   Diverticulosis and tubular adenoma w/out high grade dysplasia.  Repeat 5 yrs (Dr. Deatra Ina).  . CORONARY ATHERECTOMY N/A 01/08/2019   100% chronic RCA occlusion impossible to clear.  Med mgmt. Procedure: CORONARY ATHERECTOMY;  Surgeon: Sherren Mocha, MD;  Location: [place];  Service: Cardiovascular;  Laterality: N/A;  . POLYPECTOMY    . RIGHT HEART CATH AND CORONARY ANGIOGRAPHY N/A 12/24/2018   95% RCA occlusion, 75% obtuse marginal occlusion (3V dz). Procedure: RIGHT HEART CATH AND  CORONARY ANGIOGRAPHY;  Surgeon: Lorretta Harp, MD;  Location: [facility];  Service: Cardiovascular;  Laterality: N/A;  . TEE WITHOUT CARDIOVERSION N/A 11/15/2019   EF 50-55%, no LV dilation or WMA. Mod AV sclerosis w/out stenosis, mod-to-sev AI.  Procedure: TRANSESOPHAGEAL ECHOCARDIOGRAM (TEE);  Surgeon: Fay Records, MD;  Location: [facility];  Service: Cardiovascular;  Laterality: N/A;  . TRANSTHORACIC ECHOCARDIOGRAM  09/2010; 07/2017; 08/2018; 10/2019   2012:  EF 40-45%, global hypokinesis, mild AS.  07/2017: no interval chg  except mild/mod AR.  08/2018 EF 20-25%, global LV hypok, DD, biatrial enlargemt, mod AR, aortic root 3.8 cm. 10/2019 EF 50%, mild dec RV fxn, mod-sev AR, aor root dil 42 mm-->TEE to be done    Current Outpatient Medications  Medication Sig Dispense Refill  . acetaminophen (TYLENOL) 500 MG tablet Take 500-1,000 mg by mouth every 6 (six) hours as needed (for pain.).    Marland Kitchen atorvastatin (LIPITOR) 40 MG tablet Take 40 mg by mouth daily.    . carvedilol (COREG) 25 MG tablet Take 1 tablet (25 mg total) by mouth 2 (two) times daily. 180 tablet 3  . colchicine 0.6 MG tablet Take 0.6 mg by mouth as needed (gout).    Marland Kitchen ENTRESTO 49-51 MG TAKE  (1)  TABLET TWICE A DAY. 60 tablet 10  . FLUoxetine (PROZAC) 20 MG capsule Take 20 mg by mouth daily.      No current facility-administered medications for this visit.    Allergies:   Penicillins   Social History: Social History   Socioeconomic History  . Marital status: Married    Spouse name: Thayer Headings  . Number of children: 2  . Years of education: Not on file  . Highest education level: Not on file  Occupational History  . Occupation: Engineer, structural: [employer]  Tobacco Use  . Smoking status: Former Research scientist (life sciences)  . Smokeless tobacco: Current User    Types: Chew  . Tobacco comment: chews tobacco  Substance and Sexual Activity  . Alcohol use: Yes    Alcohol/week: 28.0 standard drinks    Types: 28 Cans of beer per week  . Drug use: No  . Sexual activity: Yes  Other Topics Concern  . Not on file  Social History Narrative   Delivery driver.  Married, two children.   Long hx of smoking but quit after 2010.      Lives in [place].   Retired delivery person for a [employer]   Social Determinants of Radio broadcast assistant Strain:   . Difficulty of Paying Living Expenses:   Food Insecurity:   . Worried About Charity fundraiser in the Last Year:   . Arboriculturist in the Last Year:   Transportation Needs:   . Lexicographer (Medical):   Marland Kitchen Lack of Transportation (Non-Medical):   Physical Activity:   . Days of Exercise per Week:   . Minutes of Exercise per Session:   Stress:   . Feeling of Stress :   Social Connections:   . Frequency of Communication with Friends and Family:   . Frequency of Social Gatherings with Friends and Family:   . Attends Religious Services:   . Active Member of Clubs or Organizations:   . Attends Archivist Meetings:   Marland Kitchen Marital Status:   Intimate Partner Violence:   . Fear of Current or Ex-Partner:   . Emotionally Abused:   Marland Kitchen Physically  Abused:   . Sexually Abused:     Family History: Family History  Problem Relation Age of Onset  . Congenital heart disease Brother        cardiomyopathy  . Alzheimer's disease Mother   . COPD Father   . Heart failure Father        congestive  . Colon cancer Neg Hx   . Esophageal cancer Neg Hx   . Stomach cancer Neg Hx   . Rectal cancer Neg Hx   . Colon polyps Neg Hx     Review of Systems: All other systems reviewed and are otherwise negative except as noted above.   Physical Exam: Vitals:   11/17/19 0839  BP: 102/62  Pulse: 65  SpO2: 96%  Weight: 174 lb (78.9 kg)  Height: 5\' 5"  (1.651 m)     GEN- The patient is well appearing, alert and oriented x 3 today.   HEENT: normocephalic, atraumatic; sclera clear, conjunctiva pink; hearing intact; oropharynx clear; neck supple, no JVP Lymph- no cervical lymphadenopathy Lungs- Clear to ausculation bilaterally, normal work of breathing.  No wheezes, rales, rhonchi Heart- Regular rate and rhythm, no murmurs, rubs or gallops, PMI not laterally displaced GI- soft, non-tender, non-distended, bowel sounds present, no hepatosplenomegaly Extremities- no clubbing, cyanosis, or edema; DP/PT/radial pulses 2+ bilaterally MS- no significant deformity or atrophy Skin- warm and dry, no rash or lesion; ICD pocket well healed Psych- euthymic mood, full affect Neuro-  strength and sensation are intact  ICD interrogation- reviewed in detail today,  See PACEART report  EKG:  EKG is not ordered today.  Recent Labs: 12/21/2018: TSH 2.350 09/21/2019: ALT 11 11/12/2019: BUN 14; Creatinine, Ser 0.93; Hemoglobin WILL FOLLOW; Hemoglobin 13.6; Platelets WILL FOLLOW; Platelets 189; Potassium 4.2; Sodium 139   Wt Readings from Last 3 Encounters:  11/17/19 174 lb (78.9 kg)  11/15/19 173 lb (78.5 kg)  11/08/19 176 lb 12.8 oz (80.2 kg)     Other studies Reviewed: Additional studies/ records that were reviewed today include: Most recent Echo, most recent TEE, EP notes, ICD implant note, most recent labwork.    Assessment and Plan:  1.  Chronic systolic dysfunction s/p St. Jude CRT-D  Echo has shown normalization of EF s/p CRT . Confirmed on TEE to further assess aortic valve 11/15/2019. NYHA I-II symptoms currently. euvolemic today Stable on an appropriate medical regimen Normal ICD function See Pace Art report No changes necessary today with resolution of systolic dysfunction.  2. Aortic regurgitation, moderate to severe By TEE 11/15/2019 Plan per Dr. Radford Pax.   Current medicines are reviewed at length with the patient today.   The patient does not have concerns regarding his medicines.  The following changes were made today:  none  Labs/ tests ordered today include:  Orders Placed This Encounter  Procedures  . CUP PACEART INCLINIC DEVICE CHECK   Disposition:   Follow up with EP APP in 6 months.    Zoey, Repinski, PA-C  11/17/2019 10:07 AM  [facility] [address] [place] [address] [phone] (office) [phone] (fax)

## 2019-11-16 NOTE — Telephone Encounter (Signed)
Patient aware that it is okay for his wife to attend tomorrows visit.

## 2019-11-16 NOTE — Telephone Encounter (Signed)
Wife of the patient would like to be present during the patient's appointment tomorrow. The patient has a cognitive impairment and needs his wife to be present. Please let the wife know what the office decides

## 2019-11-16 NOTE — Anesthesia Postprocedure Evaluation (Signed)
Anesthesia Post Note  Patient: Anthony Daugherty  Procedure(s) Performed: TRANSESOPHAGEAL ECHOCARDIOGRAM (TEE) (N/A )     Patient location during evaluation: Endoscopy Anesthesia Type: MAC Level of consciousness: awake and alert Pain management: pain level controlled Vital Signs Assessment: post-procedure vital signs reviewed and stable Respiratory status: spontaneous breathing, nonlabored ventilation, respiratory function stable and patient connected to nasal cannula oxygen Cardiovascular status: blood pressure returned to baseline and stable Postop Assessment: no apparent nausea or vomiting Anesthetic complications: no    Last Vitals:  Vitals:   11/15/19 1020 11/15/19 1030  BP: 112/61 134/65  Pulse: 68 66  Resp: 16 14  Temp:    SpO2: 98% 98%    Last Pain:  Vitals:   11/15/19 1030  TempSrc:   PainSc: 0-No pain                 [name] S

## 2019-11-17 ENCOUNTER — Other Ambulatory Visit: Payer: Self-pay

## 2019-11-17 ENCOUNTER — Ambulatory Visit (INDEPENDENT_AMBULATORY_CARE_PROVIDER_SITE_OTHER)
Admission: RE | Admit: 2019-11-17 | Discharge: 2019-11-17 | Disposition: A | Discharge status: Home / Self Care | Payer: Medicare PPO | Source: Ambulatory Visit | Attending: Cardiology | Admitting: Cardiology

## 2019-11-17 ENCOUNTER — Ambulatory Visit: Payer: Medicare PPO | Admitting: Student

## 2019-11-17 ENCOUNTER — Encounter: Payer: Self-pay | Admitting: Student

## 2019-11-17 VITALS — BP 102/62 | HR 65 | Ht 65.0 in | Wt 174.0 lb

## 2019-11-17 DIAGNOSIS — I77810 Thoracic aortic ectasia: Secondary | ICD-10-CM | POA: Diagnosis not present

## 2019-11-17 DIAGNOSIS — I351 Nonrheumatic aortic (valve) insufficiency: Secondary | ICD-10-CM | POA: Diagnosis not present

## 2019-11-17 DIAGNOSIS — I712 Thoracic aortic aneurysm, without rupture, unspecified: Secondary | ICD-10-CM

## 2019-11-17 DIAGNOSIS — I700 Atherosclerosis of aorta: Secondary | ICD-10-CM | POA: Diagnosis not present

## 2019-11-17 DIAGNOSIS — Z95810 Presence of automatic (implantable) cardiac defibrillator: Secondary | ICD-10-CM | POA: Diagnosis not present

## 2019-11-17 HISTORY — DX: Thoracic aortic ectasia: I77.810

## 2019-11-17 LAB — CUP PACEART INCLINIC DEVICE CHECK
Battery Remaining Longevity: 86 mo
Brady Statistic RA Percent Paced: 37 %
Brady Statistic RV Percent Paced: 99 %
Date Time Interrogation Session: 20210505095327
HighPow Impedance: 64.125
Implantable Lead Implant Date: 20201117
Implantable Lead Implant Date: 20201117
Implantable Lead Implant Date: 20201117
Implantable Lead Location: 753858
Implantable Lead Location: 753859
Implantable Lead Location: 753860
Implantable Pulse Generator Implant Date: 20201117
Lead Channel Impedance Value: 400 Ohm
Lead Channel Impedance Value: 425 Ohm
Lead Channel Impedance Value: 762.5 Ohm
Lead Channel Pacing Threshold Amplitude: 0.5 V
Lead Channel Pacing Threshold Amplitude: 0.75 V
Lead Channel Pacing Threshold Amplitude: 0.75 V
Lead Channel Pacing Threshold Amplitude: 0.875 V
Lead Channel Pacing Threshold Pulse Width: 0.5 ms
Lead Channel Pacing Threshold Pulse Width: 0.5 ms
Lead Channel Pacing Threshold Pulse Width: 0.5 ms
Lead Channel Pacing Threshold Pulse Width: 0.5 ms
Lead Channel Sensing Intrinsic Amplitude: 11.8 mV
Lead Channel Sensing Intrinsic Amplitude: 2.1 mV
Lead Channel Setting Pacing Amplitude: 1.375
Lead Channel Setting Pacing Amplitude: 1.5 V
Lead Channel Setting Pacing Amplitude: 2 V
Lead Channel Setting Pacing Pulse Width: 0.5 ms
Lead Channel Setting Pacing Pulse Width: 0.5 ms
Lead Channel Setting Sensing Sensitivity: 0.5 mV
Pulse Gen Serial Number: 111012743

## 2019-11-17 MED ORDER — IOHEXOL 350 MG/ML SOLN
100.0000 mL | Freq: Once | INTRAVENOUS | Status: AC | PRN
  Administered 2019-11-17: 100 mL via INTRAVENOUS

## 2019-11-17 NOTE — Patient Instructions (Addendum)
Medication Instructions:  NONE *If you need a refill on your cardiac medications before your next appointment, please call your pharmacy*   Lab Work: NONE If you have labs (blood work) drawn today and your tests are completely normal, you will receive your results only by: Marland Kitchen MyChart Message (if you have MyChart) OR . A paper copy in the mail If you have any lab test that is abnormal or we need to change your treatment, we will call you to review the results.   Testing/Procedures: NONE   Follow-Up: At [facility], you and your health needs are our priority.  As part of our continuing mission to provide you with exceptional heart care, we have created designated Provider Care Teams.  These Care Teams include your primary Cardiologist (physician) and Advanced Practice Providers (APPs -  Physician Assistants and Nurse Practitioners) who all work together to provide you with the care you need, when you need it.  Your next appointment:   6 months  The format for your next appointment:   Either In Person or Virtual  Provider:   Dr Rayann Heman   Other Instructions Remote monitoring is used to monitor your ICD from home. This monitoring reduces the number of office visits required to check your device to one time per year. It allows [place] to keep an eye on the functioning of your device to ensure it is working properly. You are scheduled for a device check from home on 03/02/20. You may send your transmission at any time that day. If you have a wireless device, the transmission will be sent automatically. After your physician reviews your transmission, you will receive a postcard with your next transmission date.

## 2019-11-18 ENCOUNTER — Telehealth: Payer: Self-pay

## 2019-11-18 ENCOUNTER — Encounter: Payer: Self-pay | Admitting: Family Medicine

## 2019-11-18 DIAGNOSIS — I712 Thoracic aortic aneurysm, without rupture, unspecified: Secondary | ICD-10-CM

## 2019-11-18 DIAGNOSIS — I77810 Thoracic aortic ectasia: Secondary | ICD-10-CM

## 2019-11-18 NOTE — Telephone Encounter (Signed)
-----   Message from Sueanne Margarita, MD sent at 11/17/2019 10:25 PM EDT ----- Chest CT showed mild to moderate aortic aneurysm at the sinuses of Valsalva measuring 58mm but no evidence of abdominal aortic aneurysm.  There are coronary and aortic calcifications. Consistent with aortic atherosclerosis along the entire thoracic and abdominal aorta.    There is a 0.5cm subpleural nodule in the RLL that needs followup in 12 months due to hx of tobacco use - please forward scan to PCP to followup on this.  Last lipids were at goal .  Please refer to Dr. Cyndia Bent for evaluation and to follow dilated sinuses of Valsalva.

## 2019-11-23 ENCOUNTER — Encounter: Payer: Self-pay | Admitting: Family Medicine

## 2019-11-24 ENCOUNTER — Encounter: Payer: Medicare PPO | Admitting: Surgery

## 2019-11-25 ENCOUNTER — Encounter: Payer: Self-pay | Admitting: Family Medicine

## 2019-11-29 ENCOUNTER — Telehealth: Payer: Self-pay

## 2019-11-29 DIAGNOSIS — I351 Nonrheumatic aortic (valve) insufficiency: Secondary | ICD-10-CM

## 2019-11-29 NOTE — Telephone Encounter (Signed)
Orders placed for echo

## 2019-11-29 NOTE — Telephone Encounter (Signed)
-----   Message from Sueanne Margarita, MD sent at 11/24/2019  6:38 PM EDT ----- Reviewed findings of TEE with patient.  He has moderate to severe eccentric AI but is asymptomatic and LVF has actually improved and LV dimensions have decreased with medical therapy of DCM.  Please repeat 2D echo in 6 months for AI.  He has an upcoming appt with Dr. Cyndia Bent to review AI and aortic aneurysm

## 2019-12-01 ENCOUNTER — Telehealth: Payer: Self-pay | Admitting: Physician Assistant

## 2019-12-01 NOTE — Telephone Encounter (Signed)
Received notification from [facility] for diagnosis code for CBC done 11/12/19. I did not order test but result was routed to me. Appears Dr. Radford Pax ordered it in prep for TEE. Will route to clinic admin to fix billing of lab.  Jadalee Westcott PA-C

## 2019-12-02 ENCOUNTER — Ambulatory Visit (INDEPENDENT_AMBULATORY_CARE_PROVIDER_SITE_OTHER): Payer: Medicare PPO | Admitting: *Deleted

## 2019-12-02 DIAGNOSIS — I420 Dilated cardiomyopathy: Secondary | ICD-10-CM | POA: Diagnosis not present

## 2019-12-02 LAB — CUP PACEART REMOTE DEVICE CHECK
Battery Remaining Longevity: 86 mo
Battery Remaining Percentage: 92 %
Battery Voltage: 2.98 V
Brady Statistic AP VP Percent: 30 %
Brady Statistic AP VS Percent: 1 %
Brady Statistic AS VP Percent: 67 %
Brady Statistic AS VS Percent: 1 %
Brady Statistic RA Percent Paced: 27 %
Date Time Interrogation Session: 20210520131540
HighPow Impedance: 71 Ohm
Implantable Lead Implant Date: 20201117
Implantable Lead Implant Date: 20201117
Implantable Lead Implant Date: 20201117
Implantable Lead Location: 753858
Implantable Lead Location: 753859
Implantable Lead Location: 753860
Implantable Pulse Generator Implant Date: 20201117
Lead Channel Impedance Value: 410 Ohm
Lead Channel Impedance Value: 430 Ohm
Lead Channel Impedance Value: 750 Ohm
Lead Channel Pacing Threshold Amplitude: 0.5 V
Lead Channel Pacing Threshold Amplitude: 0.75 V
Lead Channel Pacing Threshold Amplitude: 0.875 V
Lead Channel Pacing Threshold Pulse Width: 0.5 ms
Lead Channel Pacing Threshold Pulse Width: 0.5 ms
Lead Channel Pacing Threshold Pulse Width: 0.5 ms
Lead Channel Sensing Intrinsic Amplitude: 11.8 mV
Lead Channel Sensing Intrinsic Amplitude: 2.1 mV
Lead Channel Setting Pacing Amplitude: 1.375
Lead Channel Setting Pacing Amplitude: 1.5 V
Lead Channel Setting Pacing Amplitude: 2 V
Lead Channel Setting Pacing Pulse Width: 0.5 ms
Lead Channel Setting Pacing Pulse Width: 0.5 ms
Lead Channel Setting Sensing Sensitivity: 0.5 mV
Pulse Gen Serial Number: 111012743

## 2019-12-06 ENCOUNTER — Ambulatory Visit (INDEPENDENT_AMBULATORY_CARE_PROVIDER_SITE_OTHER): Payer: Medicare PPO

## 2019-12-06 DIAGNOSIS — Z95810 Presence of automatic (implantable) cardiac defibrillator: Secondary | ICD-10-CM | POA: Diagnosis not present

## 2019-12-06 DIAGNOSIS — I5022 Chronic systolic (congestive) heart failure: Secondary | ICD-10-CM | POA: Diagnosis not present

## 2019-12-06 NOTE — Progress Notes (Signed)
Remote ICD transmission.   

## 2019-12-08 ENCOUNTER — Telehealth: Payer: Self-pay

## 2019-12-08 ENCOUNTER — Encounter: Payer: Self-pay | Admitting: Surgery

## 2019-12-08 ENCOUNTER — Institutional Professional Consult (permissible substitution): Payer: Medicare PPO | Admitting: Surgery

## 2019-12-08 ENCOUNTER — Other Ambulatory Visit: Payer: Self-pay

## 2019-12-08 VITALS — BP 160/67 | HR 63 | Temp 97.7°F | Resp 20 | Ht 65.0 in | Wt 174.0 lb

## 2019-12-08 DIAGNOSIS — I7121 Aneurysm of the ascending aorta, without rupture: Secondary | ICD-10-CM

## 2019-12-08 DIAGNOSIS — I712 Thoracic aortic aneurysm, without rupture: Secondary | ICD-10-CM | POA: Diagnosis not present

## 2019-12-08 NOTE — Progress Notes (Signed)
EPIC Encounter for ICM Monitoring  Patient Name: Anthony Daugherty is a 74 y.o. male Date: 12/08/2019 Primary Care Physican: Tammi Sou, MD Primary Cardiologist:Turner Electrophysiologist:Allred Bi-V Pacing:97% 10/26/2019 Weight:175lbs    Attempted call to wife and unable to reach.  Left detailed message per DPR regarding transmission. Transmission reviewed.   CorVue thoracic impedance normal.  Labs: 11/12/2019 Creatinine 0.92, BUN 14, Potassium 4.2, Sodium 139, GFR 81-94 09/21/2019 Creatinine 0.92, BUN 15, Potassium 4.1, Sodium 136, GFR 80.63  Recommendations: No changes and encouraged to call if experiencing any fluid symptoms.  Follow-up plan: ICM clinic phone appointment on6/28/2021. 91 day device clinic remote transmission 03/02/2020.   Copy of ICM check sent to Dr.Allred.  3 month ICM trend: 12/06/2019    1 Year ICM trend:       Rosalene Billings, RN 12/08/2019 12:38 PM

## 2019-12-08 NOTE — Telephone Encounter (Signed)
Remote ICM transmission received.  Attempted call to wife regarding ICM remote transmission and left detailed message per DPR.  Advised to return call for any fluid symptoms or questions. Next ICM remote transmission scheduled 01/10/2020.

## 2019-12-10 NOTE — Progress Notes (Signed)
Patient ID: ARIZ LIFSEY, male   DOB: 02/08/1946, 74 y.o.   MRN: [id number]  [facility]  MULTIDISCIPLINARY HEART VALVE CLINIC  CARDIOTHORACIC SURGERY CONSULTATION REPORT  Referring Provider is Sueanne Margarita, MD Primary Cardiologist is Fransico Him, MD PCP is [name], [name], MD  Chief Complaint  Patient presents with  . Thoracic Aortic Aneurysm, moderate to severe AI and multivessel coronary artery disease    Surgical eval, CTA Chest 11/17/19, ECHO 11/29/19    HPI:  The patient is a 74 year old gentleman with a history of hypertension, multivessel coronary disease status post failed attempt at atherectomy and PCI of a high-grade RCA stenosis in June 2020, cardiomyopathy with combined chronic systolic and diastolic congestive heart failure and left bundle branch block morphology status post BiV ICD implant in 05/2019 by Dr. Rayann Heman, progressive aortic insufficiency and ascending aortic aneurysm.  He had an echocardiogram in 08/2017 that showed an ejection fraction of 40 to 45% with mild to moderate aortic insufficiency and mild aortic stenosis with a mean gradient of 15 mmHg.  The aortic root diameter was measured at 43 mm and the ascending aorta was 40 mm at that time.  The aortic valve was trileaflet, mildly thickened, and mildly calcified with restricted mobility.  A follow-up echocardiogram in February 2020 showed a significant drop in his left ventricular ejection fraction to 20 to 25% with moderate AI and the mean gradient across aortic valve measured at 9 mmHg.  His medications were adjusted with changing of his beta-blocker to carvedilol and he was continued on an ARB.  He underwent cardiac catheterization in June 2020 showing a 95% calcified stenosis of the proximal RCA with 75% first marginal stenosis and 50% proximal and mid LAD stenosis.  He underwent an attempt at atherectomy and PCI of the RCA but it was not possible to cross the lesion and medical therapy was  recommended.  It was felt that his cardiomyopathy was mixed ischemic and nonischemic with possible EtOH contribution.  His medications were titrated with addition of Entresto.  He had a follow-up echocardiogram and October 2020 that showed an ejection fraction of 25 to 30% with moderate aortic insufficiency.  Mean gradient across aortic valve was measured at 11.6 mmHg.  He was referred to EP and underwent insertion of a BiV ICD on 06/01/2019.  He has not had any ICD discharges.  A follow-up echocardiogram in April 2021 showed an improvement in his ejection fraction to 50%.  The LV internal diameter during diastole was 5.9 cm with a diameter during systole of 4.4 cm which was a significant improvement from his prior echo when the diameter during diastole was 6.4 cm and during systole 5.4 cm.  This recent echo showed moderate to severe AI with reversal of flow seen in the descending thoracic aorta.  It was a very eccentric jet of AI directed towards the anterior leaf of the mitral valve with restricted diastolic excursion of the anterior mitral leaflet and early closure due to the AI jet.  The aortic root diameter was measured at 4.2 cm with an ascending aortic diameter 3.8 cm.  A TEE was performed on 11/15/2019 which showed an ejection fraction of 50 to 55%.  The aortic valve was trileaflet with very eccentric jet of AI that started more centrally and became eccentric.  There was no definite holodiastolic backflow from the descending aorta.  This was felt to be moderate to severe AI.  The patient is here today with his wife.  He tells me that he is completely asymptomatic.  Past Medical History:  Diagnosis Date  . Aortic regurgitation    Mild-mod on echo 07/2017.  Moderate 08/2018.  Moderate-severe on echo 04/2019--> TEE 11/15/19 EF 50-55%, no LV dilation or WMA. Mod AV sclerosis w/out stenosis, mod-to-sev AI-.>rpt echo 6 mo  . Aortic root dilatation ([place]) 11/17/2019   11/17/19 45 mm aortic root at the sinuses of  Valsalva->Dr. Radford Pax referred pt to CT surgeon.  . Aortic stenosis, mild   . Arthritis    L-Spine  . CARDIAC MURMUR [id number]; [id number]   Systolic and diastolic as of 123456.  . Cardiomyopathy ([place]) 10/12/2010   Global hypokinesis, EF 40-45% by cath and echo (2012 echo and 2019 echo).  Feb 2020 EF 20-25%. - mixed ICM/NICM (alcohol and CAD)  October 2020 EF 25-30%.  . Chronic combined systolic and diastolic CHF (congestive heart failure) (HCC)    max med mgmt, plus CRT-D placed 05/2019, EF improved to 50% after.  . Coronary artery disease 11/16/2010   a. 2012 - 80% OM lesion. b. Cath 12/2018 - showing 95% prox RCA, 75% OM1, 50% prox-mid LAD. Unsuccessful PCI to 100% chronically occluded RCA summer 2020.  . Dilated aortic root ([place]) 10/21/2018   53mm at sinuses of Valsalva  . Diverticulosis   . Erectile dysfunction 08/16/2011  . ESSENTIAL HYPERTENSION 07/19/2010   Qualifier: Diagnosis of  By: Anitra Lauth M.D., Brien Few   . GAD (generalized anxiety disorder)    managed by Dr. Toy Care with prozac 20mg  qd  . Gout    Usually Right great toe; colchicine helps well  . Habitual alcohol use   . Hx of colonoscopy 2004; 2014   Initial was normal; 2014-polypectomy and diverticulosis.  Recall 2019.  Marland Kitchen Hyperlipidemia 10/23/2012  . LBBB (left bundle branch block)    CRT-D placed 05/2019  . Low back strain 10/24/2013  . Obesity, Class I, BMI 30-34.9   . Thyroid nodule 04/23/2013  . TOBACCO ABUSE 08/13/2010    Past Surgical History:  Procedure Laterality Date  . BIV ICD INSERTION CRT-D N/A 06/01/2019   Procedure: BIV ICD INSERTION CRT-D;  Surgeon: [name], MD;  Location: [facility];  Service: Cardiovascular;  Laterality: N/A;  . CARDIAC CATHETERIZATION  10/12/10   diagnostic only: 1 vessel CAD with TIMI 3 flow, EF 40-45%.  Mild AS.  Medical mgmt of CAD.  Marland Kitchen COLONOSCOPY    . COLONOSCOPY W/ POLYPECTOMY  10/06/12   Diverticulosis and tubular adenoma w/out high grade dysplasia.  Repeat 5 yrs (Dr.  Deatra Ina).  . CORONARY ATHERECTOMY N/A 01/08/2019   100% chronic RCA occlusion impossible to clear.  Med mgmt. Procedure: CORONARY ATHERECTOMY;  Surgeon: Sherren Mocha, MD;  Location: [facility];  Service: Cardiovascular;  Laterality: N/A;  . POLYPECTOMY    . RIGHT HEART CATH AND CORONARY ANGIOGRAPHY N/A 12/24/2018   95% RCA occlusion, 75% obtuse marginal occlusion (3V dz). Procedure: RIGHT HEART CATH AND CORONARY ANGIOGRAPHY;  Surgeon: Lorretta Harp, MD;  Location: [facility];  Service: Cardiovascular;  Laterality: N/A;  . TEE WITHOUT CARDIOVERSION N/A 11/15/2019   EF 50-55%, no LV dilation or WMA. Mod AV sclerosis w/out stenosis, mod-to-sev AI.  Procedure: TRANSESOPHAGEAL ECHOCARDIOGRAM (TEE);  Surgeon: Fay Records, MD;  Location: [facility];  Service: Cardiovascular;  Laterality: N/A;  . TRANSTHORACIC ECHOCARDIOGRAM  09/2010; 07/2017; 08/2018; 10/2019   2012:  EF 40-45%, global hypokinesis, mild AS.  07/2017: no interval chg except mild/mod AR.  08/2018  EF 20-25%, global LV hypok, DD, biatrial enlargemt, mod AR, aortic root 3.8 cm. 10/2019 EF 50%, mild dec RV fxn, mod-sev AR, aor root dil 42 mm-->TEE to be done    Family History  Problem Relation Age of Onset  . Congenital heart disease Brother        cardiomyopathy  . Alzheimer's disease Mother   . COPD Father   . Heart failure Father        congestive  . Colon cancer Neg Hx   . Esophageal cancer Neg Hx   . Stomach cancer Neg Hx   . Rectal cancer Neg Hx   . Colon polyps Neg Hx     Social History   Socioeconomic History  . Marital status: Married    Spouse name: Thayer Headings  . Number of children: 2  . Years of education: Not on file  . Highest education level: Not on file  Occupational History  . Occupation: Engineer, structural: [employer]  Tobacco Use  . Smoking status: Former Research scientist (life sciences)  . Smokeless tobacco: Current User    Types: Chew  . Tobacco comment: chews tobacco  Substance and Sexual Activity   . Alcohol use: Yes    Alcohol/week: 28.0 standard drinks    Types: 28 Cans of beer per week  . Drug use: No  . Sexual activity: Yes  Other Topics Concern  . Not on file  Social History Narrative   Delivery driver.  Married, two children.   Long hx of smoking but quit after 2010.      Lives in [place].   Retired delivery person for a [employer]   Social Determinants of Radio broadcast assistant Strain:   . Difficulty of Paying Living Expenses:   Food Insecurity:   . Worried About Charity fundraiser in the Last Year:   . Arboriculturist in the Last Year:   Transportation Needs:   . Film/video editor (Medical):   Marland Kitchen Lack of Transportation (Non-Medical):   Physical Activity:   . Days of Exercise per Week:   . Minutes of Exercise per Session:   Stress:   . Feeling of Stress :   Social Connections:   . Frequency of Communication with Friends and Family:   . Frequency of Social Gatherings with Friends and Family:   . Attends Religious Services:   . Active Member of Clubs or Organizations:   . Attends Archivist Meetings:   Marland Kitchen Marital Status:   Intimate Partner Violence:   . Fear of Current or Ex-Partner:   . Emotionally Abused:   Marland Kitchen Physically Abused:   . Sexually Abused:     Current Outpatient Medications  Medication Sig Dispense Refill  . acetaminophen (TYLENOL) 500 MG tablet Take 500-1,000 mg by mouth every 6 (six) hours as needed (for pain.).    Marland Kitchen atorvastatin (LIPITOR) 40 MG tablet Take 40 mg by mouth daily.    . carvedilol (COREG) 25 MG tablet Take 1 tablet (25 mg total) by mouth 2 (two) times daily. 180 tablet 3  . colchicine 0.6 MG tablet Take 0.6 mg by mouth as needed (gout).    Marland Kitchen ENTRESTO 49-51 MG TAKE  (1)  TABLET TWICE A DAY. 60 tablet 10  . FLUoxetine (PROZAC) 20 MG capsule Take 20 mg by mouth daily.      No current facility-administered medications for this visit.    Allergies  Allergen Reactions  . Penicillins Swelling  Did it  involve swelling of the face/tongue/throat, SOB, or low BP? No Did it involve sudden or severe rash/hives, skin peeling, or any reaction on the inside of your mouth or nose? No Did you need to seek medical attention at a hospital or doctor's office? No When did it last happen? ~10 years ago If all above answers are "NO", may proceed with cephalosporin use.       Review of Systems:   General:  normal appetite, normal energy, no weight gain, no weight loss, no fever  Cardiac:  no chest pain with exertion, no chest pain at rest, noSOB with  exertion, no resting SOB, no PND, no orthopnea, no palpitations, no arrhythmia, no atrial fibrillation, no LE edema, no dizzy spells, no syncope  Respiratory:  no shortness of breath, no home oxygen, no productive cough, no dry cough, no bronchitis, no wheezing, no hemoptysis, no asthma, no pain with inspiration or cough, no sleep apnea, no CPAP at night  GI:   no difficulty swallowing, no reflux, no frequent heartburn, no hiatal hernia, no abdominal pain, no constipation, no diarrhea, no hematochezia, no hematemesis, no melena  GU:   no dysuria,  no frequency, no urinary tract infection, no hematuria, no enlarged prostate, no kidney stones, no kidney disease  Vascular:  no pain suggestive of claudication, no pain in feet, no leg cramps, no varicose veins, no DVT, no non-healing foot ulcer  Neuro:   no stroke, no TIA's, no seizures, no headaches, no temporary blindness one eye,  no slurred speech, no peripheral neuropathy, no chronic pain, no instability of gait, no memory/cognitive dysfunction  Musculoskeletal: no arthritis, no joint swelling, no myalgias, no difficulty walking, normal mobility   Skin:   no rash, no itching, no skin infections, no pressure sores or ulcerations  Psych:   no anxiety, no depression, no nervousness, no unusual recent stress  Eyes:   no blurry vision, no floaters, no recent vision changes, + wears glasses or contacts  ENT:   no  hearing loss, no loose or painful teeth, no dentures Hematologic:  no easy bruising, no abnormal bleeding, no clotting disorder, no frequent epistaxis  Endocrine:  no diabetes, does not check CBG's at home      Physical Exam:   BP (!) 160/67   Pulse 63   Temp 97.7 F (36.5 C) (Skin)   Resp 20   Ht 5\' 5"  (1.651 m)   Wt 174 lb (78.9 kg)   SpO2 98% Comment: RA  BMI 28.96 kg/m   General:  well-appearing  HEENT:  Unremarkable, NCAT, PERLA, EOMI  Neck:   no JVD, no bruits, no adenopathy   Chest:   clear to auscultation, symmetrical breath sounds, no wheezes, no rhonchi   CV:   RRR, grade ll/VI crescendo/decrescendo murmur heard best at RSB, lll/Vl diastolic murmur LSB  Abdomen:  soft, non-tender, no masses   Extremities:  warm, well-perfused, pulses palpable in feet, no LE edema  Rectal/GU  Deferred  Neuro:   Grossly non-focal and symmetrical throughout  Skin:   Clean and dry, no rashes, no breakdown   Diagnostic Tests:  ECHOCARDIOGRAM REPORT       Patient Name:  Joellyn Haff Date of Exam: 10/26/2019  Medical Rec #: [id number]      Height:    65.0 in  Accession #:  [id number]     Weight:    176.8 lb  Date of Birth: [birth year]      BSA:  1.877 m  Patient Age:  54 years      BP:      118/57 mmHg  Patient Gender: M          HR:      75 bpm.  Exam Location: [address]   Procedure: 2D Echo, Cardiac Doppler and Color Doppler   Indications:  I35.1 Aortic Insufficiency    History:    Patient has prior history of Echocardiogram examinations,  most         recent 04/26/2019. Risk Factors:Hypertension. LBBB.  Dilated         aortic root. Murmur. Ischemic cardiomyopathy. Chronic  combined         systolic and diastolic heart failure.    Sonographer:  Wilford Sports Rodgers-Jones RDCS  Referring Phys: [place]    1. Left ventricular ejection  fraction, by estimation, is 50%. The left  ventricle has low normal function. The left ventricle has no regional wall  motion abnormalities. The left ventricular internal cavity size was mildly  dilated. There is mild  asymmetric left ventricular hypertrophy of the septal segment. Left  ventricular diastolic parameters are indeterminate.  2. Right ventricular systolic function is mildly reduced. The right  ventricular size is moderately enlarged. Tricuspid regurgitation signal is  inadequate for assessing PA pressure.  3. The mitral valve is normal in structure. Trivial mitral valve  regurgitation. No evidence of mitral stenosis.  4. Very eccentric jet of aortic valve regurgitation, directed toward  anterior mitral leaflet, with restricted diastolic excursion of anterior  mitral leaflet and early closure due to AR jet. Aortic regurgitation is at  least moderate-severe. LVEF ~50%,  LVESD 44 mm. Reversal TVI in the descending thoracic aorta appears  significant (14 cm). Unable to measure vena contracta or PHT accurately  due to eccentric jet.. The aortic valve is abnormal. Aortic valve  regurgitation is moderate to severe. Mild aortic  valve stenosis. Aortic valve mean gradient measures 10.0 mmHg.  5. Aortic dilatation noted. There is mild dilatation of the aortic root  measuring 42 mm.  6. The inferior vena cava is normal in size with greater than 50%  respiratory variability, suggesting right atrial pressure of 3 mmHg.   Comparison(s): A prior study was performed on 04/26/2019. Prior images  reviewed side by side. LVEF has improved. Aortic valve regurgitation is  better visualized, and may have increased.   Conclusion(s)/Recommendation(s): Consider TEE for further evaluation of  aortic valve regurgitation.   FINDINGS  Left Ventricle: Left ventricular ejection fraction, by estimation, is  50%. The left ventricle has low normal function. The left ventricle has no  regional wall  motion abnormalities. The left ventricular internal cavity  size was mildly dilated. There is  mild asymmetric left ventricular hypertrophy of the septal segment.  Abnormal (paradoxical) septal motion, consistent with RV pacemaker. Left  ventricular diastolic parameters are indeterminate.   Right Ventricle: The right ventricular size is moderately enlarged. No  increase in right ventricular wall thickness. Right ventricular systolic  function is mildly reduced. Tricuspid regurgitation signal is inadequate  for assessing PA pressure.   Left Atrium: Left atrial size was normal in size.   Right Atrium: Right atrial size was normal in size.   Pericardium: There is no evidence of pericardial effusion.   Mitral Valve: The mitral valve is normal in structure. Normal mobility of  the mitral valve leaflets. Trivial mitral valve regurgitation. No evidence  of mitral valve stenosis.   Tricuspid  Valve: The tricuspid valve is normal in structure. Tricuspid  valve regurgitation is not demonstrated. No evidence of tricuspid  stenosis.   Aortic Valve: Very eccentric jet of aortic valve regurgitation, directed  toward anterior mitral leaflet, with restricted diastolic excursion of  anterior mitral leaflet and early closure due to AR jet. Aortic  regurgitation is at least moderate-severe.  LVEF ~50%, LVESD 44 mm. Reversal TVI in the descending thoracic aorta  appears significant (14 cm). Unable to measure vena contracta or PHT  accurately due to eccentric jet. The aortic valve is abnormal. Aortic  valve regurgitation is moderate to severe.  Mild aortic stenosis is present. There is moderate calcification of the  aortic valve. Aortic valve mean gradient measures 10.0 mmHg. Aortic valve  peak gradient measures 15.6 mmHg. Aortic valve area, by VTI measures 1.94  cm.   Pulmonic Valve: The pulmonic valve was normal in structure. Pulmonic valve  regurgitation is trivial. No evidence of pulmonic  stenosis.   Aorta: Aortic dilatation noted. There is mild dilatation of the aortic  root measuring 42 mm.   Venous: The inferior vena cava is normal in size with greater than 50%  respiratory variability, suggesting right atrial pressure of 3 mmHg.   IAS/Shunts: No atrial level shunt detected by color flow Doppler.   Additional Comments: A pacer wire is visualized in the right ventricle.     LEFT VENTRICLE  PLAX 2D  LVIDd:     5.90 cm Diastology  LVIDs:     4.40 cm LV e' lateral:  7.40 cm/s  LV PW:     1.00 cm LV E/e' lateral: 5.7  LV IVS:    1.10 cm LV e' medial:  5.00 cm/s  LVOT diam:   2.50 cm LV E/e' medial: 8.4  LV SV:     73  LV SV Index:  39  LVOT Area:   4.91 cm     RIGHT VENTRICLE  RV Basal diam: 5.40 cm  RV S prime:   11.55 cm/s  TAPSE (M-mode): 2.3 cm   LEFT ATRIUM       Index    RIGHT ATRIUM      Index  LA diam:    4.80 cm 2.56 cm/m RA Area:   19.90 cm  LA Vol (A2C):  63.1 ml 33.61 ml/m RA Volume:  63.00 ml 33.56 ml/m  LA Vol (A4C):  44.2 ml 23.55 ml/m  LA Biplane Vol: 55.4 ml 29.51 ml/m  AORTIC VALVE  AV Area (Vmax):  1.66 cm  AV Area (Vmean):  1.69 cm  AV Area (VTI):   1.94 cm  AV Vmax:      197.33 cm/s  AV Vmean:     151.000 cm/s  AV VTI:      0.375 m  AV Peak Grad:   15.6 mmHg  AV Mean Grad:   10.0 mmHg  LVOT Vmax:     66.90 cm/s  LVOT Vmean:    51.900 cm/s  LVOT VTI:     0.148 m  LVOT/AV VTI ratio: 0.39    AORTA  Ao Root diam: 4.20 cm  Ao Asc diam: 3.80 cm   MITRAL VALVE  MV Area (PHT): 2.45 cm  SHUNTS  MV Decel Time: 310 msec  Systemic VTI: 0.15 m  MV E velocity: 42.20 cm/s Systemic Diam: 2.50 cm  MV A velocity: 88.80 cm/s  MV E/A ratio: 0.48   Cherlynn Kaiser MD  Electronically signed by Cherlynn Kaiser MD  Signature Date/Time: 10/27/2019/6:25:59 AM  TRANSESOPHOGEAL ECHO REPORT       Patient Name:   WESTER TERREL Date of Exam: 11/15/2019  Medical Rec #: [id number]      Height:    65.0 in  Accession #:  [id number]     Weight:    176.8 lb  Date of Birth: 1946/06/25      BSA:     1.877 m  Patient Age:  41 years      BP:      95/62 mmHg  Patient Gender: M          HR:      53 bpm.  Exam Location: Inpatient   Procedure: Transesophageal Echo, Color Doppler, Cardiac Doppler and 3D  Echo   Indications:   I35.1 Nonrheumatic aortic (valve) insufficiency    History:     Patient has prior history of Echocardiogram examinations,  most          recent 10/27/2019. CAD, Defibrillator; Risk  Factors:Current          Smoker and Dyslipidemia.    Sonographer:   Mikki Santee RDCS (AE)  Referring Phys: [id number] TRACI R TURNER  Diagnosing Phys: Dorris Carnes MD   PROCEDURE: After discussion of the risks and benefits of a TEE, an  informed consent was obtained from the patient. The transesophogeal probe  was passed without difficulty through the esophogus of the patient.  Sedation performed by different physician.  The patient was monitored while under deep sedation. Anesthestetic  sedation was provided intravenously by Anesthesiology: 368.95mg  of  Propofol, 40mg  of Lidocaine. The patient developed no complications during  the procedure.   IMPRESSIONS    1. Left ventricular ejection fraction, by estimation, is 50 to 55%. The  left ventricle has low normal function. The left ventricle has no regional  wall motion abnormalities.  2. Right ventricular systolic function is normal. The right ventricular  size is normal.  3. No left atrial/left atrial appendage thrombus was detected.  4. The mitral valve is normal in structure. Trivial mitral valve  regurgitation.  5. AV is trileaflet. There is mild/moderate sclerosis. The is a very  eccentric jet of aortic insufficiency that appears to start more central    and becomes eccentric, tracking between the left and noncoronary cusps to  commisure. Even with 3D diffciult to  obtain vena contracta. There is no definite holodiastolic backflow from  descending aorta during study Grade 3/4. Type III aortic insufficiency.   FINDINGS  Left Ventricle: Left ventricular ejection fraction, by estimation, is 50  to 55%. The left ventricle has low normal function. The left ventricle has  no regional wall motion abnormalities. The left ventricular internal  cavity size was normal in size.  There is no left ventricular hypertrophy.   Right Ventricle: The right ventricular size is normal. Right vetricular  wall thickness was not assessed. Right ventricular systolic function is  normal.   Left Atrium: Left atrial size was normal in size. No left atrial/left  atrial appendage thrombus was detected.   Right Atrium: Right atrial size was normal in size.   Pericardium: There is no evidence of pericardial effusion.   Mitral Valve: The mitral valve is normal in structure. Trivial mitral  valve regurgitation.   Tricuspid Valve: The tricuspid valve is normal in structure. Tricuspid  valve regurgitation is trivial.   Aortic Valve: AV is trileaflet. There is mild/moderate sclerosis. The is a  very eccentric jet of aortic insufficiency that appears to start more  central and  becomes eccentric, tracking between left and noncoronary  cusps. Even with 3D diffciult to obtain  vena contracta. There is no definite holodiastolic backflow from  descending aorta during study. Type III aortic insufficiency. Aortic valve  regurgitation is moderate to severe.  Pulmonic Valve: The pulmonic valve was normal in structure. Pulmonic valve  regurgitation is not visualized.   Aorta: The aortic root and ascending aorta are structurally normal, with  no evidence of dilitation.   IAS/Shunts: No atrial level shunt detected by color flow Doppler.     LEFT VENTRICLE  PLAX 2D   LVOT diam:   2.70 cm  LVOT Area:   5.73 cm       SHUNTS  Systemic Diam: 2.70 cm   Dorris Carnes MD  Electronically signed by Dorris Carnes MD  Signature Date/Time: 11/15/2019/9:58:37 PM     Impression:  This 74 year old gentleman has stage C, asymptomatic moderate to severe aortic insufficiency as well as a mildly dilated aortic root measured at 4.5 cm with a sinotubular junction of 3.1 cm and an ascending aorta of 3.6 cm.  He also has significant multivessel coronary artery disease noted on catheterization in June 2020 that has been treated medically since the patient was asymptomatic and had failed PCI of the high-grade RCA stenosis.  I have personally reviewed his cardiac catheterization, echocardiograms, and CTA study.  His cardiac catheterization showed diffusely calcified coronary arteries with a high-grade calcified stenosis in the proximal right coronary artery that could not be successfully opened with atherectomy.  I think the obtuse marginal stenosis and LAD stenosis are significant but not high-grade and he continues to deny any anginal symptoms.  His decrease in ejection fraction noted previously certainly could have been partly ischemic in nature and I think he will be at continued risk of further myocardial loss in the future.  His 2D echo and TEE show a trileaflet aortic valve with some thickening and failure of leaflet coaptation with eccentric AI that is at least moderate if not severe.  His ejection fraction has improved significantly with medical therapy and he remains asymptomatic but with that degree of aortic insufficiency I would expect his left ventricle to eventually suffer further decline in function and dilatation without treatment.  His echocardiogram and CTA showed mild dilatation of his aortic root which is well below the surgical threshold of 5.5 cm and may or may not be contributing to his aortic insufficiency although I doubt that it is.  Since he is completely  asymptomatic and his left ventricular function and internal dimensions significantly improved with medical treatment I think it is reasonable to continue close observation with echocardiograms at 20-month intervals.  If there is any change in his left ventricular function or internal dimensions then he should proceed with surgery.  If I was going to operate on him at this time he would require coronary bypass grafting and aortic valve replacement and I am not sure that I would replace his aortic root at this size given his age and the complexity of doing that in addition to coronary bypass graft surgery.  He has very calcified left main and right coronary arteries proximally which would make reanastomosis during root replacement hazardous.  I reviewed the symptoms of progressive aortic insufficiency and left ventricular deterioration as well as myocardial ischemia with him and his wife and told him that he should be reevaluated immediately if he develops any of those.  I reviewed all of his studies with him and his wife and  answered their questions.   Plan:  He will continue to follow-up with Dr. Radford Pax for periodic echocardiograms.  I will be happy to see him back to discuss surgery if he develops any change in his ventricular function or internal dimensions or symptoms related to his aortic insufficiency or coronary artery disease.  I spent 80 minutes performing this consultation and > 50% of this time was spent face to face counseling and coordinating the care of this patient's multivessel coronary disease, moderate to severe aortic insufficiency, and aortic root aneurysm.    Gaye Pollack, MD 12/08/2019

## 2019-12-20 ENCOUNTER — Other Ambulatory Visit: Payer: Self-pay | Admitting: Cardiology

## 2020-01-10 ENCOUNTER — Ambulatory Visit (INDEPENDENT_AMBULATORY_CARE_PROVIDER_SITE_OTHER): Payer: Medicare PPO

## 2020-01-10 DIAGNOSIS — I5022 Chronic systolic (congestive) heart failure: Secondary | ICD-10-CM

## 2020-01-10 DIAGNOSIS — Z95810 Presence of automatic (implantable) cardiac defibrillator: Secondary | ICD-10-CM

## 2020-01-12 ENCOUNTER — Telehealth: Payer: Self-pay

## 2020-01-12 NOTE — Telephone Encounter (Signed)
Remote ICM transmission received.  Attempted call to wife regarding ICM remote transmission and left detailed message per DPR.  Advised to return call for any fluid symptoms or questions. Next ICM remote transmission scheduled 02/14/2020.

## 2020-01-12 NOTE — Progress Notes (Signed)
EPIC Encounter for ICM Monitoring  Patient Name: Anthony Daugherty is a 74 y.o. male Date: 01/12/2020 Primary Care Physican: Tammi Sou, MD Primary Cardiologist:Turner Electrophysiologist:Allred Bi-V Pacing:98% 10/26/2019 Weight:175lbs    Attempted call to wife and unable to reach.  Left detailed message per DPR regarding transmission. Transmission reviewed.   CorVue thoracic impedance normal.  Labs: 11/12/2019 Creatinine 0.92, BUN 14, Potassium 4.2, Sodium 139, GFR 81-94 09/21/2019 Creatinine 0.92, BUN 15, Potassium 4.1, Sodium 136, GFR 80.63  Recommendations:Left voice mail with ICM number and encouraged to call if experiencing any fluid symptoms.  Follow-up plan: ICM clinic phone appointment on8/08/2019. 91 day device clinic remote transmission 03/02/2020.  EP/Cardiology Office Visits: Recalls 05/03/2020 with Dr. Radford Pax and 05/15/2020 with Dr Rayann Heman.    Copy of ICM check sent to Dr. Rayann Heman.   3 month ICM trend: 01/10/2020    1 Year ICM trend:       Rosalene Billings, RN 01/12/2020 10:07 AM

## 2020-02-07 NOTE — Telephone Encounter (Signed)
Left message for patient to call back  

## 2020-02-16 NOTE — Telephone Encounter (Signed)
Spoke with the patient's wife and advised her to contact billing department in regards to any paperwork that needed to be filled out in regards to claims for procedures. She states that she has tried to do so. Also advised that she could fill forms out and either mail or drop them off.

## 2020-02-22 NOTE — Progress Notes (Signed)
No ICM remote transmission received for 02/14/2020 and next ICM transmission scheduled for 03/03/2020.

## 2020-03-02 ENCOUNTER — Ambulatory Visit (INDEPENDENT_AMBULATORY_CARE_PROVIDER_SITE_OTHER): Payer: Medicare PPO | Admitting: *Deleted

## 2020-03-02 DIAGNOSIS — I420 Dilated cardiomyopathy: Secondary | ICD-10-CM | POA: Diagnosis not present

## 2020-03-03 ENCOUNTER — Ambulatory Visit (INDEPENDENT_AMBULATORY_CARE_PROVIDER_SITE_OTHER): Payer: Medicare PPO

## 2020-03-03 DIAGNOSIS — Z95810 Presence of automatic (implantable) cardiac defibrillator: Secondary | ICD-10-CM

## 2020-03-03 DIAGNOSIS — I5022 Chronic systolic (congestive) heart failure: Secondary | ICD-10-CM | POA: Diagnosis not present

## 2020-03-03 LAB — CUP PACEART REMOTE DEVICE CHECK
Battery Remaining Longevity: 82 mo
Battery Remaining Percentage: 89 %
Battery Voltage: 2.98 V
Brady Statistic AP VP Percent: 22 %
Brady Statistic AP VS Percent: 1 %
Brady Statistic AS VP Percent: 76 %
Brady Statistic AS VS Percent: 1 %
Brady Statistic RA Percent Paced: 20 %
Date Time Interrogation Session: 20210819020138
HighPow Impedance: 64 Ohm
Implantable Lead Implant Date: 20201117
Implantable Lead Implant Date: 20201117
Implantable Lead Implant Date: 20201117
Implantable Lead Location: 753858
Implantable Lead Location: 753859
Implantable Lead Location: 753860
Implantable Pulse Generator Implant Date: 20201117
Lead Channel Impedance Value: 390 Ohm
Lead Channel Impedance Value: 410 Ohm
Lead Channel Impedance Value: 780 Ohm
Lead Channel Pacing Threshold Amplitude: 0.625 V
Lead Channel Pacing Threshold Amplitude: 0.75 V
Lead Channel Pacing Threshold Amplitude: 0.875 V
Lead Channel Pacing Threshold Pulse Width: 0.5 ms
Lead Channel Pacing Threshold Pulse Width: 0.5 ms
Lead Channel Pacing Threshold Pulse Width: 0.5 ms
Lead Channel Sensing Intrinsic Amplitude: 1.6 mV
Lead Channel Sensing Intrinsic Amplitude: 11.8 mV
Lead Channel Setting Pacing Amplitude: 1.375
Lead Channel Setting Pacing Amplitude: 1.625
Lead Channel Setting Pacing Amplitude: 2 V
Lead Channel Setting Pacing Pulse Width: 0.5 ms
Lead Channel Setting Pacing Pulse Width: 0.5 ms
Lead Channel Setting Sensing Sensitivity: 0.5 mV
Pulse Gen Serial Number: 111012743

## 2020-03-06 NOTE — Telephone Encounter (Signed)
Form mailed to patient's home with physician section completed by Dr. Harrington Challenger.

## 2020-03-06 NOTE — Progress Notes (Signed)
EPIC Encounter for ICM Monitoring  Patient Name: Anthony Daugherty is a 74 y.o. male Date: 03/06/2020 Primary Care Physican: Tammi Sou, MD Primary Cardiologist:Turner Electrophysiologist:Allred Bi-V Pacing:98% 03/06/2020 Weight:172lbs    Spoke with wife and she stated patient is doing well.  He does not have any symptoms of fluid accumulation.  Discussed diet and she said he is eating foods that are high in salt.     CorVue thoracic impedancesuggesting possible fluid accumulation since 02/25/2020 and now trending close to baseline 03/02/2020.  Not prescribed a diuretic.  Labs: 11/12/2019 Creatinine 0.92, BUN 14, Potassium 4.2, Sodium 139, GFR 81-94 09/21/2019 Creatinine 0.92, BUN 15, Potassium 4.1, Sodium 136, GFR 80.63  Recommendations:Recommendation to limit salt intake to 2000 mg daily and fluid intake to 64 oz daily.  Encouraged to call if experiencing any fluid symptoms.   Follow-up plan: ICM clinic phone appointment on8/31/2021 to recheck fluid levels. 91 day device clinic remote transmission11/18/2021.  EP/Cardiology Office Visits: Recalls 05/03/2020 with Dr. Radford Pax and 05/15/2020 with Dr Rayann Heman.    Copy of ICM check sent to Dr. Rayann Heman.   3 month ICM trend: 03/02/2020    1 Year ICM trend:       Rosalene Billings, RN 03/06/2020 11:32 AM

## 2020-03-06 NOTE — Progress Notes (Signed)
Remote ICD transmission.   

## 2020-03-14 ENCOUNTER — Ambulatory Visit (INDEPENDENT_AMBULATORY_CARE_PROVIDER_SITE_OTHER): Payer: Medicare PPO

## 2020-03-14 DIAGNOSIS — Z95810 Presence of automatic (implantable) cardiac defibrillator: Secondary | ICD-10-CM

## 2020-03-14 DIAGNOSIS — I5022 Chronic systolic (congestive) heart failure: Secondary | ICD-10-CM

## 2020-03-17 NOTE — Progress Notes (Signed)
EPIC Encounter for ICM Monitoring  Patient Name: Anthony Daugherty is a 74 y.o. male Date: 03/17/2020 Primary Care Physican: Tammi Sou, MD Primary Cardiologist:Turner Electrophysiologist:Allred Bi-V Pacing:98% 03/06/2020 Weight:172lbs    Spoke with wife and patient is doing well.   CorVue thoracic impedancereturned to normal.  Not prescribed a diuretic.  Labs: 11/12/2019 Creatinine 0.92, BUN 14, Potassium 4.2, Sodium 139, GFR 81-94 09/21/2019 Creatinine 0.92, BUN 15, Potassium 4.1, Sodium 136, GFR 80.63  Recommendations:No changes and encouraged to call if experiencing any fluid symptoms.  Follow-up plan: ICM clinic phone appointment on10/10/2019. 91 day device clinic remote transmission11/18/2021.  EP/Cardiology Office Visits:Recalls 05/03/2020 with Dr.Turner and 05/15/2020 with Dr Rayann Heman.   Copy of ICM check sent to Dr.Allred.    3 month ICM trend: 03/14/2020    1 Year ICM trend:       Rosalene Billings, RN 03/17/2020 8:57 AM

## 2020-03-23 ENCOUNTER — Ambulatory Visit: Payer: Medicare PPO | Admitting: Family Medicine

## 2020-03-31 ENCOUNTER — Telehealth: Payer: Self-pay | Admitting: Family Medicine

## 2020-03-31 ENCOUNTER — Encounter: Payer: Self-pay | Admitting: Family Medicine

## 2020-03-31 ENCOUNTER — Other Ambulatory Visit: Payer: Self-pay

## 2020-03-31 ENCOUNTER — Ambulatory Visit: Payer: Medicare PPO | Admitting: Family Medicine

## 2020-03-31 VITALS — BP 131/73 | HR 63 | Temp 98.2°F | Resp 16 | Wt 169.8 lb

## 2020-03-31 DIAGNOSIS — G3184 Mild cognitive impairment, so stated: Secondary | ICD-10-CM

## 2020-03-31 DIAGNOSIS — F1010 Alcohol abuse, uncomplicated: Secondary | ICD-10-CM | POA: Diagnosis not present

## 2020-03-31 DIAGNOSIS — I2510 Atherosclerotic heart disease of native coronary artery without angina pectoris: Secondary | ICD-10-CM | POA: Diagnosis not present

## 2020-03-31 DIAGNOSIS — E7800 Pure hypercholesterolemia, unspecified: Secondary | ICD-10-CM | POA: Diagnosis not present

## 2020-03-31 DIAGNOSIS — I10 Essential (primary) hypertension: Secondary | ICD-10-CM

## 2020-03-31 LAB — BASIC METABOLIC PANEL
BUN: 17 mg/dL (ref 6–23)
CO2: 28 mEq/L (ref 19–32)
Calcium: 8.8 mg/dL (ref 8.4–10.5)
Chloride: 104 mEq/L (ref 96–112)
Creatinine, Ser: 0.89 mg/dL (ref 0.40–1.50)
GFR: 83.65 mL/min (ref >60.00)
Glucose, Bld: 85 mg/dL (ref 70–99)
Potassium: 4 mEq/L (ref 3.5–5.1)
Sodium: 140 mEq/L (ref 135–145)

## 2020-03-31 LAB — LIPID PANEL
Cholesterol: 157 mg/dL (ref 0–200)
HDL: 74.5 mg/dL (ref >39.00)
LDL Cholesterol: 73 mg/dL (ref 0–99)
NonHDL: 82.83
Total CHOL/HDL Ratio: 2
Triglycerides: 51 mg/dL (ref 0.0–149.0)
VLDL: 10.2 mg/dL (ref 0.0–40.0)

## 2020-03-31 NOTE — Progress Notes (Signed)
OFFICE VISIT  03/31/2020  CC:  Chief Complaint  Patient presents with  . Follow-up    RCI   HPI:    Patient is a 74 y.o. Caucasian male who presents accompanied by his wife for 6 mo f/u mild cognitive impairment with hx of alc abuse, HTN, HLD. He has mod/severe AR, dilated aortic root, CAD, and combined syst and diast CHF (ischemic +/- ETOH). Max medical treatment except I don't see any antiplatelet med on med list and he and wife states he does not take any.  He saw CV surgeon 12/08/19 and Dr. Cyndia Bent determined surgery for ascending aortic dilatation would not be needed or advisable at this time but if q6 mo echos show progressive AR and drop in EF then it could be reconsidered.  He feels well, wife says he has been doing well. Still drinking beer--nightly 2-3 beers, wife shakes her head and says he gets inebriated and can't remember what happened the day before.  He states he is never going to quit drinking.  He does not smoke.  He is apparently going to be in a "study" next year at [facility] regarding his memory dysfunction--his wife tells me this today.  She does not have any specifics, though.  No home bp monitoring.  HLD: tolerating statin fine.  Pt has no physical limitations: he can climb flights of stair, some chainsaw use, uses loppers---no sob or CP or dizziness.  ROS: no fevers, no CP, no SOB, no wheezing, no cough, no dizziness, no HAs, no rashes, no melena/hematochezia.  No polyuria or polydipsia.  No myalgias or arthralgias.  No focal weakness, paresthesias, or tremors.  No acute vision or hearing abnormalities. No n/v/d or abd pain.  No palpitations.     Past Medical History:  Diagnosis Date  . Aortic regurgitation    Mild-mod on echo 07/2017.  Moderate 08/2018.  Moderate-severe on echo 04/2019--> TEE 11/15/19 EF 50-55%, no LV dilation or WMA. Mod AV sclerosis w/out stenosis, mod-to-sev AI-.>rpt echo 6 mo  . Aortic root dilatation ([place]) 11/17/2019   11/17/19 45 mm aortic root  at the sinuses of Valsalva->Dr. Radford Pax referred pt to CT surgeon.  . Aortic stenosis, mild   . Arthritis    L-Spine  . CARDIAC MURMUR 10/62/6948; 5462   Systolic and diastolic as of 01/349.  . Cardiomyopathy ([place]) 10/12/2010   Global hypokinesis, EF 40-45% by cath and echo (2012 echo and 2019 echo).  Feb 2020 EF 20-25%. - mixed ICM/NICM (alcohol and CAD)  October 2020 EF 25-30%.  . Chronic combined systolic and diastolic CHF (congestive heart failure) (HCC)    max med mgmt, plus CRT-D placed 05/2019, EF improved to 50% after.  . Coronary artery disease 11/16/2010   a. 2012 - 80% OM lesion. b. Cath 12/2018 - showing 95% prox RCA, 75% OM1, 50% prox-mid LAD. Unsuccessful PCI to 100% chronically occluded RCA summer 2020.  . Dilated aortic root ([place]) 10/21/2018   72mm at sinuses of Valsalva  . Diverticulosis   . Erectile dysfunction 08/16/2011  . ESSENTIAL HYPERTENSION 07/19/2010   Qualifier: Diagnosis of  By: Anitra Lauth M.D., Brien Few   . GAD (generalized anxiety disorder)    managed by Dr. Toy Care with prozac 20mg  qd  . Gout    Usually Right great toe; colchicine helps well  . Habitual alcohol use   . Hx of colonoscopy 2004; 2014   Initial was normal; 2014-polypectomy and diverticulosis.  Recall 2019.  Marland Kitchen Hyperlipidemia 10/23/2012  . LBBB (left bundle  branch block)    CRT-D placed 05/2019  . Low back strain 10/24/2013  . Obesity, Class I, BMI 30-34.9   . Thyroid nodule 04/23/2013  . TOBACCO ABUSE 08/13/2010    Past Surgical History:  Procedure Laterality Date  . BIV ICD INSERTION CRT-D N/A 06/01/2019   Procedure: BIV ICD INSERTION CRT-D;  Surgeon: [name], MD;  Location: [place] [facility];  Service: Cardiovascular;  Laterality: N/A;  . CARDIAC CATHETERIZATION  10/12/10   diagnostic only: 1 vessel CAD with TIMI 3 flow, EF 40-45%.  Mild AS.  Medical mgmt of CAD.  Marland Kitchen COLONOSCOPY    . COLONOSCOPY W/ POLYPECTOMY  10/06/12   Diverticulosis and tubular adenoma w/out high grade dysplasia.   Repeat 5 yrs (Dr. Deatra Ina).  . CORONARY ATHERECTOMY N/A 01/08/2019   100% chronic RCA occlusion impossible to clear.  Med mgmt. Procedure: CORONARY ATHERECTOMY;  Surgeon: Sherren Mocha, MD;  Location: [facility];  Service: Cardiovascular;  Laterality: N/A;  . POLYPECTOMY    . RIGHT HEART CATH AND CORONARY ANGIOGRAPHY N/A 12/24/2018   95% RCA occlusion, 75% obtuse marginal occlusion (3V dz). Procedure: RIGHT HEART CATH AND CORONARY ANGIOGRAPHY;  Surgeon: Lorretta Harp, MD;  Location: [place] [facility];  Service: Cardiovascular;  Laterality: N/A;  . TEE WITHOUT CARDIOVERSION N/A 11/15/2019   EF 50-55%, no LV dilation or WMA. Mod AV sclerosis w/out stenosis, mod-to-sev AI.  Procedure: TRANSESOPHAGEAL ECHOCARDIOGRAM (TEE);  Surgeon: Fay Records, MD;  Location: [facility];  Service: Cardiovascular;  Laterality: N/A;  . TRANSTHORACIC ECHOCARDIOGRAM  09/2010; 07/2017; 08/2018; 10/2019   2012:  EF 40-45%, global hypokinesis, mild AS.  07/2017: no interval chg except mild/mod AR.  08/2018 EF 20-25%, global LV hypok, DD, biatrial enlargemt, mod AR, aortic root 3.8 cm. 10/2019 EF 50%, mild dec RV fxn, mod-sev AR, aor root dil 42 mm-->TEE to be done    Outpatient Medications Prior to Visit  Medication Sig Dispense Refill  . acetaminophen (TYLENOL) 500 MG tablet Take 500-1,000 mg by mouth every 6 (six) hours as needed (for pain.).    Marland Kitchen atorvastatin (LIPITOR) 40 MG tablet Take 40 mg by mouth daily.    . carvedilol (COREG) 25 MG tablet TAKE  (1)  TABLET TWICE A DAY. 180 tablet 2  . colchicine 0.6 MG tablet Take 0.6 mg by mouth as needed (gout).    Marland Kitchen ENTRESTO 49-51 MG TAKE  (1)  TABLET TWICE A DAY. 60 tablet 10  . FLUoxetine (PROZAC) 20 MG capsule Take 20 mg by mouth daily.     Marland Kitchen spironolactone (ALDACTONE) 25 MG tablet Take 12.5 mg by mouth daily.     No facility-administered medications prior to visit.    Allergies  Allergen Reactions  . Penicillins Swelling    Did it involve swelling of the  face/tongue/throat, SOB, or low BP? No Did it involve sudden or severe rash/hives, skin peeling, or any reaction on the inside of your mouth or nose? No Did you need to seek medical attention at a hospital or doctor's office? No When did it last happen? ~10 years ago If all above answers are "NO", may proceed with cephalosporin use.     ROS As per HPI  PE: Vitals with BMI 03/31/2020 12/08/2019 11/17/2019  Height - 5\' 5"  5\' 5"   Weight 169 lbs 13 oz 174 lbs 174 lbs  BMI - 16.10 96.04  Systolic 540 981 191  Diastolic 73 67 62  Pulse 63 63 65  Gen: Alert, well appearing.  Patient is oriented to person, place, time, and situation. AFFECT: pleasant, lucid thought and speech. CV: RRR, 2/6 systolic and 2/6 diastolic murmurs present. Chest is clear, no wheezing or rales. Normal symmetric air entry throughout both lung fields. No chest wall deformities or tenderness. EXT: no clubbing or cyanosis.  no edema.    LABS:  Lab Results  Component Value Date   TSH 2.350 12/21/2018   Lab Results  Component Value Date   WBC WILL FOLLOW 11/12/2019   WBC 3.5 11/12/2019   HGB WILL FOLLOW 11/12/2019   HGB 13.6 11/12/2019   HCT WILL FOLLOW 11/12/2019   HCT 39.0 11/12/2019   MCV WILL FOLLOW 11/12/2019   MCV 93 11/12/2019   PLT WILL FOLLOW 11/12/2019   PLT 189 11/12/2019   Lab Results  Component Value Date   CREATININE 0.93 11/12/2019   BUN 14 11/12/2019   NA 139 11/12/2019   K 4.2 11/12/2019   CL 106 11/12/2019   CO2 22 11/12/2019   Lab Results  Component Value Date   ALT 11 09/21/2019   AST 12 09/21/2019   ALKPHOS 73 09/21/2019   BILITOT 0.7 09/21/2019   Lab Results  Component Value Date   CHOL 143 09/21/2019   Lab Results  Component Value Date   HDL 62.10 09/21/2019   Lab Results  Component Value Date   LDLCALC 70 09/21/2019   Lab Results  Component Value Date   TRIG 58.0 09/21/2019   Lab Results  Component Value Date   CHOLHDL 2 09/21/2019   Lab Results   Component Value Date   PSA 2.54 08/01/2017   PSA 1.42 12/08/2015   PSA 1.59 07/22/2014    IMPRESSION AND PLAN:  1) HTN: The current medical regimen is effective;  continue present plan and medications. BMET today.  2) HLD: tolerating statin.  FLP today.  3) Alcohol abuse/alcoholism: he absolutely refuses to even consider quitting drinking.  4) CAD, cardiomyopathy, LBBB (now with good EF s/p max med mgmt, CRT-D). He is completely asymptomatic, without any physical limitations. Will continue to investigate why he is not on any antiplatelet med-->suspect this may be due to his habitual alcohol abuse and risk of falling.  5) Mod/sev AR, with mildly dilated aortic root and ascending aorta---per CV surgery-->no surgery recommended at this time.  6) Colonoscopy: reviewed EMR, 11/08/19 colonoscopy aborted prior to starting b/c of low bp initially and then discussion of some upcoming TEE as part of eval to see if any surgery needed.  Dr. Havery Moros recommended pt call back to reschedule after cardiac issues have been addressed.  Spent 45 min with pt today reviewing HPI, reviewing relevant past history, doing exam, reviewing and discussing lab and imaging data, and formulating plans.  An After Visit Summary was printed and given to the patient.  FOLLOW UP: Return in about 6 months (around 09/28/2020) for annual CPE (fasting).  Signed:  Crissie Sickles, MD           03/31/2020

## 2020-03-31 NOTE — Telephone Encounter (Signed)
Patient and wife advised of provider given information and patient is agreeable to plan.

## 2020-03-31 NOTE — Telephone Encounter (Signed)
Pls call Anthony Daugherty and his wife and tell them I reviewed his record and was able to determine why his colonoscopy was aborted back in April.  Dr. Havery Moros wanted to delay the procedure b/c he preferred that [name] have his cardiac issues fully addressed before getting colonoscopy.  Dr. Havery Moros wanted him to call GI office back to reschedule after cardiac issues were fully addressed. They can now call Dr. Doyne Keel office and reschedule colonoscopy.-thx

## 2020-04-04 ENCOUNTER — Encounter: Payer: Self-pay | Admitting: Family Medicine

## 2020-04-13 ENCOUNTER — Other Ambulatory Visit: Payer: Self-pay | Admitting: Physician Assistant

## 2020-04-17 ENCOUNTER — Ambulatory Visit (INDEPENDENT_AMBULATORY_CARE_PROVIDER_SITE_OTHER): Payer: Medicare PPO

## 2020-04-17 DIAGNOSIS — I5022 Chronic systolic (congestive) heart failure: Secondary | ICD-10-CM | POA: Diagnosis not present

## 2020-04-17 DIAGNOSIS — Z95810 Presence of automatic (implantable) cardiac defibrillator: Secondary | ICD-10-CM

## 2020-04-18 ENCOUNTER — Telehealth: Payer: Self-pay

## 2020-04-18 NOTE — Telephone Encounter (Signed)
Remote ICM transmission received.  Attempted call to wife/patient regarding ICM remote transmission and no answer or option to leave voice mail.

## 2020-04-18 NOTE — Progress Notes (Signed)
EPIC Encounter for ICM Monitoring  Patient Name: Anthony Daugherty is a 74 y.o. male Date: 04/18/2020 Primary Care Physican: Tammi Sou, MD Primary Cardiologist:Turner Electrophysiologist:Allred Bi-V Pacing:98% 03/06/2020 Weight:172lbs    Spoke with wife and patient is doing well.   CorVue thoracic impedancenormal.  Not prescribed a diuretic.  Labs: 03/31/2020 Creatinine 0.89, BUN 17, Potassium 4.0, Sodium 140, GFR 83.65 11/12/2019 Creatinine 0.92, BUN 14, Potassium 4.2, Sodium 139, GFR 81-94 09/21/2019 Creatinine 0.92, BUN 15, Potassium 4.1, Sodium 136, GFR 80.63  Recommendations:No changes and encouraged to call if experiencing any fluid symptoms.  Follow-up plan: ICM clinic phone appointment on11/02/2020. 91 day device clinic remote transmission11/18/2021.  EP/Cardiology Office Visits:Recalls 05/03/2020 with Dr.Turner and 05/15/2020 with Dr Rayann Heman.   Copy of ICM check sent to Dr.Allred.    3 month ICM trend: 04/17/2020    1 Year ICM trend:       Rosalene Billings, RN 04/18/2020 2:18 PM

## 2020-04-24 ENCOUNTER — Other Ambulatory Visit: Payer: Self-pay | Admitting: Physician Assistant

## 2020-05-22 ENCOUNTER — Ambulatory Visit (INDEPENDENT_AMBULATORY_CARE_PROVIDER_SITE_OTHER): Payer: Medicare PPO

## 2020-05-22 DIAGNOSIS — Z95810 Presence of automatic (implantable) cardiac defibrillator: Secondary | ICD-10-CM | POA: Diagnosis not present

## 2020-05-22 DIAGNOSIS — I5022 Chronic systolic (congestive) heart failure: Secondary | ICD-10-CM

## 2020-05-25 ENCOUNTER — Other Ambulatory Visit: Payer: Self-pay | Admitting: Physician Assistant

## 2020-05-26 ENCOUNTER — Telehealth: Payer: Self-pay

## 2020-05-26 NOTE — Telephone Encounter (Signed)
Remote ICM transmission received.  Attempted call to wife/patient regarding ICM remote transmission and left detailed message per DPR.  Advised to return call for any fluid symptoms or questions. Next ICM remote transmission scheduled 06/26/2020.

## 2020-05-26 NOTE — Progress Notes (Signed)
EPIC Encounter for ICM Monitoring  Patient Name: Anthony Daugherty is a 74 y.o. male Date: 05/26/2020 Primary Care Physican: Tammi Sou, MD Primary Cardiologist:Turner Electrophysiologist:Allred Bi-V Pacing:98% 03/06/2020 Weight:172lbs    Attempted call to wife/patient and unable to reach.  Left detailed message per DPR regarding transmission. Transmission reviewed.   CorVue thoracic impedancenormal.  Not prescribed a diuretic.  Labs: 03/31/2020 Creatinine 0.89, BUN 17, Potassium 4.0, Sodium 140, GFR 83.65 11/12/2019 Creatinine 0.92, BUN 14, Potassium 4.2, Sodium 139, GFR 81-94 09/21/2019 Creatinine 0.92, BUN 15, Potassium 4.1, Sodium 136, GFR 80.63  Recommendations:Left voice mail with ICM number and encouraged to call if experiencing any fluid symptoms.  Follow-up plan: ICM clinic phone appointment on12/13/2021. 91 day device clinic remote transmission11/18/2021.  EP/Cardiology Office Visits:Recalls 05/03/2020 with Dr.Turner and 05/15/2020 with Dr Rayann Heman.   Copy of ICM check sent to Dr.Allred.   3 month ICM trend: 05/22/2020    1 Year ICM trend:       Rosalene Billings, RN 05/26/2020 2:34 PM

## 2020-05-29 ENCOUNTER — Ambulatory Visit (HOSPITAL_COMMUNITY): Payer: Medicare PPO | Attending: Cardiology

## 2020-05-29 ENCOUNTER — Other Ambulatory Visit: Payer: Self-pay

## 2020-05-29 DIAGNOSIS — I351 Nonrheumatic aortic (valve) insufficiency: Secondary | ICD-10-CM | POA: Insufficient documentation

## 2020-05-29 LAB — ECHOCARDIOGRAM COMPLETE
AR max vel: 1.16 cm2
AV Area VTI: 1.04 cm2
AV Area mean vel: 1.11 cm2
AV Mean grad: 10 mmHg
AV Peak grad: 18.1 mmHg
Ao pk vel: 2.13 m/s
Area-P 1/2: 1.32 cm2
P 1/2 time: 463 msec
S' Lateral: 3.85 cm

## 2020-05-30 ENCOUNTER — Encounter: Payer: Self-pay | Admitting: Family Medicine

## 2020-05-31 ENCOUNTER — Telehealth: Payer: Self-pay

## 2020-05-31 DIAGNOSIS — I351 Nonrheumatic aortic (valve) insufficiency: Secondary | ICD-10-CM

## 2020-05-31 DIAGNOSIS — I712 Thoracic aortic aneurysm, without rupture, unspecified: Secondary | ICD-10-CM

## 2020-05-31 DIAGNOSIS — I77810 Thoracic aortic ectasia: Secondary | ICD-10-CM

## 2020-05-31 DIAGNOSIS — I420 Dilated cardiomyopathy: Secondary | ICD-10-CM

## 2020-05-31 NOTE — Telephone Encounter (Signed)
The patient's wife has been notified of the result and verbalized understanding.  All questions (if any) were answered. Antonieta Iba, RN 05/31/2020 4:07 PM  Echocardiogram has been ordered to be repeated in 6 months.

## 2020-05-31 NOTE — Telephone Encounter (Signed)
-----   Message from Sueanne Margarita, MD sent at 05/30/2020 10:36 AM EST ----- Echo showed mildly reduced LVF with severely thickened heart muscle and increased stiffness of heart muscle which is normal for his age.  His LA is enlarged.  Mildly leaky MV.  Mild aortic stenosis and moderately leaky AV.  Mildly enlarged ascending aorta but not aneurysm.  Repeat echo in 6 months

## 2020-06-01 ENCOUNTER — Ambulatory Visit (INDEPENDENT_AMBULATORY_CARE_PROVIDER_SITE_OTHER): Payer: Medicare PPO

## 2020-06-01 DIAGNOSIS — I420 Dilated cardiomyopathy: Secondary | ICD-10-CM

## 2020-06-03 LAB — CUP PACEART REMOTE DEVICE CHECK
Battery Remaining Longevity: 78 mo
Battery Remaining Percentage: 86 %
Battery Voltage: 2.96 V
Brady Statistic AP VP Percent: 28 %
Brady Statistic AP VS Percent: 1 %
Brady Statistic AS VP Percent: 70 %
Brady Statistic AS VS Percent: 1 %
Brady Statistic RA Percent Paced: 26 %
Date Time Interrogation Session: 20211119151104
HighPow Impedance: 57 Ohm
Implantable Lead Implant Date: 20201117
Implantable Lead Implant Date: 20201117
Implantable Lead Implant Date: 20201117
Implantable Lead Location: 753858
Implantable Lead Location: 753859
Implantable Lead Location: 753860
Implantable Pulse Generator Implant Date: 20201117
Lead Channel Impedance Value: 400 Ohm
Lead Channel Impedance Value: 410 Ohm
Lead Channel Impedance Value: 690 Ohm
Lead Channel Pacing Threshold Amplitude: 0.5 V
Lead Channel Pacing Threshold Amplitude: 0.75 V
Lead Channel Pacing Threshold Amplitude: 1.125 V
Lead Channel Pacing Threshold Pulse Width: 0.5 ms
Lead Channel Pacing Threshold Pulse Width: 0.5 ms
Lead Channel Pacing Threshold Pulse Width: 0.5 ms
Lead Channel Sensing Intrinsic Amplitude: 11.8 mV
Lead Channel Sensing Intrinsic Amplitude: 2.4 mV
Lead Channel Setting Pacing Amplitude: 1.5 V
Lead Channel Setting Pacing Amplitude: 1.625
Lead Channel Setting Pacing Amplitude: 2 V
Lead Channel Setting Pacing Pulse Width: 0.5 ms
Lead Channel Setting Pacing Pulse Width: 0.5 ms
Lead Channel Setting Sensing Sensitivity: 0.5 mV
Pulse Gen Serial Number: 111012743

## 2020-06-05 NOTE — Progress Notes (Signed)
Remote ICD transmission.   

## 2020-06-19 ENCOUNTER — Other Ambulatory Visit: Payer: Self-pay | Admitting: Family Medicine

## 2020-06-19 NOTE — Telephone Encounter (Signed)
RF request for colchicine LOV: 03/31/20 Next ov: 09/29/20 Last written: n/a  Please advise, thanks. Medication pending

## 2020-06-19 NOTE — Telephone Encounter (Signed)
Left detailed message advising refill submitted, okay per [facility]

## 2020-07-04 NOTE — Progress Notes (Signed)
No ICM remote transmission received for 06/26/2020 and next ICM transmission scheduled for 07/17/2020.   

## 2020-07-19 ENCOUNTER — Telehealth (INDEPENDENT_AMBULATORY_CARE_PROVIDER_SITE_OTHER): Payer: Medicare PPO | Admitting: Cardiology

## 2020-07-19 ENCOUNTER — Encounter: Payer: Self-pay | Admitting: Cardiology

## 2020-07-19 ENCOUNTER — Other Ambulatory Visit: Payer: Self-pay

## 2020-07-19 VITALS — Ht 65.0 in | Wt 175.0 lb

## 2020-07-19 DIAGNOSIS — I420 Dilated cardiomyopathy: Secondary | ICD-10-CM | POA: Diagnosis not present

## 2020-07-19 DIAGNOSIS — I5042 Chronic combined systolic (congestive) and diastolic (congestive) heart failure: Secondary | ICD-10-CM | POA: Diagnosis not present

## 2020-07-19 DIAGNOSIS — I359 Nonrheumatic aortic valve disorder, unspecified: Secondary | ICD-10-CM | POA: Diagnosis not present

## 2020-07-19 DIAGNOSIS — I2510 Atherosclerotic heart disease of native coronary artery without angina pectoris: Secondary | ICD-10-CM | POA: Diagnosis not present

## 2020-07-19 DIAGNOSIS — I77810 Thoracic aortic ectasia: Secondary | ICD-10-CM | POA: Diagnosis not present

## 2020-07-19 DIAGNOSIS — E7800 Pure hypercholesterolemia, unspecified: Secondary | ICD-10-CM | POA: Diagnosis not present

## 2020-07-19 NOTE — Patient Instructions (Signed)
Medication Instructions:  Your physician recommends that you continue on your current medications as directed. Please refer to the Current Medication list given to you today.  *If you need a refill on your cardiac medications before your next appointment, please call your pharmacy*   Lab Work: none If you have labs (blood work) drawn today and your tests are completely normal, you will receive your results only by: . MyChart Message (if you have MyChart) OR . A paper copy in the mail If you have any lab test that is abnormal or we need to change your treatment, we will call you to review the results.   Testing/Procedures: none   Follow-Up: At [facility], you and your health needs are our priority.  As part of our continuing mission to provide you with exceptional heart care, we have created designated Provider Care Teams.  These Care Teams include your primary Cardiologist (physician) and Advanced Practice Providers (APPs -  Physician Assistants and Nurse Practitioners) who all work together to provide you with the care you need, when you need it.  We recommend signing up for the patient portal called "MyChart".  Sign up information is provided on this After Visit Summary.  MyChart is used to connect with patients for Virtual Visits (Telemedicine).  Patients are able to view lab/test results, encounter notes, upcoming appointments, etc.  Non-urgent messages can be sent to your provider as well.   To learn more about what you can do with MyChart, go to [email or web address]://[email or web address].com.    Your next appointment:   6 month(s)  The format for your next appointment:   In Person  Provider:   You may see Traci Turner, MD or one of the following Advanced Practice Providers on your designated Care Team:    Dayna Dunn, PA-C  Michele Lenze, PA-C    Other Instructions   

## 2020-07-19 NOTE — Progress Notes (Signed)
Virtual Visit via Video Note   This visit type was conducted due to national recommendations for restrictions regarding the COVID-19 Pandemic (e.g. social distancing) in an effort to limit this patient's exposure and mitigate transmission in our community.  Due to his co-morbid illnesses, this patient is at least at moderate risk for complications without adequate follow up.  This format is felt to be most appropriate for this patient at this time.  All issues noted in this document were discussed and addressed.  A limited physical exam was performed with this format.  Please refer to the patient's chart for his consent to telehealth for [facility].   Evaluation Performed:  Follow-up visit  This visit type was conducted due to national recommendations for restrictions regarding the COVID-19 Pandemic (e.g. social distancing).  This format is felt to be most appropriate for this patient at this time.  All issues noted in this document were discussed and addressed.  No physical exam was performed (except for noted visual exam findings with Video Visits).  Please refer to the patient's chart (MyChart message for video visits and phone note for telephone visits) for the patient's consent to telehealth for [facility].  Date:  07/19/2020   ID:  Anthony Daugherty, DOB 1945-09-27, MRN [id number]  Patient Location:  [place]  Provider location:   [place]  PCP:  Tammi Sou, MD  Cardiologist:  Fransico Him, MD  Electrophysiologist:  [name], MD   Chief Complaint:  DCM and AI  History of Present Illness: He   Anthony Daugherty is a 75 y.o. male who presents via audio/video conferencing for a telehealth visit today.    This is a 75yo male with a history of aortic stenosis and AR with dilated aortic root.  He apparently had an echo 07/2017 showing mild aortic stenosis and mild to moderate AR and aortic root 6mm.  He also has a history of HTN, hyperlipidemia, nonischemic DCM with  EF 40-45 by cath and echo in 2012 and ASCAD with 80% OM lesion medically managed.    Repeat echo 08/2018 showed moderate AR with aortic root 69mm.  Unfortunately his echo also shows that his LVF has significantly declined with EF 20-25% with mildly dilated LV and diffuse HK.  There was severe LAE and moderate RAE and mild pulmonary HTN. At his initial OV in [id number], his Bystolic was changed to Carvedilol and he was continued on ARB.    He underwent cath 12/2018 showing 95% pRCA, 75% OM1 and 50% prox to mid LAD.  He underwent attempted PCI of the RCA but unable to cross the lesion and medical therapy was recommended.  It was felt that his CM was mixed ICM/NICM with possible ETOH contribution as well.  He has seen my extenders several times trying to titrate HF meds but has been limited by soft Bps.  He was on Entresto 49/51mg  BID, Carvedilol 25mg  BID and repeat echo 04/2019 showed EF 25-30% with mils AS and moderate to severe AI.  There was no M spike on SPEP/UPEP and TSH and ferritin were normal.  He was referred to EP and underwent [employer] BiV ICD on 06/01/2019.    2D echo 05/2020 showed improved LVF after BiVICD with EF 45-50%, mild AS and moderate AR, mild MR.   He is here today for followup and is doing well.  He denies any chest pain or pressure, SOB, DOE, PND, orthopnea, LE edema, dizziness, palpitations or syncope. He is compliant with his  meds and is tolerating meds with no SE.    The patient does not have symptoms concerning for COVID-19 infection (fever, chills, cough, or new shortness of breath).    Prior CV studies:   The following studies were reviewed today:  2D echo 05/2020 IMPRESSIONS    1. Left ventricular ejection fraction, by estimation, is 45 to 50%. The  left ventricle has mildly decreased function. The left ventricle has no  regional wall motion abnormalities. There is severe left ventricular  hypertrophy of the basal-septal segment.  Left ventricular diastolic parameters  are consistent with Grade I  diastolic dysfunction (impaired relaxation).  2. Right ventricular systolic function is normal. The right ventricular  size is normal. There is normal pulmonary artery systolic pressure. The  estimated right ventricular systolic pressure is 99991111 mmHg.  3. Left atrial size was severely dilated.  4. The mitral valve is degenerative. Mild mitral valve regurgitation. No  evidence of mitral stenosis.  5. The aortic valve is calcified. There is moderate calcification of the  aortic valve. There is moderate thickening of the aortic valve. Aortic  valve regurgitation is moderate and eccentrically directed towards the  anterior MV leaflet with early closure  of the AMVL due to AR jet. Mild aortic valve stenosis. Aortic  regurgitation PHT measures 463 msec. Aortic valve area, by VTI measures  1.04 cm. Aortic valve mean gradient measures 10.0 mmHg. Aortic valve Vmax  measures 2.13 m/s. Unable to accurately  measure vena contracta or PHT due to eccentric jet.  6. Aortic dilatation noted. There is mild dilatation of the ascending  aorta, measuring 38 mm.  7. The inferior vena cava is normal in size with greater than 50%  respiratory variability, suggesting right atrial pressure of 3 mmHg.  8. Compared to prior echo, LVESD is normal at 33mm and LVEDD 52mm. There  is at least moderate aortic insufficiency that is directed eccentrically  towards the AMVL with early closure of the MV. No change in the mean AVG  from prior study but DI has  decreased.    Past Medical History:  Diagnosis Date  . Aortic regurgitation    Mild-mod on echo 07/2017.  Moderate 08/2018.  Moderate-severe on echo 04/2019--> TEE 11/15/19 EF 50-55%, no LV dilation or WMA. Mod AV sclerosis w/out stenosis, mod-to-sev AI-.>rpt echo 6 mo  . Aortic root dilatation ([place]) 11/17/2019   11/17/19 45 mm aortic root at the sinuses of Valsalva->Dr. Radford Pax referred pt to CT surgeon.  . Aortic stenosis, mild    . Arthritis    L-Spine  . CARDIAC MURMUR [id number]; [id number]   Systolic and diastolic as of [id number].  . Cardiomyopathy ([place]) 10/12/2010   Global hypokinesis, EF 40-45% by cath and echo (2012 echo and 2019 echo).  Feb 2020 EF 20-25%. - mixed ICM/NICM (alcohol and CAD)  October 2020 EF 25-30%.  . Chronic combined systolic and diastolic CHF (congestive heart failure) (HCC)    max med mgmt, plus CRT-D placed 05/2019, EF improved to 50% after.  . Coronary artery disease 11/16/2010   a. 2012 - 80% OM lesion. b. Cath 12/2018 - showing 95% prox RCA, 75% OM1, 50% prox-mid LAD. Unsuccessful PCI to 100% chronically occluded RCA summer 2020.  . Dilated aortic root ([place]) 10/21/2018   37mm at sinuses of Valsalva  . Diverticulosis   . Erectile dysfunction 08/16/2011  . ESSENTIAL HYPERTENSION 07/19/2010   Qualifier: Diagnosis of  By: Anitra Lauth M.D., Brien Few   . GAD (generalized anxiety disorder)  managed by Dr. Evelene Croon with prozac 20mg  qd  . Gout    Usually Right great toe; colchicine helps well  . Habitual alcohol use   . Hx of colonoscopy 2004; 2014   Initial was normal; 2014-polypectomy and diverticulosis.  Recall 2019.  2020 Hyperlipidemia 10/23/2012  . LBBB (left bundle branch block)    CRT-D placed 05/2019  . Low back strain 10/24/2013  . Obesity, Class I, BMI 30-34.9   . Thyroid nodule 04/23/2013  . TOBACCO ABUSE 08/13/2010   Past Surgical History:  Procedure Laterality Date  . BIV ICD INSERTION CRT-D N/A 06/01/2019   Procedure: BIV ICD INSERTION CRT-D;  Surgeon: 06/03/2019, MD;  Location: [facility] INVASIVE CV LAB;  Service: Cardiovascular;  Laterality: N/A;  . CARDIAC CATHETERIZATION  10/12/10   diagnostic only: 1 vessel CAD with TIMI 3 flow, EF 40-45%.  Mild AS.  Medical mgmt of CAD.  10/14/10 COLONOSCOPY    . COLONOSCOPY W/ POLYPECTOMY  10/06/12   Diverticulosis and tubular adenoma w/out high grade dysplasia.  Repeat 5 yrs (Dr. 10/08/12).  . CORONARY ATHERECTOMY N/A 01/08/2019   100% chronic RCA occlusion  impossible to clear.  Med mgmt. Procedure: CORONARY ATHERECTOMY;  Surgeon: 01/10/2019, MD;  Location: [facility] INVASIVE CV LAB;  Service: Cardiovascular;  Laterality: N/A;  . POLYPECTOMY    . RIGHT HEART CATH AND CORONARY ANGIOGRAPHY N/A 12/24/2018   95% RCA occlusion, 75% obtuse marginal occlusion (3V dz). Procedure: RIGHT HEART CATH AND CORONARY ANGIOGRAPHY;  Surgeon: 02/23/2019, MD;  Location: [facility] INVASIVE CV LAB;  Service: Cardiovascular;  Laterality: N/A;  . TEE WITHOUT CARDIOVERSION N/A 11/15/2019   EF 50-55%, no LV dilation or WMA. Mod AV sclerosis w/out stenosis, mod-to-sev AI.  Procedure: TRANSESOPHAGEAL ECHOCARDIOGRAM (TEE);  Surgeon: 01/15/2020, MD;  Location: [facility] ENDOSCOPY;  Service: Cardiovascular;  Laterality: N/A;  . TRANSTHORACIC ECHOCARDIOGRAM  09/2010; 07/2017; 08/2018; 10/2019;05/2020   2012:  EF 40-45%, global hypokinesis, mild AS.  07/2017: no interval chg except mild/mod AR.  08/2018 EF 20-25%, global LV hypok, DD, biatrial enlargemt, mod AR, aortic root 3.8 cm. 10/2019 EF 50%, mild dec RV fxn, mod-sev AR, aor root dil 42 mm. 05/2020 EF 45-50%,grd I DD, mod AI, aorta 29mm, severe LAE.     Current Meds  Medication Sig  . acetaminophen (TYLENOL) 500 MG tablet Take 500-1,000 mg by mouth every 6 (six) hours as needed (for pain.).  20m atorvastatin (LIPITOR) 40 MG tablet TAKE 1 TABLET DAILY  . carvedilol (COREG) 25 MG tablet TAKE  (1)  TABLET TWICE A DAY.  Marland Kitchen colchicine 0.6 MG tablet TAKE 2 TABLETS AT ONSET OF GOUT PAIN, REPEAT IN 1 HOUR, THEN 1 TABLETTWICE/DAY TIL RESOLVED  . ENTRESTO 49-51 MG TAKE  (1)  TABLET TWICE A DAY.  Marland Kitchen FLUoxetine (PROZAC) 20 MG capsule Take 20 mg by mouth daily.   Marland Kitchen spironolactone (ALDACTONE) 25 MG tablet TAKE (1/2) TABLET DAILY.     Allergies:   Penicillins   Social History   Tobacco Use  . Smoking status: Former Marland Kitchen  . Smokeless tobacco: Current User    Types: Chew  . Tobacco comment: chews tobacco  Vaping Use  . Vaping Use: Never used   Substance Use Topics  . Alcohol use: Yes    Alcohol/week: 28.0 standard drinks    Types: 28 Cans of beer per week  . Drug use: No     Family Hx: The patient's family history includes Alzheimer's disease in his mother; COPD in his father;  Congenital heart disease in his brother; Heart failure in his father. There is no history of Colon cancer, Esophageal cancer, Stomach cancer, Rectal cancer, or Colon polyps.  ROS:   Please see the history of present illness.     All other systems reviewed and are negative.   Labs/Other Tests and Data Reviewed:    Recent Labs: 09/21/2019: ALT 11 11/12/2019: Hemoglobin WILL FOLLOW; Hemoglobin 13.6; Platelets WILL FOLLOW; Platelets 189 03/31/2020: BUN 17; Creatinine, Ser 0.89; Potassium 4.0; Sodium 140   Recent Lipid Panel Lab Results  Component Value Date/Time   CHOL 157 03/31/2020 11:29 AM   CHOL 141 04/26/2019 09:12 AM   TRIG 51.0 03/31/2020 11:29 AM   TRIG 105 05/10/2008 12:00 AM   HDL 74.50 03/31/2020 11:29 AM   HDL 72 04/26/2019 09:12 AM   CHOLHDL 2 03/31/2020 11:29 AM   LDLCALC 73 03/31/2020 11:29 AM   LDLCALC 57 04/26/2019 09:12 AM   LDLCALC 101 05/10/2008 12:00 AM    Wt Readings from Last 3 Encounters:  07/19/20 175 lb (79.4 kg)  03/31/20 169 lb 12.8 oz (77 kg)  12/08/19 174 lb (78.9 kg)     Objective:    Vital Signs:  Ht 5\' 5"  (1.651 m)   Wt 175 lb (79.4 kg)   BMI 29.12 kg/m    Well nourished, well developed male in no acute distress. Well appearing, alert and conversant, regular work of breathing,  good skin color  Eyes- anicteric mouth- oral mucosa is pink  neuro- grossly intact skin- no apparent rash or lesions or cyanosis   ASSESSMENT & PLAN:    1.  Severe mixed DCM -EF on last echo 05/2020 showed improved LVF with EF 45-50% after BiV AICD implant a year ago -continue Entresto, Carvedilol and spiro  2.  Chronic combined systolic/diastolic CHF -he is NYHA class 2a -he denies any sx of volume overload although  his weight has increased since the fall but is the same as last Spring -continue Entresto 49-51mg  BID, carvedilol 25mg  BID, spiro 12.5mg  daily -creatinine was stable at 0.89 and K+ 4 in Sept 2021  3.  Aortic valve disease -2D echo 05/2020 stable with moderate AR visually but by PHT 423msec  -mild AS with mean AVG 37mmHg by echo 05/2020 -repeat echo May 2021  4.  Mild ascending aortic and aortic root dilatation -2D echo 04/2019 showed 4.3cm aortic root and 4.4cm Ascending aorta -Chest CTA measuring aortic root at SOV 63mm, 47mm in ascending aorta and abdominal CTA with aortic atherosclerosis but no AAA -continue statin and BP control  5.  ASCAD - cath 12/2018 showing 95% pRCA, 75% OM1 and 50% prox to mid LAD.  He underwent attempted PCI of the RCA but unable to cross the lesion and medical therapy was recommended. -he denies any anginal sx -continue ASA, BB and statin  6.  HLD -LDL goal < 70 -continue atorvastatin 40mg  daily -LDL was 73 in Sept 2021  COVID-19 Education: The signs and symptoms of COVID-19 were discussed with the patient and how to seek care for testing (follow up with PCP or arrange E-visit).  The importance of social distancing was discussed today.  Patient Risk:   After full review of this patient's clinical status, I feel that they are at least moderate risk at this time.  Time:   Today, I have spent 20 minutes  on telemedicine discussing medical problems including CAD, HLD, DCM, CHF and reviewing patient's chart including cardiac cath, 2D echo, labs.  Medication Adjustments/Labs  and Tests Ordered: Current medicines are reviewed at length with the patient today.  Concerns regarding medicines are outlined above.  Tests Ordered: No orders of the defined types were placed in this encounter.  Medication Changes: No orders of the defined types were placed in this encounter.   Disposition:  Follow up in 6 month(s)  Signed, Fransico Him, MD  07/19/2020 9:49 AM     [facility]

## 2020-07-24 ENCOUNTER — Ambulatory Visit (INDEPENDENT_AMBULATORY_CARE_PROVIDER_SITE_OTHER): Payer: Medicare PPO

## 2020-07-24 ENCOUNTER — Telehealth: Payer: Self-pay

## 2020-07-24 DIAGNOSIS — Z95810 Presence of automatic (implantable) cardiac defibrillator: Secondary | ICD-10-CM

## 2020-07-24 DIAGNOSIS — I5022 Chronic systolic (congestive) heart failure: Secondary | ICD-10-CM

## 2020-07-24 NOTE — Progress Notes (Signed)
EPIC Encounter for ICM Monitoring  Patient Name: Anthony Daugherty is a 75 y.o. male Date: 07/24/2020 Primary Care Physican: Tammi Sou, MD Primary Cardiologist:Turner Electrophysiologist:Allred Bi-V Pacing:98% 03/06/2020 Weight:172lbs    Attempted call to wife/patient and unable to reach.  Left detailed message per DPR regarding transmission. Transmission reviewed.   CorVue thoracic impedancenormal.  Not prescribed a diuretic.  Labs: 03/31/2020 Creatinine 0.89, BUN 17, Potassium 4.0, Sodium 140, GFR 83.65 11/12/2019 Creatinine 0.92, BUN 14, Potassium 4.2, Sodium 139, GFR 81-94 09/21/2019 Creatinine 0.92, BUN 15, Potassium 4.1, Sodium 136, GFR 80.63  Recommendations:Left voice mail with ICM number and encouraged to call if experiencing any fluid symptoms.  Follow-up plan: ICM clinic phone appointment on2/14/2022. 91 day device clinic remote transmission2/17/2022.  EP/Cardiology Office Visits:Recalls 05/03/2020 with Dr.Turner and 05/15/2020 with Dr Rayann Heman.   Copy of ICM check sent to Dr.Allred.  3 month ICM trend: 07/24/2020.    1 Year ICM trend:       Rosalene Billings, RN 07/24/2020 4:41 PM

## 2020-07-24 NOTE — Telephone Encounter (Signed)
Remote ICM transmission received.  Attempted call to wife regarding ICM remote transmission and no answer. 

## 2020-08-22 ENCOUNTER — Other Ambulatory Visit: Payer: Self-pay | Admitting: Physician Assistant

## 2020-08-23 ENCOUNTER — Ambulatory Visit (INDEPENDENT_AMBULATORY_CARE_PROVIDER_SITE_OTHER): Payer: Medicare PPO

## 2020-08-23 VITALS — Ht 65.5 in | Wt 169.0 lb

## 2020-08-23 DIAGNOSIS — Z0000 Encounter for general adult medical examination without abnormal findings: Secondary | ICD-10-CM

## 2020-08-23 NOTE — Patient Instructions (Signed)
Anthony Daugherty , Thank you for taking time to complete your Medicare Wellness Visit. I appreciate your ongoing commitment to your health goals. Please review the following plan we discussed and let me know if I can assist you in the future.   Screening recommendations/referrals: Colonoscopy: Please follow recommendations of your GI doctor. Recommended yearly ophthalmology/optometry visit for glaucoma screening and checkup Recommended yearly dental visit for hygiene and checkup  Vaccinations: Influenza vaccine: Up to date Pneumococcal vaccine: Completed vaccines Tdap vaccine: Up to date-Due-08/27/2027 Shingles vaccine: Completed vaccines  Covid-19: Completed vaccines  Advanced directives: Copy in chart  Conditions/risks identified: See problem list  Next appointment: Follow up in one year for your annual wellness visit. 08/29/2021 @ 2:15  Preventive Care 74 Years and Older, Male Preventive care refers to lifestyle choices and visits with your health care provider that can promote health and wellness. What does preventive care include?  A yearly physical exam. This is also called an annual well check.  Dental exams once or twice a year.  Routine eye exams. Ask your health care provider how often you should have your eyes checked.  Personal lifestyle choices, including:  Daily care of your teeth and gums.  Regular physical activity.  Eating a healthy diet.  Avoiding tobacco and drug use.  Limiting alcohol use.  Practicing safe sex.  Taking low doses of aspirin every day.  Taking vitamin and mineral supplements as recommended by your health care provider. What happens during an annual well check? The services and screenings done by your health care provider during your annual well check will depend on your age, overall health, lifestyle risk factors, and family history of disease. Counseling  Your health care provider may ask you questions about your:  Alcohol  use.  Tobacco use.  Drug use.  Emotional well-being.  Home and relationship well-being.  Sexual activity.  Eating habits.  History of falls.  Memory and ability to understand (cognition).  Work and work Statistician. Screening  You may have the following tests or measurements:  Height, weight, and BMI.  Blood pressure.  Lipid and cholesterol levels. These may be checked every 5 years, or more frequently if you are over 87 years old.  Skin check.  Lung cancer screening. You may have this screening every year starting at age 16 if you have a 30-pack-year history of smoking and currently smoke or have quit within the past 15 years.  Fecal occult blood test (FOBT) of the stool. You may have this test every year starting at age 55.  Flexible sigmoidoscopy or colonoscopy. You may have a sigmoidoscopy every 5 years or a colonoscopy every 10 years starting at age 91.  Prostate cancer screening. Recommendations will vary depending on your family history and other risks.  Hepatitis C blood test.  Hepatitis B blood test.  Sexually transmitted disease (STD) testing.  Diabetes screening. This is done by checking your blood sugar (glucose) after you have not eaten for a while (fasting). You may have this done every 1-3 years.  Abdominal aortic aneurysm (AAA) screening. You may need this if you are a current or former smoker.  Osteoporosis. You may be screened starting at age 99 if you are at high risk. Talk with your health care provider about your test results, treatment options, and if necessary, the need for more tests. Vaccines  Your health care provider may recommend certain vaccines, such as:  Influenza vaccine. This is recommended every year.  Tetanus, diphtheria, and acellular pertussis (Tdap,  Td) vaccine. You may need a Td booster every 10 years.  Zoster vaccine. You may need this after age 41.  Pneumococcal 13-valent conjugate (PCV13) vaccine. One dose is  recommended after age 96.  Pneumococcal polysaccharide (PPSV23) vaccine. One dose is recommended after age 6. Talk to your health care provider about which screenings and vaccines you need and how often you need them. This information is not intended to replace advice given to you by your health care provider. Make sure you discuss any questions you have with your health care provider. Document Released: 07/28/2015 Document Revised: 03/20/2016 Document Reviewed: 05/02/2015 Elsevier Interactive Patient Education  2017 [place] Prevention in the Home Falls can cause injuries. They can happen to people of all ages. There are many things you can do to make your home safe and to help prevent falls. What can I do on the outside of my home?  Regularly fix the edges of walkways and driveways and fix any cracks.  Remove anything that might make you trip as you walk through a door, such as a raised step or threshold.  Trim any bushes or trees on the path to your home.  Use bright outdoor lighting.  Clear any walking paths of anything that might make someone trip, such as rocks or tools.  Regularly check to see if handrails are loose or broken. Make sure that both sides of any steps have handrails.  Any raised decks and porches should have guardrails on the edges.  Have any leaves, snow, or ice cleared regularly.  Use sand or salt on walking paths during winter.  Clean up any spills in your garage right away. This includes oil or grease spills. What can I do in the bathroom?  Use night lights.  Install grab bars by the toilet and in the tub and shower. Do not use towel bars as grab bars.  Use non-skid mats or decals in the tub or shower.  If you need to sit down in the shower, use a plastic, non-slip stool.  Keep the floor dry. Clean up any water that spills on the floor as soon as it happens.  Remove soap buildup in the tub or shower regularly.  Attach bath mats  securely with double-sided non-slip rug tape.  Do not have throw rugs and other things on the floor that can make you trip. What can I do in the bedroom?  Use night lights.  Make sure that you have a light by your bed that is easy to reach.  Do not use any sheets or blankets that are too big for your bed. They should not hang down onto the floor.  Have a firm chair that has side arms. You can use this for support while you get dressed.  Do not have throw rugs and other things on the floor that can make you trip. What can I do in the kitchen?  Clean up any spills right away.  Avoid walking on wet floors.  Keep items that you use a lot in easy-to-reach places.  If you need to reach something above you, use a strong step stool that has a grab bar.  Keep electrical cords out of the way.  Do not use floor polish or wax that makes floors slippery. If you must use wax, use non-skid floor wax.  Do not have throw rugs and other things on the floor that can make you trip. What can I do with my stairs?  Do not  leave any items on the stairs.  Make sure that there are handrails on both sides of the stairs and use them. Fix handrails that are broken or loose. Make sure that handrails are as long as the stairways.  Check any carpeting to make sure that it is firmly attached to the stairs. Fix any carpet that is loose or worn.  Avoid having throw rugs at the top or bottom of the stairs. If you do have throw rugs, attach them to the floor with carpet tape.  Make sure that you have a light switch at the top of the stairs and the bottom of the stairs. If you do not have them, ask someone to add them for you. What else can I do to help prevent falls?  Wear shoes that:  Do not have high heels.  Have rubber bottoms.  Are comfortable and fit you well.  Are closed at the toe. Do not wear sandals.  If you use a stepladder:  Make sure that it is fully opened. Do not climb a closed  stepladder.  Make sure that both sides of the stepladder are locked into place.  Ask someone to hold it for you, if possible.  Clearly mark and make sure that you can see:  Any grab bars or handrails.  First and last steps.  Where the edge of each step is.  Use tools that help you move around (mobility aids) if they are needed. These include:  Canes.  Walkers.  Scooters.  Crutches.  Turn on the lights when you go into a dark area. Replace any light bulbs as soon as they burn out.  Set up your furniture so you have a clear path. Avoid moving your furniture around.  If any of your floors are uneven, fix them.  If there are any pets around you, be aware of where they are.  Review your medicines with your doctor. Some medicines can make you feel dizzy. This can increase your chance of falling. Ask your doctor what other things that you can do to help prevent falls. This information is not intended to replace advice given to you by your health care provider. Make sure you discuss any questions you have with your health care provider. Document Released: 04/27/2009 Document Revised: 12/07/2015 Document Reviewed: 08/05/2014 Elsevier Interactive Patient Education  2017 [employer].

## 2020-08-23 NOTE — Progress Notes (Signed)
Subjective:   Anthony Daugherty is a 75 y.o. male who presents for Medicare Annual/Subsequent preventive examination.  I connected with  Aundrey today by a video enabled telemedicine application and verified that I am speaking with the correct person using two identifiers.  Location of patient:Home Location of provider:Work  Persons participating in the virtual visit: patient, nurse.   I discussed the limitations, risk, security and privacy concerns of evaluation and management by telemedicine. The patient expressed understanding and agreed to proceed.  Some vital signs may be absent or patient reported.   Review of Systems     Cardiac Risk Factors include: advanced age (>6men, >78 women);hypertension;dyslipidemia;sedentary lifestyle     Objective:    Today's Vitals   08/23/20 1500  Weight: 169 lb (76.7 kg)  Height: 5' 5.5" (1.664 m)   Body mass index is 27.7 kg/m.  Advanced Directives 08/23/2020 11/15/2019 06/01/2019 01/08/2019 12/24/2018 08/01/2017  Does Patient Have a Medical Advance Directive? Yes Yes Yes Yes Yes Yes  Type of Advance Directive Living will;Healthcare Power of [place];Living will Healthcare Power of [place];Living will [place];Living will [place];Living will  Does patient want to make changes to medical advance directive? - - No - Patient declined No - Patient declined - -  Copy of [place] in Chart? Yes - validated most recent copy scanned in chart (See row information) - No - copy requested No - copy requested - Yes    Current Medications (verified) Outpatient Encounter Medications as of 08/23/2020  Medication Sig  . acetaminophen (TYLENOL) 500 MG tablet Take 500-1,000 mg by mouth every 6 (six) hours as needed (for pain.).  Marland Kitchen atorvastatin (LIPITOR) 40 MG tablet TAKE 1 TABLET DAILY  . carvedilol (COREG) 25 MG tablet TAKE  (1)  TABLET  TWICE A DAY.  Marland Kitchen colchicine 0.6 MG tablet TAKE 2 TABLETS AT ONSET OF GOUT PAIN, REPEAT IN 1 HOUR, THEN 1 TABLETTWICE/DAY TIL RESOLVED  . ENTRESTO 49-51 MG TAKE  (1)  TABLET TWICE A DAY.  Marland Kitchen FLUoxetine (PROZAC) 20 MG capsule Take 20 mg by mouth daily.   Marland Kitchen spironolactone (ALDACTONE) 25 MG tablet TAKE (1/2) TABLET DAILY.   No facility-administered encounter medications on file as of 08/23/2020.    Allergies (verified) Penicillins   History: Past Medical History:  Diagnosis Date  . Aortic regurgitation    Mild-mod on echo 07/2017.  Moderate 08/2018.  Moderate-severe on echo 04/2019--> TEE 11/15/19 EF 50-55%, no LV dilation or WMA. Mod AV sclerosis w/out stenosis, mod-to-sev AI-.>rpt echo 6 mo  . Aortic root dilatation ([place]) 11/17/2019   11/17/19 45 mm aortic root at the sinuses of Valsalva->Dr. Radford Pax referred pt to CT surgeon.  . Aortic stenosis, mild   . Arthritis    L-Spine  . CARDIAC MURMUR 32/35/5732; 2025   Systolic and diastolic as of 10/2704.  . Cardiomyopathy ([place]) 10/12/2010   Global hypokinesis, EF 40-45% by cath and echo (2012 echo and 2019 echo).  Feb 2020 EF 20-25%. - mixed ICM/NICM (alcohol and CAD)  October 2020 EF 25-30%.  . Chronic combined systolic and diastolic CHF (congestive heart failure) (HCC)    max med mgmt, plus CRT-D placed 05/2019, EF improved to 50% after.  . Coronary artery disease 11/16/2010   a. 2012 - 80% OM lesion. b. Cath 12/2018 - showing 95% prox RCA, 75% OM1, 50% prox-mid LAD. Unsuccessful PCI to 100% chronically occluded RCA summer 2020.  Marland Kitchen  Dilated aortic root ([place]) 10/21/2018   87mm at sinuses of Valsalva  . Diverticulosis   . Erectile dysfunction 08/16/2011  . ESSENTIAL HYPERTENSION 07/19/2010   Qualifier: Diagnosis of  By: Anitra Lauth M.D., Brien Few   . GAD (generalized anxiety disorder)    managed by Dr. Toy Care with prozac 20mg  qd  . Gout    Usually Right great toe; colchicine helps well  . Habitual alcohol use   . Hx of colonoscopy 2004; 2014   Initial  was normal; 2014-polypectomy and diverticulosis.  Recall 2019.  Marland Kitchen Hyperlipidemia 10/23/2012  . LBBB (left bundle branch block)    CRT-D placed 05/2019  . Low back strain 10/24/2013  . Obesity, Class I, BMI 30-34.9   . Thyroid nodule 04/23/2013  . TOBACCO ABUSE 08/13/2010   Past Surgical History:  Procedure Laterality Date  . BIV ICD INSERTION CRT-D N/A 06/01/2019   Procedure: BIV ICD INSERTION CRT-D;  Surgeon: [name], MD;  Location: [facility];  Service: Cardiovascular;  Laterality: N/A;  . CARDIAC CATHETERIZATION  10/12/10   diagnostic only: 1 vessel CAD with TIMI 3 flow, EF 40-45%.  Mild AS.  Medical mgmt of CAD.  Marland Kitchen COLONOSCOPY    . COLONOSCOPY W/ POLYPECTOMY  10/06/12   Diverticulosis and tubular adenoma w/out high grade dysplasia.  Repeat 5 yrs (Dr. Deatra Ina).  . CORONARY ATHERECTOMY N/A 01/08/2019   100% chronic RCA occlusion impossible to clear.  Med mgmt. Procedure: CORONARY ATHERECTOMY;  Surgeon: Sherren Mocha, MD;  Location: [facility];  Service: Cardiovascular;  Laterality: N/A;  . POLYPECTOMY    . RIGHT HEART CATH AND CORONARY ANGIOGRAPHY N/A 12/24/2018   95% RCA occlusion, 75% obtuse marginal occlusion (3V dz). Procedure: RIGHT HEART CATH AND CORONARY ANGIOGRAPHY;  Surgeon: Lorretta Harp, MD;  Location: [facility];  Service: Cardiovascular;  Laterality: N/A;  . TEE WITHOUT CARDIOVERSION N/A 11/15/2019   EF 50-55%, no LV dilation or WMA. Mod AV sclerosis w/out stenosis, mod-to-sev AI.  Procedure: TRANSESOPHAGEAL ECHOCARDIOGRAM (TEE);  Surgeon: Fay Records, MD;  Location: [facility];  Service: Cardiovascular;  Laterality: N/A;  . TRANSTHORACIC ECHOCARDIOGRAM  09/2010; 07/2017; 08/2018; 10/2019;05/2020   2012:  EF 40-45%, global hypokinesis, mild AS.  07/2017: no interval chg except mild/mod AR.  08/2018 EF 20-25%, global LV hypok, DD, biatrial enlargemt, mod AR, aortic root 3.8 cm. 10/2019 EF 50%, mild dec RV fxn, mod-sev AR, aor root dil 42 mm. 05/2020 EF  45-50%,grd I DD, mod AI, aorta 47mm, severe LAE.   Family History  Problem Relation Age of Onset  . Congenital heart disease Brother        cardiomyopathy  . Alzheimer's disease Mother   . COPD Father   . Heart failure Father        congestive  . Colon cancer Neg Hx   . Esophageal cancer Neg Hx   . Stomach cancer Neg Hx   . Rectal cancer Neg Hx   . Colon polyps Neg Hx    Social History   Socioeconomic History  . Marital status: Married    Spouse name: Thayer Headings  . Number of children: 2  . Years of education: Not on file  . Highest education level: Not on file  Occupational History  . Occupation: Engineer, structural: [employer]  Tobacco Use  . Smoking status: Former Research scientist (life sciences)  . Smokeless tobacco: Current User    Types: Chew  . Tobacco comment: chews tobacco  Vaping Use  .  Vaping Use: Never used  Substance and Sexual Activity  . Alcohol use: Yes    Alcohol/week: 28.0 standard drinks    Types: 28 Cans of beer per week    Comment: daily  . Drug use: No  . Sexual activity: Yes  Other Topics Concern  . Not on file  Social History Narrative   Delivery driver.  Married, two children.   Long hx of smoking but quit after 2010.      Lives in [place].   Retired delivery person for a [employer]   Social Determinants of Radio broadcast assistant Strain: [place]   . Difficulty of Paying Living Expenses: Not hard at all  Food Insecurity: No Food Insecurity  . Worried About Charity fundraiser in the Last Year: Never true  . Ran Out of Food in the Last Year: Never true  Transportation Needs: No Transportation Needs  . Lack of Transportation (Medical): No  . Lack of Transportation (Non-Medical): No  Physical Activity: Inactive  . Days of Exercise per Week: 0 days  . Minutes of Exercise per Session: 0 min  Stress: No Stress Concern Present  . Feeling of Stress : Not at all  Social Connections: Moderately Isolated  . Frequency of Communication with  Friends and Family: More than three times a week  . Frequency of Social Gatherings with Friends and Family: Never  . Attends Religious Services: Never  . Active Member of Clubs or Organizations: No  . Attends Archivist Meetings: Never  . Marital Status: Married    Tobacco Counseling Ready to quit: Not Answered Counseling given: Not Answered Comment: chews tobacco   Clinical Intake:  Pre-visit preparation completed: Yes  Pain : No/denies pain     Nutritional Status: BMI 25 -29 Overweight Nutritional Risks: None Diabetes: No  How often do you need to have someone help you when you read instructions, pamphlets, or other written materials from your doctor or pharmacy?: 1 - Never  Diabetic?No  Interpreter Needed?: No  Information entered by :: Caroleen Hamman LPN   Activities of Daily Living In your present state of health, do you have any difficulty performing the following activities: 08/23/2020  Hearing? N  Vision? N  Difficulty concentrating or making decisions? Y  Comment forgets names sometimes  Walking or climbing stairs? N  Dressing or bathing? N  Doing errands, shopping? N  Preparing Food and eating ? N  Using the Toilet? N  In the past six months, have you accidently leaked urine? N  Do you have problems with loss of bowel control? N  Managing your Medications? N  Managing your Finances? N  Housekeeping or managing your Housekeeping? N  Some recent data might be hidden    Patient Care Team: Tammi Sou, MD as PCP - General Sueanne Margarita, MD as PCP - Cardiology (Cardiology) [name], MD as PCP - Electrophysiology (Cardiology) Inda Castle, MD (Inactive) as Consulting Physician (Gastroenterology) Chucky May, MD as Consulting Physician (Psychiatry) Marlyce Huge (Dentistry)  Indicate any recent Medical Services you may have received from other than [facility] providers in the past year (date may be approximate).     Assessment:    This is a routine wellness examination for [place].  Hearing/Vision screen  Hearing Screening   125Hz  250Hz  500Hz  1000Hz  2000Hz  3000Hz  4000Hz  6000Hz  8000Hz   Right ear:           Left ear:  Comments: No issues  Vision Screening Comments: Wears glasses Last eye exam-03/2020-Dr. Truman Hayward  Dietary issues and exercise activities discussed: Exercise limited by: None identified  Goals    . Increase physical activity     Increase activity by more yard work and walking.       Depression Screen PHQ 2/9 Scores 08/23/2020 09/21/2019 08/03/2018 01/30/2018 08/01/2017 08/01/2017 12/08/2015  PHQ - 2 Score 0 0 0 0 0 0 0  PHQ- 9 Score - - - 0 - - -    Fall Risk Fall Risk  08/23/2020 09/21/2019 08/03/2018 08/01/2017 08/01/2017  Falls in the past year? 0 0 0 No No  Number falls in past yr: 0 0 - - -  Injury with Fall? 0 0 - - -  Follow up Falls prevention discussed Falls evaluation completed - - -    FALL RISK PREVENTION PERTAINING TO THE HOME:  Any stairs in or around the home? Yes  If so, are there any without handrails? No  Home free of loose throw rugs in walkways, pet beds, electrical cords, etc? Yes  Adequate lighting in your home to reduce risk of falls? Yes   ASSISTIVE DEVICES UTILIZED TO PREVENT FALLS:  Life alert? No  Use of a cane, walker or w/c? No  Grab bars in the bathroom? No  Shower chair or bench in shower? No  Elevated toilet seat or a handicapped toilet? No   TIMED UP AND GO:  Was the test performed? No . Virtual visit   Cognitive Function:Normal cognitive status assessed by direct observation by this Nurse Health Advisor. No abnormalities found.   MMSE - Mini Mental State Exam 08/01/2017  Orientation to time 5  Orientation to Place 5  Registration 3  Attention/ Calculation 3  Recall 3  Language- name 2 objects 2  Language- repeat 1  Language- follow 3 step command 3  Language- read & follow direction 1  Write a sentence 1  Copy design 1  Total score 28         Immunizations Immunization History  Administered Date(s) Administered  . Fluad Quad(high Dose 65+) 03/23/2019  . Influenza,inj,Quad PF,6+ Mos 04/23/2013  . Influenza-Unspecified 04/21/2015, 04/11/2017, 05/01/2018, 04/17/2020  . Moderna Sars-Covid-2 Vaccination 08/16/2019, 09/14/2019, 05/16/2020  . Pneumococcal Conjugate-13 07/22/2014  . Pneumococcal Polysaccharide-23 08/16/2011, 12/13/2016  . Td 07/16/2007  . Tdap 08/26/2017  . Zoster Recombinat (Shingrix) 02/25/2017, 04/11/2017    TDAP status: Up to date  Flu Vaccine status: Up to date  Pneumococcal vaccine status: Up to date  Covid-19 vaccine status: Completed vaccines  Qualifies for Shingles Vaccine? No   Zostavax completed No   Shingrix Completed?: Yes  Screening Tests Health Maintenance  Topic Date Due  . COLONOSCOPY (Pts 45-79yrs Insurance coverage will need to be confirmed)  10/09/2017  . TETANUS/TDAP  08/27/2027  . INFLUENZA VACCINE  Completed  . COVID-19 Vaccine  Completed  . Hepatitis C Screening  Completed  . PNA vac Low Risk Adult  Completed    Health Maintenance  Health Maintenance Due  Topic Date Due  . COLONOSCOPY (Pts 45-28yrs Insurance coverage will need to be confirmed)  10/09/2017    Colorectal cancer screening: Due-Patient states he went to have Colonoscopy done but the GI doctor would not do it because of his heart problems.  Lung Cancer Screening: (Low Dose CT Chest recommended if Age 28-80 years, 30 pack-year currently smoking OR have quit w/in 15years.) does not qualify.     Additional Screening:  Hepatitis C Screening:  Completed 03/18/2014  Vision Screening: Recommended annual ophthalmology exams for early detection of glaucoma and other disorders of the eye. Is the patient up to date with their annual eye exam?  Yes  Who is the provider or what is the name of the office in which the patient attends annual eye exams? Dr. Truman Hayward   Dental Screening: Recommended annual dental exams for  proper oral hygiene  Community Resource Referral / Chronic Care Management: CRR required this visit?  No   CCM required this visit?  No      Plan:     I have personally reviewed and noted the following in the patient's chart:   . Medical and social history . Use of alcohol, tobacco or illicit drugs  . Current medications and supplements . Functional ability and status . Nutritional status . Physical activity . Advanced directives . List of other physicians . Hospitalizations, surgeries, and ER visits in previous 12 months . Vitals . Screenings to include cognitive, depression, and falls . Referrals and appointments  In addition, I have reviewed and discussed with patient certain preventive protocols, quality metrics, and best practice recommendations. A written personalized care plan for preventive services as well as general preventive health recommendations were provided to patient.   Due to this being a virtual visit, the after visit summary with patients personalized plan was offered to patient via mail or my-chart.  Patient would like to access on my-chart.   Marta Antu, LPN   01/14/2201  Nurse Health Advisor  Nurse Notes: None

## 2020-08-28 ENCOUNTER — Ambulatory Visit (INDEPENDENT_AMBULATORY_CARE_PROVIDER_SITE_OTHER): Payer: Medicare PPO

## 2020-08-28 DIAGNOSIS — I5022 Chronic systolic (congestive) heart failure: Secondary | ICD-10-CM

## 2020-08-28 DIAGNOSIS — Z95810 Presence of automatic (implantable) cardiac defibrillator: Secondary | ICD-10-CM | POA: Diagnosis not present

## 2020-08-31 ENCOUNTER — Ambulatory Visit (INDEPENDENT_AMBULATORY_CARE_PROVIDER_SITE_OTHER): Payer: Medicare PPO

## 2020-08-31 DIAGNOSIS — I447 Left bundle-branch block, unspecified: Secondary | ICD-10-CM | POA: Diagnosis not present

## 2020-09-01 NOTE — Progress Notes (Signed)
EPIC Encounter for ICM Monitoring  Patient Name: Anthony Daugherty is a 75 y.o. male Date: 09/01/2020 Primary Care Physican: Tammi Sou, MD Primary Cardiologist:Turner Electrophysiologist:Allred Bi-V Pacing:98% 07/19/2020 Weight:175lbs    Transmission reviewed.  CorVue thoracic impedancenormal.  Not prescribed a diuretic.  Labs: 03/31/2020 Creatinine 0.89, BUN 17, Potassium 4.0, Sodium 140, GFR 83.65 11/12/2019 Creatinine 0.92, BUN 14, Potassium 4.2, Sodium 139, GFR 81-94 09/21/2019 Creatinine 0.92, BUN 15, Potassium 4.1, Sodium 136, GFR 80.63  Recommendations:No changes  Follow-up plan: ICM clinic phone appointment on3/28/2022. 91 day device clinic remote transmission5/19/2022.  EP/Cardiology Office Visits:Recalls 05/03/2020 with Dr.Turner and 05/15/2020 with Dr Rayann Heman.   Copy of ICM check sent to Dr.Allred.  3 month ICM trend: 09/01/2020.    1 Year ICM trend:       Rosalene Billings, RN 09/01/2020 4:26 PM

## 2020-09-02 LAB — CUP PACEART REMOTE DEVICE CHECK
Battery Remaining Longevity: 76 mo
Battery Remaining Percentage: 84 %
Battery Voltage: 2.96 V
Brady Statistic AP VP Percent: 33 %
Brady Statistic AP VS Percent: 1 %
Brady Statistic AS VP Percent: 66 %
Brady Statistic AS VS Percent: 1 %
Brady Statistic RA Percent Paced: 31 %
Date Time Interrogation Session: 20220218125440
HighPow Impedance: 70 Ohm
Implantable Lead Implant Date: 20201117
Implantable Lead Implant Date: 20201117
Implantable Lead Implant Date: 20201117
Implantable Lead Location: 753858
Implantable Lead Location: 753859
Implantable Lead Location: 753860
Implantable Pulse Generator Implant Date: 20201117
Lead Channel Impedance Value: 390 Ohm
Lead Channel Impedance Value: 390 Ohm
Lead Channel Impedance Value: 740 Ohm
Lead Channel Pacing Threshold Amplitude: 0.625 V
Lead Channel Pacing Threshold Amplitude: 0.75 V
Lead Channel Pacing Threshold Amplitude: 1.25 V
Lead Channel Pacing Threshold Pulse Width: 0.5 ms
Lead Channel Pacing Threshold Pulse Width: 0.5 ms
Lead Channel Pacing Threshold Pulse Width: 0.5 ms
Lead Channel Sensing Intrinsic Amplitude: 1.6 mV
Lead Channel Sensing Intrinsic Amplitude: 11.8 mV
Lead Channel Setting Pacing Amplitude: 1.625
Lead Channel Setting Pacing Amplitude: 1.75 V
Lead Channel Setting Pacing Amplitude: 2 V
Lead Channel Setting Pacing Pulse Width: 0.5 ms
Lead Channel Setting Pacing Pulse Width: 0.5 ms
Lead Channel Setting Sensing Sensitivity: 0.5 mV
Pulse Gen Serial Number: 111012743

## 2020-09-06 NOTE — Progress Notes (Signed)
Remote ICD transmission.   

## 2020-09-29 ENCOUNTER — Encounter: Payer: Self-pay | Admitting: Family Medicine

## 2020-09-29 ENCOUNTER — Ambulatory Visit (INDEPENDENT_AMBULATORY_CARE_PROVIDER_SITE_OTHER): Payer: Medicare PPO | Admitting: Family Medicine

## 2020-09-29 ENCOUNTER — Other Ambulatory Visit: Payer: Self-pay

## 2020-09-29 VITALS — BP 130/68 | HR 63 | Temp 97.8°F | Resp 16 | Ht 64.5 in | Wt 172.8 lb

## 2020-09-29 DIAGNOSIS — I2510 Atherosclerotic heart disease of native coronary artery without angina pectoris: Secondary | ICD-10-CM | POA: Diagnosis not present

## 2020-09-29 DIAGNOSIS — I10 Essential (primary) hypertension: Secondary | ICD-10-CM | POA: Diagnosis not present

## 2020-09-29 DIAGNOSIS — Z0000 Encounter for general adult medical examination without abnormal findings: Secondary | ICD-10-CM | POA: Diagnosis not present

## 2020-09-29 DIAGNOSIS — R911 Solitary pulmonary nodule: Secondary | ICD-10-CM

## 2020-09-29 DIAGNOSIS — E7800 Pure hypercholesterolemia, unspecified: Secondary | ICD-10-CM

## 2020-09-29 DIAGNOSIS — F1010 Alcohol abuse, uncomplicated: Secondary | ICD-10-CM

## 2020-09-29 LAB — CBC WITH DIFFERENTIAL/PLATELET
Basophils Absolute: 0 10*3/uL (ref 0.0–0.1)
Basophils Relative: 0.8 % (ref 0.0–3.0)
Eosinophils Absolute: 0.2 10*3/uL (ref 0.0–0.7)
Eosinophils Relative: 5 % (ref 0.0–5.0)
HCT: 39.7 % (ref 39.0–52.0)
Hemoglobin: 13.7 g/dL (ref 13.0–17.0)
Lymphocytes Relative: 24.1 % (ref 12.0–46.0)
Lymphs Abs: 1 10*3/uL (ref 0.7–4.0)
MCHC: 34.4 g/dL (ref 30.0–36.0)
MCV: 93.2 fl (ref 78.0–100.0)
Monocytes Absolute: 0.3 10*3/uL (ref 0.1–1.0)
Monocytes Relative: 7.5 % (ref 3.0–12.0)
Neutro Abs: 2.6 10*3/uL (ref 1.4–7.7)
Neutrophils Relative %: 62.6 % (ref 43.0–77.0)
Platelets: 198 10*3/uL (ref 150.0–400.0)
RBC: 4.26 Mil/uL (ref 4.22–5.81)
RDW: 14.1 % (ref 11.5–15.5)
WBC: 4.2 10*3/uL (ref 4.0–10.5)

## 2020-09-29 LAB — COMPREHENSIVE METABOLIC PANEL
ALT: 18 U/L (ref 0–53)
AST: 20 U/L (ref 0–37)
Albumin: 4.5 g/dL (ref 3.5–5.2)
Alkaline Phosphatase: 62 U/L (ref 39–117)
BUN: 16 mg/dL (ref 6–23)
CO2: 27 mEq/L (ref 19–32)
Calcium: 9.3 mg/dL (ref 8.4–10.5)
Chloride: 106 mEq/L (ref 96–112)
Creatinine, Ser: 1.02 mg/dL (ref 0.40–1.50)
GFR: 72.62 mL/min (ref >60.00)
Glucose, Bld: 84 mg/dL (ref 70–99)
Potassium: 4.2 mEq/L (ref 3.5–5.1)
Sodium: 142 mEq/L (ref 135–145)
Total Bilirubin: 1.1 mg/dL (ref 0.2–1.2)
Total Protein: 6.7 g/dL (ref 6.0–8.3)

## 2020-09-29 LAB — LIPID PANEL
Cholesterol: 142 mg/dL (ref 0–200)
HDL: 72.7 mg/dL (ref >39.00)
LDL Cholesterol: 62 mg/dL (ref 0–99)
NonHDL: 69.79
Total CHOL/HDL Ratio: 2
Triglycerides: 41 mg/dL (ref 0.0–149.0)
VLDL: 8.2 mg/dL (ref 0.0–40.0)

## 2020-09-29 NOTE — Progress Notes (Signed)
Office Note 09/29/2020  CC:  Chief Complaint  Patient presents with  . Annual Exam    Pt is fasting   HPI:  Anthony Daugherty is a 75 y.o. White male who is here for annual health maintenance exam and for 6 mo f/u mild cognitive impairment with hx of alc abuse, HTN, HLD. A/P as of last visit: "1) HTN: The current medical regimen is effective;  continue present plan and medications. BMET today.  2) HLD: tolerating statin.  FLP today.  3) Alcohol abuse/alcoholism: he absolutely refuses to even consider quitting drinking.  4) CAD, cardiomyopathy, LBBB (now with good EF s/p max med mgmt, CRT-D). He is completely asymptomatic, without any physical limitations. Will continue to investigate why he is not on any antiplatelet med-->suspect this may be due to his habitual alcohol abuse and risk of falling.  5) Mod/sev AR, with mildly dilated aortic root and ascending aorta---per CV surgery-->no surgery recommended at this time.  6) Colonoscopy: reviewed EMR, 11/08/19 colonoscopy aborted prior to starting b/c of low bp initially and then discussion of some upcoming TEE as part of eval to see if any surgery needed.  Dr. Havery Moros recommended pt call back to reschedule after cardiac issues have been addressed."  INTERIM HX: Doing fine. Going upstairs to his room and no sob or cp, walks to his shop 75 ft up slope and no cp or sob. Couple beers a day alcohol intake.  Home bp monitoring consistently 120s/60s.   Compliant with all meds except doesn't take ASA much b/c says it bothers his stomach too much.  Hx of 0.5 cm subpleural pulm nodule in RLL on CT 11/2019.  He tells me he has never been cig smoker.  Occ smoked a cigar when much younger.  Past Medical History:  Diagnosis Date  . Aortic regurgitation    Mild-mod on echo 07/2017.  Moderate 08/2018.  Moderate-severe on echo 04/2019--> TEE 11/15/19 EF 50-55%, no LV dilation or WMA. Mod AV sclerosis w/out stenosis, mod-to-sev AI-.>rpt  echo 6 mo  . Aortic root dilatation ([place]) 11/17/2019   11/17/19 45 mm aortic root at the sinuses of Valsalva->Dr. Radford Pax referred pt to CT surgeon.  . Aortic stenosis, mild   . Arthritis    L-Spine  . CARDIAC MURMUR 07/23/3233; 5732   Systolic and diastolic as of 08/252.  . Cardiomyopathy ([place]) 10/12/2010   Global hypokinesis, EF 40-45% by cath and echo (2012 echo and 2019 echo).  Feb 2020 EF 20-25%. - mixed ICM/NICM (alcohol and CAD)  October 2020 EF 25-30%.  . Chronic combined systolic and diastolic CHF (congestive heart failure) (HCC)    max med mgmt, plus CRT-D placed 05/2019, EF improved to 50% after.  . Coronary artery disease 11/16/2010   a. 2012 - 80% OM lesion. b. Cath 12/2018 - showing 95% prox RCA, 75% OM1, 50% prox-mid LAD. Unsuccessful PCI to 100% chronically occluded RCA summer 2020.  . Dilated aortic root ([place]) 10/21/2018   7mm at sinuses of Valsalva  . Diverticulosis   . Erectile dysfunction 08/16/2011  . ESSENTIAL HYPERTENSION 07/19/2010   Qualifier: Diagnosis of  By: Anitra Lauth M.D., Brien Few   . GAD (generalized anxiety disorder)    managed by Dr. Toy Care with prozac 20mg  qd  . Gout    Usually Right great toe; colchicine helps well  . Habitual alcohol use   . Hx of colonoscopy 2004; 2014   Initial was normal; 2014-polypectomy and diverticulosis.  Recall 2019.  Marland Kitchen Hyperlipidemia 10/23/2012  .  LBBB (left bundle branch block)    CRT-D placed 05/2019  . Low back strain 10/24/2013  . Obesity, Class I, BMI 30-34.9   . Thyroid nodule 04/23/2013  . TOBACCO ABUSE 08/13/2010    Past Surgical History:  Procedure Laterality Date  . BIV ICD INSERTION CRT-D N/A 06/01/2019   Procedure: BIV ICD INSERTION CRT-D;  Surgeon: [name], MD;  Location: [facility];  Service: Cardiovascular;  Laterality: N/A;  . CARDIAC CATHETERIZATION  10/12/10   diagnostic only: 1 vessel CAD with TIMI 3 flow, EF 40-45%.  Mild AS.  Medical mgmt of CAD.  Marland Kitchen COLONOSCOPY    . COLONOSCOPY W/  POLYPECTOMY  10/06/12   Diverticulosis and tubular adenoma w/out high grade dysplasia.  Repeat 5 yrs (Dr. Deatra Ina).  . CORONARY ATHERECTOMY N/A 01/08/2019   100% chronic RCA occlusion impossible to clear.  Med mgmt. Procedure: CORONARY ATHERECTOMY;  Surgeon: Sherren Mocha, MD;  Location: [facility];  Service: Cardiovascular;  Laterality: N/A;  . POLYPECTOMY    . RIGHT HEART CATH AND CORONARY ANGIOGRAPHY N/A 12/24/2018   95% RCA occlusion, 75% obtuse marginal occlusion (3V dz). Procedure: RIGHT HEART CATH AND CORONARY ANGIOGRAPHY;  Surgeon: Lorretta Harp, MD;  Location: [facility];  Service: Cardiovascular;  Laterality: N/A;  . TEE WITHOUT CARDIOVERSION N/A 11/15/2019   EF 50-55%, no LV dilation or WMA. Mod AV sclerosis w/out stenosis, mod-to-sev AI.  Procedure: TRANSESOPHAGEAL ECHOCARDIOGRAM (TEE);  Surgeon: Fay Records, MD;  [facility];  Service: Cardiovascular;  Laterality: N/A;  . TRANSTHORACIC ECHOCARDIOGRAM  09/2010; 07/2017; 08/2018; 10/2019;05/2020   2012:  EF 40-45%, global hypokinesis, mild AS.  07/2017: no interval chg except mild/mod AR.  08/2018 EF 20-25%, global LV hypok, DD, biatrial enlargemt, mod AR, aortic root 3.8 cm. 10/2019 EF 50%, mild dec RV fxn, mod-sev AR, aor root dil 42 mm. 05/2020 EF 45-50%,grd I DD, mod AI, aorta 72mm, severe LAE.    Family History  Problem Relation Age of Onset  . Congenital heart disease Brother        cardiomyopathy  . Alzheimer's disease Mother   . COPD Father   . Heart failure Father        congestive  . Colon cancer Neg Hx   . Esophageal cancer Neg Hx   . Stomach cancer Neg Hx   . Rectal cancer Neg Hx   . Colon polyps Neg Hx     Social History   Socioeconomic History  . Marital status: Married    Spouse name: Thayer Headings  . Number of children: 2  . Years of education: Not on file  . Highest education level: Not on file  Occupational History  . Occupation: Engineer, structural: [employer]   Tobacco Use  . Smoking status: Former Research scientist (life sciences)  . Smokeless tobacco: Current User    Types: Chew  . Tobacco comment: chews tobacco  Vaping Use  . Vaping Use: Never used  Substance and Sexual Activity  . Alcohol use: Yes    Alcohol/week: 28.0 standard drinks    Types: 28 Cans of beer per week    Comment: daily  . Drug use: No  . Sexual activity: Yes  Other Topics Concern  . Not on file  Social History Narrative   Delivery driver.  Married, two children.   Long hx of smoking but quit after 2010.      Lives in [place].   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 Difficulty of Paying Living Expenses: Not hard at all  Food Insecurity: No Food Insecurity  . Worried About Charity fundraiser in the Last Year: Never true  . Ran Out of Food in the Last Year: Never true  Transportation Needs: No Transportation Needs  . Lack of Transportation (Medical): No  . Lack of Transportation (Non-Medical): No  Physical Activity: Inactive  . Days of Exercise per Week: 0 days  . Minutes of Exercise per Session: 0 min  Stress: No Stress Concern Present  . Feeling of Stress : Not at all  Social Connections: Moderately Isolated  . Frequency of Communication with Friends and Family: More than three times a week  . Frequency of Social Gatherings with Friends and Family: Never  . Attends Religious Services: Never  . Active Member of Clubs or Organizations: No  . Attends Archivist Meetings: Never  . Marital Status: Married  Human resources officer Violence: Not At Risk  . Fear of Current or Ex-Partner: No  . Emotionally Abused: No  . Physically Abused: No  . Sexually Abused: No    Outpatient Medications Prior to Visit  Medication Sig Dispense Refill  . atorvastatin (LIPITOR) 40 MG tablet TAKE 1 TABLET DAILY 90 tablet 1  . carvedilol (COREG) 25 MG tablet TAKE  (1)  TABLET TWICE A DAY. 180 tablet 2  . colchicine  0.6 MG tablet TAKE 2 TABLETS AT ONSET OF GOUT PAIN, REPEAT IN 1 HOUR, THEN 1 TABLETTWICE/DAY TIL RESOLVED 30 tablet 1  . ENTRESTO 49-51 MG TAKE  (1)  TABLET TWICE A DAY. 180 tablet 3  . FLUoxetine (PROZAC) 20 MG capsule Take 20 mg by mouth daily.     Marland Kitchen spironolactone (ALDACTONE) 25 MG tablet TAKE (1/2) TABLET DAILY. 45 tablet 1  . acetaminophen (TYLENOL) 500 MG tablet Take 500-1,000 mg by mouth every 6 (six) hours as needed (for pain.). (Patient not taking: Reported on 09/29/2020)     No facility-administered medications prior to visit.    Allergies  Allergen Reactions  . Penicillins Swelling    Did it involve swelling of the face/tongue/throat, SOB, or low BP? No Did it involve sudden or severe rash/hives, skin peeling, or any reaction on the inside of your mouth or nose? No Did you need to seek medical attention at a hospital or doctor's office? No When did it last happen? ~10 years ago If all above answers are "NO", may proceed with cephalosporin use.     ROS Review of Systems  Constitutional: Negative for appetite change, chills, fatigue and fever.  HENT: Negative for congestion, dental problem, ear pain and sore throat.   Eyes: Negative for discharge, redness and visual disturbance.  Respiratory: Negative for cough, chest tightness, shortness of breath and wheezing.   Cardiovascular: Negative for chest pain, palpitations and leg swelling.  Gastrointestinal: Negative for abdominal pain, blood in stool, diarrhea, nausea and vomiting.  Genitourinary: Negative for difficulty urinating, dysuria, flank pain, frequency, hematuria and urgency.  Musculoskeletal: Negative for arthralgias, back pain, joint swelling, myalgias and neck stiffness.  Skin: Negative for pallor and rash.  Neurological: Negative for dizziness, speech difficulty, weakness and headaches.  Hematological: Negative for adenopathy. Does not bruise/bleed easily.  Psychiatric/Behavioral: Negative for confusion and sleep  disturbance. The patient is not nervous/anxious.     PE; Vitals with BMI 09/29/2020 08/23/2020 07/19/2020  Height 5' 4.5" 5' 5.5" 5\' 5"   Weight 172 lbs 13 oz 169 lbs 175 lbs  BMI 29.21 35.57 32.20  Systolic 254 - -  Diastolic 68 - -  Pulse 63 - -     Gen: Alert, well appearing.  Patient is oriented to person, place, time, and situation. AFFECT: pleasant, lucid thought and speech. ENT: Ears: EACs clear, normal epithelium.  TMs with good light reflex and landmarks bilaterally.  Eyes: no injection, icteris, swelling, or exudate.  EOMI, PERRLA. Nose: no drainage or turbinate edema/swelling.  No injection or focal lesion.  Mouth: lips without lesion/swelling.  Oral mucosa pink and moist.  Dentition intact and without obvious caries or gingival swelling.  Oropharynx without erythema, exudate, or swelling.  Neck: supple/nontender.  No LAD, mass, or TM.  Carotid pulses 2+ bilaterally, without bruits. CV: RRR, 1/6 syst and 1/6 diast murmurs audible in RUSB region, no r/g.   LUNGS: CTA bilat, nonlabored resps, good aeration in all lung fields. ABD: soft, NT, ND, BS normal.  No hepatospenomegaly or mass.  No bruits. EXT: no clubbing, cyanosis, or edema.  Musculoskeletal: no joint swelling, erythema, warmth, or tenderness.  ROM of all joints intact. Skin - no sores or suspicious lesions or rashes or color changes   Pertinent labs:  Lab Results  Component Value Date   TSH 2.350 12/21/2018   Lab Results  Component Value Date   WBC WILL FOLLOW 11/12/2019   WBC 3.5 11/12/2019   HGB WILL FOLLOW 11/12/2019   HGB 13.6 11/12/2019   HCT WILL FOLLOW 11/12/2019   HCT 39.0 11/12/2019   MCV WILL FOLLOW 11/12/2019   MCV 93 11/12/2019   PLT WILL FOLLOW 11/12/2019   PLT 189 11/12/2019   Lab Results  Component Value Date   CREATININE 0.89 03/31/2020   BUN 17 03/31/2020   NA 140 03/31/2020   K 4.0 03/31/2020   CL 104 03/31/2020   CO2 28 03/31/2020   Lab Results  Component Value Date   ALT 11  09/21/2019   AST 12 09/21/2019   ALKPHOS 73 09/21/2019   BILITOT 0.7 09/21/2019   Lab Results  Component Value Date   CHOL 157 03/31/2020   Lab Results  Component Value Date   HDL 74.50 03/31/2020   Lab Results  Component Value Date   LDLCALC 73 03/31/2020   Lab Results  Component Value Date   TRIG 51.0 03/31/2020   Lab Results  Component Value Date   CHOLHDL 2 03/31/2020   Lab Results  Component Value Date   PSA 2.54 08/01/2017   PSA 1.42 12/08/2015   PSA 1.59 07/22/2014    ASSESSMENT AND PLAN:   1) HTN: stable.  Cont carvedolol, entresto, aldactone. Lytes/cr today.   2) HLD: tolerating atorva 40mg  qd. FLP and hepatic panel today.  3) Alc abuse: pt admits to 2 beers a day lately.  Encouraged abstinence.  4) Dilated CM, AR, aortic root dilatation:  Asymptomatic.  No sign of volume overload. EF 40-45% on echo 05/2020. Followed closely by cardiologist. Cont BB, entresto, spirono, +CRT-D.  5) Health maintenance exam: Reviewed age and gender appropriate health maintenance issues (prudent diet, regular exercise, health risks of tobacco and excessive alcohol, use of seatbelts, fire alarms in home, use of sunscreen).  Also reviewed age and gender appropriate health screening as well as vaccine recommendations. Vaccines: ALL UTD. Labs: cmet, flp, cbc Prostate ca screening: shared decision making process utilized today->no further screening indicated due to age. Colon ca screening: adenoma 2014, is overdue for rpt colonoscopy-->pt to  contact GI to arrange.  6) Pulm nodule 11/2019: no repeat CT needed b/c pt low risk (NEVER SMOKER).  An After Visit Summary was printed and given to the patient.  FOLLOW UP:  Return in about 6 months (around 04/01/2021) for routine chronic illness f/u.  Signed:  Crissie Sickles, MD           09/29/2020

## 2020-10-09 ENCOUNTER — Ambulatory Visit (INDEPENDENT_AMBULATORY_CARE_PROVIDER_SITE_OTHER): Payer: Medicare PPO

## 2020-10-09 DIAGNOSIS — I5022 Chronic systolic (congestive) heart failure: Secondary | ICD-10-CM | POA: Diagnosis not present

## 2020-10-09 DIAGNOSIS — Z95810 Presence of automatic (implantable) cardiac defibrillator: Secondary | ICD-10-CM | POA: Diagnosis not present

## 2020-10-10 ENCOUNTER — Telehealth: Payer: Self-pay

## 2020-10-10 NOTE — Progress Notes (Signed)
EPIC Encounter for ICM Monitoring  Patient Name: Anthony Daugherty is a 75 y.o. male Date: 10/10/2020 Primary Care Physican: Tammi Sou, MD Primary Cardiologist:Turner Electrophysiologist:Allred Bi-V Pacing:98% 07/19/2020 Weight:175lbs    Attempted call to wife/patient and unable to reach.   Transmission reviewed.   CorVue thoracic impedancenormal.  Prescribed: Spironolactone 25 mg take 0.5 tablet daily.  Labs: 09/29/2020 Creatinine 1.02, BUN 16, Potassium 4.2, Sodium 142, GFR 72.62 A complete set of results can be found in Results Review.  Recommendations:Unable to reach.    Follow-up plan: ICM clinic phone appointment on5/08/2020. 91 day device clinic remote transmission5/19/2022.  EP/Cardiology Office Visits:Recalls 05/03/2020 with Dr.Turner and 05/15/2020 with Dr Rayann Heman.   Copy of ICM check sent to Dr.Allred.  3 month ICM trend: 10/09/2020.    1 Year ICM trend:       Rosalene Billings, RN 10/10/2020 9:10 AM

## 2020-10-10 NOTE — Telephone Encounter (Signed)
Remote ICM transmission received.  Attempted call to patient regarding ICM remote transmission and no answer.  

## 2020-11-07 ENCOUNTER — Other Ambulatory Visit: Payer: Self-pay

## 2020-11-07 MED ORDER — COLCHICINE 0.6 MG PO TABS: ORAL_TABLET | ORAL | 1 refills | Status: DC

## 2020-11-13 ENCOUNTER — Telehealth: Payer: Self-pay

## 2020-11-13 ENCOUNTER — Ambulatory Visit (INDEPENDENT_AMBULATORY_CARE_PROVIDER_SITE_OTHER): Payer: Medicare PPO

## 2020-11-13 DIAGNOSIS — Z95810 Presence of automatic (implantable) cardiac defibrillator: Secondary | ICD-10-CM

## 2020-11-13 DIAGNOSIS — I5022 Chronic systolic (congestive) heart failure: Secondary | ICD-10-CM | POA: Diagnosis not present

## 2020-11-13 NOTE — Telephone Encounter (Signed)
Remote ICM transmission received.  Attempted call to wife/patient regarding ICM remote transmission and left detailed message per DPR.  Advised to return call for any fluid symptoms or questions. Next ICM remote transmission scheduled 12/25/2020.

## 2020-11-13 NOTE — Progress Notes (Signed)
EPIC Encounter for ICM Monitoring  Patient Name: Anthony Daugherty is a 75 y.o. male Date: 11/13/2020 Primary Care Physican: Tammi Sou, MD Primary Cardiologist:Turner Electrophysiologist:Allred Bi-V Pacing:98% 1/5/2022Weight:175lbs    Attempted call to wife/patient and unable to reach.   Transmission reviewed.   CorVue thoracic impedancenormal.  Prescribed: Spironolactone 25 mg take 0.5 tablet daily.  Labs: 09/29/2020 Creatinine 1.02, BUN 16, Potassium 4.2, Sodium 142, GFR 72.62 A complete set of results can be found in Results Review.  Recommendations:Unable to reach.    Follow-up plan: ICM clinic phone appointment on6/13/2022. 91 day device clinic remote transmission5/19/2022.  EP/Cardiology Office Visits:Recalls 05/03/2020 with Dr.Turner and 05/15/2020 with Dr Rayann Heman.   Copy of ICM check sent to Dr.Allred.  3 month ICM trend: 11/13/2020.    1 Year ICM trend:       Rosalene Billings, RN 11/13/2020 4:15 PM

## 2020-11-27 ENCOUNTER — Encounter: Payer: Self-pay | Admitting: Cardiology

## 2020-11-27 ENCOUNTER — Other Ambulatory Visit: Payer: Self-pay

## 2020-11-27 ENCOUNTER — Ambulatory Visit (HOSPITAL_COMMUNITY): Payer: Medicare PPO | Attending: Cardiology

## 2020-11-27 DIAGNOSIS — I77810 Thoracic aortic ectasia: Secondary | ICD-10-CM | POA: Diagnosis not present

## 2020-11-27 DIAGNOSIS — I420 Dilated cardiomyopathy: Secondary | ICD-10-CM | POA: Insufficient documentation

## 2020-11-27 DIAGNOSIS — I351 Nonrheumatic aortic (valve) insufficiency: Secondary | ICD-10-CM | POA: Diagnosis not present

## 2020-11-27 DIAGNOSIS — I712 Thoracic aortic aneurysm, without rupture, unspecified: Secondary | ICD-10-CM

## 2020-11-27 LAB — ECHOCARDIOGRAM COMPLETE
AR max vel: 1.46 cm2
AV Area VTI: 1.44 cm2
AV Area mean vel: 1.51 cm2
AV Mean grad: 8 mmHg
AV Peak grad: 17.1 mmHg
Ao pk vel: 2.07 m/s
Area-P 1/2: 2.95 cm2
S' Lateral: 4.2 cm

## 2020-11-29 ENCOUNTER — Telehealth: Payer: Self-pay

## 2020-11-29 DIAGNOSIS — I359 Nonrheumatic aortic valve disorder, unspecified: Secondary | ICD-10-CM

## 2020-11-29 DIAGNOSIS — I351 Nonrheumatic aortic (valve) insufficiency: Secondary | ICD-10-CM

## 2020-11-29 NOTE — Telephone Encounter (Signed)
The patient's has been notified of the result and verbalized understanding.  All questions (if any) were answered. Anthony Iba, RN 11/29/2020 5:08 PM  Referral has been placed for patient to see Dr. Burt Knack

## 2020-11-29 NOTE — Telephone Encounter (Signed)
-----   Message from Sueanne Margarita, MD sent at 11/27/2020  4:07 PM EDT ----- Echo showed mildly reduced LVF with EF 45-50% with mildly thickened heart muscle, mild LAE, moderate leakiness of AV and mildly dilated aortic root at 52mm.  Compared to prior study LV dimensions have increased further despite AI read out as only moderate>LVF has declined further.  I think he likely has hemodynamically significant AI.  He has a hx of LV dysfunction and EF was better on last echo.  I recommend referring to Dr. Burt Knack for further evaluation with right and left heart cath and then review by structural heart team.

## 2020-11-30 ENCOUNTER — Ambulatory Visit (INDEPENDENT_AMBULATORY_CARE_PROVIDER_SITE_OTHER): Payer: Medicare PPO

## 2020-11-30 DIAGNOSIS — I5022 Chronic systolic (congestive) heart failure: Secondary | ICD-10-CM

## 2020-11-30 DIAGNOSIS — I420 Dilated cardiomyopathy: Secondary | ICD-10-CM

## 2020-12-04 LAB — CUP PACEART REMOTE DEVICE CHECK
Battery Remaining Longevity: 77 mo
Battery Remaining Percentage: 81 %
Battery Voltage: 2.96 V
Brady Statistic AP VP Percent: 32 %
Brady Statistic AP VS Percent: 1 %
Brady Statistic AS VP Percent: 66 %
Brady Statistic AS VS Percent: 1 %
Brady Statistic RA Percent Paced: 30 %
Date Time Interrogation Session: 20220520120652
HighPow Impedance: 64 Ohm
Implantable Lead Implant Date: 20201117
Implantable Lead Implant Date: 20201117
Implantable Lead Implant Date: 20201117
Implantable Lead Location: 753858
Implantable Lead Location: 753859
Implantable Lead Location: 753860
Implantable Pulse Generator Implant Date: 20201117
Lead Channel Impedance Value: 380 Ohm
Lead Channel Impedance Value: 390 Ohm
Lead Channel Impedance Value: 710 Ohm
Lead Channel Pacing Threshold Amplitude: 0.625 V
Lead Channel Pacing Threshold Amplitude: 0.75 V
Lead Channel Pacing Threshold Amplitude: 1.25 V
Lead Channel Pacing Threshold Pulse Width: 0.5 ms
Lead Channel Pacing Threshold Pulse Width: 0.5 ms
Lead Channel Pacing Threshold Pulse Width: 0.5 ms
Lead Channel Sensing Intrinsic Amplitude: 1.8 mV
Lead Channel Sensing Intrinsic Amplitude: 11.8 mV
Lead Channel Setting Pacing Amplitude: 1.625
Lead Channel Setting Pacing Amplitude: 1.75 V
Lead Channel Setting Pacing Amplitude: 2 V
Lead Channel Setting Pacing Pulse Width: 0.5 ms
Lead Channel Setting Pacing Pulse Width: 0.5 ms
Lead Channel Setting Sensing Sensitivity: 0.5 mV
Pulse Gen Serial Number: 111012743

## 2020-12-05 ENCOUNTER — Institutional Professional Consult (permissible substitution): Payer: Medicare PPO | Admitting: Cardiovascular Disease

## 2020-12-19 ENCOUNTER — Encounter: Payer: Self-pay | Admitting: Cardiovascular Disease

## 2020-12-19 ENCOUNTER — Other Ambulatory Visit: Payer: Self-pay

## 2020-12-19 ENCOUNTER — Ambulatory Visit: Payer: Medicare PPO | Admitting: Cardiovascular Disease

## 2020-12-19 VITALS — BP 142/64 | HR 75 | Ht 64.5 in | Wt 164.0 lb

## 2020-12-19 DIAGNOSIS — I351 Nonrheumatic aortic (valve) insufficiency: Secondary | ICD-10-CM | POA: Diagnosis not present

## 2020-12-19 NOTE — Progress Notes (Signed)
Cardiology Office Note:    Date:  12/19/2020   ID:  Anthony Daugherty, DOB Feb 27, 1946, MRN [id number]  PCP:  Tammi Sou, MD   [facility] Providers Cardiologist:  Fransico Him, MD Electrophysiologist:  [name], MD     Referring MD: Tammi Sou, MD   Chief Complaint  Patient presents with  . Aortic Insuffiency    History of Present Illness:    Anthony Daugherty is a 75 y.o. male with a hx of hypertension, multivessel coronary artery disease, cardiomyopathy with chronic combined systolic and diastolic heart failure, left bundle branch block status post CRT-D in 2020, ascending aortic aneurysm, and moderate to severe aortic valve insufficiency, presents today for evaluation of aortic insufficiency.  He is referred by Dr. Radford Pax.  The patient is here with his wife today.  In review of his records, he was evaluated by Dr. Cyndia Bent 1 year ago for consideration of treatment options regarding his dilated aorta and aortic valve insufficiency.  Because he had stabilized on medical therapy, ongoing surveillance was recommended.  The patient's aortic dilatation is mild.  His aortic valve insufficiency is eccentric and has been graded in the moderate to severe range.  The patient is completely asymptomatic with his normal activities.  He is not engaged in any regular exercise.  However, he can do work in his shop more light physical work without problems.  He specifically denies chest pain, chest pressure, or shortness of breath.  He denies orthopnea, PND, leg swelling, lightheadedness, or syncope.  He recently had an echocardiogram demonstrated an LVEF of 40 to 45%.  This is slightly decreased from his echo 1 year ago when his LVEF was 50%.  He was felt to have moderate aortic insufficiency and possibly a higher degree of aortic insufficiency in the setting of an eccentric AI jet.  The patient's LV dimensions are unchanged with an LVEDD of 55 and an LVESD of 42.  Past Medical History:   Diagnosis Date  . Aortic regurgitation    Mild-mod on echo 07/2017.  Moderate 08/2018.  Moderate-severe on echo 04/2019--> TEE 11/15/19 EF 50-55%, no LV dilation or WMA. Mod AV sclerosis w/out stenosis, mod-to-sev AI-.>rpt echo 6 mo. 11/2020 moderate AI  . Aortic root dilatation ([place]) 10/2018   11/17/19 45 mm aortic root at the sinuses of Valsalva->Dr. Radford Pax referred pt to CT surgeon. 52022 echo->40 mm  . Aortic stenosis, mild   . Arthritis    L-Spine  . CARDIAC MURMUR 18/84/1660; 6301   Systolic and diastolic as of 12/107.  . Cardiomyopathy ([place]) 10/12/2010   Global hypokinesis, EF 40-45% by cath and echo (2012 echo and 2019 echo).  Feb 2020 EF 20-25%. - mixed ICM/NICM (alcohol and CAD)  October 2020 EF 25-30%. 11/2020 EF 40-45%.  . Chronic combined systolic and diastolic CHF (congestive heart failure) (HCC)    max med mgmt, plus CRT-D placed 05/2019, EF improved to 50% after.  . Coronary artery disease 11/16/2010   a. 2012 - 80% OM lesion. b. Cath 12/2018 - showing 95% prox RCA, 75% OM1, 50% prox-mid LAD. Unsuccessful PCI to 100% chronically occluded RCA summer 2020.  Marland Kitchen Diverticulosis   . Erectile dysfunction 08/16/2011  . ESSENTIAL HYPERTENSION 07/19/2010   Qualifier: Diagnosis of  By: Anitra Lauth M.D., Brien Few   . GAD (generalized anxiety disorder)    managed by Dr. Toy Care with prozac 20mg  qd  . Gout    Usually Right great toe; colchicine helps well  . Habitual alcohol use   .  Hx of colonoscopy 2004; 2014   Initial was normal; 2014-polypectomy and diverticulosis.  Recall 2019.  Marland Kitchen Hyperlipidemia 10/23/2012  . LBBB (left bundle branch block)    CRT-D placed 05/2019  . Low back strain 10/24/2013  . Obesity, Class I, BMI 30-34.9   . Pulmonary nodule    0.5 cm RLL subpleural nodule.  Pt NEVER SMOKER.  Low risk->no f/u imaging is needed.  . Thyroid nodule 04/23/2013  . TOBACCO ABUSE 08/13/2010    Past Surgical History:  Procedure Laterality Date  . BIV ICD INSERTION CRT-D N/A 06/01/2019    Procedure: BIV ICD INSERTION CRT-D;  Surgeon: [name], MD;  Location: [place] [facility];  Service: Cardiovascular;  Laterality: N/A;  . CARDIAC CATHETERIZATION  10/12/10   diagnostic only: 1 vessel CAD with TIMI 3 flow, EF 40-45%.  Mild AS.  Medical mgmt of CAD.  Marland Kitchen COLONOSCOPY    . COLONOSCOPY W/ POLYPECTOMY  10/06/12   Diverticulosis and tubular adenoma w/out high grade dysplasia.  Repeat 5 yrs (Dr. Deatra Ina).  . CORONARY ATHERECTOMY N/A 01/08/2019   100% chronic RCA occlusion impossible to clear.  Med mgmt. Procedure: CORONARY ATHERECTOMY;  Surgeon: Sherren Mocha, MD;  Location: [place] [facility];  Service: Cardiovascular;  Laterality: N/A;  . POLYPECTOMY    . RIGHT HEART CATH AND CORONARY ANGIOGRAPHY N/A 12/24/2018   95% RCA occlusion, 75% obtuse marginal occlusion (3V dz). Procedure: RIGHT HEART CATH AND CORONARY ANGIOGRAPHY;  Surgeon: Lorretta Harp, MD;  Location: [place] [facility];  Service: Cardiovascular;  Laterality: N/A;  . TEE WITHOUT CARDIOVERSION N/A 11/15/2019   EF 50-55%, no LV dilation or WMA. Mod AV sclerosis w/out stenosis, mod-to-sev AI.  Procedure: TRANSESOPHAGEAL ECHOCARDIOGRAM (TEE);  Surgeon: Fay Records, MD;  Location: [facility];  Service: Cardiovascular;  Laterality: N/A;  . TRANSTHORACIC ECHOCARDIOGRAM  09/2010; 07/2017; 08/2018; 10/2019;05/2020   2012:  EF 40-45%, global hypokinesis, mild AS.  07/2017: no interval chg except mild/mod AR.  08/2018 EF 20-25%, global LV hypok, DD, biatrial enlargemt, mod AR, aortic root 3.8 cm. 10/2019 EF 50%, mild dec RV fxn, mod-sev AR, aor root dil 42 mm. 05/2020 EF 45-50%,grd I DD, mod AI, aorta 27mm, severe LAE. 10/2020 EF 45-50%, mild LVH, global hypokin, grd I DD, mod AI.    Current Medications: Current Meds  Medication Sig  . atorvastatin (LIPITOR) 40 MG tablet TAKE 1 TABLET DAILY  . carvedilol (COREG) 25 MG tablet TAKE  (1)  TABLET TWICE A DAY.  Marland Kitchen colchicine 0.6 MG tablet TAKE 2 TABLETS AT ONSET OF GOUT PAIN, REPEAT IN 1  HOUR, THEN 1 TABLETTWICE/DAY TIL RESOLVED  . ENTRESTO 49-51 MG TAKE  (1)  TABLET TWICE A DAY.  Marland Kitchen FLUoxetine (PROZAC) 20 MG capsule Take 20 mg by mouth daily.   Marland Kitchen spironolactone (ALDACTONE) 25 MG tablet TAKE (1/2) TABLET DAILY.  . [DISCONTINUED] acetaminophen (TYLENOL) 500 MG tablet Take 500-1,000 mg by mouth every 6 (six) hours as needed (for pain.).     Allergies:   Penicillins   Social History   Socioeconomic History  . Marital status: Married    Spouse name: Thayer Headings  . Number of children: 2  . Years of education: Not on file  . Highest education level: Not on file  Occupational History  . Occupation: Engineer, structural: [employer]  Tobacco Use  . Smoking status: Former Research scientist (life sciences)  . Smokeless tobacco: Current User    Types: Chew  . Tobacco comment: chews tobacco  Vaping Use  . Vaping Use: Never used  Substance and Sexual Activity  . Alcohol use: Yes    Alcohol/week: 28.0 standard drinks    Types: 28 Cans of beer per week    Comment: daily  . Drug use: No  . Sexual activity: Yes  Other Topics Concern  . Not on file  Social History Narrative   Retired Education officer, community for dental lab.  Married, two children.   Pt denies ever smoking cigarettes.   +Alcohol.   Lives in [place].   Social Determinants of Health   Financial Resource Strain: Low Risk   . Difficulty of Paying Living Expenses: Not hard at all  Food Insecurity: No Food Insecurity  . Worried About Charity fundraiser in the Last Year: Never true  . Ran Out of Food in the Last Year: Never true  Transportation Needs: No Transportation Needs  . Lack of Transportation (Medical): No  . Lack of Transportation (Non-Medical): No  Physical Activity: Inactive  . Days of Exercise per Week: 0 days  . Minutes of Exercise per Session: 0 min  Stress: No Stress Concern Present  . Feeling of Stress : Not at all  Social Connections: Moderately Isolated  . Frequency of Communication with Friends and Family:  More than three times a week  . Frequency of Social Gatherings with Friends and Family: Never  . Attends Religious Services: Never  . Active Member of Clubs or Organizations: No  . Attends Archivist Meetings: Never  . Marital Status: Married     Family History: The patient's family history includes Alzheimer's disease in his mother; COPD in his father; Congenital heart disease in his brother; Heart failure in his father. There is no history of Colon cancer, Esophageal cancer, Stomach cancer, Rectal cancer, or Colon polyps.  ROS:   Please see the history of present illness.    All other systems reviewed and are negative.  EKGs/Labs/Other Studies Reviewed:    The following studies were reviewed today: Echo 11/27/2020: IMPRESSIONS    1. Left ventricular ejection fraction, by estimation, is 45 to 50%. The  left ventricle has mildly decreased function. The left ventricle  demonstrates global hypokinesis. There is mild left ventricular  hypertrophy. Left ventricular diastolic parameters  are consistent with Grade I diastolic dysfunction (impaired relaxation).  2. Right ventricular systolic function is normal. The right ventricular  size is normal.  3. Left atrial size was mildly dilated.  4. The mitral valve is normal in structure. Trivial mitral valve  regurgitation. No evidence of mitral stenosis.  5. The aortic valve is normal in structure. There is moderate  calcification of the aortic valve. There is moderate thickening of the  aortic valve. Aortic valve regurgitation is moderate. Mild to moderate  aortic valve sclerosis/calcification is present,  without any evidence of aortic stenosis. Aortic valve mean gradient  measures 8.0 mmHg. Aortic valve Vmax measures 2.07 m/s.  6. There is borderline dilatation of the aortic root, measuring 40 mm.  7. The inferior vena cava is normal in size with greater than 50%  respiratory variability, suggesting right atrial  pressure of 3 mmHg.   Comparison(s): No significant change from prior study. Prior images  reviewed side by side.   EKG:  EKG is ordered today.  The ekg ordered today demonstrates AV sequential pacemaker 75 bpm, biventricular pacemaker  Recent Labs: 09/29/2020: ALT 18; BUN 16; Creatinine, Ser 1.02; Hemoglobin 13.7; Platelets 198.0; Potassium 4.2; Sodium 142  Recent Lipid  Panel    Component Value Date/Time   CHOL 142 09/29/2020 1022   CHOL 141 04/26/2019 0912   TRIG 41.0 09/29/2020 1022   TRIG 105 05/10/2008 0000   HDL 72.70 09/29/2020 1022   HDL 72 04/26/2019 0912   CHOLHDL 2 09/29/2020 1022   VLDL 8.2 09/29/2020 1022   LDLCALC 62 09/29/2020 1022   LDLCALC 57 04/26/2019 0912   LDLCALC 101 05/10/2008 0000     Risk Assessment/Calculations:       Physical Exam:    VS:  BP (!) 142/64   Pulse 75   Ht 5' 4.5" (1.638 m)   Wt 164 lb (74.4 kg)   BMI 27.72 kg/m     Wt Readings from Last 3 Encounters:  12/19/20 164 lb (74.4 kg)  09/29/20 172 lb 12.8 oz (78.4 kg)  08/23/20 169 lb (76.7 kg)     GEN:  Well nourished, well developed in no acute distress HEENT: Normal NECK: No JVD; No carotid bruits LYMPHATICS: No lymphadenopathy CARDIAC: RRR, 2/6 systolic ejection murmur at the right upper sternal border, 1/6 diastolic decrescendo murmur at the right upper sternal border RESPIRATORY:  Clear to auscultation without rales, wheezing or rhonchi  ABDOMEN: Soft, non-tender, non-distended MUSCULOSKELETAL:  No edema; No deformity  SKIN: Warm and dry NEUROLOGIC:  Alert and oriented x 3 PSYCHIATRIC:  Normal affect   ASSESSMENT:    1. Aortic valve insufficiency, etiology of cardiac valve disease unspecified    PLAN:    In order of problems listed above:  1. The patient has moderately severe aortic valve insufficiency with a mildly dilated aortic root.  He also has a history of the cardiomyopathy that has responded well to cardiac resynchronization therapy and medical therapy.   At this point in time he is completely asymptomatic.  I have reviewed his recent echocardiogram which demonstrates mild LV systolic dysfunction with LVEF 40 to 45%, normal RV function, moderate AI which is eccentric and could be underestimated, and LV dimensions of 55 and 42 mm at end diastole and end systole, respectively.  I reviewed his echo data over time.  His LVEF was 40 to 45% in 2019 with LV EDD of 59 and LVESD of 42 at that time.  He had a decline in LV function in 2020 with LVEF 20 to 25% and continued moderate aortic insufficiency.  LV dimensions were similar.  He underwent cardiac resynchronization in November 2020.  A follow-up echocardiogram in 2021 demonstrated improvement in LV function with LVEF 50%, moderate to severe aortic insufficiency, mildly dilated aortic root of 42 mm, and an LVEDD of 5.9 and ESD of 4.4.  In review of all of his echo data, his LV function is relatively stable over time and his LV dimensions have not changed over the past 3 years.  As he remains asymptomatic, I would recommend continued observation at this time.  I have recommended a follow-up echocardiogram in 6 months when he sees Dr. Radford Pax for scheduled follow-up.  I would like to see him back in 1 year for valve clinic follow-up.  As part of his evaluation today, I reviewed the cardiac surgical consultation from Dr. Cyndia Bent last year at which time ongoing medical therapy was recommended.  Of note, the patient has known multivessel coronary artery disease, and if he does require aortic valve replacement in the future, he will likely need combined CABG/aortic valve replacement.  He tells me at this point that he would not be interested in pursuing open heart surgery under  any circumstances.  I told him we could cross this bridge if and when we get there.        Medication Adjustments/Labs and Tests Ordered: Current medicines are reviewed at length with the patient today.  Concerns regarding medicines are outlined  above.  Orders Placed This Encounter  Procedures  . EKG 12-Lead  . ECHOCARDIOGRAM COMPLETE   No orders of the defined types were placed in this encounter.   Patient Instructions  Medication Instructions:  Your provider recommends that you continue on your current medications as directed. Please refer to the Current Medication list given to you today.   *If you need a refill on your cardiac medications before your next appointment, please call your pharmacy*  Testing/Procedures: Your provider has requested that you have an echocardiogram in 6 months. Echocardiography is a painless test that uses sound waves to create images of your heart. It provides your doctor with information about the size and shape of your heart and how well your heart's chambers and valves are working. This procedure takes approximately one hour. There are no restrictions for this procedure.  Follow-Up: At [facility], you and your health needs are our priority.  As part of our continuing mission to provide you with exceptional heart care, we have created designated Provider Care Teams.  These Care Teams include your primary Cardiologist (physician) and Advanced Practice Providers (APPs -  Physician Assistants and Nurse Practitioners) who all work together to provide you with the care you need, when you need it. Your next appointment:   6 month(s) The format for your next appointment:   In Person Provider:   Fransico Him    Signed, Sherren Mocha, MD  12/19/2020 4:44 PM    [place]

## 2020-12-19 NOTE — Patient Instructions (Signed)
Medication Instructions:  Your provider recommends that you continue on your current medications as directed. Please refer to the Current Medication list given to you today.   *If you need a refill on your cardiac medications before your next appointment, please call your pharmacy*  Testing/Procedures: Your provider has requested that you have an echocardiogram in 6 months. Echocardiography is a painless test that uses sound waves to create images of your heart. It provides your doctor with information about the size and shape of your heart and how well your heart's chambers and valves are working. This procedure takes approximately one hour. There are no restrictions for this procedure.  Follow-Up: At [facility], you and your health needs are our priority.  As part of our continuing mission to provide you with exceptional heart care, we have created designated Provider Care Teams.  These Care Teams include your primary Cardiologist (physician) and Advanced Practice Providers (APPs -  Physician Assistants and Nurse Practitioners) who all work together to provide you with the care you need, when you need it. Your next appointment:   6 month(s) The format for your next appointment:   In Person Provider:   Fransico Him

## 2020-12-22 NOTE — Progress Notes (Signed)
Remote ICD transmission.   

## 2020-12-25 ENCOUNTER — Ambulatory Visit (INDEPENDENT_AMBULATORY_CARE_PROVIDER_SITE_OTHER): Payer: Medicare PPO

## 2020-12-25 DIAGNOSIS — Z95810 Presence of automatic (implantable) cardiac defibrillator: Secondary | ICD-10-CM | POA: Diagnosis not present

## 2020-12-25 DIAGNOSIS — I5022 Chronic systolic (congestive) heart failure: Secondary | ICD-10-CM | POA: Diagnosis not present

## 2020-12-27 NOTE — Progress Notes (Signed)
EPIC Encounter for ICM Monitoring  Patient Name: Anthony Daugherty is a 75 y.o. male Date: 12/27/2020 Primary Care Physican: Tammi Sou, MD Primary Cardiologist: Radford Pax Electrophysiologist: Allred Bi-V Pacing: 98% (May report)           12/19/2020 Weight: 164 lbs                                                            Transmission reviewed.    CorVue thoracic impedance normal.   Prescribed: Spironolactone 25 mg take 0.5 tablet daily.   Labs: 09/29/2020 Creatinine 1.02, BUN 16, Potassium 4.2, Sodium 142, GFR 72.62 A complete set of results can be found in Results Review.   Recommendations:  No Changes     Follow-up plan: ICM clinic phone appointment on 01/29/2021.   91 day device clinic remote transmission 03/01/2021.   EP/Cardiology Office Visits: 06/20/2021 with Dr. Radford Pax.  Recall 05/15/2020 with Dr Rayann Heman.  Message sent to EP scheduler on 6/15 to call patient for over due appointment.   Copy of ICM check sent to Dr. Rayann Heman.   3 month ICM trend: 12/21/2020.    Rosalene Billings, RN 12/27/2020 9:35 AM

## 2021-01-01 ENCOUNTER — Other Ambulatory Visit: Payer: Self-pay

## 2021-01-01 ENCOUNTER — Encounter: Payer: Self-pay | Admitting: Internal Medicine

## 2021-01-01 ENCOUNTER — Ambulatory Visit: Payer: Medicare PPO | Admitting: Internal Medicine

## 2021-01-01 VITALS — BP 128/66 | HR 63 | Ht 65.5 in | Wt 166.6 lb

## 2021-01-01 DIAGNOSIS — I5022 Chronic systolic (congestive) heart failure: Secondary | ICD-10-CM | POA: Diagnosis not present

## 2021-01-01 DIAGNOSIS — I255 Ischemic cardiomyopathy: Secondary | ICD-10-CM

## 2021-01-01 DIAGNOSIS — I2510 Atherosclerotic heart disease of native coronary artery without angina pectoris: Secondary | ICD-10-CM | POA: Diagnosis not present

## 2021-01-01 NOTE — Progress Notes (Signed)
PCP: Tammi Sou, MD Primary Cardiologist: Dr Cooper/ Dr Radford Pax Primary EP: Dr Rayann Heman  Anthony Daugherty is a 75 y.o. male who presents today for routine electrophysiology followup.  Since last being seen in our clinic, the patient reports doing very well.  Today, he denies symptoms of palpitations, chest pain, shortness of breath,  lower extremity edema, dizziness, presyncope, syncope, or ICD shocks.  The patient is otherwise without complaint today.   Past Medical History:  Diagnosis Date   Aortic regurgitation    Mild-mod on echo 07/2017.  Moderate 08/2018.  Moderate-severe on echo 04/2019--> TEE 11/15/19 EF 50-55%, no LV dilation or WMA. Mod AV sclerosis w/out stenosis, mod-to-sev AI-.>rpt echo 6 mo. 11/2020 moderate AI   Aortic root dilatation ([place]) 10/2018   11/17/19 45 mm aortic root at the sinuses of Valsalva->Dr. Radford Pax referred pt to CT surgeon. 52022 echo->40 mm   Aortic stenosis, mild    Arthritis    L-Spine   CARDIAC MURMUR 56/43/3295; 1884   Systolic and diastolic as of 07/6604.   Cardiomyopathy ([place]) 10/12/2010   Global hypokinesis, EF 40-45% by cath and echo (2012 echo and 2019 echo).  Feb 2020 EF 20-25%. - mixed ICM/NICM (alcohol and CAD)  October 2020 EF 25-30%. 11/2020 EF 40-45%.   Chronic combined systolic and diastolic CHF (congestive heart failure) (HCC)    max med mgmt, plus CRT-D placed 05/2019, EF improved to 50% after.   Coronary artery disease 11/16/2010   a. 2012 - 80% OM lesion. b. Cath 12/2018 - showing 95% prox RCA, 75% OM1, 50% prox-mid LAD. Unsuccessful PCI to 100% chronically occluded RCA summer 2020.   Diverticulosis    Erectile dysfunction 08/16/2011   ESSENTIAL HYPERTENSION 07/19/2010   Qualifier: Diagnosis of  By: Anitra Lauth M.D., Brien Few    GAD (generalized anxiety disorder)    managed by Dr. Toy Care with prozac 20mg  qd   Gout    Usually Right great toe; colchicine helps well   Habitual alcohol use    Hx of colonoscopy 2004; 2014   Initial was  normal; 2014-polypectomy and diverticulosis.  Recall 2019.   Hyperlipidemia 10/23/2012   LBBB (left bundle branch block)    CRT-D placed 05/2019   Low back strain 10/24/2013   Obesity, Class I, BMI 30-34.9    Pulmonary nodule    0.5 cm RLL subpleural nodule.  Pt NEVER SMOKER.  Low risk->no f/u imaging is needed.   Thyroid nodule 04/23/2013   TOBACCO ABUSE 08/13/2010   Past Surgical History:  Procedure Laterality Date   BIV ICD INSERTION CRT-D N/A 06/01/2019   Procedure: BIV ICD INSERTION CRT-D;  Surgeon: [name], MD;  Location: [place] [facility];  Service: Cardiovascular;  Laterality: N/A;   CARDIAC CATHETERIZATION  10/12/10   diagnostic only: 1 vessel CAD with TIMI 3 flow, EF 40-45%.  Mild AS.  Medical mgmt of CAD.   COLONOSCOPY     COLONOSCOPY W/ POLYPECTOMY  10/06/12   Diverticulosis and tubular adenoma w/out high grade dysplasia.  Repeat 5 yrs (Dr. Deatra Ina).   CORONARY ATHERECTOMY N/A 01/08/2019   100% chronic RCA occlusion impossible to clear.  Med mgmt. Procedure: CORONARY ATHERECTOMY;  Surgeon: Sherren Mocha, MD;  Location: [place] [facility];  Service: Cardiovascular;  Laterality: N/A;   POLYPECTOMY     RIGHT HEART CATH AND CORONARY ANGIOGRAPHY N/A 12/24/2018   95% RCA occlusion, 75% obtuse marginal occlusion (3V dz). Procedure: RIGHT HEART CATH AND CORONARY ANGIOGRAPHY;  Surgeon: Lorretta Harp, MD;  Location: [place] [facility];  Service: Cardiovascular;  Laterality: N/A;   TEE WITHOUT CARDIOVERSION N/A 11/15/2019   EF 50-55%, no LV dilation or WMA. Mod AV sclerosis w/out stenosis, mod-to-sev AI.  Procedure: TRANSESOPHAGEAL ECHOCARDIOGRAM (TEE);  Surgeon: Fay Records, MD;  Location: [facility]COPY;  Service: Cardiovascular;  Laterality: N/A;   TRANSTHORACIC ECHOCARDIOGRAM  09/2010; 07/2017; 08/2018; 10/2019;05/2020   2012:  EF 40-45%, global hypokinesis, mild AS.  07/2017: no interval chg except mild/mod AR.  08/2018 EF 20-25%, global LV hypok, DD, biatrial enlargemt, mod AR,  aortic root 3.8 cm. 10/2019 EF 50%, mild dec RV fxn, mod-sev AR, aor root dil 42 mm. 05/2020 EF 45-50%,grd I DD, mod AI, aorta 101mm, severe LAE. 10/2020 EF 45-50%, mild LVH, global hypokin, grd I DD, mod AI.    ROS- all systems are reviewed and negative except as per HPI above  Current Outpatient Medications  Medication Sig Dispense Refill   atorvastatin (LIPITOR) 40 MG tablet TAKE 1 TABLET DAILY 90 tablet 1   carvedilol (COREG) 25 MG tablet TAKE  (1)  TABLET TWICE A DAY. 180 tablet 2   colchicine 0.6 MG tablet TAKE 2 TABLETS AT ONSET OF GOUT PAIN, REPEAT IN 1 HOUR, THEN 1 TABLETTWICE/DAY TIL RESOLVED 30 tablet 1   ENTRESTO 49-51 MG TAKE  (1)  TABLET TWICE A DAY. 180 tablet 3   FLUoxetine (PROZAC) 20 MG capsule Take 20 mg by mouth daily.      spironolactone (ALDACTONE) 25 MG tablet TAKE (1/2) TABLET DAILY. 45 tablet 1   No current facility-administered medications for this visit.    Physical Exam: Vitals:   01/01/21 1529  BP: 128/66  Pulse: 63  SpO2: 94%  Weight: 166 lb 9.6 oz (75.6 kg)  Height: 5' 5.5" (1.664 m)     GEN- The patient is well appearing, alert and oriented x 3 today.   Head- normocephalic, atraumatic Eyes-  Sclera clear, conjunctiva pink Ears- hearing intact Oropharynx- clear Lungs-  normal work of breathing Chest- ICD pocket is well healed Heart- Regular rate and rhythm,  GI- soft, NT, ND, + BS Extremities- no clubbing, cyanosis, or edema  ICD interrogation- reviewed in detail today,  See PACEART report  ekg tracing ordered today is personally reviewed and shows sinus with BiV pacing, PR 210 msec  Wt Readings from Last 3 Encounters:  01/01/21 166 lb 9.6 oz (75.6 kg)  12/19/20 164 lb (74.4 kg)  09/29/20 172 lb 12.8 oz (78.4 kg)   Echo 11/27/20- EF 45-50%  Assessment and Plan:  1.  Chronic systolic dysfunction/ ischemic CM/ CAD euvolemic today No ischemic symptoms Stable on an appropriate medical regimen Normal ICD function See Pace Art report No  changes today he is not device dependant today followed in ICM device clinic He is accompanied by his wife today.  We spoke at length regarding education about having an ICD.  We discussed treatment of sudden death as well as biventricular pacing.  2. HTN Continue spironolactone 12.5mg  daily  3. HL Continue lipitor 40mg  daily   Risks, benefits and potential toxicities for medications prescribed and/or refilled reviewed with patient today.   Return to see EP PA in a year  [name] MD, [facility] 01/01/2021 3:46 PM

## 2021-01-01 NOTE — Patient Instructions (Addendum)
Medication Instructions:  Your physician recommends that you continue on your current medications as directed. Please refer to the Current Medication list given to you today.  Labwork: None ordered.  Testing/Procedures: None ordered.  Follow-Up: Your physician wants you to follow-up in: 01/07/22 at 2:15 pm with [name], MD   Remote monitoring is used to monitor your ICD from home. This monitoring reduces the number of office visits required to check your device to one time per year. It allows [place] to keep an eye on the functioning of your device to ensure it is working properly. You are scheduled for a device check from home on 01/29/21. You may send your transmission at any time that day. If you have a wireless device, the transmission will be sent automatically. After your physician reviews your transmission, you will receive a postcard with your next transmission date.  Any Other Special Instructions Will Be Listed Below (If Applicable).  If you need a refill on your cardiac medications before your next appointment, please call your pharmacy.

## 2021-01-17 ENCOUNTER — Institutional Professional Consult (permissible substitution): Payer: Medicare PPO | Admitting: Cardiovascular Disease

## 2021-01-29 ENCOUNTER — Ambulatory Visit (INDEPENDENT_AMBULATORY_CARE_PROVIDER_SITE_OTHER): Payer: Medicare PPO

## 2021-01-29 DIAGNOSIS — Z95810 Presence of automatic (implantable) cardiac defibrillator: Secondary | ICD-10-CM | POA: Diagnosis not present

## 2021-01-29 DIAGNOSIS — I5022 Chronic systolic (congestive) heart failure: Secondary | ICD-10-CM | POA: Diagnosis not present

## 2021-01-31 ENCOUNTER — Telehealth: Payer: Self-pay

## 2021-01-31 NOTE — Progress Notes (Signed)
EPIC Encounter for ICM Monitoring  Patient Name: Anthony Daugherty is a 75 y.o. male Date: 01/31/2021 Primary Care Physican: Tammi Sou, MD Primary Cardiologist: Radford Pax Electrophysiologist: Allred Bi-V Pacing: 98%         12/19/2020 Weight: 164 lbs                                                            Attempted call to patient and unable to reach.  Left message to return call detailed message per DPR regarding transmission. Transmission reviewed.    CorVue thoracic impedance normal.   Prescribed: Spironolactone 25 mg take 0.5 tablet daily.   Labs: 09/29/2020 Creatinine 1.02, BUN 16, Potassium 4.2, Sodium 142, GFR 72.62 A complete set of results can be found in Results Review.   Recommendations:  Left voice mail with ICM number and encouraged to call if experiencing any fluid symptoms.   Follow-up plan: ICM clinic phone appointment on 03/05/2021.   91 day device clinic remote transmission 03/01/2021.   EP/Cardiology Office Visits: 06/20/2021 with Dr. Radford Pax.  Recall 12/18/2021 with Dr Rayann Heman.     Copy of ICM check sent to Dr. Rayann Heman.    3 month ICM trend: 01/29/2021.    1 Year ICM trend:       Rosalene Billings, RN 01/31/2021 9:43 AM

## 2021-01-31 NOTE — Telephone Encounter (Signed)
Remote ICM transmission received.  Attempted call to patient regarding ICM remote transmission and left detailed message per DPR.  Advised to return call for any fluid symptoms or questions. Next ICM remote transmission scheduled 03/05/2021.    

## 2021-03-01 ENCOUNTER — Ambulatory Visit (INDEPENDENT_AMBULATORY_CARE_PROVIDER_SITE_OTHER): Payer: Medicare PPO

## 2021-03-01 DIAGNOSIS — I255 Ischemic cardiomyopathy: Secondary | ICD-10-CM

## 2021-03-05 ENCOUNTER — Ambulatory Visit (INDEPENDENT_AMBULATORY_CARE_PROVIDER_SITE_OTHER): Payer: Medicare PPO

## 2021-03-05 DIAGNOSIS — I5022 Chronic systolic (congestive) heart failure: Secondary | ICD-10-CM | POA: Diagnosis not present

## 2021-03-05 DIAGNOSIS — Z95810 Presence of automatic (implantable) cardiac defibrillator: Secondary | ICD-10-CM

## 2021-03-05 LAB — CUP PACEART REMOTE DEVICE CHECK
Battery Remaining Longevity: 73 mo
Battery Remaining Percentage: 78 %
Battery Voltage: 2.96 V
Brady Statistic AP VP Percent: 37 %
Brady Statistic AP VS Percent: 1 %
Brady Statistic AS VP Percent: 62 %
Brady Statistic AS VS Percent: 1 %
Brady Statistic RA Percent Paced: 35 %
Date Time Interrogation Session: 20220820072855
HighPow Impedance: 65 Ohm
Implantable Lead Implant Date: 20201117
Implantable Lead Implant Date: 20201117
Implantable Lead Implant Date: 20201117
Implantable Lead Location: 753858
Implantable Lead Location: 753859
Implantable Lead Location: 753860
Implantable Pulse Generator Implant Date: 20201117
Lead Channel Impedance Value: 360 Ohm
Lead Channel Impedance Value: 390 Ohm
Lead Channel Impedance Value: 710 Ohm
Lead Channel Pacing Threshold Amplitude: 0.625 V
Lead Channel Pacing Threshold Amplitude: 1 V
Lead Channel Pacing Threshold Amplitude: 1.125 V
Lead Channel Pacing Threshold Pulse Width: 0.5 ms
Lead Channel Pacing Threshold Pulse Width: 0.5 ms
Lead Channel Pacing Threshold Pulse Width: 0.5 ms
Lead Channel Sensing Intrinsic Amplitude: 1.7 mV
Lead Channel Sensing Intrinsic Amplitude: 11.8 mV
Lead Channel Setting Pacing Amplitude: 1.625
Lead Channel Setting Pacing Amplitude: 1.625
Lead Channel Setting Pacing Amplitude: 2 V
Lead Channel Setting Pacing Pulse Width: 0.5 ms
Lead Channel Setting Pacing Pulse Width: 0.5 ms
Lead Channel Setting Sensing Sensitivity: 0.5 mV
Pulse Gen Serial Number: 111012743

## 2021-03-06 ENCOUNTER — Other Ambulatory Visit: Payer: Self-pay | Admitting: Physician Assistant

## 2021-03-07 NOTE — Progress Notes (Signed)
EPIC Encounter for ICM Monitoring  Patient Name: Anthony Daugherty is a 75 y.o. male Date: 03/07/2021 Primary Care Physican: Tammi Sou, MD Primary Cardiologist: Radford Pax Electrophysiologist: Allred Bi-V Pacing: 98%         12/19/2020 Weight: 164 lbs                                                            Transmission reviewed.    CorVue thoracic impedance normal.   Prescribed: Spironolactone 25 mg take 0.5 tablet daily.   Labs: 09/29/2020 Creatinine 1.02, BUN 16, Potassium 4.2, Sodium 142, GFR 72.62 A complete set of results can be found in Results Review.   Recommendations: No changes   Follow-up plan: ICM clinic phone appointment on 04/16/2021.   91 day device clinic remote transmission 05/31/2021.   EP/Cardiology Office Visits: 06/20/2021 with Dr. Radford Pax.  Recall 12/18/2021 with Dr Rayann Heman.     Copy of ICM check sent to Dr. Rayann Heman.     3 month ICM trend: 03/05/2021.    1 Year ICM trend:       Rosalene Billings, RN 03/07/2021 3:40 PM

## 2021-03-21 NOTE — Progress Notes (Signed)
Remote ICD transmission.   

## 2021-04-03 ENCOUNTER — Encounter: Payer: Self-pay | Admitting: Physician Assistant

## 2021-04-03 ENCOUNTER — Ambulatory Visit: Payer: Medicare PPO | Admitting: Family Medicine

## 2021-04-03 ENCOUNTER — Encounter: Payer: Self-pay | Admitting: Family Medicine

## 2021-04-03 ENCOUNTER — Other Ambulatory Visit: Payer: Self-pay

## 2021-04-03 VITALS — BP 118/68 | HR 72 | Temp 97.4°F | Ht 65.5 in | Wt 164.0 lb

## 2021-04-03 DIAGNOSIS — E7800 Pure hypercholesterolemia, unspecified: Secondary | ICD-10-CM | POA: Diagnosis not present

## 2021-04-03 DIAGNOSIS — R413 Other amnesia: Secondary | ICD-10-CM

## 2021-04-03 DIAGNOSIS — I2510 Atherosclerotic heart disease of native coronary artery without angina pectoris: Secondary | ICD-10-CM

## 2021-04-03 DIAGNOSIS — I10 Essential (primary) hypertension: Secondary | ICD-10-CM | POA: Diagnosis not present

## 2021-04-03 LAB — LIPID PANEL
Cholesterol: 132 mg/dL (ref 0–200)
HDL: 67.9 mg/dL (ref >39.00)
LDL Cholesterol: 53 mg/dL (ref 0–99)
NonHDL: 64.32
Total CHOL/HDL Ratio: 2
Triglycerides: 57 mg/dL (ref 0.0–149.0)
VLDL: 11.4 mg/dL (ref 0.0–40.0)

## 2021-04-03 LAB — COMPREHENSIVE METABOLIC PANEL
ALT: 10 U/L (ref 0–53)
AST: 13 U/L (ref 0–37)
Albumin: 4.4 g/dL (ref 3.5–5.2)
Alkaline Phosphatase: 58 U/L (ref 39–117)
BUN: 12 mg/dL (ref 6–23)
CO2: 27 mEq/L (ref 19–32)
Calcium: 8.9 mg/dL (ref 8.4–10.5)
Chloride: 105 mEq/L (ref 96–112)
Creatinine, Ser: 0.87 mg/dL (ref 0.40–1.50)
GFR: 84.95 mL/min (ref >60.00)
Glucose, Bld: 92 mg/dL (ref 70–99)
Potassium: 3.9 mEq/L (ref 3.5–5.1)
Sodium: 140 mEq/L (ref 135–145)
Total Bilirubin: 0.9 mg/dL (ref 0.2–1.2)
Total Protein: 6.6 g/dL (ref 6.0–8.3)

## 2021-04-03 NOTE — Progress Notes (Signed)
OFFICE VISIT  04/03/2021  CC:  Chief Complaint  Patient presents with   Follow-up    RCI, pt is fasting    HPI:    Patient is a 75 y.o. Caucasian male who presents accompanied by his wife Thayer Headings for 6 mo f/u HTN, HLD, chronic combines syst and diast HF. A/P as of last visit: "1) HTN: stable.  Cont carvedolol, entresto, aldactone. Lytes/cr today.    2) HLD: tolerating atorva 40mg  qd. FLP and hepatic panel today.   3) Alc abuse: pt admits to 2 beers a day lately.  Encouraged abstinence.   4) Dilated CM, AR, aortic root dilatation:  Asymptomatic.  No sign of volume overload. EF 40-45% on echo 05/2020. Followed closely by cardiologist. Cont BB, entresto, spirono, +CRT-D.   5) Health maintenance exam: Reviewed age and gender appropriate health maintenance issues (prudent diet, regular exercise, health risks of tobacco and excessive alcohol, use of seatbelts, fire alarms in home, use of sunscreen).  Also reviewed age and gender appropriate health screening as well as vaccine recommendations. Vaccines: ALL UTD. Labs: cmet, flp, cbc Prostate ca screening: shared decision making process utilized today->no further screening indicated due to age. Colon ca screening: adenoma 2014, is overdue for rpt colonoscopy-->pt to contact GI to arrange.   6) Pulm nodule 11/2019: no repeat CT needed b/c pt low risk (NEVER SMOKER)."  INTERIM HX: Says he feels well. Still drinking about 2 beers per day.  He gets gout when drinks more than this. Wife and sons are concerned about Mike's memory: often forgets things he had conversation about shortly before.  Still cognitively intact.   Home bp's <130/80 consistently.  CAD, with ischemic CM + LBBB.  Followed closely by cardiology, stable/no changes at last f/u with them 01/01/21. Mod AI + aortic root dilation: stable, ongoing obs recommended by Dr. Burt Knack at last f/u 12/19/20.  ROS as above, plus--> no fevers, no CP, no SOB, no wheezing, no cough, no  dizziness, no HAs, no rashes, no melena/hematochezia.  No polyuria or polydipsia.  No myalgias or arthralgias.  No focal weakness, paresthesias, or tremors.  No acute vision or hearing abnormalities.  No dysuria or unusual/new urinary urgency or frequency.  No recent changes in lower legs. No n/v/d or abd pain.  No palpitations.    Past Medical History:  Diagnosis Date   Aortic regurgitation    Mild-mod on echo 07/2017.  Moderate 08/2018.  Moderate-severe on echo 04/2019--> TEE 11/15/19 EF 50-55%, no LV dilation or WMA. Mod AV sclerosis w/out stenosis, mod-to-sev AI-.>rpt echo 6 mo. 11/2020 moderate AI   Aortic root dilatation ([place]) 10/2018   11/17/19 45 mm aortic root at the sinuses of Valsalva->Dr. Radford Pax referred pt to CT surgeon. 52022 echo->40 mm   Aortic stenosis, mild    Arthritis    L-Spine   CARDIAC MURMUR 16/04/9603; 5409   Systolic and diastolic as of 02/1190.   Cardiomyopathy ([place]) 10/12/2010   Global hypokinesis, EF 40-45% by cath and echo (2012 echo and 2019 echo).  Feb 2020 EF 20-25%. - mixed ICM/NICM (alcohol and CAD)  October 2020 EF 25-30%. 11/2020 EF 40-45%.   Chronic combined systolic and diastolic CHF (congestive heart failure) (HCC)    max med mgmt, plus CRT-D placed 05/2019, EF improved to 50% after.   Coronary artery disease 11/16/2010   a. 2012 - 80% OM lesion. b. Cath 12/2018 - showing 95% prox RCA, 75% OM1, 50% prox-mid LAD. Unsuccessful PCI to 100% chronically occluded RCA summer 2020.  Diverticulosis    Erectile dysfunction 08/16/2011   ESSENTIAL HYPERTENSION 07/19/2010   Qualifier: Diagnosis of  By: Anitra Lauth M.D., Brien Few    GAD (generalized anxiety disorder)    managed by Dr. Toy Care with prozac 20mg  qd   Gout    Usually Right great toe; colchicine helps well   Habitual alcohol use    Hx of colonoscopy 2004; 2014   Initial was normal; 2014-polypectomy and diverticulosis.  Recall 2019.   Hyperlipidemia 10/23/2012   LBBB (left bundle branch block)    CRT-D placed  05/2019   Low back strain 10/24/2013   Obesity, Class I, BMI 30-34.9    Pulmonary nodule    0.5 cm RLL subpleural nodule.  Pt NEVER SMOKER.  Low risk->no f/u imaging is needed.   Thyroid nodule 04/23/2013    Past Surgical History:  Procedure Laterality Date   BIV ICD INSERTION CRT-D N/A 06/01/2019   Procedure: BIV ICD INSERTION CRT-D;  Surgeon: [name], MD;  Location: [facility];  Service: Cardiovascular;  Laterality: N/A;   CARDIAC CATHETERIZATION  10/12/10   diagnostic only: 1 vessel CAD with TIMI 3 flow, EF 40-45%.  Mild AS.  Medical mgmt of CAD.   COLONOSCOPY     COLONOSCOPY W/ POLYPECTOMY  10/06/12   Diverticulosis and tubular adenoma w/out high grade dysplasia.  Repeat 5 yrs (Dr. Deatra Ina).   CORONARY ATHERECTOMY N/A 01/08/2019   100% chronic RCA occlusion impossible to clear.  Med mgmt. Procedure: CORONARY ATHERECTOMY;  Surgeon: Sherren Mocha, MD;  Location: [facility];  Service: Cardiovascular;  Laterality: N/A;   POLYPECTOMY     RIGHT HEART CATH AND CORONARY ANGIOGRAPHY N/A 12/24/2018   95% RCA occlusion, 75% obtuse marginal occlusion (3V dz). Procedure: RIGHT HEART CATH AND CORONARY ANGIOGRAPHY;  Surgeon: Lorretta Harp, MD;  Location: [facility];  Service: Cardiovascular;  Laterality: N/A;   TEE WITHOUT CARDIOVERSION N/A 11/15/2019   EF 50-55%, no LV dilation or WMA. Mod AV sclerosis w/out stenosis, mod-to-sev AI.  Procedure: TRANSESOPHAGEAL ECHOCARDIOGRAM (TEE);  Surgeon: Fay Records, MD;  Location: [facility];  Service: Cardiovascular;  Laterality: N/A;   TRANSTHORACIC ECHOCARDIOGRAM  09/2010; 07/2017; 08/2018; 10/2019;05/2020   2012:  EF 40-45%, global hypokinesis, mild AS.  07/2017: no interval chg except mild/mod AR.  08/2018 EF 20-25%, global LV hypok, DD, biatrial enlargemt, mod AR, aortic root 3.8 cm. 10/2019 EF 50%, mild dec RV fxn, mod-sev AR, aor root dil 42 mm. 05/2020 EF 45-50%,grd I DD, mod AI, aorta 22mm, severe LAE. 10/2020 EF 45-50%, mild  LVH, global hypokin, grd I DD, mod AI.    Outpatient Medications Prior to Visit  Medication Sig Dispense Refill   atorvastatin (LIPITOR) 40 MG tablet TAKE 1 TABLET DAILY 90 tablet 1   carvedilol (COREG) 25 MG tablet TAKE  (1)  TABLET TWICE A DAY. 180 tablet 2   colchicine 0.6 MG tablet TAKE 2 TABLETS AT ONSET OF GOUT PAIN, REPEAT IN 1 HOUR, THEN 1 TABLETTWICE/DAY TIL RESOLVED 30 tablet 1   ENTRESTO 49-51 MG TAKE  (1)  TABLET TWICE A DAY. 180 tablet 3   FLUoxetine (PROZAC) 20 MG capsule Take 20 mg by mouth daily.      spironolactone (ALDACTONE) 25 MG tablet Take 0.5 tablets (12.5 mg total) by mouth daily. 45 tablet 1   No facility-administered medications prior to visit.    Allergies  Allergen Reactions   Penicillins Swelling    Did it involve swelling of the face/tongue/throat,  SOB, or low BP? No Did it involve sudden or severe rash/hives, skin peeling, or any reaction on the inside of your mouth or nose? No Did you need to seek medical attention at a hospital or doctor's office? No When did it last happen? ~10 years ago If all above answers are "NO", may proceed with cephalosporin use.     ROS As per HPI  PE: Vitals with BMI 04/03/2021 01/01/2021 12/19/2020  Height 5' 5.5" 5' 5.5" 5' 4.5"  Weight 164 lbs 166 lbs 10 oz 164 lbs  BMI 26.87 16.01 09.32  Systolic 355 732 202  Diastolic 68 66 64  Pulse 72 63 75     Gen: Alert, well appearing.  Patient is oriented to person, place, time, and situation. AFFECT: pleasant, lucid thought and speech. CV: RRR, no m/r/g.   LUNGS: CTA bilat, nonlabored resps, good aeration in all lung fields. EXT: no clubbing or cyanosis.  no edema.    LABS:  Lab Results  Component Value Date   TSH 2.350 12/21/2018   Lab Results  Component Value Date   WBC 4.2 09/29/2020   HGB 13.7 09/29/2020   HCT 39.7 09/29/2020   MCV 93.2 09/29/2020   PLT 198.0 09/29/2020   Lab Results  Component Value Date   CREATININE 1.02 09/29/2020   BUN 16  09/29/2020   NA 142 09/29/2020   K 4.2 09/29/2020   CL 106 09/29/2020   CO2 27 09/29/2020   Lab Results  Component Value Date   ALT 18 09/29/2020   AST 20 09/29/2020   ALKPHOS 62 09/29/2020   BILITOT 1.1 09/29/2020   Lab Results  Component Value Date   CHOL 142 09/29/2020   Lab Results  Component Value Date   HDL 72.70 09/29/2020   Lab Results  Component Value Date   LDLCALC 62 09/29/2020   Lab Results  Component Value Date   TRIG 41.0 09/29/2020   Lab Results  Component Value Date   CHOLHDL 2 09/29/2020   Lab Results  Component Value Date   PSA 2.54 08/01/2017   PSA 1.42 12/08/2015   PSA 1.59 07/22/2014   IMPRESSION AND PLAN:  1) Memory loss/impairment.  Wife/patient request neurology referral so I ordered this today.  2) HTN: well controlled on 12.5mg  aldactone qd. Also on entresto and coreg for HF.  3) HLD: tolerating atorva 40 qd. Goal LDL <70. FLP and hepatic panel today.  4) CAD, ischemic CM, LBBB: stable on BB, statin, entresto, aldactone. He has not been on ASA so I recommended he start 81mg  qd today.  An After Visit Summary was printed and given to the patient.  FOLLOW UP: Return in about 6 months (around 10/01/2021) for annual CPE (fasting).  Signed:  Crissie Sickles, MD           04/03/2021

## 2021-04-09 DIAGNOSIS — I509 Heart failure, unspecified: Secondary | ICD-10-CM | POA: Diagnosis not present

## 2021-04-09 DIAGNOSIS — F17220 Nicotine dependence, chewing tobacco, uncomplicated: Secondary | ICD-10-CM | POA: Diagnosis not present

## 2021-04-09 DIAGNOSIS — Z7722 Contact with and (suspected) exposure to environmental tobacco smoke (acute) (chronic): Secondary | ICD-10-CM | POA: Diagnosis not present

## 2021-04-09 DIAGNOSIS — Z820 Family history of epilepsy and other diseases of the nervous system: Secondary | ICD-10-CM | POA: Diagnosis not present

## 2021-04-09 DIAGNOSIS — Z8249 Family history of ischemic heart disease and other diseases of the circulatory system: Secondary | ICD-10-CM | POA: Diagnosis not present

## 2021-04-09 DIAGNOSIS — E785 Hyperlipidemia, unspecified: Secondary | ICD-10-CM | POA: Diagnosis not present

## 2021-04-09 DIAGNOSIS — M109 Gout, unspecified: Secondary | ICD-10-CM | POA: Diagnosis not present

## 2021-04-09 DIAGNOSIS — I110 Hypertensive heart disease with heart failure: Secondary | ICD-10-CM | POA: Diagnosis not present

## 2021-04-09 DIAGNOSIS — Z7982 Long term (current) use of aspirin: Secondary | ICD-10-CM | POA: Diagnosis not present

## 2021-04-11 ENCOUNTER — Other Ambulatory Visit: Payer: Self-pay | Admitting: Physician Assistant

## 2021-04-11 ENCOUNTER — Other Ambulatory Visit: Payer: Self-pay | Admitting: Family Medicine
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
## 2021-04-12 ENCOUNTER — Other Ambulatory Visit: Payer: Self-pay

## 2021-04-12 ENCOUNTER — Ambulatory Visit: Payer: Medicare PPO | Admitting: Physician Assistant

## 2021-04-12 ENCOUNTER — Other Ambulatory Visit (INDEPENDENT_AMBULATORY_CARE_PROVIDER_SITE_OTHER): Payer: Medicare PPO

## 2021-04-12 ENCOUNTER — Encounter: Payer: Self-pay | Admitting: Physician Assistant

## 2021-04-12 VITALS — BP 159/85 | HR 72 | Resp 20 | Ht 65.5 in | Wt 167.0 lb

## 2021-04-12 DIAGNOSIS — G3184 Mild cognitive impairment, so stated: Secondary | ICD-10-CM | POA: Diagnosis not present

## 2021-04-12 DIAGNOSIS — R413 Other amnesia: Secondary | ICD-10-CM | POA: Diagnosis not present

## 2021-04-12 HISTORY — DX: Mild cognitive impairment of uncertain or unknown etiology: G31.84

## 2021-04-12 LAB — VITAMIN B12: Vitamin B-12: 178 pg/mL — ABNORMAL LOW (ref 211–911)

## 2021-04-12 LAB — TSH: TSH: 3.45 u[IU]/mL (ref 0.35–5.50)

## 2021-04-12 MED ORDER — DONEPEZIL HCL 10 MG PO TABS: ORAL_TABLET | ORAL | 11 refills | Status: DC

## 2021-04-12 NOTE — Patient Instructions (Addendum)
It was a pleasure to see you today at our office.   Recommendations:  Neurocognitive evaluation at our office MRI of the brain, the radiology office will call you to arrange you appointment Referral to Psychiatry  Start Donepezil 10 mg (half tablet 5 mg at night for 2 weeks then increase to 10 mg at night ) Check labs today Follow up after neurocognitive testing  RECOMMENDATIONS FOR ALL PATIENTS WITH MEMORY PROBLEMS: 1. Continue to exercise (Recommend 30 minutes of walking everyday, or 3 hours every week) 2. Increase social interactions - continue going to [place] and enjoy social gatherings with friends and family 3. Eat healthy, avoid fried foods and eat more fruits and vegetables 4. Maintain adequate blood pressure, blood sugar, and blood cholesterol level. Reducing the risk of stroke and cardiovascular disease also helps promoting better memory. 5. Avoid stressful situations. Live a simple life and avoid aggravations. Organize your time and prepare for the next day in anticipation. 6. Sleep well, avoid any interruptions of sleep and avoid any distractions in the bedroom that may interfere with adequate sleep quality 7. Avoid sugar, avoid sweets as there is a strong link between excessive sugar intake, diabetes, and cognitive impairment We discussed the Mediterranean diet, which has been shown to help patients reduce the risk of progressive memory disorders and reduces cardiovascular risk. This includes eating fish, eat fruits and green leafy vegetables, nuts like almonds and hazelnuts, walnuts, and also use olive oil. Avoid fast foods and fried foods as much as possible. Avoid sweets and sugar as sugar use has been linked to worsening of memory function.  There is always a concern of gradual progression of memory problems. If this is the case, then we may need to adjust level of care according to patient needs. Support, both to the patient and caregiver, should then be put into place.       You have been referred for a neuropsychological evaluation (i.e., evaluation of memory and thinking abilities). Please bring someone with you to this appointment if possible, as it is helpful for the doctor to hear from both you and another adult who knows you well. Please bring eyeglasses and hearing aids if you wear them.    The evaluation will take approximately 3 hours and has two parts:   The first part is a clinical interview with the neuropsychologist (Dr. Melvyn Novas or Dr. Nicole Kindred). During the interview, the neuropsychologist will speak with you and the individual you brought to the appointment.    The second part of the evaluation is testing with the doctor's technician Hinton Dyer or Maudie Mercury). During the testing, the technician will ask you to remember different types of material, solve problems, and answer some questionnaires. Your family member will not be present for this portion of the evaluation.   Please note: We must reserve several hours of the neuropsychologist's time and the psychometrician's time for your evaluation appointment. As such, there is a No-Show fee of $100. If you are unable to attend any of your appointments, please contact our office as soon as possible to reschedule.    FALL PRECAUTIONS: Be cautious when walking. Scan the area for obstacles that may increase the risk of trips and falls. When getting up in the mornings, sit up at the edge of the bed for a few minutes before getting out of bed. Consider elevating the bed at the head end to avoid drop of blood pressure when getting up. Walk always in a well-lit room (use night lights in  the walls). Avoid area rugs or power cords from appliances in the middle of the walkways. Use a walker or a cane if necessary and consider physical therapy for balance exercise. Get your eyesight checked regularly.  FINANCIAL OVERSIGHT: Supervision, especially oversight when making financial decisions or transactions is also  recommended.  HOME SAFETY: Consider the safety of the kitchen when operating appliances like stoves, microwave oven, and blender. Consider having supervision and share cooking responsibilities until no longer able to participate in those. Accidents with firearms and other hazards in the house should be identified and addressed as well.   ABILITY TO BE LEFT ALONE: If patient is unable to contact 911 operator, consider using LifeLine, or when the need is there, arrange for someone to stay with patients. Smoking is a fire hazard, consider supervision or cessation. Risk of wandering should be assessed by caregiver and if detected at any point, supervision and safe proof recommendations should be instituted.  MEDICATION SUPERVISION: Inability to self-administer medication needs to be constantly addressed. Implement a mechanism to ensure safe administration of the medications.   DRIVING: Regarding driving, in patients with progressive memory problems, driving will be impaired. We advise to have someone else do the driving if trouble finding directions or if minor accidents are reported. Independent driving assessment is available to determine safety of driving.   If you are interested in the driving assessment, you can contact the following:  [employer] in [place]  [place] [phone] or [phone]    [place] refers to food and lifestyle choices that are based on the traditions of countries located on the [employer]. This way of eating has been shown to help prevent certain conditions and improve outcomes for people who have chronic diseases, like kidney disease and heart disease. What are tips for following this plan? Lifestyle  Cook and eat meals together with your family, when possible. Drink enough fluid to keep your urine clear  or pale yellow. Be physically active every day. This includes: Aerobic exercise like running or swimming. Leisure activities like gardening, walking, or housework. Get 7-8 hours of sleep each night. If recommended by your health care provider, drink red wine in moderation. This means 1 glass a day for nonpregnant women and 2 glasses a day for men. A glass of wine equals 5 oz (150 mL). Reading food labels  Check the serving size of packaged foods. For foods such as rice and pasta, the serving size refers to the amount of cooked product, not dry. Check the total fat in packaged foods. Avoid foods that have saturated fat or trans fats. Check the ingredients list for added sugars, such as corn syrup. Shopping  At the grocery store, buy most of your food from the areas near the walls of the store. This includes: Fresh fruits and vegetables (produce). Grains, beans, nuts, and seeds. Some of these may be available in unpackaged forms or large amounts (in bulk). Fresh seafood. Poultry and eggs. Low-fat dairy products. Buy whole ingredients instead of prepackaged foods. Buy fresh fruits and vegetables in-season from local farmers markets. Buy frozen fruits and vegetables in resealable bags. If you do not have access to quality fresh seafood, buy precooked frozen shrimp or canned fish, such as tuna, salmon, or sardines. Buy small amounts of raw or cooked vegetables, salads, or olives from the deli or salad bar at your store. Stock Conservator, museum/gallery so  you always have certain foods on hand, such as olive oil, canned tuna, canned tomatoes, rice, pasta, and beans. Cooking  Cook foods with extra-virgin olive oil instead of using butter or other vegetable oils. Have meat as a side dish, and have vegetables or grains as your main dish. This means having meat in small portions or adding small amounts of meat to foods like pasta or stew. Use beans or vegetables instead of meat in common dishes like chili or  lasagna. Experiment with different cooking methods. Try roasting or broiling vegetables instead of steaming or sauteing them. Add frozen vegetables to soups, stews, pasta, or rice. Add nuts or seeds for added healthy fat at each meal. You can add these to yogurt, salads, or vegetable dishes. Marinate fish or vegetables using olive oil, lemon juice, garlic, and fresh herbs. Meal planning  Plan to eat 1 vegetarian meal one day each week. Try to work up to 2 vegetarian meals, if possible. Eat seafood 2 or more times a week. Have healthy snacks readily available, such as: Vegetable sticks with hummus. Greek yogurt. Fruit and nut trail mix. Eat balanced meals throughout the week. This includes: Fruit: 2-3 servings a day Vegetables: 4-5 servings a day Low-fat dairy: 2 servings a day Fish, poultry, or lean meat: 1 serving a day Beans and legumes: 2 or more servings a week Nuts and seeds: 1-2 servings a day Whole grains: 6-8 servings a day Extra-virgin olive oil: 3-4 servings a day Limit red meat and sweets to only a few servings a month What are my food choices? Mediterranean diet Recommended Grains: Whole-grain pasta. Brown rice. Bulgar wheat. Polenta. Couscous. Whole-wheat bread. [place]. Vegetables: Artichokes. Beets. Broccoli. Cabbage. Carrots. Eggplant. Green beans. Chard. Kale. Spinach. Onions. Leeks. Peas. Squash. Tomatoes. Peppers. Radishes. Fruits: Apples. Apricots. Avocado. Berries. Bananas. Cherries. Dates. Figs. Grapes. Lemons. Melon. Oranges. Peaches. Plums. Pomegranate. Meats and other protein foods: Beans. Almonds. Sunflower seeds. Pine nuts. Peanuts. [place]. Salmon. Scallops. Shrimp. [place]. Tilapia. Clams. Oysters. Eggs. Dairy: Low-fat milk. Cheese. Greek yogurt. Beverages: Water. Red wine. Herbal tea. Fats and oils: Extra virgin olive oil. Avocado oil. Grape seed oil. Sweets and desserts: [place] yogurt with honey. Baked apples. Poached pears. Trail mix. Seasoning and  other foods: Basil. Cilantro. Coriander. Cumin. Mint. Parsley. Sage. Rosemary. Tarragon. Garlic. Oregano. Thyme. Pepper. Balsalmic vinegar. Tahini. Hummus. Tomato sauce. Olives. Mushrooms. Limit these Grains: Prepackaged pasta or rice dishes. Prepackaged cereal with added sugar. Vegetables: Deep fried potatoes (french fries). Fruits: Fruit canned in syrup. Meats and other protein foods: Beef. Pork. Lamb. Poultry with skin. Hot dogs. [place] Salines. Dairy: Ice cream. Sour cream. Whole milk. Beverages: Juice. Sugar-sweetened soft drinks. Beer. Liquor and spirits. Fats and oils: Butter. Canola oil. Vegetable oil. Beef fat (tallow). Lard. Sweets and desserts: Cookies. Cakes. Pies. Candy. Seasoning and other foods: Mayonnaise. Premade sauces and marinades. The items listed may not be a complete list. Talk with your dietitian about what dietary choices are right for you. Summary The Mediterranean diet includes both food and lifestyle choices. Eat a variety of fresh fruits and vegetables, beans, nuts, seeds, and whole grains. Limit the amount of red meat and sweets that you eat. Talk with your health care provider about whether it is safe for you to drink red wine in moderation. This means 1 glass a day for nonpregnant women and 2 glasses a day for men. A glass of wine equals 5 oz (150 mL). This information is not intended to replace advice given to you by  your health care provider. Make sure you discuss any questions you have with your health care provider. Document Released: 02/22/2016 Document Revised: 03/26/2016 Document Reviewed: 02/22/2016 Elsevier Interactive Patient Education  2017 [employer].   We have sent a referral to [facility] for your MRI and they will call you directly to schedule your appointment. They are located at [place]. If you need to contact them directly please call [phone].   Your provider has requested that you have labwork completed today. Please go to  [facility] ([id number]) on the second floor of this building before leaving the office today. You do not need to check in. If you are not called within 15 minutes please check with the front desk.

## 2021-04-12 NOTE — Progress Notes (Signed)
No answer at 325 04/12/2021

## 2021-04-15 LAB — VITAMIN B1: Vitamin B1 (Thiamine): 7 nmol/L — ABNORMAL LOW (ref 8–30)

## 2021-04-20 NOTE — Progress Notes (Signed)
No ICM remote transmission received for 04/16/2021 and next ICM transmission scheduled for 05/14/2021.

## 2021-05-07 ENCOUNTER — Other Ambulatory Visit: Payer: Self-pay | Admitting: Cardiology

## 2021-05-14 ENCOUNTER — Ambulatory Visit (INDEPENDENT_AMBULATORY_CARE_PROVIDER_SITE_OTHER): Payer: Medicare PPO

## 2021-05-14 DIAGNOSIS — Z95810 Presence of automatic (implantable) cardiac defibrillator: Secondary | ICD-10-CM

## 2021-05-14 DIAGNOSIS — I5022 Chronic systolic (congestive) heart failure: Secondary | ICD-10-CM

## 2021-05-14 NOTE — Progress Notes (Signed)
EPIC Encounter for ICM Monitoring  Patient Name: Anthony Daugherty is a 75 y.o. male Date: 05/14/2021 Primary Care Physican: Tammi Sou, MD Primary Cardiologist: Radford Pax Electrophysiologist: Allred Bi-V Pacing: 99%         04/03/2021 Office Weight: 164 lbs                                                            Transmission reviewed.    CorVue thoracic impedance normal.   Prescribed: Spironolactone 25 mg take 0.5 tablet daily.   Labs: 04/03/2021 Creatinine 0.87, BUN 12, Potassium 3.9, Sodium 140, GFR 84.95 09/29/2020 Creatinine 1.02, BUN 16, Potassium 4.2, Sodium 142, GFR 72.62 A complete set of results can be found in Results Review.   Recommendations: No changes   Follow-up plan: ICM clinic phone appointment on 06/18/2021.   91 day device clinic remote transmission 05/31/2021.   EP/Cardiology Office Visits: 06/20/2021 with Dr. Radford Pax.  Recall 12/18/2021 with Dr Rayann Heman.     Copy of ICM check sent to Dr. Rayann Heman.     3 month ICM trend: 05/14/2021.    1 Year ICM trend:       Rosalene Billings, RN 05/14/2021 4:59 PM

## 2021-05-31 ENCOUNTER — Ambulatory Visit (INDEPENDENT_AMBULATORY_CARE_PROVIDER_SITE_OTHER): Payer: Medicare PPO

## 2021-05-31 DIAGNOSIS — I447 Left bundle-branch block, unspecified: Secondary | ICD-10-CM

## 2021-05-31 DIAGNOSIS — I255 Ischemic cardiomyopathy: Secondary | ICD-10-CM | POA: Diagnosis not present

## 2021-05-31 LAB — CUP PACEART REMOTE DEVICE CHECK
Battery Remaining Longevity: 71 mo
Battery Remaining Percentage: 76 %
Battery Voltage: 2.96 V
Brady Statistic AP VP Percent: 39 %
Brady Statistic AP VS Percent: 1 %
Brady Statistic AS VP Percent: 59 %
Brady Statistic AS VS Percent: 1 %
Brady Statistic RA Percent Paced: 38 %
Date Time Interrogation Session: 20221117010538
HighPow Impedance: 55 Ohm
Implantable Lead Implant Date: 20201117
Implantable Lead Implant Date: 20201117
Implantable Lead Implant Date: 20201117
Implantable Lead Location: 753858
Implantable Lead Location: 753859
Implantable Lead Location: 753860
Implantable Pulse Generator Implant Date: 20201117
Lead Channel Impedance Value: 390 Ohm
Lead Channel Impedance Value: 400 Ohm
Lead Channel Impedance Value: 660 Ohm
Lead Channel Pacing Threshold Amplitude: 0.75 V
Lead Channel Pacing Threshold Amplitude: 1 V
Lead Channel Pacing Threshold Amplitude: 1.125 V
Lead Channel Pacing Threshold Pulse Width: 0.5 ms
Lead Channel Pacing Threshold Pulse Width: 0.5 ms
Lead Channel Pacing Threshold Pulse Width: 0.5 ms
Lead Channel Sensing Intrinsic Amplitude: 1.7 mV
Lead Channel Sensing Intrinsic Amplitude: 11.8 mV
Lead Channel Setting Pacing Amplitude: 1.625
Lead Channel Setting Pacing Amplitude: 1.75 V
Lead Channel Setting Pacing Amplitude: 2 V
Lead Channel Setting Pacing Pulse Width: 0.5 ms
Lead Channel Setting Pacing Pulse Width: 0.5 ms
Lead Channel Setting Sensing Sensitivity: 0.5 mV
Pulse Gen Serial Number: 111012743

## 2021-06-11 NOTE — Progress Notes (Signed)
Remote ICD transmission.   

## 2021-06-18 ENCOUNTER — Ambulatory Visit (INDEPENDENT_AMBULATORY_CARE_PROVIDER_SITE_OTHER): Payer: Medicare PPO

## 2021-06-18 DIAGNOSIS — I5022 Chronic systolic (congestive) heart failure: Secondary | ICD-10-CM | POA: Diagnosis not present

## 2021-06-18 DIAGNOSIS — Z95810 Presence of automatic (implantable) cardiac defibrillator: Secondary | ICD-10-CM

## 2021-06-20 ENCOUNTER — Ambulatory Visit (HOSPITAL_COMMUNITY): Payer: Medicare PPO | Attending: Cardiology

## 2021-06-20 ENCOUNTER — Other Ambulatory Visit: Payer: Self-pay

## 2021-06-20 ENCOUNTER — Ambulatory Visit: Payer: Medicare PPO | Admitting: Cardiology

## 2021-06-20 DIAGNOSIS — I351 Nonrheumatic aortic (valve) insufficiency: Secondary | ICD-10-CM | POA: Insufficient documentation

## 2021-06-20 LAB — ECHOCARDIOGRAM COMPLETE
AR max vel: 1.58 cm2
AV Area VTI: 1.59 cm2
AV Area mean vel: 1.39 cm2
AV Mean grad: 12 mmHg
AV Peak grad: 21.3 mmHg
Ao pk vel: 2.31 m/s
Area-P 1/2: 2.55 cm2
P 1/2 time: 463 msec
S' Lateral: 5 cm

## 2021-06-22 ENCOUNTER — Telehealth: Payer: Self-pay

## 2021-06-22 NOTE — Progress Notes (Signed)
EPIC Encounter for ICM Monitoring  Patient Name: Anthony Daugherty is a 76 y.o. male Date: 06/22/2021 Primary Care Physican: Tammi Sou, MD Primary Cardiologist: Radford Pax Electrophysiologist: Allred Bi-V Pacing: 99%         04/03/2021 Office Weight: 164 lbs                                                            Attempted call to patient and unable to reach.  Left detailed message per DPR regarding transmission. Transmission reviewed.     CorVue thoracic impedance suggesting possible fluid accumulation but trending back toward baseline.   Prescribed: Spironolactone 25 mg take 0.5 tablet daily.   Labs: 04/03/2021 Creatinine 0.87, BUN 12, Potassium 3.9, Sodium 140, GFR 84.95 09/29/2020 Creatinine 1.02, BUN 16, Potassium 4.2, Sodium 142, GFR 72.62 A complete set of results can be found in Results Review.   Recommendations: Left voice mail with ICM number and encouraged to call if experiencing any fluid symptoms.   Follow-up plan: ICM clinic phone appointment on 07/02/2021 to recheck fluid levels.   91 day device clinic remote transmission 08/30/2021.   EP/Cardiology Office Visits: 07/27/2021 with Dr. Radford Pax.  Recall 12/18/2021 with Dr Rayann Heman.     Copy of ICM check sent to Dr. Rayann Heman.   Will send to Dr Radford Pax for review if patient is reached.  3 month ICM trend: 06/18/2021.    12-14 Month ICM trend:       Rosalene Billings, RN 06/22/2021 9:23 AM

## 2021-06-22 NOTE — Telephone Encounter (Signed)
Remote ICM transmission received.  Attempted call to patient regarding ICM remote transmission and left detailed message per DPR.  Advised to return call for any fluid symptoms or questions. Next ICM remote transmission scheduled 07/02/2021.

## 2021-07-02 ENCOUNTER — Ambulatory Visit (INDEPENDENT_AMBULATORY_CARE_PROVIDER_SITE_OTHER): Payer: Medicare PPO

## 2021-07-02 DIAGNOSIS — Z95810 Presence of automatic (implantable) cardiac defibrillator: Secondary | ICD-10-CM

## 2021-07-02 DIAGNOSIS — I5022 Chronic systolic (congestive) heart failure: Secondary | ICD-10-CM

## 2021-07-04 ENCOUNTER — Telehealth: Payer: Self-pay

## 2021-07-04 NOTE — Telephone Encounter (Signed)
Pt's wife called stating that she tested positive for COVID and wanted pt to get Rx for pt. Pt currently does not have any symptoms but she wants to have Rx on hand incase he starts to have complications over the holidays. Pt has CHF and wife is concerned that he may get COVID as well while office is closed. Advised pt of MyChart urgent care if not able or wanting to take pt to UC in person. Also told her that if she were to call after office hours the call center/nurse would guide her in what her next steps were if the office is closed. Told wife that it is not likely that Rx will be sent due to no appt and no symptoms or positive test on pt.

## 2021-07-04 NOTE — Progress Notes (Signed)
EPIC Encounter for ICM Monitoring  Patient Name: Anthony Daugherty is a 75 y.o. male Date: 07/04/2021 Primary Care Physican: Tammi Sou, MD Primary Cardiologist: Radford Pax Electrophysiologist: Allred Bi-V Pacing: 99%         04/03/2021 Office Weight: 164 lbs                                                            Transmission reviewed.     CorVue thoracic impedance suggesting fluid levels returned to normal.   Prescribed: Spironolactone 25 mg take 0.5 tablet daily.   Labs: 04/03/2021 Creatinine 0.87, BUN 12, Potassium 3.9, Sodium 140, GFR 84.95 09/29/2020 Creatinine 1.02, BUN 16, Potassium 4.2, Sodium 142, GFR 72.62 A complete set of results can be found in Results Review.   Recommendations: No changes.   Follow-up plan: ICM clinic phone appointment on 07/25/2021.   91 day device clinic remote transmission 08/30/2021.   EP/Cardiology Office Visits: 07/27/2021 with Dr. Radford Pax.  Recall 12/18/2021 with Dr Rayann Heman.     Copy of ICM check sent to Dr. Rayann Heman.   3 month ICM trend: 07/02/2021.    12-14 Month ICM trend:       Rosalene Billings, RN 07/04/2021 3:51 PM

## 2021-07-05 NOTE — Telephone Encounter (Signed)
I agree with all of what you said and I have nothing to add.--thx

## 2021-07-05 NOTE — Telephone Encounter (Signed)
noted 

## 2021-07-05 NOTE — Telephone Encounter (Signed)
note

## 2021-07-10 ENCOUNTER — Telehealth: Payer: Self-pay | Admitting: Family Medicine

## 2021-07-10 MED ORDER — NIRMATRELVIR/RITONAVIR (PAXLOVID)TABLET: 3.0000 | ORAL_TABLET | Freq: Two times a day (BID) | ORAL | 0 refills | Status: AC

## 2021-07-10 NOTE — Telephone Encounter (Signed)
Paxlovid eRx'd See telephone note I sent you.

## 2021-07-10 NOTE — Telephone Encounter (Signed)
Patient wife calling because patient tested positive for COVID today.  She cannot remember the exact date the symptoms started but she said probably yesterday because his symptoms were worse yesterday.  She is requesting PAXLOVID called in for her husband because of his CHF.  Please call [phone].  Pharmacy is [facility].

## 2021-07-10 NOTE — Telephone Encounter (Signed)
(  Pt with resp illness x 2d, wife covid pos today. Pt's RF's for complication from covid:  age > 98, CHF, BMI 27. Pt is w/in treatment window for antiviral. Paxlovid eRx'd.)  Britt, tell pt that he should not take colchicine when he is taking the paxlovid.

## 2021-07-10 NOTE — Telephone Encounter (Signed)
1 hour ago (12:19 PM)   (Pt with resp illness x 2d, wife covid pos today. Pt's RF's for complication from covid:  age > 29, CHF, BMI 27. Pt is w/in treatment window for antiviral. Paxlovid eRx'd.)   Britt, tell pt that he should not take colchicine when he is taking the paxlovid.

## 2021-07-10 NOTE — Telephone Encounter (Signed)
Pt's wife, Thayer Headings advised of rx instructions.

## 2021-07-10 NOTE — Telephone Encounter (Signed)
Spoke with pt regarding symptoms, he was not able to recall exact time frame of when it started because he did not know or feel like he had any. Current symptoms: head congestion, dry cough off/on and runny nose (1 day).   Please review and advise

## 2021-07-16 ENCOUNTER — Other Ambulatory Visit: Payer: Self-pay | Admitting: Physician Assistant

## 2021-07-25 ENCOUNTER — Ambulatory Visit (INDEPENDENT_AMBULATORY_CARE_PROVIDER_SITE_OTHER): Payer: Medicare PPO

## 2021-07-25 DIAGNOSIS — I5022 Chronic systolic (congestive) heart failure: Secondary | ICD-10-CM | POA: Diagnosis not present

## 2021-07-25 DIAGNOSIS — Z95810 Presence of automatic (implantable) cardiac defibrillator: Secondary | ICD-10-CM | POA: Diagnosis not present

## 2021-07-27 ENCOUNTER — Other Ambulatory Visit: Payer: Self-pay

## 2021-07-27 ENCOUNTER — Telehealth (INDEPENDENT_AMBULATORY_CARE_PROVIDER_SITE_OTHER): Payer: Medicare PPO | Admitting: Cardiology

## 2021-07-27 ENCOUNTER — Encounter: Payer: Self-pay | Admitting: Cardiology

## 2021-07-27 VITALS — BP 158/77 | HR 65 | Ht 65.0 in | Wt 170.0 lb

## 2021-07-27 DIAGNOSIS — I10 Essential (primary) hypertension: Secondary | ICD-10-CM | POA: Diagnosis not present

## 2021-07-27 DIAGNOSIS — I2510 Atherosclerotic heart disease of native coronary artery without angina pectoris: Secondary | ICD-10-CM

## 2021-07-27 DIAGNOSIS — I359 Nonrheumatic aortic valve disorder, unspecified: Secondary | ICD-10-CM

## 2021-07-27 DIAGNOSIS — I5042 Chronic combined systolic (congestive) and diastolic (congestive) heart failure: Secondary | ICD-10-CM | POA: Diagnosis not present

## 2021-07-27 DIAGNOSIS — E7800 Pure hypercholesterolemia, unspecified: Secondary | ICD-10-CM

## 2021-07-27 DIAGNOSIS — I77810 Thoracic aortic ectasia: Secondary | ICD-10-CM | POA: Diagnosis not present

## 2021-07-27 DIAGNOSIS — I255 Ischemic cardiomyopathy: Secondary | ICD-10-CM | POA: Diagnosis not present

## 2021-07-27 MED ORDER — ENTRESTO 97-103 MG PO TABS: 1.0000 | ORAL_TABLET | Freq: Two times a day (BID) | ORAL | 3 refills | Status: DC

## 2021-07-27 NOTE — Patient Instructions (Addendum)
Medication Instructions:  Your physician has recommended you make the following change in your medication:  1) INCREASE Entresto to 97-103 mg twice daily  *If you need a refill on your cardiac medications before your next appointment, please call your pharmacy*   Lab Work: BMET in one week  If you have labs (blood work) drawn today and your tests are completely normal, you will receive your results only by: [place] (if you have MyChart) OR A paper copy in the mail If you have any lab test that is abnormal or we need to change your treatment, we will call you to review the results.   Testing/Procedures: Your physician has requested that you have an echocardiogram in June 2023. Echocardiography is a painless test that uses sound waves to create images of your heart. It provides your doctor with information about the size and shape of your heart and how well your hearts chambers and valves are working. This procedure takes approximately one hour. There are no restrictions for this procedure.  Your provider has requested that you have a chest CTA scan.   Follow-Up: At [facility], you and your health needs are our priority.  As part of our continuing mission to provide you with exceptional heart care, we have created designated Provider Care Teams.  These Care Teams include your primary Cardiologist (physician) and Advanced Practice Providers (APPs -  Physician Assistants and Nurse Practitioners) who all work together to provide you with the care you need, when you need it.  Follow up with Dr. Burt Knack in 6 months   Your next appointment:   1 year(s)  The format for your next appointment:   In Person  Provider:   Fransico Him, MD    Other Instructions Follow up with PharmD in the Hypertension Clinic in one week.

## 2021-07-27 NOTE — Progress Notes (Addendum)
Virtual Visit via Video Note   This visit type was conducted due to national recommendations for restrictions regarding the COVID-19 Pandemic (e.g. social distancing) in an effort to limit this patient's exposure and mitigate transmission in our community.  Due to his co-morbid illnesses, this patient is at least at moderate risk for complications without adequate follow up.  This format is felt to be most appropriate for this patient at this time.  All issues noted in this document were discussed and addressed.  A limited physical exam was performed with this format.  Please refer to the patient's chart for his consent to telehealth for [facility].   Evaluation Performed:  Follow-up visit   Date:  07/27/2021   ID:  Anthony Daugherty, Anthony Daugherty 03-02-1946, MRN [id number]  Patient Location:  [facility]  Provider location:   [place]  PCP:  Tammi Sou, MD  Cardiologist:  Fransico Him, MD  Electrophysiologist:  [name], MD   Chief Complaint:  DCM and AI  History of Present Illness: He   Anthony Daugherty is a 76 y.o. male who presents via audio/video conferencing for a telehealth visit today.    This is a 76yo male with a history of aortic stenosis and AR with dilated aortic root.  He apparently had an echo 07/2017 showing mild aortic stenosis and mild to moderate AR and aortic root 41mm.  He also has a history of HTN, hyperlipidemia, nonischemic DCM with EF 40-45 by cath and echo in 2012 and ASCAD with 80% OM lesion medically managed.    Repeat echo 08/2018 showed moderate AR with aortic root 32mm.  Unfortunately his echo also shows that his LVF has significantly declined with EF 20-25% with mildly dilated LV and diffuse HK.  There was severe LAE and moderate RAE and mild pulmonary HTN. At his initial OV in 02/1447, his Bystolic was changed to Carvedilol and he was continued on ARB.    He underwent cath 12/2018 showing 95% pRCA, 75% OM1 and 50% prox to mid LAD.  He underwent  attempted PCI of the RCA but unable to cross the lesion and medical therapy was recommended.  It was felt that his CM was mixed ICM/NICM with possible ETOH contribution as well.  He has seen my extenders several times trying to titrate HF meds but has been limited by soft Bps.  He was on Entresto 49/51mg  BID, Carvedilol 25mg  BID and repeat echo 04/2019 showed EF 25-30% with mild AS and moderate to severe AI.  There was no M spike on SPEP/UPEP and TSH and ferritin were normal.  He was referred to EP and underwent [employer] BiV ICD on 06/01/2019.    2D echo 05/2020 showed improved LVF after BiVICD with EF 45-50%, mild AS and moderate AR, mild MR. He was seen by Dr. Burt Knack in June 2022 for moderate to severe aortic insufficiency with mildly dilated aortic root.  It was felt that his overall LV dimensions have been relatively stable after his EF improved with cardiac resynchronization therapy and a period of observation was recommended.  It was felt that given his multivessel coronary disease, if he did require aortic valve replaced in the future he would likely need combined CABG/AVR.  Recent 2D echocardiogram showed stable normal LV systolic function with moderate to severe AI.  I reviewed I reviewed the findings with Dr. Burt Knack and he would like to see the patient back in June 2023 along with a repeat echo at that time unless he  becomes symptomatic earlier.  He is here today for followup and is doing well.  He denies any chest pain or pressure, SOB, DOE, PND, orthopnea, LE edema, dizziness, palpitations or syncope.  He is compliant with his meds and is tolerating meds with no SE.     The patient does not have symptoms concerning for COVID-19 infection (fever, chills, cough, or new shortness of breath).    Prior CV studies:   The following studies were reviewed today:  2D echo 05/2020 IMPRESSIONS     1. Left ventricular ejection fraction, by estimation, is 45 to 50%. The  left ventricle has mildly  decreased function. The left ventricle has no  regional wall motion abnormalities. There is severe left ventricular  hypertrophy of the basal-septal segment.   Left ventricular diastolic parameters are consistent with Grade I  diastolic dysfunction (impaired relaxation).   2. Right ventricular systolic function is normal. The right ventricular  size is normal. There is normal pulmonary artery systolic pressure. The  estimated right ventricular systolic pressure is 91.6 mmHg.   3. Left atrial size was severely dilated.   4. The mitral valve is degenerative. Mild mitral valve regurgitation. No  evidence of mitral stenosis.   5. The aortic valve is calcified. There is moderate calcification of the  aortic valve. There is moderate thickening of the aortic valve. Aortic  valve regurgitation is moderate and eccentrically directed towards the  anterior MV leaflet with early closure   of the AMVL due to AR jet. Mild aortic valve stenosis. Aortic  regurgitation PHT measures 463 msec. Aortic valve area, by VTI measures  1.04 cm. Aortic valve mean gradient measures 10.0 mmHg. Aortic valve Vmax  measures 2.13 m/s. Unable to accurately  measure vena contracta or PHT due to eccentric jet.   6. Aortic dilatation noted. There is mild dilatation of the ascending  aorta, measuring 38 mm.   7. The inferior vena cava is normal in size with greater than 50%  respiratory variability, suggesting right atrial pressure of 3 mmHg.   8. Compared to prior echo, LVESD is normal at 73mm and LVEDD 47mm. There  is at least moderate aortic insufficiency that is directed eccentrically  towards the AMVL with early closure of the MV. No change in the mean AVG  from prior study but DI has  decreased.    Past Medical History:  Diagnosis Date   Aortic regurgitation    Mild-mod on echo 07/2017.  Moderate 08/2018.  Moderate-severe on echo 04/2019--> TEE 11/15/19 EF 50-55%, no LV dilation or WMA. Mod AV sclerosis w/out  stenosis, mod-to-sev AI-.>rpt echo 6 mo. 11/2020 moderate AI   Aortic root dilatation ([place]) 10/2018   11/17/19 45 mm aortic root at the sinuses of Valsalva->Dr. Radford Pax referred pt to CT surgeon. 52022 echo->40 mm   Aortic stenosis, mild    Arthritis    L-Spine   CARDIAC MURMUR 38/46/6599; 3570   Systolic and diastolic as of 07/7791.   Cardiomyopathy ([place]) 10/12/2010   Global hypokinesis, EF 40-45% by cath and echo (2012 echo and 2019 echo).  Feb 2020 EF 20-25%. - mixed ICM/NICM (alcohol and CAD)  October 2020 EF 25-30%. 11/2020 EF 40-45%.   Chronic combined systolic and diastolic CHF (congestive heart failure) (HCC)    max med mgmt, plus CRT-D placed 05/2019, EF improved to 50% after.   Coronary artery disease 11/16/2010   a. 2012 - 80% OM lesion. b. Cath 12/2018 - showing 95% prox RCA, 75% OM1, 50% prox-mid  LAD. Unsuccessful PCI to 100% chronically occluded RCA summer 2020.   Diverticulosis    Erectile dysfunction 08/16/2011   ESSENTIAL HYPERTENSION 07/19/2010   Qualifier: Diagnosis of  By: Anitra Lauth M.D., Brien Few    GAD (generalized anxiety disorder)    managed by Dr. Toy Care with prozac 20mg  qd   Gout    Usually Right great toe; colchicine helps well   Habitual alcohol use    Hx of colonoscopy 2004; 2014   Initial was normal; 2014-polypectomy and diverticulosis.  Recall 2019.   Hyperlipidemia 10/23/2012   LBBB (left bundle branch block)    CRT-D placed 05/2019   Low back strain 10/24/2013   Obesity, Class I, BMI 30-34.9    Pulmonary nodule    0.5 cm RLL subpleural nodule.  Pt NEVER SMOKER.  Low risk->no f/u imaging is needed.   Thyroid nodule 04/23/2013   Past Surgical History:  Procedure Laterality Date   BIV ICD INSERTION CRT-D N/A 06/01/2019   Procedure: BIV ICD INSERTION CRT-D;  Surgeon: [name], MD;  Location: [place] [facility];  Service: Cardiovascular;  Laterality: N/A;   CARDIAC CATHETERIZATION  10/12/10   diagnostic only: 1 vessel CAD with TIMI 3 flow, EF 40-45%.   Mild AS.  Medical mgmt of CAD.   COLONOSCOPY     COLONOSCOPY W/ POLYPECTOMY  10/06/12   Diverticulosis and tubular adenoma w/out high grade dysplasia.  Repeat 5 yrs (Dr. Deatra Ina).   CORONARY ATHERECTOMY N/A 01/08/2019   100% chronic RCA occlusion impossible to clear.  Med mgmt. Procedure: CORONARY ATHERECTOMY;  Surgeon: Sherren Mocha, MD;  Location: [facility];  Service: Cardiovascular;  Laterality: N/A;   POLYPECTOMY     RIGHT HEART CATH AND CORONARY ANGIOGRAPHY N/A 12/24/2018   95% RCA occlusion, 75% obtuse marginal occlusion (3V dz). Procedure: RIGHT HEART CATH AND CORONARY ANGIOGRAPHY;  Surgeon: Lorretta Harp, MD;  Location: [place] [facility];  Service: Cardiovascular;  Laterality: N/A;   TEE WITHOUT CARDIOVERSION N/A 11/15/2019   EF 50-55%, no LV dilation or WMA. Mod AV sclerosis w/out stenosis, mod-to-sev AI.  Procedure: TRANSESOPHAGEAL ECHOCARDIOGRAM (TEE);  Surgeon: Fay Records, MD;  Location: [facility];  Service: Cardiovascular;  Laterality: N/A;   TRANSTHORACIC ECHOCARDIOGRAM  09/2010; 07/2017; 08/2018; 10/2019;05/2020   2012:  EF 40-45%, global hypokinesis, mild AS.  07/2017: no interval chg except mild/mod AR.  08/2018 EF 20-25%, global LV hypok, DD, biatrial enlargemt, mod AR, aortic root 3.8 cm. 10/2019 EF 50%, mild dec RV fxn, mod-sev AR, aor root dil 42 mm. 05/2020 EF 45-50%,grd I DD, mod AI, aorta 24mm, severe LAE. 10/2020 EF 45-50%, mild LVH, global hypokin, grd I DD, mod AI.     Current Meds  Medication Sig   atorvastatin (LIPITOR) 40 MG tablet Take 1 tablet (40 mg total) by mouth daily. Please keep upcoming appt with Dr. Radford Pax in December 2022 before anymore refills. Thank you   carvedilol (COREG) 25 MG tablet TAKE  (1)  TABLET TWICE A DAY.   colchicine 0.6 MG tablet TAKE TWO TABLETS AT ONSET OF GOUT PAIN, REPEAT IN 1 HOUR, THEN 1 TABLET TWICE A DAY UNTIL RESOLVED   donepezil (ARICEPT) 10 MG tablet Take half tablet (5 mg) daily for 2 weeks, then increase to the full  tablet at 10 mg daily   ENTRESTO 49-51 MG TAKE  (1)  TABLET TWICE A DAY.   FLUoxetine (PROZAC) 20 MG capsule Take 20 mg by mouth daily.    hydrochlorothiazide (HYDRODIURIL) 25 MG  tablet Take 25 mg by mouth daily.   spironolactone (ALDACTONE) 25 MG tablet Take 0.5 tablets (12.5 mg total) by mouth daily.     Allergies:   Penicillins   Social History   Tobacco Use   Smoking status: Former   Smokeless tobacco: Current    Types: Chew   Tobacco comments:    chews tobacco  Vaping Use   Vaping Use: Never used  Substance Use Topics   Alcohol use: Yes    Alcohol/week: 28.0 standard drinks    Types: 28 Cans of beer per week    Comment: daily   Drug use: No     Family Hx: The patient's family history includes Alzheimer's disease in his mother; COPD in his father; Congenital heart disease in his brother; Heart failure in his father. There is no history of Colon cancer, Esophageal cancer, Stomach cancer, Rectal cancer, or Colon polyps.  ROS:   Please see the history of present illness.     All other systems reviewed and are negative.   Labs/Other Tests and Data Reviewed:    Recent Labs: 09/29/2020: Hemoglobin 13.7; Platelets 198.0 04/03/2021: ALT 10; BUN 12; Creatinine, Ser 0.87; Potassium 3.9; Sodium 140 04/12/2021: TSH 3.45   Recent Lipid Panel Lab Results  Component Value Date/Time   CHOL 132 04/03/2021 10:19 AM   CHOL 141 04/26/2019 09:12 AM   TRIG 57.0 04/03/2021 10:19 AM   TRIG 105 05/10/2008 12:00 AM   HDL 67.90 04/03/2021 10:19 AM   HDL 72 04/26/2019 09:12 AM   CHOLHDL 2 04/03/2021 10:19 AM   LDLCALC 53 04/03/2021 10:19 AM   LDLCALC 57 04/26/2019 09:12 AM   LDLCALC 101 05/10/2008 12:00 AM    Wt Readings from Last 3 Encounters:  07/27/21 170 lb (77.1 kg)  04/12/21 167 lb (75.8 kg)  04/03/21 164 lb (74.4 kg)     Objective:    Vital Signs:  BP (!) 158/77    Pulse 65    Ht 5\' 5"  (1.651 m)    Wt 170 lb (77.1 kg)    BMI 28.29 kg/m    Well nourished, well  developed male in no acute distress. Well appearing, alert and conversant, regular work of breathing,  good skin color  Eyes- anicteric mouth- oral mucosa is pink  neuro- grossly intact skin- no apparent rash or lesions or cyanosis   ASSESSMENT & PLAN:    1.  Severe mixed DCM -EF on last echo 05/2020 showed improved LVF with EF 45-50% after BiV AICD implant a year ago and EF continues to remain stable at 45 to 50% on echo 06/20/2021 -He will continue on prescription drug therapy with carvedilol 25 mg twice daily, Entresto twice daily and spironolactone 12.5 mg daily with as needed refills continue Entresto, Carvedilol and spiro  2.  Chronic combined systolic/diastolic CHF -he is NYHA class 2a -He has not had any shortness of breath or lower extremity edema and his weight continues to remain stable -Again continue prescription drug management with carvedilol 25 mg twice daily and spironolactone 12.5 mg daily with as needed refills -increase Entresto 97-103mg  BID for better BP control -I have personally reviewed and interpreted outside labs performed by patient's PCP which showed serum creatinine 0.87 and potassium 3.9  3.  Aortic valve disease -2D echo 06-2021 shows stable low normal LV function with EF 45 to 50% with moderate to severe eccentric AI -mild AS with mean AVG 12 mmHg by echo 12/22 -He is being followed in structural heart  clinic by Dr. Burt Knack who will see him in June 2023 with follow-up echo at that time or sooner if he becomes symptomatic -Currently he is asymptomatic -If his AI worsens or he becomes symptomatic he will likely need CABG/AVR given his multivessel CAD as well  4.  Mild ascending aortic and aortic root dilatation -Chest CTA measuring aortic root at SOV 27mm at SOV, 72mm in ascending aorta and abdominal CTA with aortic atherosclerosis but no AAA -Repeat chest CTA to make sure this is not progressed -continue statin and BP control  5.  ASCAD - cath 12/2018  showing 95% pRCA, 75% OM1 and 50% prox to mid LAD.  He underwent attempted PCI of the RCA but unable to cross the lesion and medical therapy was recommended. -He denies any anginal symptoms -Continue aspirin, beta-blocker, statin  6.  HLD -LDL goal < 70 -Continue prescription drug therapy with atorvastatin 40 mg daily with as needed refills -LDL was stable at 53 on 04/03/2021  7.  HTN -BP today was 164/53mmHg>>it has been running high at home -continue Carvedilol 25mg  BID and HCTZ 25mg  daily -increase Entresto to 97-103mg  BID for better BP control -Check BMET in [redacted] week along with Pharm D followup  The signs and symptoms of COVID-19 were discussed with the patient and how to seek care for testing (follow up with PCP or arrange E-visit).  The importance of social distancing was discussed today.  Patient Risk:   After full review of this patient's clinical status, I feel that they are at least moderate risk at this time.  Time:   Today, I have spent 20 minutes  on telemedicine discussing medical problems including CAD, HLD, DCM, CHF and reviewing patient's chart including chest CTA, 2D echo, labs.  Medication Adjustments/Labs and Tests Ordered: Current medicines are reviewed at length with the patient today.  Concerns regarding medicines are outlined above.  Tests Ordered: No orders of the defined types were placed in this encounter.  Medication Changes: No orders of the defined types were placed in this encounter.   Disposition:  Follow up in 1 year  Signed, Fransico Him, MD  07/27/2021 1:50 PM    [facility]

## 2021-07-27 NOTE — Addendum Note (Signed)
Addended by: Antonieta Iba on: 07/27/2021 02:18 PM   Modules accepted: Orders

## 2021-07-27 NOTE — Progress Notes (Signed)
EPIC Encounter for ICM Monitoring  Patient Name: Anthony Daugherty is a 76 y.o. male Date: 07/27/2021 Primary Care Physican: Tammi Sou, MD Primary Cardiologist: Radford Pax Electrophysiologist: Allred Bi-V Pacing: 99%         04/03/2021 Office Weight: 164 lbs                                                            Transmission reviewed and copy to Dr Radford Pax as Juluis Rainier for telehealth visit scheduled 1/13.   CorVue thoracic impedance suggesting possible fluid accumulation starting 1/5 and returned baseline 1/12.    Impedance suggesting possible dryness from 07/10/21-07/18/21.   Prescribed: Spironolactone 25 mg take 0.5 tablet daily.   Labs: 04/03/2021 Creatinine 0.87, BUN 12, Potassium 3.9, Sodium 140, GFR 84.95 09/29/2020 Creatinine 1.02, BUN 16, Potassium 4.2, Sodium 142, GFR 72.62 A complete set of results can be found in Results Review.   Recommendations: No changes.   Follow-up plan: ICM clinic phone appointment on 08/14/2021 to recheck fluid levels due to recent possible fluid accumulation.   91 day device clinic remote transmission 08/30/2021.   EP/Cardiology Office Visits: 07/27/2021 with Dr. Radford Pax.  Recall 12/18/2021 with Dr Rayann Heman.     Copy of ICM check sent to Dr. Rayann Heman.   3 month ICM trend: 07/26/2021.    12-14 Month ICM trend:     Rosalene Billings, RN 07/27/2021 7:59 AM

## 2021-08-01 ENCOUNTER — Encounter: Payer: Self-pay | Admitting: Cardiology

## 2021-08-09 ENCOUNTER — Ambulatory Visit: Payer: Medicare PPO | Admitting: Psychology

## 2021-08-09 ENCOUNTER — Other Ambulatory Visit: Payer: Self-pay

## 2021-08-09 ENCOUNTER — Encounter: Payer: Self-pay | Admitting: Psychology

## 2021-08-09 DIAGNOSIS — G3184 Mild cognitive impairment, so stated: Secondary | ICD-10-CM | POA: Diagnosis not present

## 2021-08-09 DIAGNOSIS — I447 Left bundle-branch block, unspecified: Secondary | ICD-10-CM | POA: Insufficient documentation

## 2021-08-09 DIAGNOSIS — I5022 Chronic systolic (congestive) heart failure: Secondary | ICD-10-CM | POA: Diagnosis not present

## 2021-08-09 DIAGNOSIS — I351 Nonrheumatic aortic (valve) insufficiency: Secondary | ICD-10-CM

## 2021-08-09 DIAGNOSIS — R911 Solitary pulmonary nodule: Secondary | ICD-10-CM | POA: Insufficient documentation

## 2021-08-09 DIAGNOSIS — F1010 Alcohol abuse, uncomplicated: Secondary | ICD-10-CM | POA: Diagnosis not present

## 2021-08-09 DIAGNOSIS — F1090 Alcohol use, unspecified, uncomplicated: Secondary | ICD-10-CM | POA: Insufficient documentation

## 2021-08-09 DIAGNOSIS — R4189 Other symptoms and signs involving cognitive functions and awareness: Secondary | ICD-10-CM

## 2021-08-09 HISTORY — DX: Mild cognitive impairment of uncertain or unknown etiology: G31.84

## 2021-08-09 HISTORY — DX: Nonrheumatic aortic (valve) insufficiency: I35.1

## 2021-08-09 NOTE — Progress Notes (Signed)
° °  Psychometrician Note   Cognitive testing was administered to Anthony Daugherty by Milana Kidney, B.S. (psychometrist) under the supervision of Dr. Christia Reading, Ph.D., licensed psychologist on 08/09/21. Anthony Daugherty did not appear overtly distressed by the testing session per behavioral observation or responses across self-report questionnaires. Rest breaks were offered.    The battery of tests administered was selected by Dr. Christia Reading, Ph.D. with consideration to Anthony Daugherty current level of functioning, the nature of his symptoms, emotional and behavioral responses during interview, level of literacy, observed level of motivation/effort, and the nature of the referral question. This battery was communicated to the psychometrist. Communication between Dr. Christia Reading, Ph.D. and the psychometrist was ongoing throughout the evaluation and Dr. Christia Reading, Ph.D. was immediately accessible at all times. Dr. Christia Reading, Ph.D. provided supervision to the psychometrist on the date of this service to the extent necessary to assure the quality of all services provided.    Anthony Daugherty will return within approximately 1-2 weeks for an interactive feedback session with Dr. Melvyn Novas at which time his test performances, clinical impressions, and treatment recommendations will be reviewed in detail. Anthony Daugherty understands he can contact our office should he require our assistance before this time.  A total of 105 minutes of billable time were spent face-to-face with Anthony Daugherty by the psychometrist. This includes both test administration and scoring time. Billing for these services is reflected in the clinical report generated by Dr. Christia Reading, Ph.D.  This note reflects time spent with the psychometrician and does not include test scores or any clinical interpretations made by Dr. Melvyn Novas. The full report will follow in a separate note.

## 2021-08-09 NOTE — Progress Notes (Signed)
NEUROPSYCHOLOGICAL EVALUATION [facility]  Date of Evaluation: August 09, 2021  Reason for Referral:   Anthony Daugherty is a 76 y.o. right-handed Caucasian male referred by  Anthony Butters, PA-C , to characterize his current cognitive functioning and assist with diagnostic clarity and treatment planning in the context of subjective cognitive decline, likely alcohol abuse, and a family history of Alzheimer's disease.   Assessment and Plan:   Clinical Impression(s): Mr. Route pattern of performance is suggestive of severe impairment surrounding all aspects of learning and memory. Additional performance variability was exhibited across complex attention and executive functioning. Performances were largely appropriate relative to age-matched peers across processing speed, basic attention, safety/judgment, receptive and expressive language, and visuospatial abilities. [name] denied any ongoing ADL dysfunction. His wife was generally in agreement with this but did report that she will monitor his medications and that he has become more reliant on his GPS while driving. At the present time, [name] best meets diagnostic criteria for a Mild Neurocognitive Disorder ("mild cognitive impairment").  The etiology is somewhat unclear. Despite this, consideration must be given to the early stages of Alzheimer's disease. Across memory testing, [name] was essentially amnestic (retention percentages ranging from 0% to 14%) across all three memory tasks and performed generally below expectation across yes/no recognition trials. This suggests evidence for rapid forgetting and an evolving and already fairly significant memory storage deficit, both of which are hallmark characteristics of Alzheimer's disease. It is encouraging that he performed well across expressive language and visuospatial tasks which are commonly affected early on in  typical disease progression. This would suggest him being in early stages if this disease process is indeed present.   Outside of this, consideration should be given to chronic and ongoing alcohol abuse. While he reported consuming 12 beers per week during the current interview, prior estimates as recent as September 2022 suggested 28 beers per week, which would certainly constitute significant abuse and would increase his risk for an alcohol-related dementia presentation, as well as Korsakoff's syndrome. Specific to the former, his pattern of memory loss with additional weakness in complex attention and executive functioning does align with expected patterns in this condition and this would remain a plausible etiology for obtained testing results. Whereas one would expect to see progressive, gradual decline with Alzheimer's disease, alcohol-related presentations can achieve relative stability assuming that greatly diminished alcohol use and/or complete abstinence is achieved. Specific to Korsakoff's syndrome, amnestic memory is commonly seen in this condition. However, he does not display symptoms of gait ataxia or confabulation and there is no report of a Wernicke's encephalopathy presentation in his prior medical records, making this less likely at the present time.  Differentiating between Alzheimer's disease and an alcohol-related dementia presentation when there is ongoing abuse and resistance to alcohol cessation is quite challenging. In these cases, given that he is in earlier stages, it is likely that additional time needs to progress to see if patterns of typical disease progression emerge. Continued medical monitoring will be important over time.  [name] does have numerous medical conditions which would affect his cerebrovascular health (e.g., hypertension, cardiomyopathy, coronary artery disease, aortic stenosis, chronic heart failure, hyperlipidemia, and a left branch bundle block). No  neuroimaging was able to be reviewed to better understand the extent of small vessel disease which is likely in his brain (unsure if his pacemaker is MRI compliant). While there are very likely vascular contributions, this would not  account for his largely amnestic memory profile. I do not see compelling evidence for Lewy body dementia, frontotemporal dementia, or more rare parkinsonian condition.  Recommendations: A repeat neuropsychological evaluation in 18-24 months (or sooner if functional decline is noted) is recommended to assess the trajectory of future cognitive decline should it occur. This will also aid in future efforts towards improved diagnostic clarity.  [name] has already been prescribed a medication aimed to address memory loss and concerns surrounding Alzheimer's disease (i.e., donepezil/Aricept). He is encouraged to continue taking this medication as prescribed. It is important to highlight that this medication has been shown to slow functional decline in some individuals. There is no current treatment which can stop or reverse cognitive decline when caused by a neurodegenerative illness.   A brain MRI could be beneficial to better understand the extent of cerebrovascular involvement and one has been previously ordered by Ms. Daugherty. Given the presence of his pacemaker, he would need to ensure that this device is MRI compliant before undergoing this sort of procedure. If there are no inherent issues, I would recommend that he have this scan completed.   As stated above, continued alcohol abuse will accelerate cognitive decline and may be the primary cause for ongoing memory impairment. I would strongly encourage him to gradually diminish alcohol consumption, aiming for complete sobriety.   If not performed recently, I would strongly encourage he and his doctors to run labs to ensure that thiamine levels are appropriate and do not warrant supplementation given ongoing alcohol  abuse and dependence.   Should there be further progression of current deficits, he is unlikely to regain any independent living skills lost. Therefore, it is recommended that he remain as involved as possible in all aspects of household chores, finances, and medication management, with supervision to ensure adequate performance. He will likely benefit from the establishment and maintenance of a routine in order to maximize his functional abilities over time.  It will be important for Mr. Yannuzzi to have another person with him when in situations where he may need to process information, weigh the pros and cons of different options, and make decisions, in order to ensure that he fully understands and recalls all information to be considered.  Performance across neurocognitive testing is not a strong predictor of an individual's safety operating a motor vehicle. Should his family wish to pursue a formalized driving evaluation, they could reach out to the following agencies: [employer] in [place]: [phone] [employer]: [facility]: [place]: [phone] or [phone]  Mr. Trautman is encouraged to attend to lifestyle factors for brain health (e.g., regular physical exercise, good nutrition habits, regular participation in cognitively-stimulating activities, and general stress management techniques), which are likely to have benefits for both emotional adjustment and cognition. Optimal control of vascular risk factors (including safe cardiovascular exercise and adherence to dietary recommendations) is encouraged. Continued participation in activities which provide mental stimulation and social interaction is also recommended.   Important information should be provided to Mr. Liska in written format in all instances. This information should be placed in a highly frequented and easily visible location within his  home to promote recall. External strategies such as written notes in a consistently used memory journal, visual and nonverbal auditory cues such as a calendar on the refrigerator or appointments with alarm, such as on a cell phone, can also help maximize recall.  To address problems with fluctuating attention, he may wish to consider:   -  Avoiding external distractions when needing to concentrate   -Limiting exposure to fast paced environments with multiple sensory demands   -Writing down complicated information and using checklists   -Attempting and completing one task at a time (i.e., no multi-tasking)   -Verbalizing aloud each step of a task to maintain focus   -Reducing the amount of information considered at one time  Review of Records:   Mr. Akamine was seen by [facility] Anthony Butters, PA-C) on 04/12/2021 for an evaluation of memory loss. Mr. Vanderburg denied ongoing memory concerns. However, per his wife, there have been memory concerns for the past two years, especially since his implantable defibrillator was placed. His wife reported that he may engage in a conversation and then ask questions soon after about topics previously discussed. She noted that he also has difficulty organizing his thoughts and following through with tasks. His wife also reported a longstanding history of irritability and anger issues throughout his life. He has been prescribed Prozac in the past and continues taking this medication. He reported vivid dreams but denied other sleep-related concerns, including REM sleep behaviors. He denied ongoing headaches, double vision, dizziness, focal numbness or tingling, unilateral weakness or tremors, urine incontinence or retention, constipation or diarrhea, or anosmia. At that time, he reported consuming around 28 cans of beer per week and denied any intention of cessation. There is report of a DUI in the past. Regarding ADLs, his wife has always managed finances. She  also monitors medication adherence. He continues to drive but records suggest some concerns surrounding him recalling where he is going. Performance on a brief cognitive screening instrument (MOCA) was 22/30. Ultimately, Mr. Dusenbery was referred for a comprehensive neuropsychological evaluation to characterize his cognitive abilities and to assist with diagnostic clarity and treatment planning.   No neuroimaging was available for my review.   Past Medical History:  Diagnosis Date   Acute otitis media of left ear with perforated tympanic membrane 02/25/2014   Aortic insufficiency 10/21/2018   Moderate AI by echo 08/2018   Aortic regurgitation    Mild-mod on echo 07/2017.  Moderate 08/2018.  Moderate-severe on echo 04/2019--> TEE 11/15/19 EF 50-55%, no LV dilation or WMA. Mod AV sclerosis w/out stenosis, mod-to-sev AI-.>rpt echo 6 mo. 11/2020 moderate AI   Aortic root dilatation 10/2018   11/17/19 45 mm aortic root at the sinuses of Valsalva->Dr. Radford Pax referred pt to CT surgeon. 52022 echo->40 mm   Aortic stenosis, mild    Arthritis    L-Spine   Cardiac murmur    Systolic and diastolic as of 10/6268.   Cardiomyopathy 10/12/2010   Global hypokinesis, EF 40-45% by cath and echo (2012 echo and 2019 echo).  Feb 2020 EF 20-25%. - mixed ICM/NICM (alcohol and CAD)  October 2020 EF 25-30%. 11/2020 EF 40-45%.   Chronic systolic (congestive) heart failure ([place]) 06/01/2019   max med mgmt, plus CRT-D placed 05/2019, EF improved to 50% after.   Coronary artery disease 11/16/2010   a. 2012 - 80% OM lesion. b. Cath 12/2018 - showing 95% prox RCA, 75% OM1, 50% prox-mid LAD. Unsuccessful PCI to 100% chronically occluded RCA summer 2020.   Diverticulosis    Erectile dysfunction 08/16/2011   Essential hypertension 07/19/2010   GAD (generalized anxiety disorder)    managed by Dr. Toy Care with prozac 20mg  qd   Gout    Usually Right great toe; colchicine helps well   Gouty arthritis of toe of left foot 05/27/2013    Habitual alcohol use  Hx of colonoscopy 2004; 2014   Initial was normal; 2014-polypectomy and diverticulosis.  Recall 2019.   Hyperlipidemia 10/23/2012   LBBB (left bundle branch block)    CRT-D placed 05/2019   Low back strain 10/24/2013   Mild cognitive impairment 04/12/2021   Obesity, Class I, BMI 30-34.9    Pulmonary nodule    0.5 cm RLL subpleural nodule.  Pt NEVER SMOKER.  Low risk->no f/u imaging is needed.   Thyroid nodule 04/23/2013    Past Surgical History:  Procedure Laterality Date   BIV ICD INSERTION CRT-D N/A 06/01/2019   Procedure: BIV ICD INSERTION CRT-D;  Surgeon: [name], MD;  Location: [facility] [facility];  Service: Cardiovascular;  Laterality: N/A;   CARDIAC CATHETERIZATION  10/12/10   diagnostic only: 1 vessel CAD with TIMI 3 flow, EF 40-45%.  Mild AS.  Medical mgmt of CAD.   COLONOSCOPY     COLONOSCOPY W/ POLYPECTOMY  10/06/12   Diverticulosis and tubular adenoma w/out high grade dysplasia.  Repeat 5 yrs (Dr. Deatra Ina).   CORONARY ATHERECTOMY N/A 01/08/2019   100% chronic RCA occlusion impossible to clear.  Med mgmt. Procedure: CORONARY ATHERECTOMY;  Surgeon: Sherren Mocha, MD;  Location: [facility];  Service: Cardiovascular;  Laterality: N/A;   POLYPECTOMY     RIGHT HEART CATH AND CORONARY ANGIOGRAPHY N/A 12/24/2018   95% RCA occlusion, 75% obtuse marginal occlusion (3V dz). Procedure: RIGHT HEART CATH AND CORONARY ANGIOGRAPHY;  Surgeon: Lorretta Harp, MD;  Location: [facility];  Service: Cardiovascular;  Laterality: N/A;   TEE WITHOUT CARDIOVERSION N/A 11/15/2019   EF 50-55%, no LV dilation or WMA. Mod AV sclerosis w/out stenosis, mod-to-sev AI.  Procedure: TRANSESOPHAGEAL ECHOCARDIOGRAM (TEE);  Surgeon: Fay Records, MD;  Location: [facility];  Service: Cardiovascular;  Laterality: N/A;   TRANSTHORACIC ECHOCARDIOGRAM  09/2010; 07/2017; 08/2018; 10/2019;05/2020   2012:  EF 40-45%, global hypokinesis, mild AS.  07/2017: no interval chg except  mild/mod AR.  08/2018 EF 20-25%, global LV hypok, DD, biatrial enlargemt, mod AR, aortic root 3.8 cm. 10/2019 EF 50%, mild dec RV fxn, mod-sev AR, aor root dil 42 mm. 05/2020 EF 45-50%,grd I DD, mod AI, aorta 74mm, severe LAE. 10/2020 EF 45-50%, mild LVH, global hypokin, grd I DD, mod AI.    Current Outpatient Medications:    atorvastatin (LIPITOR) 40 MG tablet, Take 1 tablet (40 mg total) by mouth daily. Please keep upcoming appt with Dr. Radford Pax in December 2022 before anymore refills. Thank you, Disp: 90 tablet, Rfl: 0   carvedilol (COREG) 25 MG tablet, TAKE  (1)  TABLET TWICE A DAY., Disp: 180 tablet, Rfl: 2   colchicine 0.6 MG tablet, TAKE TWO TABLETS AT ONSET OF GOUT PAIN, REPEAT IN 1 HOUR, THEN 1 TABLET TWICE A DAY UNTIL RESOLVED, Disp: 30 tablet, Rfl: 1   donepezil (ARICEPT) 10 MG tablet, Take half tablet (5 mg) daily for 2 weeks, then increase to the full tablet at 10 mg daily, Disp: 30 tablet, Rfl: 11   FLUoxetine (PROZAC) 20 MG capsule, Take 20 mg by mouth daily. , Disp: , Rfl:    hydrochlorothiazide (HYDRODIURIL) 25 MG tablet, Take 25 mg by mouth daily., Disp: , Rfl:    sacubitril-valsartan (ENTRESTO) 97-103 MG, Take 1 tablet by mouth 2 (two) times daily., Disp: 90 tablet, Rfl: 3   spironolactone (ALDACTONE) 25 MG tablet, Take 0.5 tablets (12.5 mg total) by mouth daily., Disp: 45 tablet, Rfl: 1  Clinical Interview:   The following  information was obtained during a clinical interview with Mr. Kitchings and his wife prior to cognitive testing.  Cognitive Symptoms: Decreased short-term memory: Denied. His wife reported more pronounced concerns, stating that his sons had also expressed concerns surrounding deteriorating memory. She noted that Mr. Delbuono will have trouble recalling details of past conversations and will repeat himself or ask repetitive questions often. Concerns were said to have been present for the past two years and progressively declined.  Decreased long-term memory:  Denied. Decreased attention/concentration: Denied. Reduced processing speed: Denied. Difficulties with executive functions: Denied. He denied trouble with impulsivity or any significant personality changes. His wife reported a longstanding history of irritability and trouble managing his anger. She noted that this may have slightly worsened lately.  Difficulties with emotion regulation: Endorsed (see above).  Difficulties with receptive language: Denied. Difficulties with word finding: Denied. Decreased visuoperceptual ability: Denied.  Difficulties completing ADLs: Denied. His wife manages finances and bill paying which is longstanding in nature. He reported managing medications independently without difficulty. His wife expressed some concerns and records suggest that she monitors medication adherence. He denied any driving related concerns. His wife noted that he seems to have become more reliant on his GPS while traveling.   Additional Medical History: History of traumatic brain injury/concussion: Endorsed. When he was around 74-35 years old, he reported hitting the back of his head on a diving board, leading to him requiring about 24 stiches in the occipital region. No persisting difficulties or loss in consciousness were reported. No other potential head injuries were described.  History of stroke: Denied. History of seizure activity: Denied. History of known exposure to toxins: Denied. Symptoms of chronic pain: Denied. Experience of frequent headaches/migraines: Denied. Frequent instances of dizziness/vertigo: Denied.  Sensory changes: He wears glasses with benefit. Other sensory changes/difficulties (e.g., hearing, taste, or smell) were denied.  Balance/coordination difficulties: Denied. He also denied any recent falls.  Other motor difficulties: Denied.  Sleep History: Estimated hours obtained each night: 6-8 hours.  Difficulties falling asleep: Denied. His wife reported that he  commonly stays up late and subsequently sleeps later into the morning. She described him having an abnormal sleep cycle because of this, noting that there may be some nights where he remains awake throughout the night.  Difficulties staying asleep: Denied. Feels rested and refreshed upon awakening: Endorsed.  History of snoring: Denied. History of waking up gasping for air: Denied. Witnessed breath cessation while asleep: Denied.  History of vivid dreaming: Endorsed. Excessive movement while asleep: Denied. Instances of acting out his dreams: Denied.  Psychiatric/Behavioral Health History: Depression: He described his current mood as "fine" and denied any prior mental health concerns or diagnoses. His wife stated that he has been prescribed Prozac for an extended period of time, likely to treat anger and some mild anxiety concerns. She felt that the dosing of this medication was ineffective and wondered if it should be increased. Current or remote suicidal ideation, intent, or plan was denied.  Anxiety: Denied. Mania: Denied. Trauma History: Denied. Visual/auditory hallucinations: Denied. Delusional thoughts: Denied.  Tobacco: Endorsed. He reported regularly using chewing tobacco products.  Alcohol: Currently, he reported consuming 12 cans of beer per week. Medical records as recent as September 2022 suggest alcohol use closer to 28 cans of beer per week, representing a fairly large discrepancy. His wife reported a long history of alcohol abuse and prior DUI.  Recreational drugs: Denied.  Family History: Problem Relation Age of Onset   Congenital heart disease Brother  cardiomyopathy   Alzheimer's disease Mother    COPD Father    Heart failure Father        congestive   Colon cancer Neg Hx    Esophageal cancer Neg Hx    Stomach cancer Neg Hx    Rectal cancer Neg Hx    Colon polyps Neg Hx    This information was confirmed by Mr. Brigham.  Academic/Vocational  History: Highest level of educational attainment: 16 years. He graduated from high school and earned a Dietitian in business administration. He described himself as an average (C) student in academic settings. High level math represented a likely relative weakness.  History of developmental delay: Denied. History of grade repetition: Denied. Enrollment in special education courses: Denied. History of LD/ADHD: Denied.  Employment: Retired. 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   Evaluation Results:   Behavioral Observations: Mr. Blanchfield was accompanied by his wife, arrived to his appointment on time, and was appropriately dressed and groomed. He appeared alert. Observed gait and station were within normal limits. Gross motor functioning appeared intact upon informal observation and no abnormal movements (e.g., tremors) were noted. His affect was generally relaxed and positive. Spontaneous speech was fluent and word finding difficulties were not observed during the clinical interview. Thought processes were coherent, organized, and normal in content. Insight into his cognitive difficulties appeared limited in that he denied all cognitive concerns despite objective testing revealing several areas of notable impairment.   During testing, one task (D-KEFS Color Word) was discontinued due to Mr. Cunliffe reporting that he was color blind. He repeatedly asked "How did I do" despite the psychometrist repeatedly telling him that scoring is done after the evaluation is complete and that he has a feedback appointment scheduled with me during the next week to go over the results. Sustained attention was appropriate. Task engagement was adequate and he persisted when challenged. Overall, Mr. Lukacs was cooperative with the clinical interview and subsequent  testing procedures.   Adequacy of Effort: The validity of neuropsychological testing is limited by the extent to which the individual being tested may be assumed to have exerted adequate effort during testing. Mr. Skalski expressed his intention to perform to the best of his abilities and exhibited adequate task engagement and persistence. Scores across stand-alone and embedded performance validity measures were variable but generally within expectation. His one below expectation performance is believed to be due to true memory impairment as opposed to poor engagement or attempts to perform poorly. As such, the results of the current evaluation are believed to be a valid representation of Mr. Hulbert current cognitive functioning.  Test Results: Mr. Yang was poorly oriented at the time of the current evaluation. He incorrectly stated his age ("19") and was unable to state his address. He also was unable to state the current year ("2013"), month ("September"), date, day of the week, or current location. When asked why he presented for testing, he responded with "My wife made me come."   Intellectual abilities based upon educational and vocational attainment were estimated to be in the average range. Premorbid abilities were estimated to be within the average range based upon a single-word reading test.   Processing speed was below average to average. Basic attention was below average to average. More complex attention (e.g., working memory) was well below average to average. Executive functioning was variable, ranging from the exceptionally  low to average normative ranges. He performed in the average range across a task assessing safety and judgment.   Assessed receptive language abilities were average. Likewise, Mr. Ransom did not exhibit any difficulties comprehending task instructions and answered all questions asked of him appropriately. Assessed expressive language (e.g., verbal fluency  and confrontation naming) was average to well above average.     Assessed visuospatial/visuoconstructional abilities were average to above average outside of a line orientation task which scored exceptionally low.    Learning (i.e., encoding) of novel verbal information was exceptionally low to below average. Spontaneous delayed recall (i.e., retrieval) of previously learned information was exceptionally low. Retention rates were 14% (raw score of 1) across a story learning task, 0% across a list learning task, and 11% across a figure drawing task. Performance across recognition tasks was below expectation, suggesting limited evidence for information consolidation.   Results of emotional screening instruments suggested that recent symptoms of generalized anxiety were in the minimal range, while symptoms of depression were within normal limits range. A screening instrument assessing recent sleep quality suggested the presence of minimal sleep dysfunction.  Tables of Scores:   Note: This summary of test scores accompanies the interpretive report and should not be considered in isolation without reference to the appropriate sections in the text. Descriptors are based on appropriate normative data and may be adjusted based on clinical judgment. Terms such as "Within Normal Limits" and "Outside Normal Limits" are used when a more specific description of the test score cannot be determined.       Percentile - Normative Descriptor > 98 - Exceptionally High 91-97 - Well Above Average 75-90 - Above Average 25-74 - Average 9-24 - Below Average 2-8 - Well Below Average < 2 - Exceptionally Low       Validity:   DESCRIPTOR       Dot Counting Test: --- --- Within Normal Limits  RBANS Effort Index: --- --- Outside Normal Limits  WAIS-IV Reliable Digit Span: --- --- Within Normal Limits       Orientation:      Raw Score Percentile   NAB Orientation, Form 1 15/29 --- ---       Cognitive Screening:       Raw Score Percentile   SLUMS: 17/30 --- ---       RBANS, Form A: Standard Score/ Scaled Score Percentile   Total Score 66 1 Exceptionally Low  Immediate Memory 69 2 Well Below Average    List Learning 3 1 Exceptionally Low    Story Memory 6 9 Below Average  Visuospatial/Constructional 84 14 Below Average    Figure Copy 12 75 Above Average    Line Orientation 7/20 <2 Exceptionally Low  Language 92 30 Average    Picture Naming 10/10 51-75 Average    Semantic Fluency 7 16 Below Average  Attention 82 12 Below Average    Digit Span 7 16 Below Average    Coding 7 16 Below Average  Delayed Memory 40 <1 Exceptionally Low    List Recall 0/10 <2 Exceptionally Low    List Recognition 12/20 <2 Exceptionally Low    Story Recall 2 <1 Exceptionally Low    Story Recognition 8/12 8-15 Below Average    Figure Recall 3 1 Exceptionally Low    Figure Recognition 4/8 9-20 Below Average        Intellectual Functioning:      Standard Score Percentile   Test of Premorbid Functioning: 94 34 Average  Attention/Executive Function:     Trail Making Test (TMT): Raw Score (T Score) Percentile     Part A 33 secs.,  0 errors (52) 58 Average    Part B Discontinued --- Impaired         Scaled Score Percentile   WAIS-IV Digit Span: 7 16 Below Average    Forward 9 37 Average    Backward 5 5 Well Below Average    Sequencing 9 37 Average        Scaled Score Percentile   WAIS-IV Similarities: 11 63 Average       D-KEFS Verbal Fluency Test: Raw Score (Scaled Score) Percentile     Letter Total Correct 34 (10) 50 Average    Category Total Correct 42 (14) 91 Well Above Average    Category Switching Total Correct 12 (10) 50 Average    Category Switching Accuracy 9 (8) 25 Average      Total Set Loss Errors 2 (10) 50 Average      Total Repetition Errors 18 (1) <1 Exceptionally Low       NAB Executive Functions Module, Form 1: T Score Percentile     Judgment 53 62 Average       Language:      Verbal Fluency Test: Raw Score (T Score) Percentile     Phonemic Fluency (FAS) 34 (45) 31 Average    Animal Fluency 24 (60) 84 Above Average        NAB Language Module, Form 1: T Score Percentile     Auditory Comprehension 56 73 Average    Naming 30/31 (56) 73 Average       Visuospatial/Visuoconstruction:      Raw Score Percentile   Clock Drawing: 10/10 --- Within Normal Limits        Scaled Score Percentile   WAIS-IV Block Design: 8 25 Average       Mood and Personality:      Raw Score Percentile   Geriatric Depression Scale: 0 --- Within Normal Limits  Geriatric Anxiety Scale: 3 --- Minimal    Somatic 3 --- Minimal    Cognitive 0 --- Minimal    Affective 0 --- Minimal       Additional Questionnaires:      Raw Score Percentile   PROMIS Sleep Disturbance Questionnaire: 17 --- None to Slight   Informed Consent and Coding/Compliance:   The current evaluation represents a clinical evaluation for the purposes previously outlined by the referral source and is in no way reflective of a forensic evaluation.   Mr. Sigl was provided with a verbal description of the nature and purpose of the present neuropsychological evaluation. Also reviewed were the foreseeable risks and/or discomforts and benefits of the procedure, limits of confidentiality, and mandatory reporting requirements of this provider. The patient was given the opportunity to ask questions and receive answers about the evaluation. Oral consent to participate was provided by the patient.   This evaluation was conducted by Christia Reading, Ph.D., ABPP-CN, board certified clinical neuropsychologist. Mr. Boley completed a clinical interview with Dr. Melvyn Novas, billed as one unit [phone], and 105 minutes of cognitive testing and scoring, billed as one unit [phone] and three additional units [id number]. Psychometrist Milana Kidney, B.S., assisted Dr. Melvyn Novas with test administration and scoring procedures. As a separate and discrete  service, Dr. Melvyn Novas spent a total of 160 minutes in interpretation and report writing billed as one unit [phone] and two units 96133.

## 2021-08-10 ENCOUNTER — Encounter: Payer: Self-pay | Admitting: Psychology

## 2021-08-10 ENCOUNTER — Other Ambulatory Visit: Payer: Medicare PPO | Admitting: *Deleted

## 2021-08-10 ENCOUNTER — Other Ambulatory Visit: Payer: Self-pay

## 2021-08-10 DIAGNOSIS — I359 Nonrheumatic aortic valve disorder, unspecified: Secondary | ICD-10-CM | POA: Diagnosis not present

## 2021-08-10 DIAGNOSIS — I255 Ischemic cardiomyopathy: Secondary | ICD-10-CM

## 2021-08-10 DIAGNOSIS — I10 Essential (primary) hypertension: Secondary | ICD-10-CM | POA: Diagnosis not present

## 2021-08-10 DIAGNOSIS — E7800 Pure hypercholesterolemia, unspecified: Secondary | ICD-10-CM

## 2021-08-10 DIAGNOSIS — I77810 Thoracic aortic ectasia: Secondary | ICD-10-CM

## 2021-08-10 DIAGNOSIS — I5042 Chronic combined systolic (congestive) and diastolic (congestive) heart failure: Secondary | ICD-10-CM | POA: Diagnosis not present

## 2021-08-10 DIAGNOSIS — I2510 Atherosclerotic heart disease of native coronary artery without angina pectoris: Secondary | ICD-10-CM

## 2021-08-10 DIAGNOSIS — G3184 Mild cognitive impairment, so stated: Secondary | ICD-10-CM | POA: Insufficient documentation

## 2021-08-10 NOTE — Progress Notes (Signed)
Patient ID: Anthony Daugherty                 DOB: 09/16/1945                      MRN: [id number]     HPI: Anthony Daugherty is a 76 y.o. male referred by Dr. Radford Pax to HTN clinic. PMH is significant for CAD, HLD, HTN, CHF (HFrEF, NYHA class IIa), aortic valve disorder, ischemic cardiomyopathy, dilated aortic root, obesity, low back strain, heavy alcohol use, cognitive decline. LVEF via cath in 2012 was 40-45%. PT underwent another cath in 12/2018 showing 95% pRCA, 75% OM1 and 50% prox to mid LAD, unsuccessful PCI of the RCA. Many previous providers have attempted to titrate HF meds but had been limited by soft bps. A repeat ECHO in 04/2019 showed an LVEF of 25-30% on Entresto 49/51mg  BID + carvedilol 25mg  BID. A BIV ICD was inserted in 05/2019. Most recent ECHO in 12/22 showed stable LVEF of 45-50%.   At last visit on 1/13, pt's blood pressure was elevated in clinic at 164/59 and had been running high at home as well. He was continued on carvedilol 25 mg twice daily and spironolactone 12.5 mg daily. Entresto was increased to 97-103mg  BID for better BP control. He was asymptomatic for HFrEF. Referred for PharmD visit in 1 week for BP control and BMET. Pt sent readings through myChart on 1/18 which seemed to be improving from 150s to low 130s, HR 60-70s. Encouraged patient to continue recording measurements in the meantime.   Pt presents to clinic accompanied by his wife. When asked about his current medications, pt presented an outdated list of medications. Pt does not know if he takes HCTZ. Pt states he has only been taking Entresto 97/103mg  once daily and carvedilol 25mg  once daily. Pt thought the doctor told him to take spironolactone 12.5 mg once daily (half-tablet) for 2 weeks, then increase to 25 mg (1 tablet) after that. He states that he takes his home blood pressure at night (but his wife states he takes it in the afternoon?), and it is typically higher on the left. He takes all of his blood  pressure medications in the late afternoon. He has not taken his medications today.   Current HTN meds: Entresto 97-103mg  BID, carvedilol 25 mg twice daily, spironolactone 12.5 mg daily Previously tried: HCTZ 25 mg daily (stopped due to [employer] start?), nebivolol 10mg  daily BP goal: <130/63mmHg  Family History: Alzheimer's disease in his mother; COPD in his father; Congenital heart disease in his brother; Heart failure in his father. There is no history of Colon cancer, Esophageal cancer, Stomach cancer, Rectal cancer, or Colon polyps.  Social History: former smoker, current smokeless tobacco - chews, heavy alcohol use - 28 cans beer/wek  Home BP readings:   Jan. 14    156/79   67  R Jan. 15    157/74   64  R Jan. 16    157/74   69  L Jan. 17    127/59   92  L Jan. 18    133/68   70  Wt Readings from Last 3 Encounters:  07/27/21 170 lb (77.1 kg)  04/12/21 167 lb (75.8 kg)  04/03/21 164 lb (74.4 kg)   BP Readings from Last 3 Encounters:  07/27/21 (!) 158/77  04/12/21 (!) 159/85  04/03/21 118/68   Pulse Readings from Last 3 Encounters:  07/27/21 65  04/12/21 72  04/03/21  72    Renal function: CrCl cannot be calculated (Patient's most recent lab result is older than the maximum 21 days allowed.).  Past Medical History:  Diagnosis Date   Acute otitis media of left ear with perforated tympanic membrane 02/25/2014   Amnestic MCI (mild cognitive impairment with memory loss) 08/09/2021   Aortic insufficiency 10/21/2018   Moderate AI by echo 08/2018   Aortic regurgitation    Mild-mod on echo 07/2017.  Moderate 08/2018.  Moderate-severe on echo 04/2019--> TEE 11/15/19 EF 50-55%, no LV dilation or WMA. Mod AV sclerosis w/out stenosis, mod-to-sev AI-.>rpt echo 6 mo. 11/2020 moderate AI   Aortic root dilatation 10/2018   11/17/19 45 mm aortic root at the sinuses of Valsalva->Dr. Radford Pax referred pt to CT surgeon. 52022 echo->40 mm   Aortic stenosis, mild    Arthritis    L-Spine   Cardiac  murmur    Systolic and diastolic as of 02/6760.   Cardiomyopathy 10/12/2010   Global hypokinesis, EF 40-45% by cath and echo (2012 echo and 2019 echo).  Feb 2020 EF 20-25%. - mixed ICM/NICM (alcohol and CAD)  October 2020 EF 25-30%. 11/2020 EF 40-45%.   Chronic systolic (congestive) heart failure ([place]) 06/01/2019   max med mgmt, plus CRT-D placed 05/2019, EF improved to 50% after.   Coronary artery disease 11/16/2010   a. 2012 - 80% OM lesion. b. Cath 12/2018 - showing 95% prox RCA, 75% OM1, 50% prox-mid LAD. Unsuccessful PCI to 100% chronically occluded RCA summer 2020.   Diverticulosis    Erectile dysfunction 08/16/2011   Essential hypertension 07/19/2010   GAD (generalized anxiety disorder)    managed by Dr. Toy Care with prozac 20mg  qd   Gout    Usually Right great toe; colchicine helps well   Gouty arthritis of toe of left foot 05/27/2013   Habitual alcohol use    Hx of colonoscopy 2004; 2014   Initial was normal; 2014-polypectomy and diverticulosis.  Recall 2019.   Hyperlipidemia 10/23/2012   LBBB (left bundle branch block)    CRT-D placed 05/2019   Low back strain 10/24/2013   Obesity, Class I, BMI 30-34.9    Pulmonary nodule    0.5 cm RLL subpleural nodule.  Pt NEVER SMOKER.  Low risk->no f/u imaging is needed.   Thyroid nodule 04/23/2013    Current Outpatient Medications on File Prior to Visit  Medication Sig Dispense Refill   atorvastatin (LIPITOR) 40 MG tablet Take 1 tablet (40 mg total) by mouth daily. Please keep upcoming appt with Dr. Radford Pax in December 2022 before anymore refills. Thank you 90 tablet 0   carvedilol (COREG) 25 MG tablet TAKE  (1)  TABLET TWICE A DAY. 180 tablet 2   colchicine 0.6 MG tablet TAKE TWO TABLETS AT ONSET OF GOUT PAIN, REPEAT IN 1 HOUR, THEN 1 TABLET TWICE A DAY UNTIL RESOLVED 30 tablet 1   donepezil (ARICEPT) 10 MG tablet Take half tablet (5 mg) daily for 2 weeks, then increase to the full tablet at 10 mg daily 30 tablet 11   FLUoxetine (PROZAC)  20 MG capsule Take 20 mg by mouth daily.      hydrochlorothiazide (HYDRODIURIL) 25 MG tablet Take 25 mg by mouth daily.     sacubitril-valsartan (ENTRESTO) 97-103 MG Take 1 tablet by mouth 2 (two) times daily. 90 tablet 3   spironolactone (ALDACTONE) 25 MG tablet Take 0.5 tablets (12.5 mg total) by mouth daily. 45 tablet 1   No current facility-administered medications on file prior to  visit.    Allergies  Allergen Reactions   Penicillins Swelling    Did it involve swelling of the face/tongue/throat, SOB, or low BP? No Did it involve sudden or severe rash/hives, skin peeling, or any reaction on the inside of your mouth or nose? No Did you need to seek medical attention at a hospital or doctor's office? No When did it last happen? ~10 years ago If all above answers are NO, may proceed with cephalosporin use.      Assessment/Plan:  1. Hypertension - BP is elevated today above goal of <130/58mmHg on multiple medications. Pt states he is taking his medications very differently than prescribed. As patient has not been taking multiple medications correctly, discussed the correct dose, strength, and frequency of Entresto, carvedilol, and spironolactone. Will have patient start taking Entresto 97/103 mg by mouth twice daily and carvedilol 25 mg by mouth twice daily. As patient has been taking spironolactone 25 mg (1 tablet) daily and BMET was stable on re-check, will continue spironolactone 25 mg daily. Do not believe patient is still taking HCTZ, plan to confirm at f/u visit. Will f/u in 2 weeks to reassess labs, BP, and adherence to medication regimen. Advised patient to bring his medication bottles, a log of home blood pressures, and his home blood pressure monitor to next visit.   Jethro Poling, PharmD Student assisted in this visit.  Ramond Dial, Pharm.D, BCPS, CPP [facility]  [address], [place], [place] [address]  Phone: [phone]; Fax: [phone]

## 2021-08-11 LAB — BASIC METABOLIC PANEL
BUN/Creatinine Ratio: 15 (ref 10–24)
BUN: 17 mg/dL (ref 8–27)
CO2: 22 mmol/L (ref 20–29)
Calcium: 9.3 mg/dL (ref 8.6–10.2)
Chloride: 103 mmol/L (ref 96–106)
Creatinine, Ser: 1.1 mg/dL (ref 0.76–1.27)
Glucose: 102 mg/dL — ABNORMAL HIGH (ref 70–99)
Potassium: 3.9 mmol/L (ref 3.5–5.2)
Sodium: 138 mmol/L (ref 134–144)
eGFR: 70 mL/min/{1.73_m2} (ref >59)

## 2021-08-12 ENCOUNTER — Encounter: Payer: Self-pay | Admitting: Family Medicine

## 2021-08-13 ENCOUNTER — Other Ambulatory Visit: Payer: Medicare PPO

## 2021-08-13 ENCOUNTER — Ambulatory Visit (INDEPENDENT_AMBULATORY_CARE_PROVIDER_SITE_OTHER)
Admission: RE | Admit: 2021-08-13 | Discharge: 2021-08-13 | Disposition: A | Discharge status: Home / Self Care | Payer: Medicare PPO | Source: Ambulatory Visit | Attending: Cardiology | Admitting: Cardiology

## 2021-08-13 ENCOUNTER — Ambulatory Visit: Payer: Medicare PPO | Admitting: Pharmacist

## 2021-08-13 ENCOUNTER — Other Ambulatory Visit: Payer: Self-pay

## 2021-08-13 VITALS — BP 134/62 | HR 74

## 2021-08-13 DIAGNOSIS — I10 Essential (primary) hypertension: Secondary | ICD-10-CM | POA: Diagnosis not present

## 2021-08-13 DIAGNOSIS — I359 Nonrheumatic aortic valve disorder, unspecified: Secondary | ICD-10-CM

## 2021-08-13 DIAGNOSIS — I77810 Thoracic aortic ectasia: Secondary | ICD-10-CM

## 2021-08-13 DIAGNOSIS — I771 Stricture of artery: Secondary | ICD-10-CM | POA: Diagnosis not present

## 2021-08-13 DIAGNOSIS — I5042 Chronic combined systolic (congestive) and diastolic (congestive) heart failure: Secondary | ICD-10-CM | POA: Diagnosis not present

## 2021-08-13 DIAGNOSIS — E7800 Pure hypercholesterolemia, unspecified: Secondary | ICD-10-CM

## 2021-08-13 DIAGNOSIS — I358 Other nonrheumatic aortic valve disorders: Secondary | ICD-10-CM | POA: Diagnosis not present

## 2021-08-13 DIAGNOSIS — I2510 Atherosclerotic heart disease of native coronary artery without angina pectoris: Secondary | ICD-10-CM

## 2021-08-13 DIAGNOSIS — I255 Ischemic cardiomyopathy: Secondary | ICD-10-CM

## 2021-08-13 DIAGNOSIS — M47814 Spondylosis without myelopathy or radiculopathy, thoracic region: Secondary | ICD-10-CM | POA: Diagnosis not present

## 2021-08-13 MED ORDER — SPIRONOLACTONE 25 MG PO TABS: 25.0000 mg | ORAL_TABLET | Freq: Every day | ORAL | 1 refills | Status: DC

## 2021-08-13 MED ORDER — IOHEXOL 350 MG/ML SOLN
100.0000 mL | Freq: Once | INTRAVENOUS | Status: AC | PRN
  Administered 2021-08-13: 100 mL via INTRAVENOUS

## 2021-08-13 NOTE — Patient Instructions (Addendum)
It was nice to meet you today!  Your blood pressure today was 134/62. Your blood pressure goal is <130/80.  Check your blood pressure once daily.   Start taking Entresto 97/103 mg by mouth twice daily. Start taking carvedilol 25 mg by mouth twice daily. Continue taking spironolactone 25 mg (1 tablet) by mouth once daily.  We will have you come back on February 21st, 2023 at 2:00 PM to check your blood pressure.  At your next visit bring: your medication bottles, a log of your home blood pressures, and your home blood pressure monitor.

## 2021-08-14 ENCOUNTER — Ambulatory Visit (INDEPENDENT_AMBULATORY_CARE_PROVIDER_SITE_OTHER): Payer: Medicare PPO

## 2021-08-14 DIAGNOSIS — I5042 Chronic combined systolic (congestive) and diastolic (congestive) heart failure: Secondary | ICD-10-CM

## 2021-08-14 DIAGNOSIS — Z95810 Presence of automatic (implantable) cardiac defibrillator: Secondary | ICD-10-CM

## 2021-08-16 ENCOUNTER — Other Ambulatory Visit: Payer: Self-pay

## 2021-08-16 ENCOUNTER — Ambulatory Visit (INDEPENDENT_AMBULATORY_CARE_PROVIDER_SITE_OTHER): Payer: Medicare PPO | Admitting: Psychology

## 2021-08-16 ENCOUNTER — Telehealth: Payer: Self-pay

## 2021-08-16 DIAGNOSIS — F1010 Alcohol abuse, uncomplicated: Secondary | ICD-10-CM

## 2021-08-16 DIAGNOSIS — G3184 Mild cognitive impairment, so stated: Secondary | ICD-10-CM | POA: Diagnosis not present

## 2021-08-16 DIAGNOSIS — R4181 Age-related cognitive decline: Secondary | ICD-10-CM

## 2021-08-16 NOTE — Progress Notes (Signed)
° °  Neuropsychology Feedback Session Tillie Rung. [place] [facility]  Reason for Referral:   Anthony Daugherty is a 76 y.o. right-handed Caucasian male referred by  Sharene Butters, PA-C , to characterize his current cognitive functioning and assist with diagnostic clarity and treatment planning in the context of subjective cognitive decline, likely alcohol abuse, and a family history of Alzheimer's disease.   Feedback:   Anthony Daugherty completed a comprehensive neuropsychological evaluation on 08/09/2021. Please refer to that encounter for the full report and recommendations. Briefly, results suggested severe impairment surrounding all aspects of learning and memory. Additional performance variability was exhibited across complex attention and executive functioning. The etiology is somewhat unclear. Despite this, consideration must be given to the early stages of Alzheimer's disease. Across memory testing, Anthony Daugherty was essentially amnestic (retention percentages ranging from 0% to 14%) across all three memory tasks and performed generally below expectation across yes/no recognition trials. This suggests evidence for rapid forgetting and an evolving and already fairly significant memory storage deficit, both of which are hallmark characteristics of Alzheimer's disease. It is encouraging that he performed well across expressive language and visuospatial tasks which are commonly affected early on in typical disease progression. This would suggest him being in early stages if this disease process is indeed present. Outside of this, consideration should be given to chronic and ongoing alcohol abuse. While he reported consuming 12 beers per week during the current interview, prior estimates as recent as September 2022 suggested 28 beers per week, which would certainly constitute significant abuse and would increase his risk for an alcohol-related dementia presentation. His  pattern of memory loss with additional weakness in complex attention and executive functioning does align with expected patterns in this condition and this would remain a plausible etiology for obtained testing results.   Anthony Daugherty was accompanied by his wife during the current feedback session. Content of the current session focused on the results of his neuropsychological evaluation. Anthony Daugherty was given the opportunity to ask questions and his questions were answered. He was encouraged to reach out should additional questions arise. A copy of his report was provided at the conclusion of the visit.      25 minutes were spent conducting the current feedback session with Anthony Daugherty, billed as one unit [phone].

## 2021-08-16 NOTE — Telephone Encounter (Signed)
I spoke with the patient wife and she agreed to have him send missed ICM transmission.

## 2021-08-17 NOTE — Progress Notes (Signed)
EPIC Encounter for ICM Monitoring  Patient Name: YISHAI REHFELD is a 76 y.o. male Date: 08/17/2021 Primary Care Physican: Tammi Sou, MD Primary Cardiologist: Radford Pax Electrophysiologist: Allred Bi-V Pacing: 98%         07/27/2021 Office Weight: 170 lbs                                                            Transmission reviewed.   CorVue thoracic impedance suggesting fluid levels returned to normal.   Prescribed: Spironolactone 25 mg take 0.5 tablet daily.   Labs: 04/03/2021 Creatinine 0.87, BUN 12, Potassium 3.9, Sodium 140, GFR 84.95 09/29/2020 Creatinine 1.02, BUN 16, Potassium 4.2, Sodium 142, GFR 72.62 A complete set of results can be found in Results Review.   Recommendations: No changes.   Follow-up plan: ICM clinic phone appointment on 09/10/2021.   91 day device clinic remote transmission 08/30/2021.   EP/Cardiology Office Visits:  Recall 12/18/2021 with Dr Rayann Heman.     Copy of ICM check sent to Dr. Rayann Heman.   3 month ICM trend: 08/17/2021.    12-14 Month ICM trend:     Rosalene Billings, RN 08/17/2021 4:47 PM

## 2021-08-22 ENCOUNTER — Encounter: Payer: Self-pay | Admitting: Family Medicine

## 2021-08-29 ENCOUNTER — Other Ambulatory Visit: Payer: Self-pay

## 2021-08-29 ENCOUNTER — Ambulatory Visit: Payer: Medicare PPO

## 2021-08-29 ENCOUNTER — Ambulatory Visit (INDEPENDENT_AMBULATORY_CARE_PROVIDER_SITE_OTHER): Payer: Medicare PPO

## 2021-08-29 VITALS — BP 100/64 | HR 64 | Temp 98.3°F | Wt 165.4 lb

## 2021-08-29 DIAGNOSIS — Z0000 Encounter for general adult medical examination without abnormal findings: Secondary | ICD-10-CM

## 2021-08-29 NOTE — Progress Notes (Signed)
Subjective:   Anthony Daugherty is a 76 y.o. male who presents for Medicare Annual/Subsequent preventive examination.  Review of Systems     Cardiac Risk Factors include: advanced age (>29men, >17 women);hypertension;dyslipidemia;male gender     Objective:    Today's Vitals   08/29/21 1438  BP: 100/64  Pulse: 64  Temp: 98.3 F (36.8 C)  SpO2: 97%  Weight: 165 lb 6.4 oz (75 kg)   Body mass index is 27.52 kg/m.  Advanced Directives 08/29/2021 04/12/2021 08/23/2020 11/15/2019 06/01/2019 01/08/2019 12/24/2018  Does Patient Have a Medical Advance Directive? Yes Yes Yes Yes Yes Yes Yes  Type of Advance Directive Healthcare Power of [place] of [place];Living will Healthcare Power of [place];Living will [place];Living will  Does patient want to make changes to medical advance directive? - - - - No - Patient declined No - Patient declined -  Copy of [place] in Chart? - - Yes - validated most recent copy scanned in chart (See row information) - No - copy requested No - copy requested -    Current Medications (verified) Outpatient Encounter Medications as of 08/29/2021  Medication Sig   atorvastatin (LIPITOR) 40 MG tablet Take 1 tablet (40 mg total) by mouth daily. Please keep upcoming appt with Dr. Radford Pax in December 2022 before anymore refills. Thank you   carvedilol (COREG) 25 MG tablet TAKE  (1)  TABLET TWICE A DAY.   colchicine 0.6 MG tablet TAKE TWO TABLETS AT ONSET OF GOUT PAIN, REPEAT IN 1 HOUR, THEN 1 TABLET TWICE A DAY UNTIL RESOLVED   donepezil (ARICEPT) 10 MG tablet Take half tablet (5 mg) daily for 2 weeks, then increase to the full tablet at 10 mg daily   FLUoxetine (PROZAC) 20 MG capsule Take 20 mg by mouth daily.    sacubitril-valsartan (ENTRESTO) 97-103 MG Take 1 tablet by mouth 2 (two) times daily.   spironolactone (ALDACTONE) 25 MG tablet  Take 1 tablet (25 mg total) by mouth daily.   No facility-administered encounter medications on file as of 08/29/2021.    Allergies (verified) Penicillins   History: Past Medical History:  Diagnosis Date   Acute otitis media of left ear with perforated tympanic membrane 02/25/2014   Amnestic MCI (mild cognitive impairment with memory loss) 08/09/2021   Aortic insufficiency 10/21/2018   Moderate AI by echo 08/2018   Aortic regurgitation    Mild-mod on echo 07/2017.  Moderate 08/2018.  Moderate-severe on echo 04/2019--> TEE 11/15/19 EF 50-55%, no LV dilation or WMA. Mod AV sclerosis w/out stenosis, mod-to-sev AI-.>rpt echo 6 mo. 11/2020 moderate AI   Aortic root dilatation 10/2018   11/17/19 45 mm aortic root at the sinuses of Valsalva->Dr. Radford Pax referred pt to CT surgeon. 52022 echo->40 mm   Aortic stenosis, mild    Arthritis    L-Spine   Cardiac murmur    Systolic and diastolic as of 09/5595.   Cardiomyopathy 10/12/2010   Global hypokinesis, EF 40-45% by cath and echo (2012 echo and 2019 echo).  Feb 2020 EF 20-25%. - mixed ICM/NICM (alcohol and CAD)  October 2020 EF 25-30%. 11/2020 EF 40-45%.   Chronic systolic (congestive) heart failure ([place]) 06/01/2019   max med mgmt, plus CRT-D placed 05/2019, EF improved to 50% after.   Coronary artery disease 11/16/2010   a. 2012 - 80% OM lesion. b. Cath 12/2018 - showing 95% prox RCA, 75% OM1, 50% prox-mid  LAD. Unsuccessful PCI to 100% chronically occluded RCA summer 2020. CT angio chest w/aorta 08/2021->severe coronary calc's, no aneurism. Mild subclav stenosis d/t athero   Diverticulosis    Erectile dysfunction 08/16/2011   Essential hypertension 07/19/2010   GAD (generalized anxiety disorder)    managed by Dr. Toy Care with prozac 20mg  qd   Gout    Usually Right great toe; colchicine helps well   Gouty arthritis of toe of left foot 05/27/2013   Habitual alcohol use    Hx of colonoscopy 2004; 2014   Initial was normal; 2014-polypectomy and  diverticulosis.  Recall 2019.   Hyperlipidemia 10/23/2012   LBBB (left bundle branch block)    CRT-D placed 05/2019   Low back strain 10/24/2013   Mild neurocognitive disorder    Neurocognitive testing 2023.  Question early stage Alzheimer's   Obesity, Class I, BMI 30-34.9    Pulmonary nodule    0.5 cm RLL subpleural nodule.  Pt NEVER SMOKER.  Low risk->no f/u imaging is needed.   Subclavian artery stenosis, left (HCC)    mild, detected on CT angio chest w/aorta 08/2021   Thyroid nodule 04/23/2013   Past Surgical History:  Procedure Laterality Date   BIV ICD INSERTION CRT-D N/A 06/01/2019   Procedure: BIV ICD INSERTION CRT-D;  Surgeon: [name], MD;  Location: [facility];  Service: Cardiovascular;  Laterality: N/A;   CARDIAC CATHETERIZATION  10/12/10   diagnostic only: 1 vessel CAD with TIMI 3 flow, EF 40-45%.  Mild AS.  Medical mgmt of CAD.   COLONOSCOPY     COLONOSCOPY W/ POLYPECTOMY  10/06/12   Diverticulosis and tubular adenoma w/out high grade dysplasia.  Repeat 5 yrs (Dr. Deatra Ina).   CORONARY ATHERECTOMY N/A 01/08/2019   100% chronic RCA occlusion impossible to clear.  Med mgmt. Procedure: CORONARY ATHERECTOMY;  Surgeon: Sherren Mocha, MD;  Location: [facility];  Service: Cardiovascular;  Laterality: N/A;   POLYPECTOMY     RIGHT HEART CATH AND CORONARY ANGIOGRAPHY N/A 12/24/2018   95% RCA occlusion, 75% obtuse marginal occlusion (3V dz). Procedure: RIGHT HEART CATH AND CORONARY ANGIOGRAPHY;  Surgeon: Lorretta Harp, MD;  Location: [facility];  Service: Cardiovascular;  Laterality: N/A;   TEE WITHOUT CARDIOVERSION N/A 11/15/2019   EF 50-55%, no LV dilation or WMA. Mod AV sclerosis w/out stenosis, mod-to-sev AI.  Procedure: TRANSESOPHAGEAL ECHOCARDIOGRAM (TEE);  Surgeon: Fay Records, MD;  Location: [facility];  Service: Cardiovascular;  Laterality: N/A;   TRANSTHORACIC ECHOCARDIOGRAM  09/2010; 07/2017; 08/2018; 10/2019;05/2020   2012:  EF 40-45%, global  hypokinesis, mild AS.  07/2017: no interval chg except mild/mod AR.  08/2018 EF 20-25%, global LV hypok, DD, biatrial enlargemt, mod AR, aortic root 3.8 cm. 10/2019 EF 50%, mild dec RV fxn, mod-sev AR, aor root dil 42 mm. 05/2020 EF 45-50%,grd I DD, mod AI, aorta 88mm, severe LAE. 10/2020 EF 45-50%, mild LVH, global hypokin, grd I DD, mod AI.   Family History  Problem Relation Age of Onset   Congenital heart disease Brother        cardiomyopathy   Alzheimer's disease Mother    COPD Father    Heart failure Father        congestive   Colon cancer Neg Hx    Esophageal cancer Neg Hx    Stomach cancer Neg Hx    Rectal cancer Neg Hx    Colon polyps Neg Hx    Social History   Socioeconomic History   Marital  status: Married    Spouse name: Thayer Headings   Number of children: 2   Years of education: 16   Highest education level: Bachelor's degree (e.g., BA, AB, BS)  Occupational History   Occupation: Retired    Fish farm manager: [employer]  Tobacco Use   Smoking status: Former   Smokeless tobacco: Current    Types: Chew   Tobacco comments:    chews tobacco  Vaping Use   Vaping Use: Never used  Substance and Sexual Activity   Alcohol use: Yes    Alcohol/week: 28.0 standard drinks    Types: 28 Cans of beer per week    Comment: daily   Drug use: No   Sexual activity: Yes  Other Topics Concern   Not on file  Social History Narrative   Retired Education officer, community for dental lab.  Married, two children.   Pt denies ever smoking cigarettes.   +Alcohol.   Lives in [place].   Right handed   Drinks caffeine   Two story home   Social Determinants of Health   Financial Resource Strain: Low Risk    Difficulty of Paying Living Expenses: Not hard at all  Food Insecurity: No Food Insecurity   Worried About Charity fundraiser in the Last Year: Never true   Arboriculturist in the Last Year: Never true  Transportation Needs: No Transportation Needs   Lack of Transportation (Medical): No   Lack  of Transportation (Non-Medical): No  Physical Activity: Inactive   Days of Exercise per Week: 0 days   Minutes of Exercise per Session: 0 min  Stress: No Stress Concern Present   Feeling of Stress : Not at all  Social Connections: Unknown   Frequency of Communication with Friends and Family: Once a week   Frequency of Social Gatherings with Friends and Family: More than three times a week   Attends Religious Services: Not on Electrical engineer or Organizations: No   Attends Archivist Meetings: Never   Marital Status: Married    Tobacco Counseling Ready to quit: Not Answered Counseling given: Not Answered Tobacco comments: chews tobacco   Clinical Intake:  Pre-visit preparation completed: Yes  Pain : No/denies pain     BMI - recorded: 27.52 Nutritional Status: BMI 25 -29 Overweight Nutritional Risks: None Diabetes: No  How often do you need to have someone help you when you read instructions, pamphlets, or other written materials from your doctor or pharmacy?: 1 - Never  Diabetic?No  Interpreter Needed?: No  Information entered by :: Charlott Rakes, LPN   Activities of Daily Living In your present state of health, do you have any difficulty performing the following activities: 08/29/2021  Hearing? N  Vision? N  Difficulty concentrating or making decisions? N  Walking or climbing stairs? N  Dressing or bathing? N  Doing errands, shopping? N  Preparing Food and eating ? N  Using the Toilet? N  In the past six months, have you accidently leaked urine? N  Do you have problems with loss of bowel control? N  Managing your Medications? N  Managing your Finances? N  Housekeeping or managing your Housekeeping? N  Some recent data might be hidden    Patient Care Team: Tammi Sou, MD as PCP - General Sueanne Margarita, MD as PCP - Cardiology (Cardiology) [name], MD as PCP - Electrophysiology (Cardiology) Inda Castle, MD  (Inactive) as Consulting Physician (Gastroenterology) Chucky May,  MD as Consulting Physician (Psychiatry) Marlyce Huge (Dentistry)  Indicate any recent Medical Services you may have received from other than [facility] providers in the past year (date may be approximate).     Assessment:   This is a routine wellness examination for [place].  Hearing/Vision screen Hearing Screening - Comments:: Pt denies any hearing issues  Vision Screening - Comments:: Pt follows up with dr Truman Hayward for annual eye exams   Dietary issues and exercise activities discussed: Current Exercise Habits: The patient does not participate in regular exercise at present   Goals Addressed             This Visit's Progress    Patient Stated       None at this time        Depression Screen PHQ 2/9 Scores 08/29/2021 08/23/2020 09/21/2019 08/03/2018 01/30/2018 08/01/2017 08/01/2017  PHQ - 2 Score 0 0 0 0 0 0 0  PHQ- 9 Score - - - - 0 - -    Fall Risk Fall Risk  08/29/2021 04/12/2021 08/23/2020 09/21/2019 08/03/2018  Falls in the past year? 0 0 0 0 0  Number falls in past yr: 0 0 0 0 -  Injury with Fall? 0 0 0 0 -  Risk for fall due to : Impaired vision - - - -  Follow up Falls prevention discussed - Falls prevention discussed Falls evaluation completed -    FALL RISK PREVENTION PERTAINING TO THE HOME:  Any stairs in or around the home? Yes  If so, are there any without handrails? No  Home free of loose throw rugs in walkways, pet beds, electrical cords, etc? Yes  Adequate lighting in your home to reduce risk of falls? Yes   ASSISTIVE DEVICES UTILIZED TO PREVENT FALLS:  Life alert? No  Use of a cane, walker or w/c? No  Grab bars in the bathroom? no Shower chair or bench in shower? Yes  Elevated toilet seat or a handicapped toilet? No   TIMED UP AND GO:  Was the test performed? Yes .  Length of time to ambulate 10 feet: 10 sec.   Gait steady and fast without use of assistive device  Cognitive Function: MMSE -  Mini Mental State Exam 08/01/2017  Orientation to time 5  Orientation to Place 5  Registration 3  Attention/ Calculation 3  Recall 3  Language- name 2 objects 2  Language- repeat 1  Language- follow 3 step command 3  Language- read & follow direction 1  Write a sentence 1  Copy design 1  Total score 28     6CIT Screen 08/29/2021  What Year? 4 points  What month? 0 points  What time? 0 points  Count back from 20 0 points  Months in reverse 0 points  Repeat phrase 4 points  Total Score 8    Immunizations Immunization History  Administered Date(s) Administered   Fluad Quad(high Dose 65+) 03/23/2019, 04/03/2021   Influenza,inj,Quad PF,6+ Mos 04/23/2013   Influenza-Unspecified 04/21/2015, 04/11/2017, 05/01/2018, 04/17/2020   Moderna Covid-19 Vaccine Bivalent Booster 50yrs & up 04/03/2021   Moderna Sars-Covid-2 Vaccination 08/16/2019, 09/14/2019, 05/16/2020, 12/05/2020   Pneumococcal Conjugate-13 07/22/2014   Pneumococcal Polysaccharide-23 08/16/2011, 12/13/2016   Td 07/16/2007   Tdap 08/26/2017   Zoster Recombinat (Shingrix) 02/25/2017, 04/11/2017    TDAP status: Up to date  Flu Vaccine status: Up to date  Pneumococcal vaccine status: Up to date  Covid-19 vaccine status: Completed vaccines  Qualifies for Shingles Vaccine? Yes   Zostavax  completed Yes   Shingrix Completed?: Yes  Screening Tests Health Maintenance  Topic Date Due   COLONOSCOPY (Pts 45-71yrs Insurance coverage will need to be confirmed)  10/09/2017   TETANUS/TDAP  08/27/2027   Pneumonia Vaccine 26+ Years old  Completed   INFLUENZA VACCINE  Completed   COVID-19 Vaccine  Completed   Hepatitis C Screening  Completed   Zoster Vaccines- Shingrix  Completed   HPV VACCINES  Aged Out    Health Maintenance  Health Maintenance Due  Topic Date Due   COLONOSCOPY (Pts 45-60yrs Insurance coverage will need to be confirmed)  10/09/2017    Colorectal cancer screening: No longer required.  Per pt     Additional Screening:  Hepatitis C Screening: Completed 03/18/14  Vision Screening: Recommended annual ophthalmology exams for early detection of glaucoma and other disorders of the eye. Is the patient up to date with their annual eye exam?  Yes  Who is the provider or what is the name of the office in which the patient attends annual eye exams? Dr Truman Hayward  If pt is not established with a provider, would they like to be referred to a provider to establish care? No .   Dental Screening: Recommended annual dental exams for proper oral hygiene  Community Resource Referral / Chronic Care Management: CRR required this visit?  No   CCM required this visit?  No      Plan:     I have personally reviewed and noted the following in the patients chart:   Medical and social history Use of alcohol, tobacco or illicit drugs  Current medications and supplements including opioid prescriptions. Patient is not currently taking opioid prescriptions. Functional ability and status Nutritional status Physical activity Advanced directives List of other physicians Hospitalizations, surgeries, and ER visits in previous 12 months Vitals Screenings to include cognitive, depression, and falls Referrals and appointments  In addition, I have reviewed and discussed with patient certain preventive protocols, quality metrics, and best practice recommendations. A written personalized care plan for preventive services as well as general preventive health recommendations were provided to patient.     Willette Brace, LPN   [id number]/93/[id number]   Nurse Notes: Pt stated last time for colonoscopy did prep and was sent home related to CHF concerns please advise if pt needs another colonoscopy

## 2021-08-29 NOTE — Patient Instructions (Signed)
[name] , Thank you for taking time to come for your Medicare Wellness Visit. I appreciate your ongoing commitment to your health goals. Please review the following plan we discussed and let me know if I can assist you in the future.   Screening recommendations/referrals: Colonoscopy: No longer required per pt  Recommended yearly ophthalmology/optometry visit for glaucoma screening and checkup Recommended yearly dental visit for hygiene and checkup  Vaccinations: Influenza vaccine: Done 04/03/21 repeat every year  Pneumococcal vaccine: Up to date Tdap vaccine: Done 08/26/17 repeat every 10 years  Shingles vaccine: Completed 8/14 & 04/11/17   Covid-19: Completed 2/1, 3/2, 05/16/20 & 5/24, 04/03/21  Advanced directives: Please bring a copy of your health care power of attorney and living will to the office at your convenience.  Conditions/risks identified: none at this time   Next appointment: Follow up in one year for your annual wellness visit.   Preventive Care 11 Years and Older, Male Preventive care refers to lifestyle choices and visits with your health care provider that can promote health and wellness. What does preventive care include? A yearly physical exam. This is also called an annual well check. Dental exams once or twice a year. Routine eye exams. Ask your health care provider how often you should have your eyes checked. Personal lifestyle choices, including: Daily care of your teeth and gums. Regular physical activity. Eating a healthy diet. Avoiding tobacco and drug use. Limiting alcohol use. Practicing safe sex. Taking low doses of aspirin every day. Taking vitamin and mineral supplements as recommended by your health care provider. What happens during an annual well check? The services and screenings done by your health care provider during your annual well check will depend on your age, overall health, lifestyle risk factors, and family history of  disease. Counseling  Your health care provider may ask you questions about your: Alcohol use. Tobacco use. Drug use. Emotional well-being. Home and relationship well-being. Sexual activity. Eating habits. History of falls. Memory and ability to understand (cognition). Work and work Statistician. Screening  You may have the following tests or measurements: Height, weight, and BMI. Blood pressure. Lipid and cholesterol levels. These may be checked every 5 years, or more frequently if you are over 79 years old. Skin check. Lung cancer screening. You may have this screening every year starting at age 24 if you have a 30-pack-year history of smoking and currently smoke or have quit within the past 15 years. Fecal occult blood test (FOBT) of the stool. You may have this test every year starting at age 58. Flexible sigmoidoscopy or colonoscopy. You may have a sigmoidoscopy every 5 years or a colonoscopy every 10 years starting at age 14. Prostate cancer screening. Recommendations will vary depending on your family history and other risks. Hepatitis C blood test. Hepatitis B blood test. Sexually transmitted disease (STD) testing. Diabetes screening. This is done by checking your blood sugar (glucose) after you have not eaten for a while (fasting). You may have this done every 1-3 years. Abdominal aortic aneurysm (AAA) screening. You may need this if you are a current or former smoker. Osteoporosis. You may be screened starting at age 7 if you are at high risk. Talk with your health care provider about your test results, treatment options, and if necessary, the need for more tests. Vaccines  Your health care provider may recommend certain vaccines, such as: Influenza vaccine. This is recommended every year. Tetanus, diphtheria, and acellular pertussis (Tdap, Td) vaccine. You may need  a Td booster every 10 years. Zoster vaccine. You may need this after age 69. Pneumococcal 13-valent  conjugate (PCV13) vaccine. One dose is recommended after age 13. Pneumococcal polysaccharide (PPSV23) vaccine. One dose is recommended after age 58. Talk to your health care provider about which screenings and vaccines you need and how often you need them. This information is not intended to replace advice given to you by your health care provider. Make sure you discuss any questions you have with your health care provider. Document Released: 07/28/2015 Document Revised: 03/20/2016 Document Reviewed: 05/02/2015 Elsevier Interactive Patient Education  2017 [place] Prevention in the Home Falls can cause injuries. They can happen to people of all ages. There are many things you can do to make your home safe and to help prevent falls. What can I do on the outside of my home? Regularly fix the edges of walkways and driveways and fix any cracks. Remove anything that might make you trip as you walk through a door, such as a raised step or threshold. Trim any bushes or trees on the path to your home. Use bright outdoor lighting. Clear any walking paths of anything that might make someone trip, such as rocks or tools. Regularly check to see if handrails are loose or broken. Make sure that both sides of any steps have handrails. Any raised decks and porches should have guardrails on the edges. Have any leaves, snow, or ice cleared regularly. Use sand or salt on walking paths during winter. Clean up any spills in your garage right away. This includes oil or grease spills. What can I do in the bathroom? Use night lights. Install grab bars by the toilet and in the tub and shower. Do not use towel bars as grab bars. Use non-skid mats or decals in the tub or shower. If you need to sit down in the shower, use a plastic, non-slip stool. Keep the floor dry. Clean up any water that spills on the floor as soon as it happens. Remove soap buildup in the tub or shower regularly. Attach bath mats  securely with double-sided non-slip rug tape. Do not have throw rugs and other things on the floor that can make you trip. What can I do in the bedroom? Use night lights. Make sure that you have a light by your bed that is easy to reach. Do not use any sheets or blankets that are too big for your bed. They should not hang down onto the floor. Have a firm chair that has side arms. You can use this for support while you get dressed. Do not have throw rugs and other things on the floor that can make you trip. What can I do in the kitchen? Clean up any spills right away. Avoid walking on wet floors. Keep items that you use a lot in easy-to-reach places. If you need to reach something above you, use a strong step stool that has a grab bar. Keep electrical cords out of the way. Do not use floor polish or wax that makes floors slippery. If you must use wax, use non-skid floor wax. Do not have throw rugs and other things on the floor that can make you trip. What can I do with my stairs? Do not leave any items on the stairs. Make sure that there are handrails on both sides of the stairs and use them. Fix handrails that are broken or loose. Make sure that handrails are as long as the stairways. Check  any carpeting to make sure that it is firmly attached to the stairs. Fix any carpet that is loose or worn. Avoid having throw rugs at the top or bottom of the stairs. If you do have throw rugs, attach them to the floor with carpet tape. Make sure that you have a light switch at the top of the stairs and the bottom of the stairs. If you do not have them, ask someone to add them for you. What else can I do to help prevent falls? Wear shoes that: Do not have high heels. Have rubber bottoms. Are comfortable and fit you well. Are closed at the toe. Do not wear sandals. If you use a stepladder: Make sure that it is fully opened. Do not climb a closed stepladder. Make sure that both sides of the stepladder  are locked into place. Ask someone to hold it for you, if possible. Clearly mark and make sure that you can see: Any grab bars or handrails. First and last steps. Where the edge of each step is. Use tools that help you move around (mobility aids) if they are needed. These include: Canes. Walkers. Scooters. Crutches. Turn on the lights when you go into a dark area. Replace any light bulbs as soon as they burn out. Set up your furniture so you have a clear path. Avoid moving your furniture around. If any of your floors are uneven, fix them. If there are any pets around you, be aware of where they are. Review your medicines with your doctor. Some medicines can make you feel dizzy. This can increase your chance of falling. Ask your doctor what other things that you can do to help prevent falls. This information is not intended to replace advice given to you by your health care provider. Make sure you discuss any questions you have with your health care provider. Document Released: 04/27/2009 Document Revised: 12/07/2015 Document Reviewed: 08/05/2014 Elsevier Interactive Patient Education  2017 [employer].

## 2021-08-30 ENCOUNTER — Ambulatory Visit (INDEPENDENT_AMBULATORY_CARE_PROVIDER_SITE_OTHER): Payer: Medicare PPO

## 2021-08-30 DIAGNOSIS — I255 Ischemic cardiomyopathy: Secondary | ICD-10-CM | POA: Diagnosis not present

## 2021-08-30 DIAGNOSIS — I447 Left bundle-branch block, unspecified: Secondary | ICD-10-CM

## 2021-08-30 LAB — CUP PACEART REMOTE DEVICE CHECK
Battery Remaining Longevity: 68 mo
Battery Remaining Percentage: 72 %
Battery Voltage: 2.96 V
Brady Statistic AP VP Percent: 42 %
Brady Statistic AP VS Percent: 1 %
Brady Statistic AS VP Percent: 57 %
Brady Statistic AS VS Percent: 1 %
Brady Statistic RA Percent Paced: 41 %
Date Time Interrogation Session: 20230216020019
HighPow Impedance: 61 Ohm
Implantable Lead Implant Date: 20201117
Implantable Lead Implant Date: 20201117
Implantable Lead Implant Date: 20201117
Implantable Lead Location: 753858
Implantable Lead Location: 753859
Implantable Lead Location: 753860
Implantable Pulse Generator Implant Date: 20201117
Lead Channel Impedance Value: 400 Ohm
Lead Channel Impedance Value: 410 Ohm
Lead Channel Impedance Value: 750 Ohm
Lead Channel Pacing Threshold Amplitude: 0.625 V
Lead Channel Pacing Threshold Amplitude: 1 V
Lead Channel Pacing Threshold Amplitude: 1 V
Lead Channel Pacing Threshold Pulse Width: 0.5 ms
Lead Channel Pacing Threshold Pulse Width: 0.5 ms
Lead Channel Pacing Threshold Pulse Width: 0.5 ms
Lead Channel Sensing Intrinsic Amplitude: 1.3 mV
Lead Channel Sensing Intrinsic Amplitude: 11.8 mV
Lead Channel Setting Pacing Amplitude: 1.5 V
Lead Channel Setting Pacing Amplitude: 1.625
Lead Channel Setting Pacing Amplitude: 2 V
Lead Channel Setting Pacing Pulse Width: 0.5 ms
Lead Channel Setting Pacing Pulse Width: 0.5 ms
Lead Channel Setting Sensing Sensitivity: 0.5 mV
Pulse Gen Serial Number: 111012743

## 2021-09-04 ENCOUNTER — Other Ambulatory Visit: Payer: Self-pay

## 2021-09-04 ENCOUNTER — Ambulatory Visit: Payer: Medicare PPO | Admitting: Pharmacist

## 2021-09-04 VITALS — BP 80/44 | HR 68 | Wt 164.0 lb

## 2021-09-04 DIAGNOSIS — N529 Male erectile dysfunction, unspecified: Secondary | ICD-10-CM | POA: Diagnosis not present

## 2021-09-04 DIAGNOSIS — I2510 Atherosclerotic heart disease of native coronary artery without angina pectoris: Secondary | ICD-10-CM | POA: Diagnosis not present

## 2021-09-04 DIAGNOSIS — I5022 Chronic systolic (congestive) heart failure: Secondary | ICD-10-CM | POA: Diagnosis not present

## 2021-09-04 DIAGNOSIS — E785 Hyperlipidemia, unspecified: Secondary | ICD-10-CM | POA: Diagnosis not present

## 2021-09-04 DIAGNOSIS — I509 Heart failure, unspecified: Secondary | ICD-10-CM | POA: Diagnosis not present

## 2021-09-04 DIAGNOSIS — F1010 Alcohol abuse, uncomplicated: Secondary | ICD-10-CM | POA: Diagnosis not present

## 2021-09-04 DIAGNOSIS — I110 Hypertensive heart disease with heart failure: Secondary | ICD-10-CM | POA: Diagnosis not present

## 2021-09-04 DIAGNOSIS — E261 Secondary hyperaldosteronism: Secondary | ICD-10-CM | POA: Diagnosis not present

## 2021-09-04 DIAGNOSIS — I739 Peripheral vascular disease, unspecified: Secondary | ICD-10-CM | POA: Diagnosis not present

## 2021-09-04 DIAGNOSIS — I10 Essential (primary) hypertension: Secondary | ICD-10-CM | POA: Diagnosis not present

## 2021-09-04 DIAGNOSIS — F0390 Unspecified dementia without behavioral disturbance: Secondary | ICD-10-CM | POA: Diagnosis not present

## 2021-09-04 MED ORDER — METOPROLOL SUCCINATE ER 100 MG PO TB24: 100.0000 mg | ORAL_TABLET | Freq: Every day | ORAL | 5 refills | Status: DC

## 2021-09-04 NOTE — Patient Instructions (Addendum)
Your blood pressure goal is < 130/51mmHg  Stop taking carvedilol  Start taking metoprolol 100mg  - 1 tablet once daily  Continue taking your other medications as you have been  Monitor your blood pressure at home  [email or web address] has a list of validated blood pressure cuffs  Please bring your new cuff to your next visit

## 2021-09-04 NOTE — Progress Notes (Signed)
Remote ICD transmission.   

## 2021-09-04 NOTE — Progress Notes (Signed)
Patient ID: Anthony Daugherty                 DOB: 06-02-1946                      MRN: [id number]     HPI: Anthony Daugherty is a 76 y.o. male referred by Dr. Radford Pax to pharmacy clinic for CHF/HTN management. PMH is significant for CAD, HLD, HTN, ischemic cardiomyopathy and HFrEF, aortic valve disorder, ischemic  obesity, heavy alcohol use, and cognitive decline. LVEF via cath in 2012 was 40-45%. PT underwent another cath in 12/2018 showing 95% pRCA, 75% OM1 and 50% prox to mid LAD, unsuccessful PCI of the RCA. Many previous attempts to titrate HF meds limited by soft BPs. Repeat echo in 04/2019 showed an LVEF of 25-30% on Entresto 49/51mg  BID + carvedilol 25mg  BID. A BIV ICD was inserted in 05/2019. Most recent echo in 12/22 showed stable LVEF of 45-50%.    Pt last seen by PharmD on 1/30. He was taking Entresto and carvedilol only once daily instead of twice daily. He was not sure if he was taking HCTZ. He was advised to take his Entresto and carvedilol BID as prescribed.  Pt presents today with his wife for follow up. Reports tolerating meds well. Taking Entresto and carvedilol BID now. Denies dizziness, headache, LE edema, SOB, and chest pain. Brings in home Omron BP cuff today along with list of home readings.   Home readings: 127/60, 122/64, 113/53,  122/64, 100/64, 90/38, 116/53, 112/41, 102/55, 90/45, 128/62, 108/58. Checks at night right around when he takes his second Entresto and carvedilol dose  Home BP reading in office today: 105/52, 108/58 Clinic reading much lower at 80/44, checked multiple times.   Current HTN meds: Entresto 97-103mg  BID (9:30-10am, 8-9pm), carvedilol 25mg  BID, spironolactone 25mg  daily BP goal: <130/74mmHg   Family History: Alzheimer's disease in his mother; COPD in his father; Congenital heart disease in his brother; Heart failure in his father. There is no history of Colon cancer, Esophageal cancer, Stomach cancer, Rectal cancer, or Colon polyps.   Social  History: former smoker, current smokeless tobacco - chews, heavy alcohol use - 28 cans beer/week  Wt Readings from Last 3 Encounters:  08/29/21 165 lb 6.4 oz (75 kg)  07/27/21 170 lb (77.1 kg)  04/12/21 167 lb (75.8 kg)   BP Readings from Last 3 Encounters:  08/29/21 100/64  07/27/21 (!) 158/77  04/12/21 (!) 159/85   Pulse Readings from Last 3 Encounters:  08/29/21 64  08/13/21 74  07/27/21 65    Renal function: CrCl cannot be calculated (Patient's most recent lab result is older than the maximum 21 days allowed.).  Past Medical History:  Diagnosis Date   Acute otitis media of left ear with perforated tympanic membrane 02/25/2014   Amnestic MCI (mild cognitive impairment with memory loss) 08/09/2021   Aortic insufficiency 10/21/2018   Moderate AI by echo 08/2018   Aortic regurgitation    Mild-mod on echo 07/2017.  Moderate 08/2018.  Moderate-severe on echo 04/2019--> TEE 11/15/19 EF 50-55%, no LV dilation or WMA. Mod AV sclerosis w/out stenosis, mod-to-sev AI-.>rpt echo 6 mo. 11/2020 moderate AI   Aortic root dilatation 10/2018   11/17/19 45 mm aortic root at the sinuses of Valsalva->Dr. Radford Pax referred pt to CT surgeon. 52022 echo->40 mm   Aortic stenosis, mild    Arthritis    L-Spine   Cardiac murmur    Systolic and diastolic as of  07/2017.   Cardiomyopathy 10/12/2010   Global hypokinesis, EF 40-45% by cath and echo (2012 echo and 2019 echo).  Feb 2020 EF 20-25%. - mixed ICM/NICM (alcohol and CAD)  October 2020 EF 25-30%. 11/2020 EF 40-45%.   Chronic systolic (congestive) heart failure ([facility]) 06/01/2019   max med mgmt, plus CRT-D placed 05/2019, EF improved to 50% after.   Coronary artery disease 11/16/2010   a. 2012 - 80% OM lesion. b. Cath 12/2018 - showing 95% prox RCA, 75% OM1, 50% prox-mid LAD. Unsuccessful PCI to 100% chronically occluded RCA summer 2020. CT angio chest w/aorta 08/2021->severe coronary calc's, no aneurism. Mild subclav stenosis d/t athero   Diverticulosis     Erectile dysfunction 08/16/2011   Essential hypertension 07/19/2010   GAD (generalized anxiety disorder)    managed by Dr. Toy Care with prozac 20mg  qd   Gout    Usually Right great toe; colchicine helps well   Gouty arthritis of toe of left foot 05/27/2013   Habitual alcohol use    Hx of colonoscopy 2004; 2014   Initial was normal; 2014-polypectomy and diverticulosis.  Recall 2019.   Hyperlipidemia 10/23/2012   LBBB (left bundle branch block)    CRT-D placed 05/2019   Low back strain 10/24/2013   Mild neurocognitive disorder    Neurocognitive testing 2023.  Question early stage Alzheimer's   Obesity, Class I, BMI 30-34.9    Pulmonary nodule    0.5 cm RLL subpleural nodule.  Pt NEVER SMOKER.  Low risk->no f/u imaging is needed.   Subclavian artery stenosis, left ([facility])    mild, detected on CT angio chest w/aorta 08/2021   Thyroid nodule 04/23/2013    Current Outpatient Medications on File Prior to Visit  Medication Sig Dispense Refill   atorvastatin (LIPITOR) 40 MG tablet Take 1 tablet (40 mg total) by mouth daily. Please keep upcoming appt with Dr. Radford Pax in December 2022 before anymore refills. Thank you 90 tablet 0   carvedilol (COREG) 25 MG tablet TAKE  (1)  TABLET TWICE A DAY. 180 tablet 2   colchicine 0.6 MG tablet TAKE TWO TABLETS AT ONSET OF GOUT PAIN, REPEAT IN 1 HOUR, THEN 1 TABLET TWICE A DAY UNTIL RESOLVED 30 tablet 1   donepezil (ARICEPT) 10 MG tablet Take half tablet (5 mg) daily for 2 weeks, then increase to the full tablet at 10 mg daily 30 tablet 11   FLUoxetine (PROZAC) 20 MG capsule Take 20 mg by mouth daily.      sacubitril-valsartan (ENTRESTO) 97-103 MG Take 1 tablet by mouth 2 (two) times daily. 90 tablet 3   spironolactone (ALDACTONE) 25 MG tablet Take 1 tablet (25 mg total) by mouth daily. 90 tablet 1   No current facility-administered medications on file prior to visit.    Allergies  Allergen Reactions   Penicillins Swelling    Did it involve swelling of the  face/tongue/throat, SOB, or low BP? No Did it involve sudden or severe rash/hives, skin peeling, or any reaction on the inside of your mouth or nose? No Did you need to seek medical attention at a hospital or doctor's office? No When did it last happen? ~10 years ago If all above answers are NO, may proceed with cephalosporin use.      Assessment/Plan:  1. CHF - BP very low today at 80/44. Home cuff measured higher at 105/52. Pt will look for new BP cuff and bring to next visit since accuracy of current cuff is off. In the mean time,  will stop carvedilol 25mg  BID and replace with lower dose of Toprol 100mg  daily for continued CHF benefit but less effect on BP. He will continue Entresto 97-103mg  BID and spironolactone 25mg  daily for now. Checking BMET today with frequency change of Entresto (had only  been taking once daily previously). Will follow up closely in 2 weeks for BP check. May need to decrease spironolactone or Entresto dose pending future BP readings. Once BP has stabilized, will plan to add on Jardiance 10mg  daily for CHF ($80/3 month supply like his Delene Loll is).  Takashi Korol E. Rylend Pietrzak, PharmD, BCACP, [facility] [address] [place], [place] [address] Phone: [phone]; Fax: [phone] 09/04/2021 2:58 PM

## 2021-09-05 LAB — BASIC METABOLIC PANEL
BUN/Creatinine Ratio: 27 — ABNORMAL HIGH (ref 10–24)
BUN: 26 mg/dL (ref 8–27)
CO2: 22 mmol/L (ref 20–29)
Calcium: 9 mg/dL (ref 8.6–10.2)
Chloride: 105 mmol/L (ref 96–106)
Creatinine, Ser: 0.96 mg/dL (ref 0.76–1.27)
Glucose: 107 mg/dL — ABNORMAL HIGH (ref 70–99)
Potassium: 4.2 mmol/L (ref 3.5–5.2)
Sodium: 140 mmol/L (ref 134–144)
eGFR: 82 mL/min/{1.73_m2} (ref >59)

## 2021-09-10 ENCOUNTER — Other Ambulatory Visit: Payer: Self-pay | Admitting: Cardiology

## 2021-09-10 ENCOUNTER — Ambulatory Visit (INDEPENDENT_AMBULATORY_CARE_PROVIDER_SITE_OTHER): Payer: Medicare PPO

## 2021-09-10 DIAGNOSIS — Z95810 Presence of automatic (implantable) cardiac defibrillator: Secondary | ICD-10-CM | POA: Diagnosis not present

## 2021-09-10 DIAGNOSIS — I5022 Chronic systolic (congestive) heart failure: Secondary | ICD-10-CM
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
## 2021-09-11 ENCOUNTER — Telehealth: Payer: Self-pay | Admitting: Physician Assistant

## 2021-09-11 ENCOUNTER — Encounter: Payer: Self-pay | Admitting: Physician Assistant

## 2021-09-11 ENCOUNTER — Other Ambulatory Visit: Payer: Self-pay

## 2021-09-11 ENCOUNTER — Ambulatory Visit: Payer: Medicare PPO | Admitting: Physician Assistant

## 2021-09-11 VITALS — BP 129/78 | HR 69 | Resp 18 | Ht 67.0 in | Wt 165.0 lb

## 2021-09-11 DIAGNOSIS — F1010 Alcohol abuse, uncomplicated: Secondary | ICD-10-CM

## 2021-09-11 DIAGNOSIS — G3184 Mild cognitive impairment, so stated: Secondary | ICD-10-CM

## 2021-09-11 MED ORDER — DONEPEZIL HCL 10 MG PO TABS: ORAL_TABLET | ORAL | 11 refills | Status: DC

## 2021-09-11 NOTE — Telephone Encounter (Signed)
Awaiting approval notified wife

## 2021-09-11 NOTE — Patient Instructions (Addendum)
It was a pleasure to see you today at our office.   Recommendations: MRI of the brain, the radiology office will call you to arrange you appointment, after confirming that you are compatible  Continue Donepezil 10 mg  daily  Discontinue alcohol Follow with Psychiatry  Check labs by PCP Follow up in 6 months   RECOMMENDATIONS FOR ALL PATIENTS WITH MEMORY PROBLEMS: 1. Continue to exercise (Recommend 30 minutes of walking everyday, or 3 hours every week) 2. Increase social interactions - continue going to [place] and enjoy social gatherings with friends and family 3. Eat healthy, avoid fried foods and eat more fruits and vegetables 4. Maintain adequate blood pressure, blood sugar, and blood cholesterol level. Reducing the risk of stroke and cardiovascular disease also helps promoting better memory. 5. Avoid stressful situations. Live a simple life and avoid aggravations. Organize your time and prepare for the next day in anticipation. 6. Sleep well, avoid any interruptions of sleep and avoid any distractions in the bedroom that may interfere with adequate sleep quality 7. Avoid sugar, avoid sweets as there is a strong link between excessive sugar intake, diabetes, and cognitive impairment We discussed the Mediterranean diet, which has been shown to help patients reduce the risk of progressive memory disorders and reduces cardiovascular risk. This includes eating fish, eat fruits and green leafy vegetables, nuts like almonds and hazelnuts, walnuts, and also use olive oil. Avoid fast foods and fried foods as much as possible. Avoid sweets and sugar as sugar use has been linked to worsening of memory function.  There is always a concern of gradual progression of memory problems. If this is the case, then we may need to adjust level of care according to patient needs. Support, both to the patient and caregiver, should then be put into place.    FALL PRECAUTIONS: Be cautious when walking. Scan the area  for obstacles that may increase the risk of trips and falls. When getting up in the mornings, sit up at the edge of the bed for a few minutes before getting out of bed. Consider elevating the bed at the head end to avoid drop of blood pressure when getting up. Walk always in a well-lit room (use night lights in the walls). Avoid area rugs or power cords from appliances in the middle of the walkways. Use a walker or a cane if necessary and consider physical therapy for balance exercise. Get your eyesight checked regularly.  FINANCIAL OVERSIGHT: Supervision, especially oversight when making financial decisions or transactions is also recommended.  HOME SAFETY: Consider the safety of the kitchen when operating appliances like stoves, microwave oven, and blender. Consider having supervision and share cooking responsibilities until no longer able to participate in those. Accidents with firearms and other hazards in the house should be identified and addressed as well.   ABILITY TO BE LEFT ALONE: If patient is unable to contact 911 operator, consider using LifeLine, or when the need is there, arrange for someone to stay with patients. Smoking is a fire hazard, consider supervision or cessation. Risk of wandering should be assessed by caregiver and if detected at any point, supervision and safe proof recommendations should be instituted.  MEDICATION SUPERVISION: Inability to self-administer medication needs to be constantly addressed. Implement a mechanism to ensure safe administration of the medications.   DRIVING: Regarding driving, in patients with progressive memory problems, driving will be impaired. We advise to have someone else do the driving if trouble finding directions or if minor accidents are  reported. Independent driving assessment is available to determine safety of driving.   If you are interested in the driving assessment, you can contact the following:  [employer] in  [place]  [place] [phone] or [phone]    [place] refers to food and lifestyle choices that are based on the traditions of countries located on the [employer]. This way of eating has been shown to help prevent certain conditions and improve outcomes for people who have chronic diseases, like kidney disease and heart disease. What are tips for following this plan? Lifestyle  Cook and eat meals together with your family, when possible. Drink enough fluid to keep your urine clear or pale yellow. Be physically active every day. This includes: Aerobic exercise like running or swimming. Leisure activities like gardening, walking, or housework. Get 7-8 hours of sleep each night. If recommended by your health care provider, drink red wine in moderation. This means 1 glass a day for nonpregnant women and 2 glasses a day for men. A glass of wine equals 5 oz (150 mL). Reading food labels  Check the serving size of packaged foods. For foods such as rice and pasta, the serving size refers to the amount of cooked product, not dry. Check the total fat in packaged foods. Avoid foods that have saturated fat or trans fats. Check the ingredients list for added sugars, such as corn syrup. Shopping  At the grocery store, buy most of your food from the areas near the walls of the store. This includes: Fresh fruits and vegetables (produce). Grains, beans, nuts, and seeds. Some of these may be available in unpackaged forms or large amounts (in bulk). Fresh seafood. Poultry and eggs. Low-fat dairy products. Buy whole ingredients instead of prepackaged foods. Buy fresh fruits and vegetables in-season from local farmers markets. Buy frozen fruits and vegetables in resealable bags. If you do not have access to quality fresh seafood, buy precooked frozen shrimp or  canned fish, such as tuna, salmon, or sardines. Buy small amounts of raw or cooked vegetables, salads, or olives from the deli or salad bar at your store. Stock your pantry so you always have certain foods on hand, such as olive oil, canned tuna, canned tomatoes, rice, pasta, and beans. Cooking  Cook foods with extra-virgin olive oil instead of using butter or other vegetable oils. Have meat as a side dish, and have vegetables or grains as your main dish. This means having meat in small portions or adding small amounts of meat to foods like pasta or stew. Use beans or vegetables instead of meat in common dishes like chili or lasagna. Experiment with different cooking methods. Try roasting or broiling vegetables instead of steaming or sauteing them. Add frozen vegetables to soups, stews, pasta, or rice. Add nuts or seeds for added healthy fat at each meal. You can add these to yogurt, salads, or vegetable dishes. Marinate fish or vegetables using olive oil, lemon juice, garlic, and fresh herbs. Meal planning  Plan to eat 1 vegetarian meal one day each week. Try to work up to 2 vegetarian meals, if possible. Eat seafood 2 or more times a week. Have healthy snacks readily available, such as: Vegetable sticks with hummus. Greek yogurt. Fruit and nut trail mix. Eat balanced meals throughout the week. This includes: Fruit: 2-3 servings a day Vegetables: 4-5 servings a day Low-fat dairy: 2 servings a day Fish, poultry,  or lean meat: 1 serving a day Beans and legumes: 2 or more servings a week Nuts and seeds: 1-2 servings a day Whole grains: 6-8 servings a day Extra-virgin olive oil: 3-4 servings a day Limit red meat and sweets to only a few servings a month What are my food choices? Mediterranean diet Recommended Grains: Whole-grain pasta. Brown rice. Bulgar wheat. Polenta. Couscous. Whole-wheat bread. [place]. Vegetables: Artichokes. Beets. Broccoli. Cabbage. Carrots. Eggplant.  Green beans. Chard. Kale. Spinach. Onions. Leeks. Peas. Squash. Tomatoes. Peppers. Radishes. Fruits: Apples. Apricots. Avocado. Berries. Bananas. Cherries. Dates. Figs. Grapes. Lemons. Melon. Oranges. Peaches. Plums. Pomegranate. Meats and other protein foods: Beans. Almonds. Sunflower seeds. Pine nuts. Peanuts. [place]. Salmon. Scallops. Shrimp. [place]. Tilapia. Clams. Oysters. Eggs. Dairy: Low-fat milk. Cheese. Greek yogurt. Beverages: Water. Red wine. Herbal tea. Fats and oils: Extra virgin olive oil. Avocado oil. Grape seed oil. Sweets and desserts: [place] yogurt with honey. Baked apples. Poached pears. Trail mix. Seasoning and other foods: Basil. Cilantro. Coriander. Cumin. Mint. Parsley. Sage. Rosemary. Tarragon. Garlic. Oregano. Thyme. Pepper. Balsalmic vinegar. Tahini. Hummus. Tomato sauce. Olives. Mushrooms. Limit these Grains: Prepackaged pasta or rice dishes. Prepackaged cereal with added sugar. Vegetables: Deep fried potatoes (french fries). Fruits: Fruit canned in syrup. Meats and other protein foods: Beef. Pork. Lamb. Poultry with skin. Hot dogs. [place] Salines. Dairy: Ice cream. Sour cream. Whole milk. Beverages: Juice. Sugar-sweetened soft drinks. Beer. Liquor and spirits. Fats and oils: Butter. Canola oil. Vegetable oil. Beef fat (tallow). Lard. Sweets and desserts: Cookies. Cakes. Pies. Candy. Seasoning and other foods: Mayonnaise. Premade sauces and marinades. The items listed may not be a complete list. Talk with your dietitian about what dietary choices are right for you. Summary The Mediterranean diet includes both food and lifestyle choices. Eat a variety of fresh fruits and vegetables, beans, nuts, seeds, and whole grains. Limit the amount of red meat and sweets that you eat. Talk with your health care provider about whether it is safe for you to drink red wine in moderation. This means 1 glass a day for nonpregnant women and 2 glasses a day for men. A glass of wine equals 5 oz (150  mL). This information is not intended to replace advice given to you by your health care provider. Make sure you discuss any questions you have with your health care provider. Document Released: 02/22/2016 Document Revised: 03/26/2016 Document Reviewed: 02/22/2016 Elsevier Interactive Patient Education  2017 [employer].   We have sent a referral to [facility] for your MRI and they will call you directly to schedule your appointment. They are located at [place]. If you need to contact them directly please call [phone].   Your provider has requested that you have labwork completed today. Please go to [facility] ([id number]) on the second floor of this building before leaving the office today. You do not need to check in. If you are not called within 15 minutes please check with the front desk.

## 2021-09-11 NOTE — Telephone Encounter (Signed)
Patients wife would like a call back from christy.

## 2021-09-14 NOTE — Progress Notes (Signed)
EPIC Encounter for ICM Monitoring  Patient Name: NIVIN BRANIFF is a 76 y.o. male Date: 09/14/2021 Primary Care Physican: Tammi Sou, MD Primary Cardiologist: Radford Pax Electrophysiologist: Allred Bi-V Pacing: 99%         07/27/2021 Office Weight: 170 lbs  AT/AF Burden: <1% SVT Episodes: 2 NSVT Episodes: 3                                                            Spoke with wife per DPR and heart failure questions reviewed.  Pt asymptomatic for fluid accumulation.  He does not restrict salt intake and adds salt to his foods.  Explained how to much salt can contribute to fluid retention.   CorVue thoracic impedance suggesting fluid levels returned to normal.   Prescribed: Spironolactone 25 mg take 0.5 tablet daily.   Labs: 09/04/2021 Creatinine 0.96, BUN 26, Potassium 4.2, Sodium 140, GFR 82 04/03/2021 Creatinine 0.87, BUN 12, Potassium 3.9, Sodium 140, GFR 84.95 09/29/2020 Creatinine 1.02, BUN 16, Potassium 4.2, Sodium 142, GFR 72.62 A complete set of results can be found in Results Review.   Recommendations: Recommendation to limit salt intake to 2000 mg daily and fluid intake to 64 oz daily.  Encouraged to call if experiencing any fluid symptoms.    Follow-up plan: ICM clinic phone appointment on 10/15/2021.   91 day device clinic remote transmission 11/29/2021.   EP/Cardiology Office Visits: 12/17/2021 with Dr Burt Knack.  01/07/2022 with Dr Rayann Heman.     Copy of ICM check sent to Dr. Rayann Heman.    3 month ICM trend: 09/10/2021.    12-14 Month ICM trend:     Rosalene Billings, RN 09/14/2021 1:57 PM

## 2021-09-18 ENCOUNTER — Other Ambulatory Visit: Payer: Self-pay

## 2021-09-18 ENCOUNTER — Ambulatory Visit: Payer: Medicare PPO | Admitting: Pharmacist

## 2021-09-18 VITALS — BP 124/64 | HR 64 | Wt 164.4 lb

## 2021-09-18 DIAGNOSIS — I5022 Chronic systolic (congestive) heart failure: Secondary | ICD-10-CM | POA: Diagnosis not present

## 2021-09-18 MED ORDER — EMPAGLIFLOZIN 10 MG PO TABS: 10.0000 mg | ORAL_TABLET | Freq: Every day | ORAL | 3 refills | Status: DC

## 2021-09-18 NOTE — Progress Notes (Signed)
Patient ID: Anthony Daugherty                 DOB: 02-16-1946                      MRN: [id number] ? ? ? ? ?HPI: ?Anthony Daugherty is a 76 y.o. male referred by Dr. Radford Pax to pharmacy clinic for CHF/HTN management. PMH is significant for CAD, HLD, HTN, ischemic cardiomyopathy and HFrEF, aortic valve disorder, ischemic  obesity, heavy alcohol use, and cognitive decline. LVEF via cath in 2012 was 40-45%. PT underwent another cath in 12/2018 showing 95% pRCA, 75% OM1 and 50% prox to mid LAD, unsuccessful PCI of the RCA. Many previous attempts to titrate HF meds limited by soft BPs. Repeat echo in 04/2019 showed an LVEF of 25-30% on Entresto 49/'51mg'$  BID + carvedilol '25mg'$  BID. A BIV ICD was inserted in 05/2019. Most recent echo in 12/22 showed stable LVEF of 45-50%. At last visit with me on 2/21, BP was low at 80/44, home readings had been running higher. His carvedilol was changed to lower dose of Toprol for less effect on BP. BP was normal at 129/78 at neuro follow up on 2/28. ? ?Pt presents today with his wife for follow up. Reports tolerating meds well. Denies dizziness, headache, LE edema, and SOB. Brings in a log of home readings since purchasing new cuff after last visit (old cuff was measuring high). Home readings: 110/61, 146/73, 109/61, 126/67, 135/67, 119/67. ? ?Home reading in clinic - 120/71, HR 64. Recheck 128/74, HR 63 ?Clinic reading - 124/64 ?  ?Current HTN meds: Entresto 97-'103mg'$  BID (9:30-10am, 8-9pm), Toprol '100mg'$  daily, spironolactone '25mg'$  daily ?Previously tried: carvedilol '25mg'$  BID - hypotension ?BP goal: <130/30mHg ?  ?Family History: Alzheimer's disease in his mother; COPD in his father; Congenital heart disease in his brother; Heart failure in his father. There is no history of Colon cancer, Esophageal cancer, Stomach cancer, Rectal cancer, or Colon polyps. ?  ?Social History: former smoker, current smokeless tobacco - chews, heavy alcohol use - 28 cans beer/week ? ?Wt Readings from Last 3  Encounters:  ?09/11/21 165 lb (74.8 kg)  ?09/04/21 164 lb (74.4 kg)  ?08/29/21 165 lb 6.4 oz (75 kg)  ? ?BP Readings from Last 3 Encounters:  ?09/11/21 129/78  ?09/04/21 (!) 80/44  ?08/29/21 100/64  ? ?Pulse Readings from Last 3 Encounters:  ?09/11/21 69  ?09/04/21 68  ?08/29/21 64  ? ? ?Renal function: ?Estimated Creatinine Clearance: 62.2 mL/min (by C-G formula based on SCr of 0.96 mg/dL). ? ?Past Medical History:  ?Diagnosis Date  ? Acute otitis media of left ear with perforated tympanic membrane 02/25/2014  ? Amnestic MCI (mild cognitive impairment with memory loss) 08/09/2021  ? Aortic insufficiency 10/21/2018  ? Moderate AI by echo 08/2018  ? Aortic regurgitation   ? Mild-mod on echo 07/2017.  Moderate 08/2018.  Moderate-severe on echo 04/2019--> TEE 11/15/19 EF 50-55%, no LV dilation or WMA. Mod AV sclerosis w/out stenosis, mod-to-sev AI-.>rpt echo 6 mo. 11/2020 moderate AI  ? Aortic root dilatation 10/2018  ? 11/17/19 45 mm aortic root at the sinuses of Valsalva->Dr. TRadford Paxreferred pt to CT surgeon. 52022 echo->40 mm  ? Aortic stenosis, mild   ? Arthritis   ? L-Spine  ? Cardiac murmur   ? Systolic and diastolic as of 13/7628  ? Cardiomyopathy 10/12/2010  ? Global hypokinesis, EF 40-45% by cath and echo (2012 echo and 2019 echo).  Feb 2020 EF  20-25%. - mixed ICM/NICM (alcohol and CAD)  October 2020 EF 25-30%. 11/2020 EF 40-45%.  ? Chronic systolic (congestive) heart failure ([place]) 06/01/2019  ? max med mgmt, plus CRT-D placed 05/2019, EF improved to 50% after.  ? Coronary artery disease 11/16/2010  ? a. 2012 - 80% OM lesion. b. Cath 12/2018 - showing 95% prox RCA, 75% OM1, 50% prox-mid LAD. Unsuccessful PCI to 100% chronically occluded RCA summer 2020. CT angio chest w/aorta 08/2021->severe coronary calc's, no aneurism. Mild subclav stenosis d/t athero  ? Diverticulosis   ? Erectile dysfunction 08/16/2011  ? Essential hypertension 07/19/2010  ? GAD (generalized anxiety disorder)   ? managed by Dr. Toy Care with prozac '20mg'$   qd  ? Gout   ? Usually Right great toe; colchicine helps well  ? Gouty arthritis of toe of left foot 05/27/2013  ? Habitual alcohol use   ? Hx of colonoscopy 2004; 2014  ? Initial was normal; 2014-polypectomy and diverticulosis.  Recall 2019.  ? Hyperlipidemia 10/23/2012  ? LBBB (left bundle branch block)   ? CRT-D placed 05/2019  ? Low back strain 10/24/2013  ? Mild neurocognitive disorder   ? Neurocognitive testing 2023.  Question early stage Alzheimer's  ? Obesity, Class I, BMI 30-34.9   ? Pulmonary nodule   ? 0.5 cm RLL subpleural nodule.  Pt NEVER SMOKER.  Low risk->no f/u imaging is needed.  ? Subclavian artery stenosis, left ([place])   ? mild, detected on CT angio chest w/aorta 08/2021  ? Thyroid nodule 04/23/2013  ? ? ?Current Outpatient Medications on File Prior to Visit  ?Medication Sig Dispense Refill  ? aspirin EC 81 MG tablet Take 81 mg by mouth daily. Swallow whole.    ? atorvastatin (LIPITOR) 40 MG tablet TAKE 1 TABLET DAILY 90 tablet 3  ? colchicine 0.6 MG tablet TAKE TWO TABLETS AT ONSET OF GOUT PAIN, REPEAT IN 1 HOUR, THEN 1 TABLET TWICE A DAY UNTIL RESOLVED 30 tablet 1  ? donepezil (ARICEPT) 10 MG tablet Take 1 tablet 10 mg daily 30 tablet 11  ? FLUoxetine (PROZAC) 20 MG capsule Take 20 mg by mouth daily.    ? metoprolol succinate (TOPROL XL) 100 MG 24 hr tablet Take 1 tablet (100 mg total) by mouth daily. Take with or immediately following a meal. 30 tablet 5  ? sacubitril-valsartan (ENTRESTO) 97-103 MG Take 1 tablet by mouth 2 (two) times daily. 90 tablet 3  ? spironolactone (ALDACTONE) 25 MG tablet Take 1 tablet (25 mg total) by mouth daily. 90 tablet 1  ? ?No current facility-administered medications on file prior to visit.  ? ? ?Allergies  ?Allergen Reactions  ? Penicillins Swelling  ?  Did it involve swelling of the face/tongue/throat, SOB, or low BP? No ?Did it involve sudden or severe rash/hives, skin peeling, or any reaction on the inside of your mouth or nose? No ?Did you need to seek  medical attention at a hospital or doctor's office? No ?When did it last happen? ~10 years ago ?If all above answers are ?NO?, may proceed with cephalosporin use. ?  ? ? ? ?Assessment/Plan: ? ?1. CHF - BP improved from last visit and remains at goal < 130/30mHg. New cuff is measuring accurately. Will start Jardiance '10mg'$  daily for CHF. Discussed anticipated copay of $80/3 month, pt and his wife are ok with this. Other CHF meds are optimized - will continue Entresto 97-'103mg'$  BID, Toprol '100mg'$  daily (HR low 60s), and spironolactone '25mg'$  daily. F/u BMET scheduled in 4-6 weeks. ? ?  Loda Bialas E. Theodora Lalanne, PharmD, BCACP, CPP ?[id number], [place], [place] [address] ?Phone: [phone]; Fax: [phone] ?09/18/2021 7:36 AM ? ? ?

## 2021-09-18 NOTE — Patient Instructions (Signed)
It was nice to see you today ? ?Your blood pressure is excellent and at goal < 130/29mHg ? ?Start Jardiance '10mg'$  - 1 tablet once daily in the morning ? ?Continue taking your other medications ? ?Recheck labs on April 11th any time between 7:30am and 5pm ?

## 2021-10-02 ENCOUNTER — Encounter: Payer: Medicare PPO | Admitting: Family Medicine

## 2021-10-04 ENCOUNTER — Encounter: Payer: Self-pay | Admitting: Family Medicine

## 2021-10-04 ENCOUNTER — Telehealth: Payer: Self-pay

## 2021-10-04 ENCOUNTER — Other Ambulatory Visit: Payer: Self-pay

## 2021-10-04 ENCOUNTER — Ambulatory Visit (INDEPENDENT_AMBULATORY_CARE_PROVIDER_SITE_OTHER): Payer: Medicare PPO | Admitting: Family Medicine

## 2021-10-04 VITALS — BP 139/70 | HR 64 | Temp 97.5°F | Ht 65.5 in | Wt 163.6 lb

## 2021-10-04 DIAGNOSIS — Z0000 Encounter for general adult medical examination without abnormal findings: Secondary | ICD-10-CM | POA: Diagnosis not present

## 2021-10-04 DIAGNOSIS — F1090 Alcohol use, unspecified, uncomplicated: Secondary | ICD-10-CM

## 2021-10-04 DIAGNOSIS — E538 Deficiency of other specified B group vitamins: Secondary | ICD-10-CM | POA: Diagnosis not present

## 2021-10-04 DIAGNOSIS — E785 Hyperlipidemia, unspecified: Secondary | ICD-10-CM | POA: Diagnosis not present

## 2021-10-04 DIAGNOSIS — I10 Essential (primary) hypertension: Secondary | ICD-10-CM | POA: Diagnosis not present

## 2021-10-04 LAB — COMPREHENSIVE METABOLIC PANEL
ALT: 10 U/L (ref 0–53)
AST: 14 U/L (ref 0–37)
Albumin: 4.2 g/dL (ref 3.5–5.2)
Alkaline Phosphatase: 65 U/L (ref 39–117)
BUN: 14 mg/dL (ref 6–23)
CO2: 28 mEq/L (ref 19–32)
Calcium: 8.5 mg/dL (ref 8.4–10.5)
Chloride: 106 mEq/L (ref 96–112)
Creatinine, Ser: 0.87 mg/dL (ref 0.40–1.50)
GFR: 84.65 mL/min (ref >60.00)
Glucose, Bld: 79 mg/dL (ref 70–99)
Potassium: 3.7 mEq/L (ref 3.5–5.1)
Sodium: 140 mEq/L (ref 135–145)
Total Bilirubin: 0.9 mg/dL (ref 0.2–1.2)
Total Protein: 6 g/dL (ref 6.0–8.3)

## 2021-10-04 LAB — LIPID PANEL
Cholesterol: 127 mg/dL (ref 0–200)
HDL: 61.3 mg/dL (ref >39.00)
LDL Cholesterol: 57 mg/dL (ref 0–99)
NonHDL: 65.52
Total CHOL/HDL Ratio: 2
Triglycerides: 42 mg/dL (ref 0.0–149.0)
VLDL: 8.4 mg/dL (ref 0.0–40.0)

## 2021-10-04 LAB — B12 AND FOLATE PANEL
Folate: 12.3 ng/mL (ref >5.9)
Vitamin B-12: 315 pg/mL (ref 211–911)

## 2021-10-04 MED ORDER — FOLIC ACID 0.8 MG PO CAPS: ORAL_CAPSULE | ORAL | 3 refills | Status: DC

## 2021-10-04 MED ORDER — FLUOXETINE HCL 20 MG PO CAPS: 20.0000 mg | ORAL_CAPSULE | Freq: Every day | ORAL | 1 refills | Status: DC

## 2021-10-04 MED ORDER — COLCHICINE 0.6 MG PO TABS: ORAL_TABLET | ORAL | 1 refills | Status: DC

## 2021-10-04 MED ORDER — THIAMINE MONONITRATE 100 MG PO TABS
100.0000 mg | ORAL_TABLET | Freq: Every day | ORAL | 3 refills | Status: DC
Start: 2021-10-04 — End: 2023-11-13

## 2021-10-04 NOTE — Telephone Encounter (Signed)
Yes, due to his history of habitual alcohol use and the potential effects that this has had on his brain we need to supplement with thiamine and folate, even if his thiamine and folate levels today are normal. ?I just sent these prescriptions in a couple minutes ago so they may not have been ready at the pharmacy when they checked. ?

## 2021-10-04 NOTE — Telephone Encounter (Signed)
Pt's wife Thayer Headings had additional question concerning rx for thiamine or folate. It was mentioned during OV that a rx would be written for this even though pt was already having labs checked today. ? ?Please clarify.  ?

## 2021-10-04 NOTE — Progress Notes (Signed)
Office Note ?10/04/2021 ? ?CC:  ?Chief Complaint  ?Patient presents with  ? Annual Exam  ?  Pt is fasting  ? ?Patient is a 76 y.o. male who is here accompanied by wife Thayer Headings for annual health maintenance exam and 6 mo f/u HTN, HLD. ?He is followed by cardiology for CAD, chronic combined syst and diast HF and LBBB, and aortic insufficiency and aortic root dilatation. ?A/P as of last visit: ?"1) Memory loss/impairment.  Wife/patient request neurology referral so I ordered this today. ?  ?2) HTN: well controlled on 12.'5mg'$  aldactone qd. ?Also on entresto and coreg for HF. ?  ?3) HLD: tolerating atorva 40 qd. ?Goal LDL <70. ?FLP and hepatic panel today. ?  ?4) CAD, ischemic CM, LBBB: stable on BB, statin, entresto, aldactone. ?He has not been on ASA so I recommended he start '81mg'$  qd today." ? ?INTERIM HX: ?Saw neurologist for memory impairment, neuropsychiatric testing done, working diagnosis alcoholic dementia versus Alzheimer's. ?Has been off of Prozac for 2 years unknowingly.  Wife thought he was feeling this but when she took over administration of his medications relatively recently she noted it was not there.  Pharmacy said it had not been filled in a couple years. ?He had been on Prozac for anger control problems and it had been helpful at the 20 mg dose. ?He denies any depression or anxiety at this time. ?He still drinks some beer occasionally but per wife it is hard to tell how much because he goes down into his work shed to do it. ? ?Home blood pressures 120s over 70s consistently. ? ?Past Medical History:  ?Diagnosis Date  ? Amnestic MCI (mild cognitive impairment with memory loss) 08/09/2021  ? Aortic insufficiency 10/21/2018  ? Moderate AI by echo 08/2018. moderate 06/2021  ? Aortic regurgitation   ? Mild-mod on echo 07/2017.  Moderate 08/2018.  Moderate-severe on echo 04/2019--> TEE 11/15/19 EF 50-55%, no LV dilation or WMA. Mod AV sclerosis w/out stenosis, mod-to-sev AI-.>rpt echo 6 mo. 11/2020 moderate AI   ? Aortic root dilatation 10/2018  ? 11/17/19 45 mm aortic root at the sinuses of Valsalva->Dr. Radford Pax referred pt to CT surgeon. 52022 echo->40 mm, unchanged on echo 06/2022  ? Aortic stenosis, mild   ? Arthritis   ? L-Spine  ? Cardiac murmur   ? Systolic and diastolic as of 10/7423.  ? Cardiomyopathy 10/12/2010  ? Global hypokinesis, EF 40-45% by cath and echo (2012 echo and 2019 echo).  Feb 2020 EF 20-25%. - mixed ICM/NICM (alcohol and CAD)  October 2020 EF 25-30%. 11/2020 EF 40-45%. 06/2021 EF 45-50%,dil LV+hypokinesis,mod AI,grd I DD.  ? Chronic systolic (congestive) heart failure ([place]) 06/01/2019  ? max med mgmt, plus CRT-D placed 05/2019, EF improved to 50% after.  ? Coronary artery disease 11/16/2010  ? a. 2012 - 80% OM lesion. b. Cath 12/2018 - showing 95% prox RCA, 75% OM1, 50% prox-mid LAD. Unsuccessful PCI to 100% chronically occluded RCA summer 2020. CT angio chest w/aorta 08/2021->severe coronary calc's, no aneurism. Mild subclav stenosis d/t athero  ? Diverticulosis   ? Erectile dysfunction 08/16/2011  ? Essential hypertension 07/19/2010  ? GAD (generalized anxiety disorder)   ? managed by Dr. Toy Care with prozac '20mg'$  qd  ? Gout   ? Usually Right great toe; colchicine helps well  ? Gouty arthritis of toe of left foot 05/27/2013  ? Habitual alcohol use   ? Hx of colonoscopy 2004; 2014  ? Initial was normal; 2014-polypectomy and diverticulosis.  Recall 2019.  ?  Hyperlipidemia 10/23/2012  ? LBBB (left bundle branch block)   ? CRT-D placed 05/2019  ? Low back strain 10/24/2013  ? Mild neurocognitive disorder   ? Neurocognitive testing 2023.  Question early stage Alzheimer's  ? Obesity, Class I, BMI 30-34.9   ? Pulmonary nodule   ? 0.5 cm RLL subpleural nodule.  Pt NEVER SMOKER.  Low risk->no f/u imaging is needed.  ? Subclavian artery stenosis, left ([place])   ? mild, detected on CT angio chest w/aorta 08/2021  ? Thyroid nodule 04/23/2013  ? ? ?Past Surgical History:  ?Procedure Laterality Date  ? BIV ICD INSERTION  CRT-D N/A 06/01/2019  ? Procedure: BIV ICD INSERTION CRT-D;  Surgeon: [name], MD;  Location: [facility];  Service: Cardiovascular;  Laterality: N/A;  ? CARDIAC CATHETERIZATION  10/12/2010  ? diagnostic only: 1 vessel CAD with TIMI 3 flow, EF 40-45%.  Mild AS.  Medical mgmt of CAD.  ? COLONOSCOPY    ? COLONOSCOPY W/ POLYPECTOMY  10/06/2012  ? Diverticulosis and tubular adenoma w/out high grade dysplasia.  Repeat 5 yrs (Dr. Deatra Ina).  ? CORONARY ATHERECTOMY N/A 01/08/2019  ? 100% chronic RCA occlusion impossible to clear.  Med mgmt. Procedure: CORONARY ATHERECTOMY;  Surgeon: Sherren Mocha, MD;  Location: [facility];  Service: Cardiovascular;  Laterality: N/A;  ? POLYPECTOMY    ? RIGHT HEART CATH AND CORONARY ANGIOGRAPHY N/A 12/24/2018  ? 95% RCA occlusion, 75% obtuse marginal occlusion (3V dz). Procedure: RIGHT HEART CATH AND CORONARY ANGIOGRAPHY;  Surgeon: Lorretta Harp, MD;  Location: [facility];  Service: Cardiovascular;  Laterality: N/A;  ? TEE WITHOUT CARDIOVERSION N/A 11/15/2019  ? EF 50-55%, no LV dilation or WMA. Mod AV sclerosis w/out stenosis, mod-to-sev AI.  Procedure: TRANSESOPHAGEAL ECHOCARDIOGRAM (TEE);  Surgeon: Fay Records, MD;  Location[facility];  Service: Cardiovascular;  Laterality: N/A;  ? TRANSTHORACIC ECHOCARDIOGRAM  09/2010; 07/2017; 08/2018; 10/2019;05/2020  ? 2012:  EF 40-45%, global hypokinesis, mild AS.  07/2017: no chg except mild/mod AR.  08/2018 EF 20-25%, global LV hypok, DD, biatrial enlargemt, mod AR, aortic root 3.8 cm. 10/2019 EF 50%, mild dec RV fxn, mod-sev AR, aor root dil 42 mm. 05/2020 EF 45-50%,grd I DD, mod AI, aorta 58m, severe LAE. 10/2020 EF 45-50%, mild LVH, global hypokin, grd I DD, mod AI.  ? ? ?Family History  ?Problem Relation Age of Onset  ? Congenital heart disease Brother   ?     cardiomyopathy  ? Alzheimer's disease Mother   ? COPD Father   ? Heart failure Father   ?     congestive  ? Colon cancer Neg Hx   ? Esophageal cancer  Neg Hx   ? Stomach cancer Neg Hx   ? Rectal cancer Neg Hx   ? Colon polyps Neg Hx   ? ? ?Social History  ? ?Socioeconomic History  ? Marital status: Married  ?  Spouse name: [name] ? Number of children: 2  ? Years of education: 129 ? Highest education level: Bachelor's degree (e.g., BA, AB, BS)  ?Occupational History  ? Occupation: Retired  ?  Employer: [employer]  ?Tobacco Use  ? Smoking status: Former  ? Smokeless tobacco: Current  ?  Types: Chew  ? Tobacco comments:  ?  chews tobacco  ?Vaping Use  ? Vaping Use: Never used  ?Substance and Sexual Activity  ? Alcohol use: Yes  ?  Alcohol/week: 28.0 standard drinks  ?  Types:  28 Cans of beer per week  ?  Comment: daily  ? Drug use: No  ? Sexual activity: Yes  ?Other Topics Concern  ? Not on file  ?Social History Narrative  ? Retired Education officer, community for [employer].  Married, two children.  ? Pt denies ever smoking cigarettes.  ? +Alcohol.  ? Lives in [place].  ? Right handed  ? Drinks caffeine  ? Two story home  ? ?Social Determinants of Health  ? ?Financial Resource Strain: Low Risk   ? Difficulty of Paying Living Expenses: Not hard at all  ?Food Insecurity: No Food Insecurity  ? Worried About Charity fundraiser in the Last Year: Never true  ? Ran Out of Food in the Last Year: Never true  ?Transportation Needs: No Transportation Needs  ? Lack of Transportation (Medical): No  ? Lack of Transportation (Non-Medical): No  ?Physical Activity: Inactive  ? Days of Exercise per Week: 0 days  ? Minutes of Exercise per Session: 0 min  ?Stress: No Stress Concern Present  ? Feeling of Stress : Not at all  ?Social Connections: Unknown  ? Frequency of Communication with Friends and Family: Once a week  ? Frequency of Social Gatherings with Friends and Family: More than three times a week  ? Attends Religious Services: Not on file  ? Active Member of Clubs or Organizations: No  ? Attends Archivist Meetings: Never  ? Marital Status: Married  ?Intimate Partner  Violence: Not At Risk  ? Fear of Current or Ex-Partner: No  ? Emotionally Abused: No  ? Physically Abused: No  ? Sexually Abused: No  ? ? ?Outpatient Medications Prior to Visit  ?Medication Sig Dispense R

## 2021-10-04 NOTE — Telephone Encounter (Signed)
Sure, that is good. ?

## 2021-10-04 NOTE — Telephone Encounter (Signed)
Spoke with [facility] regarding rx update. They will get the rx changed and updated in their system. Pt was notified regarding rx ?

## 2021-10-04 NOTE — Telephone Encounter (Signed)
Please advise if okay for change. ?

## 2021-10-04 NOTE — Telephone Encounter (Signed)
Stew w/[facility] called. He is asking if Dr. Anitra Lauth would be ok to change to 5436GOV Folic Acid. It is much less expensive and in stock.  ? ?Call back # [phone] ? ?

## 2021-10-12 LAB — VITAMIN B1: Vitamin B1 (Thiamine): 8 nmol/L (ref 8–30)

## 2021-10-15 ENCOUNTER — Encounter: Payer: Self-pay | Admitting: Family Medicine

## 2021-10-15 ENCOUNTER — Encounter: Payer: Self-pay | Admitting: Cardiology

## 2021-10-15 ENCOUNTER — Ambulatory Visit (INDEPENDENT_AMBULATORY_CARE_PROVIDER_SITE_OTHER): Payer: Medicare PPO

## 2021-10-15 DIAGNOSIS — I5022 Chronic systolic (congestive) heart failure: Secondary | ICD-10-CM

## 2021-10-15 DIAGNOSIS — Z95810 Presence of automatic (implantable) cardiac defibrillator: Secondary | ICD-10-CM | POA: Diagnosis not present

## 2021-10-16 NOTE — Telephone Encounter (Signed)
Liver tests are on the complete metabolic panel done 1/32/4401. ?These were all normal.  Reassure. ?

## 2021-10-17 ENCOUNTER — Telehealth: Payer: Self-pay | Admitting: Physician Assistant

## 2021-10-17 NOTE — Progress Notes (Signed)
EPIC Encounter for ICM Monitoring ? ?Patient Name: Anthony Daugherty is a 76 y.o. male ?Date: 10/17/2021 ?Primary Care Physican: Tammi Sou, MD ?Primary Cardiologist: Radford Pax ?Electrophysiologist: Allred ?Bi-V Pacing: 99%         ?07/27/2021 Office Weight: 170 lbs ?  ?AT/AF Burden: <1% ?                                                         ?  ?Spoke with wife per DPR and patient is feeling very well.  No dehydration or fluid symptoms.    ?  ?CorVue thoracic impedance suggesting possible dryness starting 3/27.  Was suggesting possible fluid accumulation from 3/19-3/26.  ?  ?Prescribed: ?Spironolactone 25 mg take 0.5 tablet daily. ?  ?Labs: ?09/04/2021 Creatinine 0.96, BUN 26, Potassium 4.2, Sodium 140, GFR 82 ?04/03/2021 Creatinine 0.87, BUN 12, Potassium 3.9, Sodium 140, GFR 84.95 ?09/29/2020 Creatinine 1.02, BUN 16, Potassium 4.2, Sodium 142, GFR 72.62 ?A complete set of results can be found in Results Review. ?  ?Recommendations: Advised to drink 64 oz fluid daily to stay hydrated.   ?  ?Follow-up plan: ICM clinic phone appointment on 10/22/2021 (manual) to recheck fluid levels.   91 day device clinic remote transmission 11/29/2021. ?  ?EP/Cardiology Office Visits: 12/17/2021 with Dr Burt Knack.  01/07/2022 with Dr Rayann Heman.   ?  ?Copy of ICM check sent to Dr. Rayann Heman.   ? ?3 month ICM trend: 10/17/2021. ? ? ? ?12-14 Month ICM trend:  ? ? ? ?Rosalene Billings, RN ?10/17/2021 ?8:33 AM ? ?

## 2021-10-17 NOTE — Telephone Encounter (Signed)
Patient's wife called about Dr. Radford Pax clearing the patient to have an MRI with his defibulator. She said this has been done and they are ready to schedule but not sure where to call. ?

## 2021-10-18 NOTE — Telephone Encounter (Signed)
Patient is scheduled at [facility] for 11/12/2021 1:00pm. Can call [place] to see if any appts sooner. [phone]. Left message on patients voicemail to call office  ?

## 2021-10-18 NOTE — Telephone Encounter (Signed)
Patient's wife left a voice mail returning a call to [place]. ?

## 2021-10-18 NOTE — Telephone Encounter (Signed)
I advised to patient, she is going to reach out to [facility] to see if he can get an early appt.  ?

## 2021-10-22 ENCOUNTER — Ambulatory Visit (INDEPENDENT_AMBULATORY_CARE_PROVIDER_SITE_OTHER): Payer: Medicare PPO

## 2021-10-22 DIAGNOSIS — Z95810 Presence of automatic (implantable) cardiac defibrillator: Secondary | ICD-10-CM

## 2021-10-22 DIAGNOSIS — I5022 Chronic systolic (congestive) heart failure: Secondary | ICD-10-CM

## 2021-10-22 NOTE — Progress Notes (Signed)
EPIC Encounter for ICM Monitoring ? ?Patient Name: Anthony Daugherty is a 76 y.o. male ?Date: 10/22/2021 ?Primary Care Physican: Tammi Sou, MD ?Primary Cardiologist: Radford Pax ?Electrophysiologist: Allred ?Bi-V Pacing: 99%         ?07/27/2021 Office Weight: 170 lbs ?  ?AT/AF Burden: <1% ?                                                         ?  ?Spoke with wife per DPR and patient he is feeling very well.  No dehydration or fluid symptoms.    ?  ?CorVue thoracic impedance suggesting fluid levels returned to normal after adjusting fluid intake.  ?  ?Prescribed: ?Spironolactone 25 mg take 0.5 tablet daily. ?  ?Labs: ?09/04/2021 Creatinine 0.96, BUN 26, Potassium 4.2, Sodium 140, GFR 82 ?04/03/2021 Creatinine 0.87, BUN 12, Potassium 3.9, Sodium 140, GFR 84.95 ?09/29/2020 Creatinine 1.02, BUN 16, Potassium 4.2, Sodium 142, GFR 72.62 ?A complete set of results can be found in Results Review. ?  ?Recommendations: Advised to continue to drink approximately 64 oz fluid daily to stay hydrated.   ?  ?Follow-up plan: ICM clinic phone appointment on 11/19/2021.   91 day device clinic remote transmission 11/29/2021. ?  ?EP/Cardiology Office Visits: 12/17/2021 with Dr Burt Knack.  01/07/2022 with Dr Rayann Heman.   ?  ?Copy of ICM check sent to Dr. Rayann Heman.   ?  ?3 month ICM trend: 10/22/2021. ? ? ? ?12-14 Month ICM trend:  ? ? ? ?Rosalene Billings, RN ?10/22/2021 ?3:35 PM ? ?

## 2021-10-23 ENCOUNTER — Other Ambulatory Visit: Payer: Medicare PPO | Admitting: *Deleted

## 2021-10-23 DIAGNOSIS — I5022 Chronic systolic (congestive) heart failure: Secondary | ICD-10-CM | POA: Diagnosis not present

## 2021-10-23 LAB — BASIC METABOLIC PANEL
BUN/Creatinine Ratio: 11 (ref 10–24)
BUN: 11 mg/dL (ref 8–27)
CO2: 24 mmol/L (ref 20–29)
Calcium: 9.3 mg/dL (ref 8.6–10.2)
Chloride: 105 mmol/L (ref 96–106)
Creatinine, Ser: 0.98 mg/dL (ref 0.76–1.27)
Glucose: 130 mg/dL — ABNORMAL HIGH (ref 70–99)
Potassium: 3.1 mmol/L — ABNORMAL LOW (ref 3.5–5.2)
Sodium: 142 mmol/L (ref 134–144)
eGFR: 80 mL/min/{1.73_m2} (ref >59)

## 2021-10-26 ENCOUNTER — Telehealth: Payer: Self-pay

## 2021-10-26 DIAGNOSIS — I10 Essential (primary) hypertension: Secondary | ICD-10-CM

## 2021-10-26 DIAGNOSIS — Z95810 Presence of automatic (implantable) cardiac defibrillator: Secondary | ICD-10-CM

## 2021-10-26 DIAGNOSIS — I5022 Chronic systolic (congestive) heart failure: Secondary | ICD-10-CM

## 2021-10-26 MED ORDER — POTASSIUM CHLORIDE CRYS ER 20 MEQ PO TBCR: 20.0000 meq | EXTENDED_RELEASE_TABLET | Freq: Every day | ORAL | 0 refills | Status: DC

## 2021-10-26 NOTE — Telephone Encounter (Signed)
-----   Message from Sueanne Margarita, MD sent at 10/26/2021  3:41 PM EDT ----- ?Please have him take [place] 65mq twice today and then stop and repeat BMET on MOnday ?----- Message ----- ?From: OAntonieta Iba RN ?Sent: 10/26/2021  12:20 PM EDT ?To: TSueanne Margarita MD ? ?Patient is not taking HCTZ ? ?

## 2021-10-29 ENCOUNTER — Encounter: Payer: Self-pay | Admitting: Cardiovascular Disease

## 2021-10-29 NOTE — Addendum Note (Signed)
Addended by: Antonieta Iba on: 10/29/2021 12:49 PM ? ? Modules accepted: Orders ? ?

## 2021-10-30 ENCOUNTER — Other Ambulatory Visit: Payer: Medicare PPO

## 2021-10-30 DIAGNOSIS — I5022 Chronic systolic (congestive) heart failure: Secondary | ICD-10-CM

## 2021-10-30 DIAGNOSIS — Z95810 Presence of automatic (implantable) cardiac defibrillator: Secondary | ICD-10-CM

## 2021-10-30 DIAGNOSIS — I10 Essential (primary) hypertension: Secondary | ICD-10-CM | POA: Diagnosis not present

## 2021-10-31 LAB — BASIC METABOLIC PANEL
BUN/Creatinine Ratio: 12 (ref 10–24)
BUN: 10 mg/dL (ref 8–27)
CO2: 21 mmol/L (ref 20–29)
Calcium: 8.5 mg/dL — ABNORMAL LOW (ref 8.6–10.2)
Chloride: 106 mmol/L (ref 96–106)
Creatinine, Ser: 0.86 mg/dL (ref 0.76–1.27)
Glucose: 87 mg/dL (ref 70–99)
Potassium: 3.7 mmol/L (ref 3.5–5.2)
Sodium: 141 mmol/L (ref 134–144)
eGFR: 90 mL/min/{1.73_m2} (ref >59)

## 2021-11-08 ENCOUNTER — Telehealth: Payer: Self-pay | Admitting: Physician Assistant

## 2021-11-08 NOTE — Telephone Encounter (Signed)
Updated at [place] prescheduling for update approval number   ?

## 2021-11-08 NOTE — Telephone Encounter (Signed)
Kylie from pre service for the hospital called.  She stated that they needed prior authorization for the MRI that is scheduled for this patient.  Her number is [id number] extension [id number]. ?

## 2021-11-08 NOTE — Telephone Encounter (Signed)
I left message for patient [id number]. Approved [id number]. They need to resubmit this ? ?

## 2021-11-12 ENCOUNTER — Telehealth: Payer: Self-pay | Admitting: Neurology

## 2021-11-12 ENCOUNTER — Ambulatory Visit (HOSPITAL_COMMUNITY)
Admission: RE | Admit: 2021-11-12 | Discharge: 2021-11-12 | Disposition: A | Discharge status: Home / Self Care | Payer: Medicare PPO | Source: Ambulatory Visit | Attending: Physician Assistant | Admitting: Physician Assistant

## 2021-11-12 DIAGNOSIS — I639 Cerebral infarction, unspecified: Secondary | ICD-10-CM | POA: Diagnosis not present

## 2021-11-12 DIAGNOSIS — G319 Degenerative disease of nervous system, unspecified: Secondary | ICD-10-CM | POA: Diagnosis not present

## 2021-11-12 DIAGNOSIS — G3184 Mild cognitive impairment, so stated: Secondary | ICD-10-CM | POA: Diagnosis not present

## 2021-11-12 DIAGNOSIS — R413 Other amnesia: Secondary | ICD-10-CM | POA: Diagnosis not present

## 2021-11-12 DIAGNOSIS — J3489 Other specified disorders of nose and nasal sinuses: Secondary | ICD-10-CM | POA: Diagnosis not present

## 2021-11-12 NOTE — Telephone Encounter (Signed)
Received call from radiology re: MRI report.  Reviewed chart.  MRI ordered end of feb for w/u for memory change.  No stroke sx's at time.  Called mobile/contact number (wifes number) and no answer.  Called home number and pt picked up, identified by 2 patient identifiers.  Pt was alert and oriented x 3 on phone.  Pt denies any new or lateralizing sx's at all.  Questioned in detail.  Discussed MRI findings.  He doesn't want to go to ER given incidental findings.  We agreed that if he has any new/lateralizing sx's to get to the ER and that the office would call him in the AM with further instructions and an appointment probably this week.  Pt is already on ASA and statin med. ?

## 2021-11-12 NOTE — Progress Notes (Signed)
Per order, Changed device settings for MRI mode and DOO. 85 if rate needed  ? ?Tachy-therapies to off if applicable.  ? ?Will program device back to pre-MRI settings after completion of exam. ?

## 2021-11-12 NOTE — Progress Notes (Signed)
Informed of MRI for today.  ? ?Device system confirmed to be MRI conditional, with implant date > 6 weeks ago, and no evidence of abandoned or epicardial leads in review of most recent CXR ?Interrogation from today reviewed, pt is currently AP-VP at 64 bpm ?Change device settings for MRI mode and DOO. 85 if rate needed ? ?Tachy-therapies to off if applicable. ? ?Program device back to pre-MRI settings after completion of exam. ? ?Anthony Hicklin, PA-C  ?11/12/2021 12:44 PM   ?

## 2021-11-13 ENCOUNTER — Ambulatory Visit: Payer: Medicare PPO | Admitting: Physician Assistant

## 2021-11-13 NOTE — Telephone Encounter (Signed)
Received access nurse report dated 11/12/21 at 6:43 PM with MRI results. Full access nurse report in [facility] box ?

## 2021-11-13 NOTE — Telephone Encounter (Signed)
Front staff will contact patient for appt with Anthony Daugherty tomorrow 5/3 ?

## 2021-11-13 NOTE — Telephone Encounter (Signed)
Patient scheduled for tomorrow

## 2021-11-13 NOTE — Telephone Encounter (Signed)
Patient now on schedule for 5/3 at 11am. Thanks! ?

## 2021-11-14 ENCOUNTER — Ambulatory Visit: Payer: Medicare PPO | Admitting: Physician Assistant

## 2021-11-14 ENCOUNTER — Encounter: Payer: Self-pay | Admitting: Physician Assistant

## 2021-11-14 VITALS — BP 132/78 | HR 64 | Wt 160.0 lb

## 2021-11-14 DIAGNOSIS — G3184 Mild cognitive impairment, so stated: Secondary | ICD-10-CM

## 2021-11-14 DIAGNOSIS — R4189 Other symptoms and signs involving cognitive functions and awareness: Secondary | ICD-10-CM

## 2021-11-14 DIAGNOSIS — R9089 Other abnormal findings on diagnostic imaging of central nervous system: Secondary | ICD-10-CM | POA: Diagnosis not present

## 2021-11-14 DIAGNOSIS — I639 Cerebral infarction, unspecified: Secondary | ICD-10-CM

## 2021-11-14 DIAGNOSIS — I2510 Atherosclerotic heart disease of native coronary artery without angina pectoris: Secondary | ICD-10-CM

## 2021-11-14 DIAGNOSIS — I6782 Cerebral ischemia: Secondary | ICD-10-CM

## 2021-11-14 MED ORDER — CLOPIDOGREL BISULFATE 75 MG PO TABS: 75.0000 mg | ORAL_TABLET | Freq: Every day | ORAL | 11 refills | Status: DC

## 2021-11-14 NOTE — Patient Instructions (Addendum)
It was a pleasure to see you today at our office.  ? ?Recommendations: ? ?Continue Donepezil 10 mg  daily  ?COntinue Aspirin 81 mg daily ?Carotid Ultrasound  ?Echocardiogram  ?Discontinue alcohol ?Check labs by PCP ?Follow up Jun 1 at 11:30  ? ?RECOMMENDATIONS FOR ALL PATIENTS WITH MEMORY PROBLEMS: ?1. Continue to exercise (Recommend 30 minutes of walking everyday, or 3 hours every week) ?2. Increase social interactions - continue going to [place] and enjoy social gatherings with friends and family ?3. Eat healthy, avoid fried foods and eat more fruits and vegetables ?4. Maintain adequate blood pressure, blood sugar, and blood cholesterol level. Reducing the risk of stroke and cardiovascular disease also helps promoting better memory. ?5. Avoid stressful situations. Live a simple life and avoid aggravations. Organize your time and prepare for the next day in anticipation. ?6. Sleep well, avoid any interruptions of sleep and avoid any distractions in the bedroom that may interfere with adequate sleep quality ?7. Avoid sugar, avoid sweets as there is a strong link between excessive sugar intake, diabetes, and cognitive impairment ?We discussed the Mediterranean diet, which has been shown to help patients reduce the risk of progressive memory disorders and reduces cardiovascular risk. This includes eating fish, eat fruits and green leafy vegetables, nuts like almonds and hazelnuts, walnuts, and also use olive oil. Avoid fast foods and fried foods as much as possible. Avoid sweets and sugar as sugar use has been linked to worsening of memory function. ? ?There is always a concern of gradual progression of memory problems. If this is the case, then we may need to adjust level of care according to patient needs. Support, both to the patient and caregiver, should then be put into place.  ? ? ?FALL PRECAUTIONS: Be cautious when walking. Scan the area for obstacles that may increase the risk of trips and falls. When getting  up in the mornings, sit up at the edge of the bed for a few minutes before getting out of bed. Consider elevating the bed at the head end to avoid drop of blood pressure when getting up. Walk always in a well-lit room (use night lights in the walls). Avoid area rugs or power cords from appliances in the middle of the walkways. Use a walker or a cane if necessary and consider physical therapy for balance exercise. Get your eyesight checked regularly. ? ?FINANCIAL OVERSIGHT: Supervision, especially oversight when making financial decisions or transactions is also recommended. ? ?HOME SAFETY: Consider the safety of the kitchen when operating appliances like stoves, microwave oven, and blender. Consider having supervision and share cooking responsibilities until no longer able to participate in those. Accidents with firearms and other hazards in the house should be identified and addressed as well. ? ? ?ABILITY TO BE LEFT ALONE: If patient is unable to contact 911 operator, consider using LifeLine, or when the need is there, arrange for someone to stay with patients. Smoking is a fire hazard, consider supervision or cessation. Risk of wandering should be assessed by caregiver and if detected at any point, supervision and safe proof recommendations should be instituted. ? ?MEDICATION SUPERVISION: Inability to self-administer medication needs to be constantly addressed. Implement a mechanism to ensure safe administration of the medications. ? ? ?DRIVING: Regarding driving, in patients with progressive memory problems, driving will be impaired. We advise to have someone else do the driving if trouble finding directions or if minor accidents are reported. Independent driving assessment is available to determine safety of driving. ? ? ?  If you are interested in the driving assessment, you can contact the following: ? ?[employer] in [place] ? ?Page  [phone] ? ?[facility] [phone] ? ?[facility] [phone] or [phone] ? ? ? ?Mediterranean Diet ?A Mediterranean diet refers to food and lifestyle choices that are based on the traditions of countries located on the [employer]. This way of eating has been shown to help prevent certain conditions and improve outcomes for people who have chronic diseases, like kidney disease and heart disease. ?What are tips for following this plan? ?Lifestyle  ?Cook and eat meals together with your family, when possible. ?Drink enough fluid to keep your urine clear or pale yellow. ?Be physically active every day. This includes: ?Aerobic exercise like running or swimming. ?Leisure activities like gardening, walking, or housework. ?Get 7-8 hours of sleep each night. ?If recommended by your health care provider, drink red wine in moderation. This means 1 glass a day for nonpregnant women and 2 glasses a day for men. A glass of wine equals 5 oz (150 mL). ?Reading food labels  ?Check the serving size of packaged foods. For foods such as rice and pasta, the serving size refers to the amount of cooked product, not dry. ?Check the total fat in packaged foods. Avoid foods that have saturated fat or trans fats. ?Check the ingredients list for added sugars, such as corn syrup. ?Shopping  ?At the grocery store, buy most of your food from the areas near the walls of the store. This includes: ?Fresh fruits and vegetables (produce). ?Grains, beans, nuts, and seeds. Some of these may be available in unpackaged forms or large amounts (in bulk). ?Fresh seafood. ?Poultry and eggs. ?Low-fat dairy products. ?Buy whole ingredients instead of prepackaged foods. ?Buy fresh fruits and vegetables in-season from local farmers markets. ?Buy frozen fruits and vegetables in resealable bags. ?If you do not have access to quality fresh seafood, buy precooked frozen shrimp or canned fish, such as tuna, salmon, or sardines. ?Buy  small amounts of raw or cooked vegetables, salads, or olives from the deli or salad bar at your store. ?Stock your pantry so you always have certain foods on hand, such as olive oil, canned tuna, canned tomatoes, rice, pasta, and beans. ?Cooking  ?Cook foods with extra-virgin olive oil instead of using butter or other vegetable oils. ?Have meat as a side dish, and have vegetables or grains as your main dish. This means having meat in small portions or adding small amounts of meat to foods like pasta or stew. ?Use beans or vegetables instead of meat in common dishes like chili or lasagna. ?Experiment with different cooking methods. Try roasting or broiling vegetables instead of steaming or saut?eing them. ?Add frozen vegetables to soups, stews, pasta, or rice. ?Add nuts or seeds for added healthy fat at each meal. You can add these to yogurt, salads, or vegetable dishes. ?Marinate fish or vegetables using olive oil, lemon juice, garlic, and fresh herbs. ?Meal planning  ?Plan to eat 1 vegetarian meal one day each week. Try to work up to 2 vegetarian meals, if possible. ?Eat seafood 2 or more times a week. ?Have healthy snacks readily available, such as: ?Vegetable sticks with hummus. ?[place] yogurt. ?Fruit and nut trail mix. ?Eat balanced meals throughout the week. This includes: ?Fruit: 2-3 servings a day ?Vegetables: 4-5 servings a day ?Low-fat dairy: 2 servings a day ?Fish, poultry, or lean meat: 1 serving a day ?Beans and legumes: 2 or more  servings a week ?Nuts and seeds: 1-2 servings a day ?Whole grains: 6-8 servings a day ?Extra-virgin olive oil: 3-4 servings a day ?Limit red meat and sweets to only a few servings a month ?What are my food choices? ?Mediterranean diet ?Recommended ?Grains: Whole-grain pasta. Brown rice. Bulgar wheat. Polenta. Couscous. Whole-wheat bread. [place]. ?Vegetables: Artichokes. Beets. Broccoli. Cabbage. Carrots. Eggplant. Green beans. Chard. Kale. Spinach. Onions. Leeks. Peas.  Squash. Tomatoes. Peppers. Radishes. ?Fruits: Apples. Apricots. Avocado. Berries. Bananas. Cherries. Dates. Figs. Grapes. Lemons. Melon. Oranges. Peaches. Plums. Pomegranate. ?Meats and other protein foods: Beans. Almonds. Su

## 2021-11-14 NOTE — Progress Notes (Addendum)
?[facility] ?Neurology Division ?Clinic Note - Initial Visit ? ? ?Date: 11/14/21 ? ?Anthony Daugherty ?MRN: [id number] ?DOB: February 08, 1946 ? ? ?History of Present Illness: ? ?Anthony Daugherty is a 75 y.o. R-handed male with a history of heavy alcohol abuse, recently seen at our office for evaluation of amnestic MCI, chronic CHF, LBBB, dilated cardiomyopathy, aortic root dilatation, status post PMP 2020, history of alcohol abuse, depression, hypertension, hyperlipidemia, presenting today for evaluation after imaging work-up for memory and yielded abnormal results.  In review, our office was called on 11/12/2021, after MRI of the brain without contrast showed 6 mm focus of restricted diffusion within the right caudate nucleus compatible with an acute/early subacute lacunar infarct, along with other chronic small vessel ischemic disease, subcentimeter chronic infarct within the inferior right cerebellar hemisphere, and mild generalized cerebral atrophy. The mobile-contact number (wife) was called without answer.  Then, the home number was called, the patient picked up the phone, and, he was informed of the abnormalities.he was alert and oriented at the time, he denies any new or lateralizing symptoms at all.  He was questioned in detail.  He did not want to go to the emergency department.  Any incidental findings.  It was agreed that if there were any new symptoms, he would go to the emergency department.  The office arrange a visit for today.  He reported that he was on aspirin and statin.  Upon arrival, the wife reported "I did not know about this at all, I thought I was coming to talk about memory ".  The patient acknowledged that he did not tell his wife about the situation.  She was visibly upset about it.  Of note, the patient is not on aspirin, and has not been compliant with his medications.  His wife agrees that he has not on any aspirin. It is important to report that the patient has no insight into  the seriousness of the situation, and has been laughing throughout the whole visit.  He states "what we have to die about something anyway ". ? Patient  never had a similar episode.Denies any history of TIA. Denies vertigo dizziness or vision changes. Denies headaches, dysarthria or dysphagia.  Denies seizures. Denies any chest pain, or shortness of breath. Denies any fever or chills, or night sweats. No tobacco. No new meds or hormonal supplements. Denies any recent long distance trips or recent surgeries. No sick contacts.  He is not very active.  He continues to heavily drink alcohol. ? ?Out-side paper records, electronic medical record, and images have been reviewed where available and summarized as:  ?No results found for: HGBA1C ?Lab Results  ?Component Value Date  ? IWOEHOZY24 315 10/04/2021  ? ?Lab Results  ?Component Value Date  ? TSH 3.45 04/12/2021  ? ?No results found for: ESRSEDRATE, POCTSEDRATE ? ?Lab Results  ?Component Value Date  ? CHOL 127 10/04/2021  ? HDL 61.30 10/04/2021  ? Burke 57 10/04/2021  ? TRIG 42.0 10/04/2021  ? CHOLHDL 2 10/04/2021  ? ? ? ?Past Medical History:  ?Diagnosis Date  ? Amnestic MCI (mild cognitive impairment with memory loss) 08/09/2021  ? Aortic insufficiency 10/21/2018  ? Moderate AI by echo 08/2018. moderate 06/2021  ? Aortic regurgitation   ? Mild-mod on echo 07/2017.  Moderate 08/2018.  Moderate-severe on echo 04/2019--> TEE 11/15/19 EF 50-55%, no LV dilation or WMA. Mod AV sclerosis w/out stenosis, mod-to-sev AI-.>rpt echo 6 mo. 11/2020 moderate AI  ? Aortic root dilatation 10/2018  ?  11/17/19 45 mm aortic root at the sinuses of Valsalva->Dr. Radford Pax referred pt to CT surgeon. 52022 echo->40 mm, unchanged on echo 06/2022  ? Aortic stenosis, mild   ? Arthritis   ? L-Spine  ? Cardiac murmur   ? Systolic and diastolic as of 01/6159.  ? Cardiomyopathy 10/12/2010  ? Global hypokinesis, EF 40-45% by cath and echo (2012 echo and 2019 echo).  Feb 2020 EF 20-25%. - mixed ICM/NICM  (alcohol and CAD)  October 2020 EF 25-30%. 11/2020 EF 40-45%. 06/2021 EF 45-50%,dil LV+hypokinesis,mod AI,grd I DD.  ? Chronic systolic (congestive) heart failure ([place]) 06/01/2019  ? max med mgmt, plus CRT-D placed 05/2019, EF improved to 50% after.  ? Coronary artery disease 11/16/2010  ? a. 2012 - 80% OM lesion. b. Cath 12/2018 - showing 95% prox RCA, 75% OM1, 50% prox-mid LAD. Unsuccessful PCI to 100% chronically occluded RCA summer 2020. CT angio chest w/aorta 08/2021->severe coronary calc's, no aneurism. Mild subclav stenosis d/t athero  ? Diverticulosis   ? Erectile dysfunction 08/16/2011  ? Essential hypertension 07/19/2010  ? GAD (generalized anxiety disorder)   ? managed by Dr. Toy Care with prozac '20mg'$  qd  ? Gout   ? Usually Right great toe; colchicine helps well  ? Gouty arthritis of toe of left foot 05/27/2013  ? Habitual alcohol use   ? Hx of colonoscopy 2004; 2014  ? Initial was normal; 2014-polypectomy and diverticulosis.  Recall 2019.  ? Hyperlipidemia 10/23/2012  ? LBBB (left bundle branch block)   ? CRT-D placed 05/2019  ? Low back strain 10/24/2013  ? Mild neurocognitive disorder   ? Neurocognitive testing 2023.  Question early stage Alzheimer's  ? Obesity, Class I, BMI 30-34.9   ? Pulmonary nodule   ? 0.5 cm RLL subpleural nodule.  Pt NEVER SMOKER.  Low risk->no f/u imaging is needed.  ? Subclavian artery stenosis, left ([place])   ? mild, detected on CT angio chest w/aorta 08/2021  ? Thyroid nodule 04/23/2013  ? ? ?Past Surgical History:  ?Procedure Laterality Date  ? BIV ICD INSERTION CRT-D N/A 06/01/2019  ? Procedure: BIV ICD INSERTION CRT-D;  Surgeon: [name], MD;  Location: [place] [facility];  Service: Cardiovascular;  Laterality: N/A;  ? CARDIAC CATHETERIZATION  10/12/2010  ? diagnostic only: 1 vessel CAD with TIMI 3 flow, EF 40-45%.  Mild AS.  Medical mgmt of CAD.  ? COLONOSCOPY    ? COLONOSCOPY W/ POLYPECTOMY  10/06/2012  ? Diverticulosis and tubular adenoma w/out high grade dysplasia.  Repeat  5 yrs (Dr. Deatra Ina).  ? CORONARY ATHERECTOMY N/A 01/08/2019  ? 100% chronic RCA occlusion impossible to clear.  Med mgmt. Procedure: CORONARY ATHERECTOMY;  Surgeon: Sherren Mocha, MD;  Location: [place] [facility];  Service: Cardiovascular;  Laterality: N/A;  ? POLYPECTOMY    ? RIGHT HEART CATH AND CORONARY ANGIOGRAPHY N/A 12/24/2018  ? 95% RCA occlusion, 75% obtuse marginal occlusion (3V dz). Procedure: RIGHT HEART CATH AND CORONARY ANGIOGRAPHY;  Surgeon: Lorretta Harp, MD;  Location: [place] [facility];  Service: Cardiovascular;  Laterality: N/A;  ? TEE WITHOUT CARDIOVERSION N/A 11/15/2019  ? EF 50-55%, no LV dilation or WMA. Mod AV sclerosis w/out stenosis, mod-to-sev AI.  Procedure: TRANSESOPHAGEAL ECHOCARDIOGRAM (TEE);  Surgeon: Fay Records, MD;  Location: [facility];  Service: Cardiovascular;  Laterality: N/A;  ? TRANSTHORACIC ECHOCARDIOGRAM  09/2010; 07/2017; 08/2018; 10/2019;05/2020  ? 2012:  EF 40-45%, global hypokinesis, mild AS.  07/2017: no chg except mild/mod AR.  08/2018 EF 20-25%,  global LV hypok, DD, biatrial enlargemt, mod AR, aortic root 3.8 cm. 10/2019 EF 50%, mild dec RV fxn, mod-sev AR, aor root dil 42 mm. 05/2020 EF 45-50%,grd I DD, mod AI, aorta 74m, severe LAE. 10/2020 EF 45-50%, mild LVH, global hypokin, grd I DD, mod AI.  ? ? ? ?Medications:  ?Outpatient Encounter Medications as of 11/14/2021  ?Medication Sig  ? aspirin EC 81 MG tablet Take 81 mg by mouth daily. Swallow whole.  ? atorvastatin (LIPITOR) 40 MG tablet TAKE 1 TABLET DAILY  ? colchicine 0.6 MG tablet TAKE TWO TABLETS AT ONSET OF GOUT PAIN, REPEAT IN 1 HOUR, THEN 1 TABLET TWICE A DAY UNTIL RESOLVED  ? donepezil (ARICEPT) 10 MG tablet Take 1 tablet 10 mg daily  ? empagliflozin (JARDIANCE) 10 MG TABS tablet Take 1 tablet (10 mg total) by mouth daily before breakfast.  ? FLUoxetine (PROZAC) 20 MG capsule Take 1 capsule (20 mg total) by mouth daily.  ? Folic Acid 0.8 MG CAPS 1 cap po daily  ? metoprolol succinate (TOPROL XL) 100 MG  24 hr tablet Take 1 tablet (100 mg total) by mouth daily. Take with or immediately following a meal.  ? potassium chloride SA (KLOR-CON M20) 20 MEQ tablet Take 1 tablet (20 mEq total) by mouth daily.  ? sac

## 2021-11-15 DIAGNOSIS — I6381 Other cerebral infarction due to occlusion or stenosis of small artery: Secondary | ICD-10-CM | POA: Insufficient documentation

## 2021-11-15 DIAGNOSIS — R9089 Other abnormal findings on diagnostic imaging of central nervous system: Secondary | ICD-10-CM | POA: Insufficient documentation

## 2021-11-15 HISTORY — DX: Other cerebral infarction due to occlusion or stenosis of small artery: I63.81

## 2021-11-21 NOTE — Progress Notes (Signed)
No ICM remote transmission received for 11/19/2021 and next ICM transmission scheduled for 11/26/2021.   ?

## 2021-11-26 ENCOUNTER — Ambulatory Visit (INDEPENDENT_AMBULATORY_CARE_PROVIDER_SITE_OTHER): Payer: Medicare PPO

## 2021-11-26 DIAGNOSIS — I5022 Chronic systolic (congestive) heart failure: Secondary | ICD-10-CM | POA: Diagnosis not present

## 2021-11-26 DIAGNOSIS — Z95810 Presence of automatic (implantable) cardiac defibrillator: Secondary | ICD-10-CM | POA: Diagnosis not present

## 2021-11-29 ENCOUNTER — Ambulatory Visit (INDEPENDENT_AMBULATORY_CARE_PROVIDER_SITE_OTHER): Payer: Medicare PPO

## 2021-11-29 DIAGNOSIS — I255 Ischemic cardiomyopathy: Secondary | ICD-10-CM | POA: Diagnosis not present

## 2021-11-30 ENCOUNTER — Telehealth: Payer: Self-pay

## 2021-11-30 NOTE — Telephone Encounter (Signed)
Remote ICM transmission received.  Attempted call to patient regarding ICM remote transmission and left detailed message per [facility] return call.  Advised to return call for any fluid symptoms or questions. Next ICM remote transmission scheduled 12/31/2021.

## 2021-11-30 NOTE — Progress Notes (Signed)
EPIC Encounter for ICM Monitoring  Patient Name: Anthony Daugherty is a 76 y.o. male Date: 11/30/2021 Primary Care Physican: Tammi Sou, MD Primary Cardiologist: Radford Pax Electrophysiologist: Allred Bi-V Pacing: 99%         11/14/2021 Office Weight: 160 lbs   AT/AF Burden: 0%                                                            Attempted call to wife and unable to reach.  Left detailed message per DPR regarding transmission. Transmission reviewed.    CorVue thoracic impedance suggesting fluid levels returned to normal after adjusting fluid intake.    Prescribed: Spironolactone 25 mg take 0.5 tablet daily.   Labs: 10/30/2021 Creatinine 0.86, BUN 10, Potassium 3.7, Sodium 141, GFR 90 10/23/2021 Creatinine 0.98, BUN 11, Potassium 3.1, Sodium 142, GFR 80  10/04/2021 Creatinine 0.87, BUN 14, Potassium 3.7, Sodium 140  09/04/2021 Creatinine 0.96, BUN 26, Potassium 4.2, Sodium 140, GFR 82 A complete set of results can be found in Results Review.   Recommendations:  Left voice mail with ICM number and encouraged to call if experiencing any fluid symptoms.   Follow-up plan: ICM clinic phone appointment on 12/31/2021.   91 day device clinic remote transmission 03/01/2022.   EP/Cardiology Office Visits: 12/17/2021 with Dr Burt Knack.  01/07/2022 with Dr Rayann Heman.     Copy of ICM check sent to Dr. Rayann Heman.    3 month ICM trend: 11/30/2021.    12-14 Month ICM trend:     Rosalene Billings, RN 11/30/2021 12:40 PM

## 2021-12-03 LAB — CUP PACEART REMOTE DEVICE CHECK
Battery Remaining Longevity: 65 mo
Battery Remaining Percentage: 70 %
Battery Voltage: 2.96 V
Brady Statistic AP VP Percent: 86 %
Brady Statistic AP VS Percent: 1 %
Brady Statistic AS VP Percent: 13 %
Brady Statistic AS VS Percent: 1 %
Brady Statistic RA Percent Paced: 85 %
Date Time Interrogation Session: 20230519100053
HighPow Impedance: 57 Ohm
Implantable Lead Implant Date: 20201117
Implantable Lead Implant Date: 20201117
Implantable Lead Implant Date: 20201117
Implantable Lead Location: 753858
Implantable Lead Location: 753859
Implantable Lead Location: 753860
Implantable Pulse Generator Implant Date: 20201117
Lead Channel Impedance Value: 380 Ohm
Lead Channel Impedance Value: 400 Ohm
Lead Channel Impedance Value: 690 Ohm
Lead Channel Pacing Threshold Amplitude: 0.25 V
Lead Channel Pacing Threshold Amplitude: 0.625 V
Lead Channel Pacing Threshold Amplitude: 1.125 V
Lead Channel Pacing Threshold Pulse Width: 0.5 ms
Lead Channel Pacing Threshold Pulse Width: 0.5 ms
Lead Channel Pacing Threshold Pulse Width: 0.5 ms
Lead Channel Sensing Intrinsic Amplitude: 11.8 mV
Lead Channel Sensing Intrinsic Amplitude: 2.1 mV
Lead Channel Setting Pacing Amplitude: 1.625
Lead Channel Setting Pacing Amplitude: 1.625
Lead Channel Setting Pacing Amplitude: 2 V
Lead Channel Setting Pacing Pulse Width: 0.5 ms
Lead Channel Setting Pacing Pulse Width: 0.5 ms
Lead Channel Setting Sensing Sensitivity: 0.5 mV
Pulse Gen Serial Number: 111012743

## 2021-12-04 ENCOUNTER — Ambulatory Visit
Admission: RE | Admit: 2021-12-04 | Discharge: 2021-12-04 | Disposition: A | Discharge status: Home / Self Care | Payer: Medicare PPO | Source: Ambulatory Visit | Attending: Physician Assistant | Admitting: Physician Assistant

## 2021-12-04 DIAGNOSIS — I2510 Atherosclerotic heart disease of native coronary artery without angina pectoris: Secondary | ICD-10-CM

## 2021-12-04 DIAGNOSIS — I771 Stricture of artery: Secondary | ICD-10-CM | POA: Diagnosis not present

## 2021-12-04 DIAGNOSIS — G3184 Mild cognitive impairment, so stated: Secondary | ICD-10-CM

## 2021-12-04 DIAGNOSIS — I6523 Occlusion and stenosis of bilateral carotid arteries: Secondary | ICD-10-CM | POA: Diagnosis not present

## 2021-12-04 DIAGNOSIS — R4189 Other symptoms and signs involving cognitive functions and awareness: Secondary | ICD-10-CM

## 2021-12-07 ENCOUNTER — Other Ambulatory Visit: Payer: Self-pay

## 2021-12-07 ENCOUNTER — Telehealth: Payer: Self-pay | Admitting: Physician Assistant

## 2021-12-07 DIAGNOSIS — I2510 Atherosclerotic heart disease of native coronary artery without angina pectoris: Secondary | ICD-10-CM

## 2021-12-07 NOTE — Progress Notes (Signed)
Please let patient know he needs referral to vascular surgery for evaluation of the narrowing in the carotid arteries, continue aspirin and statin (cholesterol medicine), thanks

## 2021-12-07 NOTE — Telephone Encounter (Signed)
Pt's wife called in and left a message with the access nurse. She is returning a call about results

## 2021-12-07 NOTE — Telephone Encounter (Signed)
I advised to patient and they are going to speak with cardiologist before they set up the vascular surgeon appt. Will discuss on follow with Sara June 01,2023

## 2021-12-11 NOTE — Progress Notes (Signed)
Remote ICD transmission.   

## 2021-12-13 ENCOUNTER — Encounter: Payer: Self-pay | Admitting: Physician Assistant

## 2021-12-13 ENCOUNTER — Ambulatory Visit (INDEPENDENT_AMBULATORY_CARE_PROVIDER_SITE_OTHER): Payer: Medicare PPO | Admitting: Physician Assistant

## 2021-12-13 VITALS — BP 129/54 | HR 74 | Resp 18 | Ht 67.0 in | Wt 164.0 lb

## 2021-12-13 DIAGNOSIS — R9089 Other abnormal findings on diagnostic imaging of central nervous system: Secondary | ICD-10-CM

## 2021-12-13 DIAGNOSIS — G3184 Mild cognitive impairment, so stated: Secondary | ICD-10-CM
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
## 2021-12-13 NOTE — Patient Instructions (Signed)
It was a pleasure to see you today at our office.   Recommendations:  Follow up in 6  months Continue taking donepezil 10 mg daily  Continue taking ASA baby and statin Follow up with Cards and vascular surgery   Whom to call:  Memory  decline, memory medications: Call our office [phone]   For psychiatric meds, mood meds: Please have your primary care physician manage these medications.   Counseling regarding caregiver distress, including caregiver depression, anxiety and issues regarding community resources, adult day care programs, adult living facilities, or memory care questions:   Feel free to contact [place], Social Worker at [phone]   For assessment of decision of mental capacity and competency:  Call Dr. Anthoney Harada, geriatric psychiatrist at [phone]  For guidance in geriatric dementia issues please call [facility] [phone]  For guidance regarding [facility] and if placement were needed at the facility, contact Arnell Asal, Social Worker tel: [phone]  If you have any severe symptoms of a stroke, or other severe issues such as confusion,severe chills or fever, etc call 911 or go to the ER as you may need to be evaluated further   Feel free to visit Facebook page " [email or web address]" for tips of how to care for people with memory problems.         RECOMMENDATIONS FOR ALL PATIENTS WITH MEMORY PROBLEMS: 1. Continue to exercise (Recommend 30 minutes of walking everyday, or 3 hours every week) 2. Increase social interactions - continue going to [place] and enjoy social gatherings with friends and family 3. Eat healthy, avoid fried foods and eat more fruits and vegetables 4. Maintain adequate blood pressure, blood sugar, and blood cholesterol level. Reducing the risk of stroke and cardiovascular disease also helps promoting better memory. 5. Avoid stressful situations. Live a simple life and avoid aggravations.  Organize your time and prepare for the next day in anticipation. 6. Sleep well, avoid any interruptions of sleep and avoid any distractions in the bedroom that may interfere with adequate sleep quality 7. Avoid sugar, avoid sweets as there is a strong link between excessive sugar intake, diabetes, and cognitive impairment We discussed the Mediterranean diet, which has been shown to help patients reduce the risk of progressive memory disorders and reduces cardiovascular risk. This includes eating fish, eat fruits and green leafy vegetables, nuts like almonds and hazelnuts, walnuts, and also use olive oil. Avoid fast foods and fried foods as much as possible. Avoid sweets and sugar as sugar use has been linked to worsening of memory function.  There is always a concern of gradual progression of memory problems. If this is the case, then we may need to adjust level of care according to patient needs. Support, both to the patient and caregiver, should then be put into place.    FALL PRECAUTIONS: Be cautious when walking. Scan the area for obstacles that may increase the risk of trips and falls. When getting up in the mornings, sit up at the edge of the bed for a few minutes before getting out of bed. Consider elevating the bed at the head end to avoid drop of blood pressure when getting up. Walk always in a well-lit room (use night lights in the walls). Avoid area rugs or power cords from appliances in the middle of the walkways. Use a walker or a cane if necessary and consider physical therapy for balance exercise. Get your eyesight checked regularly.  FINANCIAL OVERSIGHT: Supervision, especially oversight when  making financial decisions or transactions is also recommended.  HOME SAFETY: Consider the safety of the kitchen when operating appliances like stoves, microwave oven, and blender. Consider having supervision and share cooking responsibilities until no longer able to participate in those. Accidents  with firearms and other hazards in the house should be identified and addressed as well.   ABILITY TO BE LEFT ALONE: If patient is unable to contact 911 operator, consider using LifeLine, or when the need is there, arrange for someone to stay with patients. Smoking is a fire hazard, consider supervision or cessation. Risk of wandering should be assessed by caregiver and if detected at any point, supervision and safe proof recommendations should be instituted.  MEDICATION SUPERVISION: Inability to self-administer medication needs to be constantly addressed. Implement a mechanism to ensure safe administration of the medications.   DRIVING: Regarding driving, in patients with progressive memory problems, driving will be impaired. We advise to have someone else do the driving if trouble finding directions or if minor accidents are reported. Independent driving assessment is available to determine safety of driving.   If you are interested in the driving assessment, you can contact the following:  [employer] in [place]  [place] [phone]  [place]  [facility] [phone] or [phone]

## 2021-12-14 ENCOUNTER — Other Ambulatory Visit (HOSPITAL_COMMUNITY): Payer: Medicare PPO

## 2021-12-17 ENCOUNTER — Ambulatory Visit: Payer: Medicare PPO | Admitting: Cardiovascular Disease

## 2021-12-17 ENCOUNTER — Ambulatory Visit (HOSPITAL_COMMUNITY): Payer: Medicare PPO

## 2021-12-31 ENCOUNTER — Ambulatory Visit (INDEPENDENT_AMBULATORY_CARE_PROVIDER_SITE_OTHER): Payer: Medicare PPO

## 2021-12-31 DIAGNOSIS — Z95810 Presence of automatic (implantable) cardiac defibrillator: Secondary | ICD-10-CM

## 2021-12-31 DIAGNOSIS — I5022 Chronic systolic (congestive) heart failure: Secondary | ICD-10-CM

## 2022-01-01 ENCOUNTER — Telehealth: Payer: Self-pay

## 2022-01-01 NOTE — Progress Notes (Signed)
EPIC Encounter for ICM Monitoring  Patient Name: Anthony Daugherty is a 76 y.o. male Date: 01/01/2022 Primary Care Physican: Tammi Sou, MD Primary Cardiologist: Radford Pax Electrophysiologist: Allred Bi-V Pacing: 99%         11/14/2021 Office Weight: 160 lbs   AT/AF Burden: 0%                                                            Attempted call to wife and unable to reach.  Left detailed message per DPR regarding transmission. Transmission reviewed.    CorVue thoracic impedance suggesting possible fluid accumulation starting 6/15.   Prescribed: Spironolactone 25 mg take 0.5 tablet daily.   Labs: 10/30/2021 Creatinine 0.86, BUN 10, Potassium 3.7, Sodium 141, GFR 90 10/23/2021 Creatinine 0.98, BUN 11, Potassium 3.1, Sodium 142, GFR 80  10/04/2021 Creatinine 0.87, BUN 14, Potassium 3.7, Sodium 140  09/04/2021 Creatinine 0.96, BUN 26, Potassium 4.2, Sodium 140, GFR 82 A complete set of results can be found in Results Review.   Recommendations:  Left voice mail with ICM number and encouraged to call if experiencing any fluid symptoms.  Message sent to Dr Rayann Heman for 6/26 OV to advise patient if report shows possible fluid accumulation.   Follow-up plan: ICM clinic phone appointment on 01/07/2022 to recheck fluid levels.   91 day device clinic remote transmission 03/01/2022.   EP/Cardiology Office Visits:    01/07/2022 with Dr Rayann Heman.     Copy of ICM check sent to Dr. Rayann Heman.    3 month ICM trend: 01/01/2022.    12-14 Month ICM trend:     Rosalene Billings, RN 01/01/2022 1:25 PM

## 2022-01-01 NOTE — Telephone Encounter (Signed)
Remote ICM transmission received.  Attempted call to wife per DPR regarding ICM remote transmission and left detailed message per DPR.  Advised to return call for any fluid symptoms or questions. Next ICM remote transmission scheduled 01/07/2022.

## 2022-01-07 ENCOUNTER — Ambulatory Visit (INDEPENDENT_AMBULATORY_CARE_PROVIDER_SITE_OTHER): Payer: Medicare PPO

## 2022-01-07 ENCOUNTER — Encounter: Payer: Medicare PPO | Admitting: Internal Medicine

## 2022-01-07 ENCOUNTER — Ambulatory Visit (INDEPENDENT_AMBULATORY_CARE_PROVIDER_SITE_OTHER): Payer: Medicare PPO | Admitting: Internal Medicine

## 2022-01-07 ENCOUNTER — Encounter (HOSPITAL_BASED_OUTPATIENT_CLINIC_OR_DEPARTMENT_OTHER): Payer: Self-pay | Admitting: Internal Medicine

## 2022-01-07 VITALS — BP 90/60 | HR 70 | Ht 66.5 in | Wt 162.0 lb

## 2022-01-07 DIAGNOSIS — I5022 Chronic systolic (congestive) heart failure: Secondary | ICD-10-CM | POA: Diagnosis not present

## 2022-01-07 DIAGNOSIS — I2510 Atherosclerotic heart disease of native coronary artery without angina pectoris: Secondary | ICD-10-CM | POA: Diagnosis not present

## 2022-01-07 DIAGNOSIS — E7800 Pure hypercholesterolemia, unspecified: Secondary | ICD-10-CM | POA: Diagnosis not present

## 2022-01-07 DIAGNOSIS — I255 Ischemic cardiomyopathy: Secondary | ICD-10-CM

## 2022-01-07 DIAGNOSIS — Z95810 Presence of automatic (implantable) cardiac defibrillator: Secondary | ICD-10-CM

## 2022-01-07 DIAGNOSIS — I10 Essential (primary) hypertension: Secondary | ICD-10-CM

## 2022-01-08 ENCOUNTER — Ambulatory Visit (HOSPITAL_COMMUNITY): Payer: Medicare PPO | Attending: Internal Medicine

## 2022-01-08 DIAGNOSIS — I255 Ischemic cardiomyopathy: Secondary | ICD-10-CM

## 2022-01-08 DIAGNOSIS — I5042 Chronic combined systolic (congestive) and diastolic (congestive) heart failure: Secondary | ICD-10-CM | POA: Diagnosis not present

## 2022-01-08 DIAGNOSIS — I10 Essential (primary) hypertension: Secondary | ICD-10-CM

## 2022-01-08 DIAGNOSIS — I77810 Thoracic aortic ectasia: Secondary | ICD-10-CM

## 2022-01-08 DIAGNOSIS — I2510 Atherosclerotic heart disease of native coronary artery without angina pectoris: Secondary | ICD-10-CM

## 2022-01-08 DIAGNOSIS — I359 Nonrheumatic aortic valve disorder, unspecified: Secondary | ICD-10-CM | POA: Diagnosis not present

## 2022-01-08 DIAGNOSIS — E7800 Pure hypercholesterolemia, unspecified: Secondary | ICD-10-CM

## 2022-01-09 ENCOUNTER — Encounter: Payer: Self-pay | Admitting: Cardiology

## 2022-01-09 LAB — ECHOCARDIOGRAM COMPLETE
AR max vel: 1.28 cm2
AV Area VTI: 1.42 cm2
AV Area mean vel: 1.24 cm2
AV Mean grad: 10.7 mmHg
AV Peak grad: 21.7 mmHg
Ao pk vel: 2.33 m/s
Area-P 1/2: 1.82 cm2
P 1/2 time: 512 msec
S' Lateral: 4.2 cm

## 2022-01-10 ENCOUNTER — Telehealth: Payer: Self-pay

## 2022-01-10 ENCOUNTER — Encounter: Payer: Self-pay | Admitting: Family Medicine

## 2022-01-10 DIAGNOSIS — I5022 Chronic systolic (congestive) heart failure: Secondary | ICD-10-CM

## 2022-01-10 DIAGNOSIS — I351 Nonrheumatic aortic (valve) insufficiency: Secondary | ICD-10-CM

## 2022-01-10 DIAGNOSIS — I255 Ischemic cardiomyopathy: Secondary | ICD-10-CM

## 2022-01-10 NOTE — Telephone Encounter (Signed)
The patient has been notified of the result and verbalized understanding.  All questions (if any) were answered. Antonieta Iba, RN 01/10/2022 11:53 AM  MRI has been ordered

## 2022-01-10 NOTE — Telephone Encounter (Signed)
-----   Message from Sueanne Margarita, MD sent at 01/09/2022  6:08 PM EDT ----- Echo showed mildly reduced LVF with EF 45-50$ with mildly enlarged LV with mildly thickened heart muscle and increased stiffness of heart muscle, mild LA enlargement, mildly leaky MV, severely leaky AV and mild AS.  Please order a cardiac MRI with gad to assess AI further>>Need to as EP device clinic if his AICD is MRI compatable

## 2022-01-14 ENCOUNTER — Encounter: Payer: Medicare PPO | Admitting: Psychology

## 2022-01-21 ENCOUNTER — Encounter: Payer: Medicare PPO | Admitting: Psychology

## 2022-02-10 IMAGING — CT CT ANGIO CHEST
3 of 8 series · 18 of 46 positions shown · IV contrast (OMNIPAQUE 350)
Comparison: 10/18/2019

CLINICAL DATA: 75-year-old male, follow-up aortic root dilation.

EXAM:
CT ANGIOGRAPHY CHEST WITH CONTRAST
TECHNIQUE: Multidetector CT imaging of the chest was performed using the
standard protocol during bolus administration of intravenous
contrast. Multiplanar CT image reconstructions and MIPs were
obtained to evaluate the vascular anatomy.

[Series 4: aorta 3.0 bf37 2 · axial · 0.81mm/px · z∈[-334,-46]mm · 13 of 112 slices shown]
[im 8/112  lung]
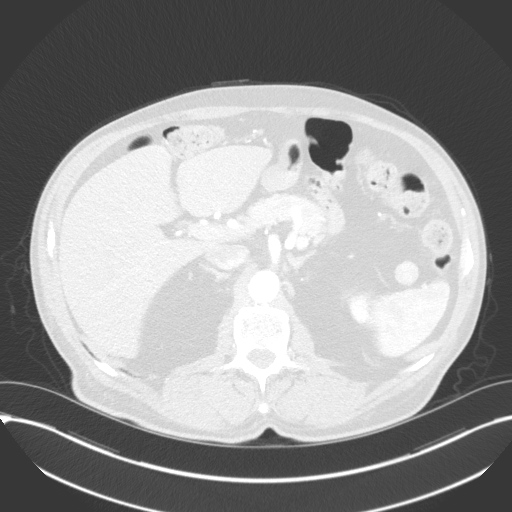
[im 16/112  soft-tissue]
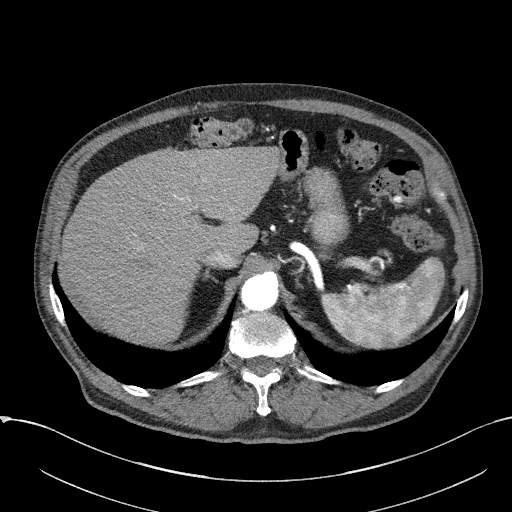
[im 24/112  lung]
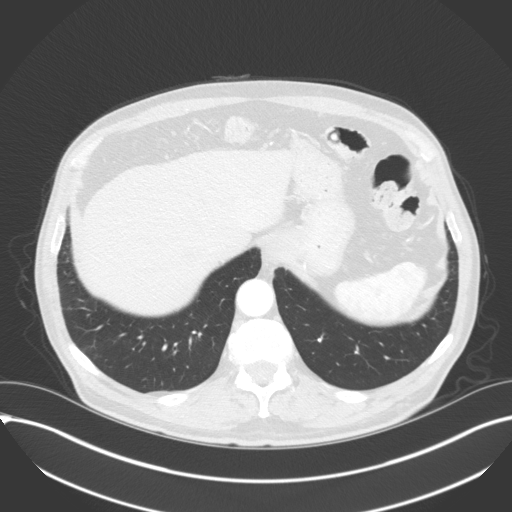
[im 32/112  soft-tissue]
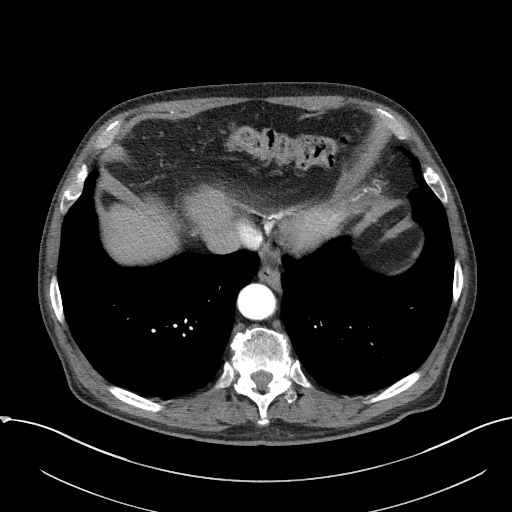
[im 40/112  lung]
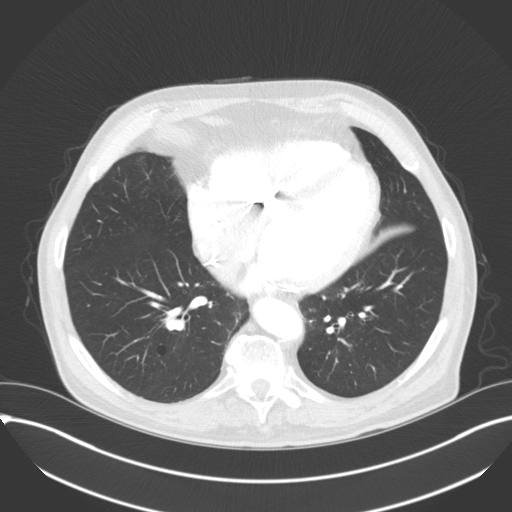
[im 48/112  soft-tissue]
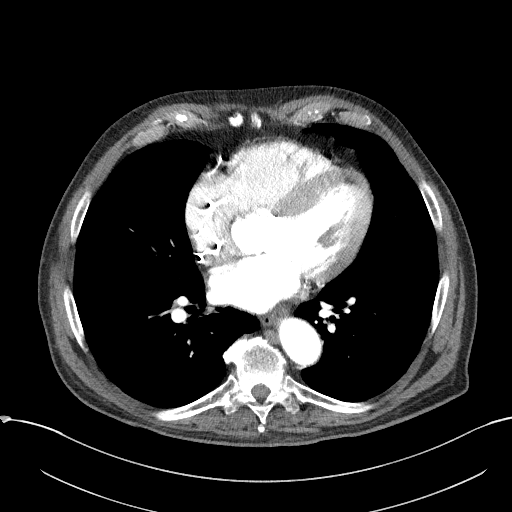
[im 56/112  lung]
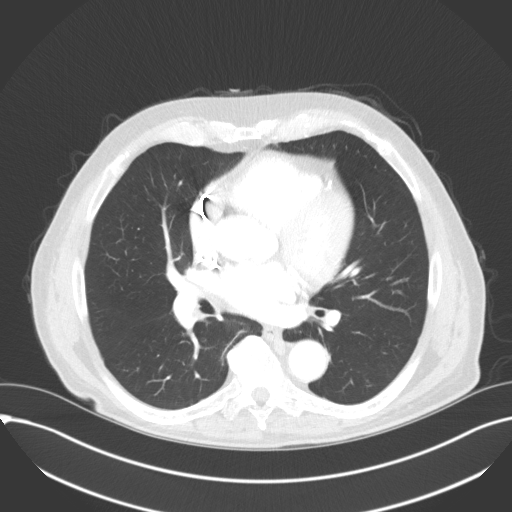
[im 64/112  soft-tissue]
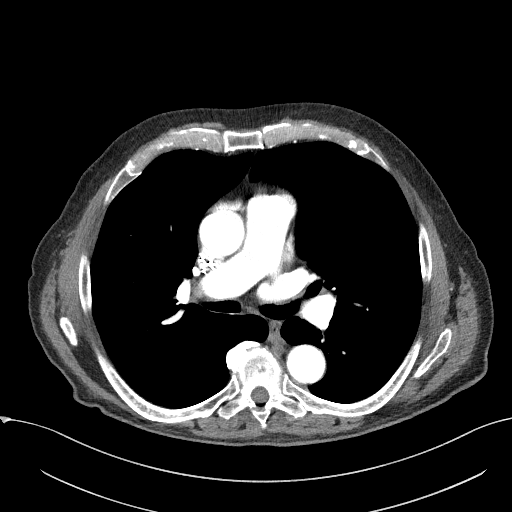
[im 72/112  lung]
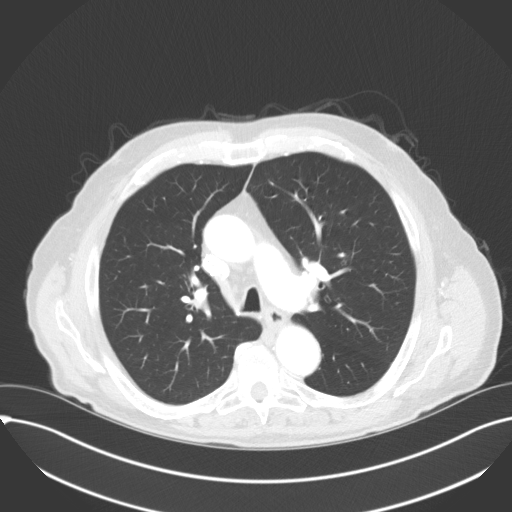
[im 80/112  soft-tissue]
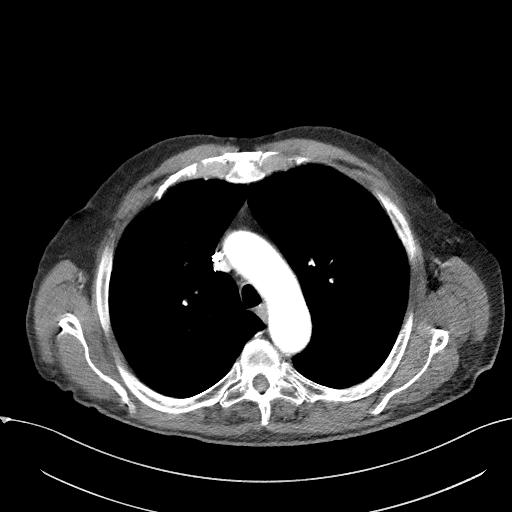
[im 88/112  lung]
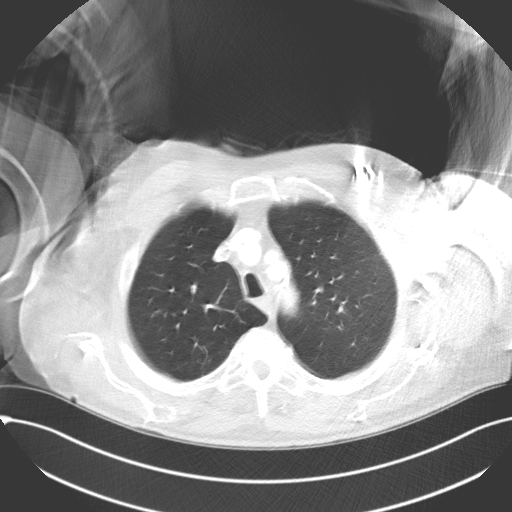
[im 96/112  soft-tissue]
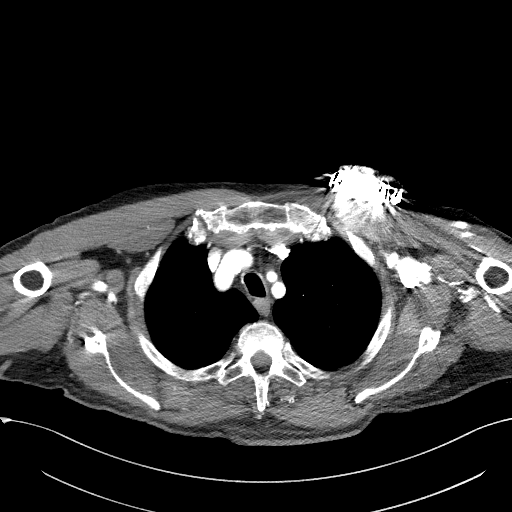
[im 104/112  lung]
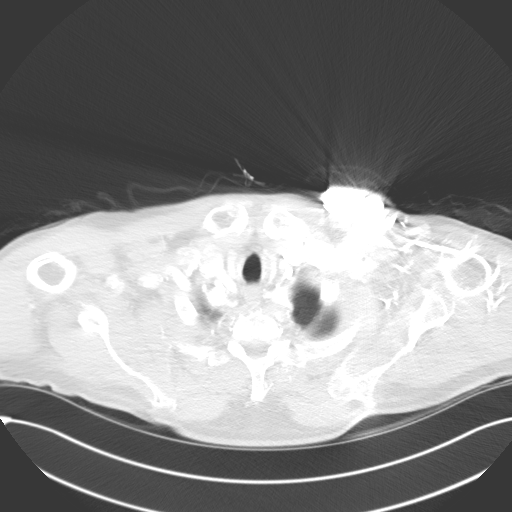

[Series 5: lung · axial · 0.81mm/px · z∈[-334,-286]mm · 2 of 112 slices shown]
[im 8/112  soft-tissue]
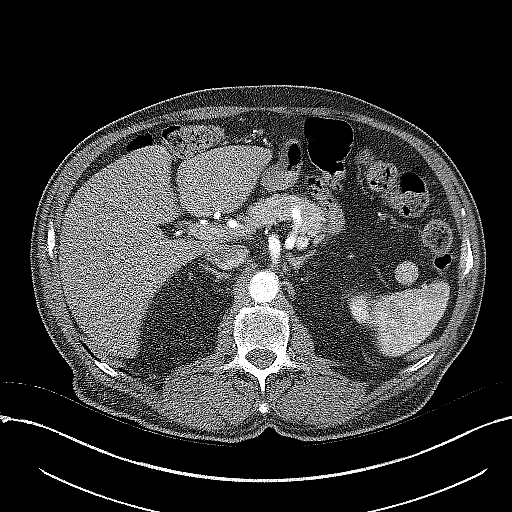
[im 24/112  soft-tissue]
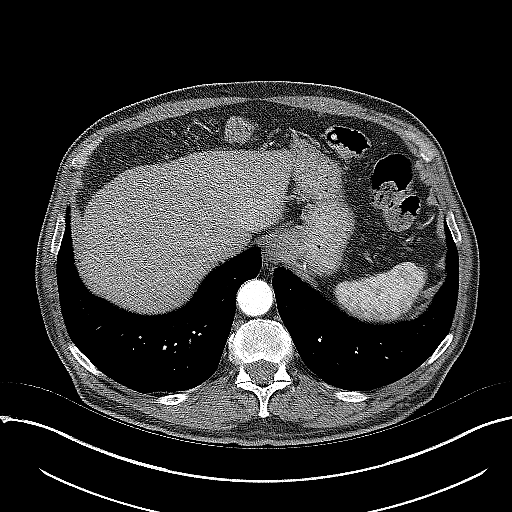

[Series 7: coronals · coronal · 0.69mm/px · 3 of 135 slices shown]
[im 34/135  soft-tissue]
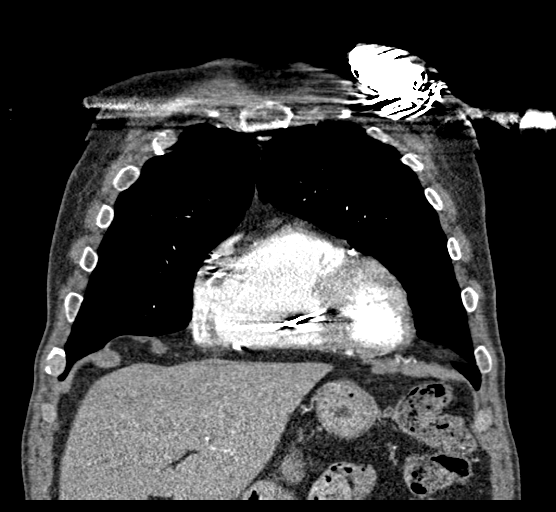
[im 68/135  soft-tissue]
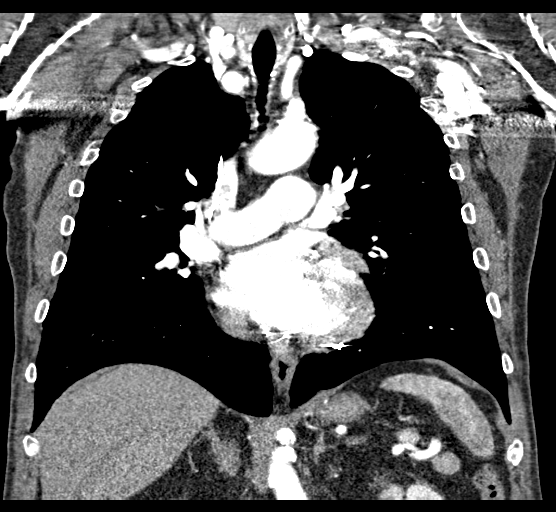
[im 101/135  soft-tissue]
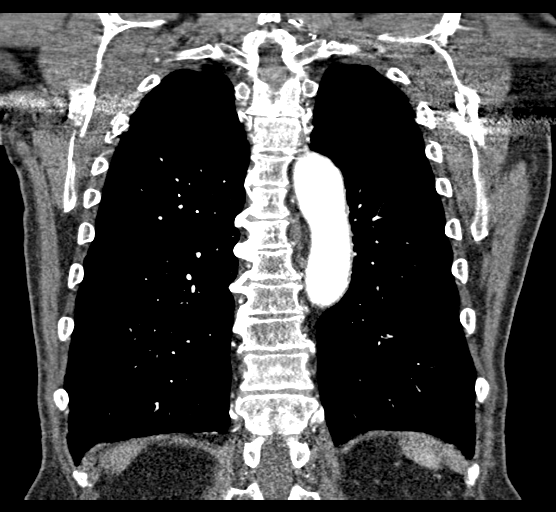

[18 of 46 positions shown; findings below may reference images not displayed]

RADIATION DOSE REDUCTION: This exam was performed according to the
departmental dose-optimization program which includes automated
exposure control, adjustment of the mA and/or kV according to
patient size and/or use of iterative reconstruction technique.

CONTRAST:  100 mL Omnipaque 350, intravenous
FINDINGS: Cardiovascular: Preferential opacification of the thoracic aorta. No
evidence of thoracic aortic aneurysm or dissection. Scattered
atherosclerotic calcifications, most prominent about the aortic
arch. Left chest wall, subclavian vein approach biventricular
pacer/AICD in place with leads in expected locations. Normal heart
size. No pericardial effusion.

Sinues of Valsalva: 32 mm 34 x 35 mm ,unchanged. Aortic valvular
calcifications are noted.

Sinotubular Junction: 33 mm ,unchanged

Ascending Aorta: 35 mm ,unchanged

Aortic Arch: 31 mm ,unchanged

Descending aorta: 31 mm at the level of the carina ,unchanged

Branch vessels: Conventional branching pattern. Mild ostial stenosis
of the left subclavian artery secondary to atherosclerotic plaque.

Coronary arteries: Normal origins and courses. Severe
atherosclerotic calcifications.

Main pulmonary artery: 30 mm ,unchanged. No evidence of central
pulmonary embolism.

Pulmonary veins: No anomalous pulmonary venous return. No evidence
of left atrial appendage thrombus.

Upper abdominal vasculature: Within normal limits.

Mediastinum/Nodes: No enlarged mediastinal, hilar, or axillary lymph
nodes. Thyroid gland, trachea, and esophagus demonstrate no
significant findings.

Lungs/Pleura: No focal consolidations. No suspicious pulmonary
nodules. No pleural effusion or pneumothorax.

Upper Abdomen: Partially visualized left simple renal cyst. The
remaining visualized upper abdomen is within normal limits.

Musculoskeletal: Multilevel degenerative changes of the thoracic
spine. No acute osseous abnormality.

Review of the MIP images confirms the above findings.
IMPRESSION: Vascular:

1. No evidence of thoracic aortic aneurysm.
2. Coronary and aortic atherosclerosis ([id number]).

Non-Vascular:

No acute intrathoracic abnormality.

## 2022-02-11 ENCOUNTER — Ambulatory Visit (INDEPENDENT_AMBULATORY_CARE_PROVIDER_SITE_OTHER): Payer: Medicare PPO

## 2022-02-11 DIAGNOSIS — Z95810 Presence of automatic (implantable) cardiac defibrillator: Secondary | ICD-10-CM

## 2022-02-11 DIAGNOSIS — I5022 Chronic systolic (congestive) heart failure: Secondary | ICD-10-CM | POA: Diagnosis not present

## 2022-02-14 NOTE — Progress Notes (Signed)
EPIC Encounter for ICM Monitoring  Patient Name: Anthony Daugherty is a 76 y.o. male Date: 02/14/2022 Primary Care Physican: Tammi Sou, MD Primary Cardiologist: Radford Pax Electrophysiologist: Allred Bi-V Pacing: 99%         11/14/2021 Office Weight: 160 lbs   AT/AF Burden: 0%                                                            Transmission reviewed.    CorVue thoracic impedance normal but was suggesting intermittent days with possible fluid accumulation in last month.   Prescribed: Spironolactone 25 mg take 0.5 tablet daily.   Labs: 10/30/2021 Creatinine 0.86, BUN 10, Potassium 3.7, Sodium 141, GFR 90 10/23/2021 Creatinine 0.98, BUN 11, Potassium 3.1, Sodium 142, GFR 80  10/04/2021 Creatinine 0.87, BUN 14, Potassium 3.7, Sodium 140  09/04/2021 Creatinine 0.96, BUN 26, Potassium 4.2, Sodium 140, GFR 82 A complete set of results can be found in Results Review.   Recommendations:  No changes.    Follow-up plan: ICM clinic phone appointment on 03/19/2022.   91 day device clinic remote transmission 03/01/2022.   EP/Cardiology Office Visits:  Recall 01/02/2023 with Dr Rayann Heman.     Copy of ICM check sent to Dr. Rayann Heman.    3 month ICM trend: 02/11/2022.    12-14 Month ICM trend:     Rosalene Billings, RN 02/14/2022 8:58 AM

## 2022-02-18 ENCOUNTER — Encounter: Payer: Medicare PPO | Admitting: Psychology

## 2022-02-25 ENCOUNTER — Ambulatory Visit: Payer: Medicare PPO | Admitting: Physician Assistant

## 2022-02-25 ENCOUNTER — Other Ambulatory Visit: Payer: Self-pay | Admitting: Cardiology

## 2022-02-26 ENCOUNTER — Other Ambulatory Visit: Payer: Self-pay | Admitting: Cardiology

## 2022-03-04 ENCOUNTER — Other Ambulatory Visit: Payer: Self-pay | Admitting: Cardiology

## 2022-03-07 ENCOUNTER — Encounter: Payer: Self-pay | Admitting: Cardiology

## 2022-03-07 ENCOUNTER — Ambulatory Visit: Payer: Medicare PPO | Admitting: Cardiology

## 2022-03-07 VITALS — BP 96/52 | HR 61 | Ht 67.5 in | Wt 166.0 lb

## 2022-03-07 DIAGNOSIS — I359 Nonrheumatic aortic valve disorder, unspecified: Secondary | ICD-10-CM

## 2022-03-07 DIAGNOSIS — I5022 Chronic systolic (congestive) heart failure: Secondary | ICD-10-CM | POA: Diagnosis not present

## 2022-03-07 DIAGNOSIS — E7800 Pure hypercholesterolemia, unspecified: Secondary | ICD-10-CM | POA: Diagnosis not present

## 2022-03-07 DIAGNOSIS — I255 Ischemic cardiomyopathy: Secondary | ICD-10-CM

## 2022-03-07 DIAGNOSIS — I2510 Atherosclerotic heart disease of native coronary artery without angina pectoris: Secondary | ICD-10-CM | POA: Diagnosis not present

## 2022-03-07 DIAGNOSIS — I10 Essential (primary) hypertension: Secondary | ICD-10-CM

## 2022-03-07 NOTE — Patient Instructions (Signed)
Medication Instructions:  Your physician recommends that you continue on your current medications as directed. Please refer to the Current Medication list given to you today.  *If you need a refill on your cardiac medications before your next appointment, please call your pharmacy*   Lab Work: None ordered  If you have labs (blood work) drawn today and your tests are completely normal, you will receive your results only by: [place] (if you have MyChart) OR A paper copy in the mail If you have any lab test that is abnormal or we need to change your treatment, we will call you to review the results.   Testing/Procedures: None ordered   Follow-Up: At [facility], you and your health needs are our priority.  As part of our continuing mission to provide you with exceptional heart care, we have created designated Provider Care Teams.  These Care Teams include your primary Cardiologist (physician) and Advanced Practice Providers (APPs -  Physician Assistants and Nurse Practitioners) who all work together to provide you with the care you need, when you need it.  We recommend signing up for the patient portal called "MyChart".  Sign up information is provided on this After Visit Summary.  MyChart is used to connect with patients for Virtual Visits (Telemedicine).  Patients are able to view lab/test results, encounter notes, upcoming appointments, etc.  Non-urgent messages can be sent to your provider as well.   To learn more about what you can do with MyChart, go to [email or web address].    Your next appointment:     6 MONTHS WITH DR. Radford Pax  September 2023 with Dr. Burt Knack severe AI  The format for your next appointment:   In Person  Provider:   Fransico Him, MD     Other Instructions   Important Information About Sugar

## 2022-03-07 NOTE — Progress Notes (Signed)
Date:  03/07/2022   ID:  STEVIN BIELINSKI, DOB 17-Dec-1945, MRN [id number]  Patient Location:  [facility]  Provider location:   [place]  PCP:  Tammi Sou, MD  Cardiologist:  Fransico Him, MD  Electrophysiologist:  [name], MD   Chief Complaint:  DCM and AI  History of Present Illness: He   Anthony Daugherty is a 76 y.o. male  with a history of aortic stenosis and AR with dilated aortic root.  He apparently had an echo 07/2017 showing mild aortic stenosis and mild to moderate AR and aortic root 35m.  He also has a history of HTN, hyperlipidemia, nonischemic DCM with EF 40-45 by cath and echo in 2012 and ASCAD with 80% OM lesion medically managed.    Repeat echo 08/2018 showed moderate AR with aortic root 371m  Unfortunately his echo also shows that his LVF has significantly declined with EF 20-25% with mildly dilated LV and diffuse HK.  There was severe LAE and moderate RAE and mild pulmonary HTN. At his initial OV in 4/9/6789his Bystolic was changed to Carvedilol and he was continued on ARB.    He underwent cath 12/2018 showing 95% pRCA, 75% OM1 and 50% prox to mid LAD.  He underwent attempted PCI of the RCA but unable to cross the lesion and medical therapy was recommended.  It was felt that his CM was mixed ICM/NICM with possible ETOH contribution as well.  He has seen my extenders several times trying to titrate HF meds but has been limited by soft Bps.  He was on Entresto 49/'51mg'$  BID, Carvedilol '25mg'$  BID and repeat echo 04/2019 showed EF 25-30% with mild AS and moderate to severe AI.  There was no M spike on SPEP/UPEP and TSH and ferritin were normal.  He was referred to EP and underwent [employer] Jude BiV ICD on 06/01/2019.    2D echo 05/2020 showed improved LVF after BiVICD with EF 45-50%, mild AS and moderate AR, mild MR. He was seen by Dr. CoBurt Knackn June 2022 for moderate to severe aortic insufficiency with mildly dilated aortic root.  It was felt that his overall LV  dimensions have been relatively stable after his EF improved with cardiac resynchronization therapy and a period of observation was recommended.  It was felt that given his multivessel coronary disease, if he did require aortic valve replaced in the future he would likely need combined CABG/AVR.  Recent 2D echocardiogram showed stable normal LV systolic function with moderate to severe AI.    He is here today for followup and is doing well.  He denies any chest pain or pressure, SOB, DOE, PND, orthopnea, LE edema, dizziness, palpitations or syncope. He is compliant with his meds and is tolerating meds with no SE.    Prior CV studies:   The following studies were reviewed today:  2D echo 12/2021 IMPRESSIONS   1. Left ventricular ejection fraction, by estimation, is 45 to 50%. Left  ventricular ejection fraction by 3D volume is 46 %. The left ventricle has  mildly decreased function. The left ventricle demonstrates global  hypokinesis. The left ventricular  internal cavity size was mildly dilated. There is mild left ventricular  hypertrophy. Left ventricular diastolic parameters are consistent with  Grade I diastolic dysfunction (impaired relaxation).   2. Right ventricular systolic function is normal. The right ventricular  size is normal. There is normal pulmonary artery systolic pressure. The  estimated right ventricular systolic pressure is 3138.1mHg.  3. Left atrial size was mildly dilated.   4. The mitral valve is grossly normal. Mild mitral valve regurgitation.  No evidence of mitral stenosis.   5. The aortic valve is abnormal. There is moderate calcification of the  aortic valve. Aortic valve regurgitation is severe, eccentric and  posteriorly directed. Holodiastolic flow reversal noted in descending  aorta Doppler. Mild aortic valve stenosis.  Aortic valve mean gradient measures 10.7 mmHg.   6. Aortic dilatation noted. There is mild dilatation of the aortic root,  measuring 40 mm.    7. The inferior vena cava is normal in size with greater than 50%  respiratory variability, suggesting right atrial pressure of 3 mmHg.   Past Medical History:  Diagnosis Date   Amnestic MCI (mild cognitive impairment with memory loss) 08/09/2021   Aortic insufficiency 10/21/2018   severe eccentric by echo 12/2021.  Severe on echo 12/2021->cardiac MRI planned   Aortic regurgitation    Mild-mod on echo 07/2017.  Moderate 08/2018.  Moderate-severe on echo 04/2019--> TEE 11/15/19 EF 50-55%, no LV dilation or WMA. Mod AV sclerosis w/out stenosis, mod-to-sev AI-.>rpt echo 6 mo. 11/2020 moderate AI   Aortic root dilatation 10/2018   11/17/19 45 mm aortic root at the sinuses of Valsalva-> 52022 echo->40 mm, unchanged on echo 12/2021   Aortic stenosis, mild    mild by echo 12/2021   Arthritis    L-Spine   Cardiac murmur    Systolic and diastolic as of 09/5007.   Cardiomyopathy 10/12/2010   Global hypokinesis, EF 40-45% by cath and echo (2012 echo and 2019 echo).  Feb 2020 EF 20-25%. - mixed ICM/NICM (alcohol and CAD)  October 2020 EF 25-30%. 11/2020 EF 40-45%. 06/2021 EF 45-50%,dil LV+hypokinesis,mod AI,grd I DD.   Chronic systolic (congestive) heart failure ([place]) 06/01/2019   max med mgmt, plus CRT-D placed 05/2019, EF improved to 50% after.   Coronary artery disease 11/16/2010   a. 2012 - 80% OM lesion. b. Cath 12/2018 - showing 95% prox RCA, 75% OM1, 50% prox-mid LAD. Unsuccessful PCI to 100% chronically occluded RCA summer 2020. CT angio chest w/aorta 08/2021->severe coronary calc's, no aneurism. Mild subclav stenosis d/t athero   Diverticulosis    Erectile dysfunction 08/16/2011   Essential hypertension 07/19/2010   GAD (generalized anxiety disorder)    managed by Dr. Toy Care with prozac '20mg'$  qd   Gout    Usually Right great toe; colchicine helps well   Gouty arthritis of toe of left foot 05/27/2013   Habitual alcohol use    Hx of colonoscopy 2004; 2014   Initial was normal; 2014-polypectomy and  diverticulosis.  Recall 2019.   Hyperlipidemia 10/23/2012   LBBB (left bundle branch block)    CRT-D placed 05/2019   Low back strain 10/24/2013   Mild neurocognitive disorder    Neurocognitive testing 2023.  Question early stage Alzheimer's   Obesity, Class I, BMI 30-34.9    Pulmonary nodule    0.5 cm RLL subpleural nodule.  Pt NEVER SMOKER.  Low risk->no f/u imaging is needed.   Subclavian artery stenosis, left (HCC)    mild, detected on CT angio chest w/aorta 08/2021   Thyroid nodule 04/23/2013   Past Surgical History:  Procedure Laterality Date   BIV ICD INSERTION CRT-D N/A 06/01/2019   Procedure: BIV ICD INSERTION CRT-D;  Surgeon: [name], MD;  Location: [place] [facility];  Service: Cardiovascular;  Laterality: N/A;   CARDIAC CATHETERIZATION  10/12/2010   diagnostic only: 1 vessel CAD with TIMI  3 flow, EF 40-45%.  Mild AS.  Medical mgmt of CAD.   COLONOSCOPY     COLONOSCOPY W/ POLYPECTOMY  10/06/2012   Diverticulosis and tubular adenoma w/out high grade dysplasia.  Repeat 5 yrs (Dr. Deatra Ina).   CORONARY ATHERECTOMY N/A 01/08/2019   100% chronic RCA occlusion impossible to clear.  Med mgmt. Procedure: CORONARY ATHERECTOMY;  Surgeon: Sherren Mocha, MD;  Location: [place] [facility];  Service: Cardiovascular;  Laterality: N/A;   POLYPECTOMY     RIGHT HEART CATH AND CORONARY ANGIOGRAPHY N/A 12/24/2018   95% RCA occlusion, 75% obtuse marginal occlusion (3V dz). Procedure: RIGHT HEART CATH AND CORONARY ANGIOGRAPHY;  Surgeon: Lorretta Harp, MD;  Location: [place] [facility];  Service: Cardiovascular;  Laterality: N/A;   TEE WITHOUT CARDIOVERSION N/A 11/15/2019   EF 50-55%, no LV dilation or WMA. Mod AV sclerosis w/out stenosis, mod-to-sev AI.  Procedure: TRANSESOPHAGEAL ECHOCARDIOGRAM (TEE);  Surgeon: Fay Records, MD;  Location: [facility];  Service: Cardiovascular;  Laterality: N/A;   TRANSTHORACIC ECHOCARDIOGRAM  09/2010; 07/2017; 08/2018; 10/2019;05/2020   2012:  EF  40-45%, global hypokinesis, mild AS.  07/2017: no chg except mild/mod AR.  08/2018 EF 20-25%, global LV hypok, DD, biatrial enlargemt, mod AR, aortic root 3.8 cm. 10/2019 EF 50%, mild dec RV fxn, mod-sev AR, aor root dil 42 mm. 05/2020 EF 45-50%,grd I DD, mod AI, aorta 70m, severe LAE. 10/2020 EF 45-50%, mild LVH, global hypokin, grd I DD, mod AI. Echo 12/2021 EF 45-50%, severe AR     Current Meds  Medication Sig   aspirin EC 81 MG tablet Take 81 mg by mouth daily. Swallow whole.   atorvastatin (LIPITOR) 40 MG tablet TAKE 1 TABLET DAILY   donepezil (ARICEPT) 10 MG tablet Take 1 tablet 10 mg daily   empagliflozin (JARDIANCE) 10 MG TABS tablet Take 1 tablet (10 mg total) by mouth daily before breakfast.   FLUoxetine (PROZAC) 20 MG capsule Take 1 capsule (20 mg total) by mouth daily.   metoprolol succinate (TOPROL-XL) 100 MG 24 hr tablet TAKE ONE TABLET BY MOUTH DAILY WITH A MEAL   sacubitril-valsartan (ENTRESTO) 97-103 MG Take 1 tablet by mouth 2 (two) times daily.   spironolactone (ALDACTONE) 25 MG tablet TAKE ONE TABLET BY MOUTH DAILY   thiamine (VITAMIN B-1) 100 MG tablet Take 1 tablet (100 mg total) by mouth daily.   [DISCONTINUED] Folic Acid 0.8 MG CAPS 1 cap po daily     Allergies:   Penicillins   Social History   Tobacco Use   Smoking status: Former   Smokeless tobacco: Current    Types: Chew   Tobacco comments:    chews tobacco  Vaping Use   Vaping Use: Never used  Substance Use Topics   Alcohol use: Yes    Alcohol/week: 28.0 standard drinks of alcohol    Types: 28 Cans of beer per week    Comment: daily   Drug use: No     Family Hx: The patient's family history includes Alzheimer's disease in his mother; COPD in his father; Congenital heart disease in his brother; Heart failure in his father. There is no history of Colon cancer, Esophageal cancer, Stomach cancer, Rectal cancer, or Colon polyps.  ROS:   Please see the history of present illness.     All other systems  reviewed and are negative.   Labs/Other Tests and Data Reviewed:    Recent Labs: 04/12/2021: TSH 3.45 10/04/2021: ALT 10 10/30/2021: BUN 10; Creatinine, Ser 0.86; Potassium  3.7; Sodium 141   Recent Lipid Panel Lab Results  Component Value Date/Time   CHOL 127 10/04/2021 11:25 AM   CHOL 141 04/26/2019 09:12 AM   TRIG 42.0 10/04/2021 11:25 AM   TRIG 105 05/10/2008 12:00 AM   HDL 61.30 10/04/2021 11:25 AM   HDL 72 04/26/2019 09:12 AM   CHOLHDL 2 10/04/2021 11:25 AM   LDLCALC 57 10/04/2021 11:25 AM   LDLCALC 57 04/26/2019 09:12 AM   LDLCALC 101 05/10/2008 12:00 AM    Wt Readings from Last 3 Encounters:  03/07/22 166 lb (75.3 kg)  01/07/22 162 lb (73.5 kg)  12/13/21 164 lb (74.4 kg)     Objective:    Vital Signs:  BP (!) 96/52   Pulse 61   Ht 5' 7.5" (1.715 m)   Wt 166 lb (75.3 kg)   SpO2 97%   BMI 25.62 kg/m   GEN: Well nourished, well developed in no acute distress HEENT: Normal NECK: No JVD; No carotid bruits LYMPHATICS: No lymphadenopathy CARDIAC:RRR, no  rubs, gallops, 2/6 diastolic murmur at RUSB to LLSB RESPIRATORY:  Clear to auscultation without rales, wheezing or rhonchi  ABDOMEN: Soft, non-tender, non-distended MUSCULOSKELETAL:  No edema; No deformity  SKIN: Warm and dry NEUROLOGIC:  Alert and oriented x 3 PSYCHIATRIC:  Normal affect   ASSESSMENT & PLAN:    1.  Severe mixed DCM -EF on last echo 05/2020 showed improved LVF with EF 45-50% after BiV AICD implant a year ago and EF continues to remain stable at 45 to 50% on echo 06/20/2021 -He appears euvolemic on exam today -s/p CRT-D in 2020 with improvement in EF>>followed in device clinic -continue prescription drug management with Toprol '100mg'$  daily, Entresto 97-'103mg'$  BID, Jardiance '10mg'$  daily and spiro '25mg'$  daily -I have personally reviewed and interpreted outside labs performed by patient's PCP which showed serum creatinine 0.86 and potassium 3.7 on 10/30/2021  2.  Chronic combined systolic/diastolic  CHF -he is NYHA class 2a -He appears euvolemic on exam and denies any lower extremity edema or shortness of breath.  Weight is stable. -Continue drug management Toprol-XL 200 mg daily, Entresto 97-23 mg twice daily, Jardiance 10 mg daily and spironolactone 25 mg daily with as needed refills  3.  Aortic valve disease -2D echo 06-2021 shows stable low normal LV function with EF 45 to 50% with moderate to severe eccentric AI -mild AS with mean AVG 12 mmHg by echo 12/22 -He is being followed in structural heart clinic by Dr. Burt Knack  -He remains asymptomatic -He has a cMRI/MRA to assess AI further -If his AI worsens or LV dimensions and LV function change or he becomes symptomatic he will likely need CABG/AVR given his multivessel CAD as well -I will have him followup with Structural heart next month after cMRI  4.  Mild ascending aortic and aortic root dilatation -Chest CTA measuring aortic root at SOV 45m at SOV, 393min ascending aorta and abdominal CTA with aortic atherosclerosis but no AAA -he is getting a cMRI next week -continue statin and BP control  5.  ASCAD - cath 12/2018 showing 95% pRCA, 75% OM1 and 50% prox to mid LAD.  He underwent attempted PCI of the RCA but unable to cross the lesion and medical therapy was recommended. -He denies any anginal symptoms -Continue triple drug therapy with aspirin 81 mg daily, Lipitor 40 mg daily Toprol-XL 100 mg daily with as needed refills  6.  HLD -LDL goal < 70 -Continue new prescription drug management with  atorvastatin 40 mg daily with.  Refills -I have personally reviewed and interpreted outside labs performed by patient's PCP which showed LDL 57, HDL 61.3 on 10/04/2021  7.  HTN -BP is controlled on exam today -Continue prescription drug management with 100 mg daily, Entresto 97-23 mg twice daily, spironolactone 25 mg daily with as needed refills  Medication Adjustments/Labs and Tests Ordered: Current medicines are reviewed at length  with the patient today.  Concerns regarding medicines are outlined above.  Tests Ordered: No orders of the defined types were placed in this encounter.  Medication Changes: No orders of the defined types were placed in this encounter.   Disposition:  Follow up in 1 year  Signed, Fransico Him, MD  03/07/2022 9:36 AM    [place]

## 2022-03-11 ENCOUNTER — Ambulatory Visit: Payer: Medicare PPO | Admitting: Physician Assistant

## 2022-03-11 ENCOUNTER — Telehealth (HOSPITAL_COMMUNITY): Payer: Self-pay | Admitting: Emergency Medicine

## 2022-03-11 NOTE — Telephone Encounter (Signed)
Reaching out to patient to offer assistance regarding upcoming cardiac imaging study; pt verbalizes understanding of appt date/time, parking situation and where to check in, pre-test NPO status and medications ordered, and verified current allergies; name and call back number provided for further questions should they arise Marchia Bond RN Navigator Cardiac Imaging [facility] [phone] office [phone] cell  [facility] / [employer] Jude CRTD pacer/defib MR Conditional Arrival 1230 w/c entrance Denies other metal implants Denies iv issues Denies claustro

## 2022-03-12 ENCOUNTER — Other Ambulatory Visit: Payer: Self-pay | Admitting: Cardiology

## 2022-03-12 ENCOUNTER — Other Ambulatory Visit (HOSPITAL_COMMUNITY): Payer: Self-pay | Admitting: Cardiology

## 2022-03-12 DIAGNOSIS — I77810 Thoracic aortic ectasia: Secondary | ICD-10-CM

## 2022-03-13 ENCOUNTER — Ambulatory Visit (HOSPITAL_COMMUNITY)
Admission: RE | Admit: 2022-03-13 | Discharge: 2022-03-13 | Disposition: A | Discharge status: Home / Self Care | Payer: Medicare PPO | Source: Ambulatory Visit | Attending: Cardiology | Admitting: Cardiology

## 2022-03-13 ENCOUNTER — Ambulatory Visit (HOSPITAL_BASED_OUTPATIENT_CLINIC_OR_DEPARTMENT_OTHER)
Admission: RE | Admit: 2022-03-13 | Discharge: 2022-03-13 | Disposition: A | Discharge status: Home / Self Care | Payer: Medicare PPO | Source: Ambulatory Visit

## 2022-03-13 ENCOUNTER — Other Ambulatory Visit: Payer: Self-pay | Admitting: Cardiology

## 2022-03-13 DIAGNOSIS — I255 Ischemic cardiomyopathy: Secondary | ICD-10-CM | POA: Diagnosis not present

## 2022-03-13 DIAGNOSIS — I350 Nonrheumatic aortic (valve) stenosis: Secondary | ICD-10-CM

## 2022-03-13 DIAGNOSIS — I351 Nonrheumatic aortic (valve) insufficiency: Secondary | ICD-10-CM | POA: Diagnosis not present

## 2022-03-13 DIAGNOSIS — I5022 Chronic systolic (congestive) heart failure: Secondary | ICD-10-CM | POA: Diagnosis not present

## 2022-03-13 DIAGNOSIS — I77810 Thoracic aortic ectasia: Secondary | ICD-10-CM | POA: Diagnosis not present

## 2022-03-13 MED ORDER — GADOBUTROL 1 MMOL/ML IV SOLN
7.0000 mL | Freq: Once | INTRAVENOUS | Status: AC | PRN
Start: 2022-03-13 — End: 2022-03-13
  Administered 2022-03-13: 7 mL via INTRAVENOUS

## 2022-03-13 NOTE — Progress Notes (Signed)
Per order the device rep with [facility], Asa, changed device settings for MRI to  DOO at 85 bpm  MRI mode/ Tachy-therapies to off  I will program device back to pre-MRI settings after completion of exam, and send transmission

## 2022-03-14 ENCOUNTER — Other Ambulatory Visit (HOSPITAL_COMMUNITY): Payer: Self-pay | Admitting: Cardiology

## 2022-03-14 DIAGNOSIS — I350 Nonrheumatic aortic (valve) stenosis: Secondary | ICD-10-CM

## 2022-03-19 ENCOUNTER — Encounter: Payer: Self-pay | Admitting: Family Medicine

## 2022-03-22 NOTE — Progress Notes (Signed)
No ICM remote transmission received for 03/19/2022 and next ICM transmission scheduled for 03/26/2022.

## 2022-03-27 NOTE — Progress Notes (Signed)
No ICM remote transmission received for 03/26/2022 and next ICM transmission scheduled for 04/01/2022.

## 2022-04-02 ENCOUNTER — Ambulatory Visit: Payer: Medicare PPO | Attending: Cardiovascular Disease | Admitting: Cardiovascular Disease

## 2022-04-02 ENCOUNTER — Encounter: Payer: Self-pay | Admitting: Cardiovascular Disease

## 2022-04-02 VITALS — BP 110/70 | HR 63 | Ht 66.0 in | Wt 168.2 lb

## 2022-04-02 DIAGNOSIS — I351 Nonrheumatic aortic (valve) insufficiency: Secondary | ICD-10-CM

## 2022-04-02 NOTE — Progress Notes (Signed)
Cardiology Office Note:    Date:  04/02/2022   ID:  Anthony Daugherty, DOB Oct 29, 1945, MRN [id number]  PCP:  Tammi Sou, MD   [facility] Providers Cardiologist:  Fransico Him, MD Electrophysiologist:  [name], MD     Referring MD: Tammi Sou, MD   Chief Complaint  Patient presents with   Aortic Insuffiency    History of Present Illness:    Anthony Daugherty is a 76 y.o. male with a hx of aortic insufficiency and nonischemic cardiomyopathy, presenting for follow-up evaluation.  Comorbid conditions include hypertension, multivessel CAD, left bundle branch block status post CRT-D in 2020, ascending aortic aneurysm, and cognitive dysfunction consistent with early dementia.  The patient is here with his wife today.  He claims to have no symptoms at all.  He specifically denies chest pain, chest pressure, shortness of breath, edema, or heart palpitations.  He has had no lightheadedness or syncope.  He denies any ICD discharges.  The patient initially states that he has been active in his garden and has been tilling the ground.  His wife reports that this is not true and he is quite sedentary.  I think the patient is thinking back several years to when he was more active.  He is able to walk up and down stairs in their home with no symptoms associated with this level of activity.  A recent echo and cardiac MR study showed severe aortic insufficiency and modest decline in his LVEF.  Because of these findings, he was referred back for repeat evaluation of his valvular heart disease.  Past Medical History:  Diagnosis Date   Amnestic MCI (mild cognitive impairment with memory loss) 08/09/2021   Aortic insufficiency 10/21/2018   Mild-mod on echo 07/2017.  Moderate 08/2018.  Moderate-severe on echo 04/2019--> TEE 11/15/19 EF 50-55%, no LV dilation or WMA. Mod AV sclerosis w/out stenosis, mod-to-sev AI. 11/2020 moderate AI. severe eccentric by echo 12/2021.  Severe on echo  12/2021->cardiac MRI confirmed   Aortic root dilatation 10/2018   11/17/19 45 mm aortic root at the sinuses of Valsalva-> 52022 echo->40 mm, unchanged on echo 12/2021   Aortic stenosis, mild    mild by echo 12/2021   Arthritis    L-Spine   Cardiac murmur    Systolic and diastolic as of 0/9735.   Cardiomyopathy 10/12/2010   Global hypokinesis, EF 40-45% by cath and echo (2012 echo and 2019 echo).  Feb 2020 EF 20-25%. - mixed ICM/NICM (alcohol and CAD)  October 2020 EF 25-30%. 11/2020 EF 40-45%. 06/2021 EF 45-50%,dil LV+hypokinesis,mod AI,grd I DD.   Chronic systolic (congestive) heart failure ([facility]) 06/01/2019   max med mgmt, plus CRT-D placed 05/2019, EF improved to 50% after. Cardiac MRI 03/2022 EF 40%   Coronary artery disease 11/16/2010   a. 2012 - 80% OM lesion. b. Cath 12/2018 - showing 95% prox RCA, 75% OM1, 50% prox-mid LAD. Unsuccessful PCI to 100% chronically occluded RCA summer 2020. CT angio chest w/aorta 08/2021->severe coronary calc's, no aneurism. Mild subclav stenosis d/t athero   Diverticulosis    Erectile dysfunction 08/16/2011   Essential hypertension 07/19/2010   GAD (generalized anxiety disorder)    managed by Dr. Toy Care with prozac '20mg'$  qd   Gout    Usually Right great toe; colchicine helps well   Gouty arthritis of toe of left foot 05/27/2013   Habitual alcohol use    Hx of colonoscopy 2004; 2014   Initial was normal; 2014-polypectomy and diverticulosis.  Recall 2019.   Hyperlipidemia 10/23/2012   LBBB (left bundle branch block)    CRT-D placed 05/2019   Low back strain 10/24/2013   Mild neurocognitive disorder    Neurocognitive testing 2023.  Question early stage Alzheimer's   Obesity, Class I, BMI 30-34.9    Pulmonary nodule    0.5 cm RLL subpleural nodule.  Pt NEVER SMOKER.  Low risk->no f/u imaging is needed.   Subclavian artery stenosis, left (HCC)    mild, detected on CT angio chest w/aorta 08/2021   Thyroid nodule 04/23/2013    Past Surgical History:   Procedure Laterality Date   BIV ICD INSERTION CRT-D N/A 06/01/2019   Procedure: BIV ICD INSERTION CRT-D;  Surgeon: [name], MD;  Location: [facility];  Service: Cardiovascular;  Laterality: N/A;   CARDIAC CATHETERIZATION  10/12/2010   diagnostic only: 1 vessel CAD with TIMI 3 flow, EF 40-45%.  Mild AS.  Medical mgmt of CAD.   COLONOSCOPY     COLONOSCOPY W/ POLYPECTOMY  10/06/2012   Diverticulosis and tubular adenoma w/out high grade dysplasia.  Repeat 5 yrs (Dr. Deatra Ina).   CORONARY ATHERECTOMY N/A 01/08/2019   100% chronic RCA occlusion impossible to clear.  Med mgmt. Procedure: CORONARY ATHERECTOMY;  Surgeon: Sherren Mocha, MD;  Location: [facility];  Service: Cardiovascular;  Laterality: N/A;   POLYPECTOMY     RIGHT HEART CATH AND CORONARY ANGIOGRAPHY N/A 12/24/2018   95% RCA occlusion, 75% obtuse marginal occlusion (3V dz). Procedure: RIGHT HEART CATH AND CORONARY ANGIOGRAPHY;  Surgeon: Lorretta Harp, MD;  Location: [facility];  Service: Cardiovascular;  Laterality: N/A;   TEE WITHOUT CARDIOVERSION N/A 11/15/2019   EF 50-55%, no LV dilation or WMA. Mod AV sclerosis w/out stenosis, mod-to-sev AI.  Procedure: TRANSESOPHAGEAL ECHOCARDIOGRAM (TEE);  Surgeon: Fay Records, MD;  Location: [facility];  Service: Cardiovascular;  Laterality: N/A;   TRANSTHORACIC ECHOCARDIOGRAM  09/2010; 07/2017; 08/2018; 10/2019;05/2020   2012:  EF 40-45%, global hypokinesis, mild AS.  07/2017: no chg except mild/mod AR.  08/2018 EF 20-25%, global LV hypok, DD, biatrial enlargemt, mod AR, aortic root 3.8 cm. 10/2019 EF 50%, mild dec RV fxn, mod-sev AR, aor root dil 42 mm. 05/2020 EF 45-50%,grd I DD, mod AI, aorta 33m, severe LAE. 10/2020 EF 45-50%, mild LVH, global hypokin, grd I DD, mod AI. Echo 12/2021 EF 45-50%, severe AR    Current Medications: Current Meds  Medication Sig   aspirin EC 81 MG tablet Take 81 mg by mouth daily. Swallow whole.   atorvastatin (LIPITOR) 40 MG tablet  TAKE 1 TABLET DAILY   donepezil (ARICEPT) 10 MG tablet Take 1 tablet 10 mg daily   empagliflozin (JARDIANCE) 10 MG TABS tablet Take 1 tablet (10 mg total) by mouth daily before breakfast.   FLUoxetine (PROZAC) 20 MG capsule Take 1 capsule (20 mg total) by mouth daily.   folic acid (FOLVITE) 1 MG tablet Take 1 mg by mouth daily.   metoprolol succinate (TOPROL-XL) 100 MG 24 hr tablet TAKE ONE TABLET BY MOUTH DAILY WITH A MEAL   sacubitril-valsartan (ENTRESTO) 97-103 MG Take 1 tablet by mouth 2 (two) times daily.   spironolactone (ALDACTONE) 25 MG tablet TAKE ONE TABLET BY MOUTH DAILY   thiamine (VITAMIN B-1) 100 MG tablet Take 1 tablet (100 mg total) by mouth daily.     Allergies:   Penicillins   Social History   Socioeconomic History   Marital status: Married    Spouse name:  Thayer Headings   Number of children: 2   Years of education: 16   Highest education level: Bachelor's degree (e.g., BA, AB, BS)  Occupational History   Occupation: Retired    Fish farm manager: [employer]  Tobacco Use   Smoking status: Former   Smokeless tobacco: Current    Types: Chew   Tobacco comments:    chews tobacco  Vaping Use   Vaping Use: Never used  Substance and Sexual Activity   Alcohol use: Yes    Alcohol/week: 28.0 standard drinks of alcohol    Types: 28 Cans of beer per week    Comment: daily   Drug use: No   Sexual activity: Yes  Other Topics Concern   Not on file  Social History Narrative   Retired Education officer, community for dental lab.  Married, two children.   Pt denies ever smoking cigarettes.   +Alcohol.   Lives in [place].   Right handed   Drinks caffeine   Two story home   Social Determinants of Health   Financial Resource Strain: Low Risk  (08/29/2021)   Overall Financial Resource Strain (CARDIA)    Difficulty of Paying Living Expenses: Not hard at all  Food Insecurity: No Food Insecurity (08/29/2021)   Hunger Vital Sign    Worried About Running Out of Food in the Last Year: Never  true    Ran Out of Food in the Last Year: Never true  Transportation Needs: No Transportation Needs (08/29/2021)   PRAPARE - Hydrologist (Medical): No    Lack of Transportation (Non-Medical): No  Physical Activity: Inactive (08/29/2021)   Exercise Vital Sign    Days of Exercise per Week: 0 days    Minutes of Exercise per Session: 0 min  Stress: No Stress Concern Present (08/29/2021)   [place]    Feeling of Stress : Not at all  Social Connections: Unknown (08/29/2021)   Social Connection and Isolation Panel [NHANES]    Frequency of Communication with Friends and Family: Once a week    Frequency of Social Gatherings with Friends and Family: More than three times a week    Attends Religious Services: Not on Advertising copywriter or Organizations: No    Attends Archivist Meetings: Never    Marital Status: Married     Family History: The patient's family history includes Alzheimer's disease in his mother; COPD in his father; Congenital heart disease in his brother; Heart failure in his father. There is no history of Colon cancer, Esophageal cancer, Stomach cancer, Rectal cancer, or Colon polyps.  ROS:   Please see the history of present illness.    All other systems reviewed and are negative.  EKGs/Labs/Other Studies Reviewed:    The following studies were reviewed today: 2D echocardiogram 01/08/2022: 1. Left ventricular ejection fraction, by estimation, is 45 to 50%. Left  ventricular ejection fraction by 3D volume is 46 %. The left ventricle has  mildly decreased function. The left ventricle demonstrates global  hypokinesis. The left ventricular  internal cavity size was mildly dilated. There is mild left ventricular  hypertrophy. Left ventricular diastolic parameters are consistent with  Grade I diastolic dysfunction (impaired relaxation).   2. Right ventricular  systolic function is normal. The right ventricular  size is normal. There is normal pulmonary artery systolic pressure. The  estimated right ventricular systolic pressure is 16.1 mmHg.   3. Left  atrial size was mildly dilated.   4. The mitral valve is grossly normal. Mild mitral valve regurgitation.  No evidence of mitral stenosis.   5. The aortic valve is abnormal. There is moderate calcification of the  aortic valve. Aortic valve regurgitation is severe, eccentric and  posteriorly directed. Holodiastolic flow reversal noted in descending  aorta Doppler. Mild aortic valve stenosis.  Aortic valve mean gradient measures 10.7 mmHg.   6. Aortic dilatation noted. There is mild dilatation of the aortic root,  measuring 40 mm.   7. The inferior vena cava is normal in size with greater than 50%  respiratory variability, suggesting right atrial pressure of 3 mmHg.   Conclusion(s)/Recommendation(s): Consider cardiac MRI for quantitation of  aortic valve regurgitation given eccentric jet.   Cardiac MRI 03/13/2022: 1. Normal left ventricular size, with LVEDD 55 mm, and LVEDVi 85 mL/m2.   Normal left ventricular thickness, with intraventricular septal thickness of 9 mm, posterior wall thickness of 8 mm, and myocardial mass index of 67 g/m2.   Normal left ventricular systolic function (LVEF =46%). There are no regional wall motion abnormalities, but global hypokinesis.   Left ventricular parametric mapping notable for normal T1 signal. Limited assessment due to CIED artifact.   There is notable artifact from patients RV CIED. Unable to assess LGE.   2. Normal right ventricular size with RVEDVI 63 mL/m2.   Normal right ventricular thickness.   Normal right ventricular systolic function (RVEF =80%). There are no regional wall motion abnormalities or aneurysms.   3. Normal left and right atrial size. There is a right atrial lead CIED. Suspect termination in the right atrial appendage, but  not well visualized.   4. Normal size of the aortic root, ascending aorta and pulmonary artery.   5. Valve assessment:   Aortic Valve: Tri-leaflet aortic valve. Mild thickening. Eccentric severe aortic regurgitation. Regurgitant fraction 45%. There is holodiastolic flow reversal seen in the descending aorta.   Pulmonic Valve: Mild pulmonic insufficiency, regurgitant fraction 13%.   Tricuspid Valve: Qualitatively, there is no significant regurgitation. Significant artifact from CIED lead diminishes accuracy of TR quantification secondary to RV volumes.   Mitral Valve: Mild to moderate central regurgitation with significant artifact, regurgitant fraction 21%.   6.  Normal pericardium.  No pericardial effusion.   7. Grossly, no extracardiac findings. Recommended dedicated study if concerned for non-cardiac pathology.   8. Significant Artifact noted from left sided CIED and RA and RV leads. CS Lead not well visualized.   EKG:  EKG is not ordered today.   Recent Labs: 04/12/2021: TSH 3.45 10/04/2021: ALT 10 10/30/2021: BUN 10; Creatinine, Ser 0.86; Potassium 3.7; Sodium 141  Recent Lipid Panel    Component Value Date/Time   CHOL 127 10/04/2021 1125   CHOL 141 04/26/2019 0912   TRIG 42.0 10/04/2021 1125   TRIG 105 05/10/2008 0000   HDL 61.30 10/04/2021 1125   HDL 72 04/26/2019 0912   CHOLHDL 2 10/04/2021 1125   VLDL 8.4 10/04/2021 1125   LDLCALC 57 10/04/2021 1125   LDLCALC 57 04/26/2019 0912   LDLCALC 101 05/10/2008 0000     Risk Assessment/Calculations:                Physical Exam:    VS:  BP 110/70   Pulse 63   Ht '5\' 6"'$  (1.676 m)   Wt 168 lb 3.2 oz (76.3 kg)   SpO2 97%   BMI 27.15 kg/m     Wt Readings from Last 3  Encounters:  04/02/22 168 lb 3.2 oz (76.3 kg)  03/07/22 166 lb (75.3 kg)  01/07/22 162 lb (73.5 kg)     GEN:  Well nourished, well developed in no acute distress HEENT: Normal NECK: No JVD; No carotid bruits LYMPHATICS: No  lymphadenopathy CARDIAC: RRR, no murmurs, rubs, gallops RESPIRATORY:  Clear to auscultation without rales, wheezing or rhonchi  ABDOMEN: Soft, non-tender, non-distended MUSCULOSKELETAL:  No edema; No deformity  SKIN: Warm and dry NEUROLOGIC:  Alert and oriented x 3 PSYCHIATRIC:  Normal affect   ASSESSMENT:    1. Nonrheumatic aortic valve insufficiency    PLAN:    In order of problems listed above:  The patient has severe aortic insufficiency with mildly dilated aortic root.  He has a longstanding cardiomyopathy and has undergone CRT-D.  The patient is on an excellent medical program.  Recent echo and cardiac MRI assessment has demonstrated mild to moderate LV dysfunction with LVEF 40% by MRI and 45 to 50% by echo.  Both studies confirm severe eccentric aortic valve insufficiency.  The patient has no evidence of LV dilatation and his pulse pressure is normal.  He is completely asymptomatic.  He is not at all interested in pursuing cardiac surgery.  The patient is here with his wife today and we discussed this at length.  His biggest issue of late is that he is sleeping a lot and he has developed short-term memory impairment likely consistent with Alzheimer's dementia.  There is also question of whether long-term alcohol use has precipitated his memory problems.  Considering all of these issues, I agree that it is not prudent to pursue further work-up for aortic valve replacement.  I think he will be best treated with conservative measures and continued surveillance.  The patient will follow-up with Dr. Radford Pax and I am happy to see him back as needed in the future.  No changes are made in his medical program today.           Medication Adjustments/Labs and Tests Ordered: Current medicines are reviewed at length with the patient today.  Concerns regarding medicines are outlined above.  No orders of the defined types were placed in this encounter.  No orders of the defined types were placed  in this encounter.   Patient Instructions  Medication Instructions:  Your physician recommends that you continue on your current medications as directed. Please refer to the Current Medication list given to you today.  *If you need a refill on your cardiac medications before your next appointment, please call your pharmacy*   Lab Work: NONE If you have labs (blood work) drawn today and your tests are completely normal, you will receive your results only by: [place] (if you have MyChart) OR A paper copy in the mail If you have any lab test that is abnormal or we need to change your treatment, we will call you to review the results.   Testing/Procedures: NONE  Follow-Up: At [facility], you and your health needs are our priority.  As part of our continuing mission to provide you with exceptional heart care, we have created designated Provider Care Teams.  These Care Teams include your primary Cardiologist (physician) and Advanced Practice Providers (APPs -  Physician Assistants and Nurse Practitioners) who all work together to provide you with the care you need, when you need it.  Your next appointment:   6 month(s)  The format for your next appointment:   In Person  Provider:   Tressia Miners  Radford Pax, MD       Important Information About Sugar         Signed, Sherren Mocha, MD  04/02/2022 5:20 PM    [place]

## 2022-04-02 NOTE — Patient Instructions (Signed)
Medication Instructions:  Your physician recommends that you continue on your current medications as directed. Please refer to the Current Medication list given to you today.  *If you need a refill on your cardiac medications before your next appointment, please call your pharmacy*   Lab Work: NONE If you have labs (blood work) drawn today and your tests are completely normal, you will receive your results only by: [place] (if you have MyChart) OR A paper copy in the mail If you have any lab test that is abnormal or we need to change your treatment, we will call you to review the results.   Testing/Procedures: NONE  Follow-Up: At [facility], you and your health needs are our priority.  As part of our continuing mission to provide you with exceptional heart care, we have created designated Provider Care Teams.  These Care Teams include your primary Cardiologist (physician) and Advanced Practice Providers (APPs -  Physician Assistants and Nurse Practitioners) who all work together to provide you with the care you need, when you need it.  Your next appointment:   6 month(s)  The format for your next appointment:   In Person  Provider:   Fransico Him, MD       Important Information About Sugar

## 2022-04-04 ENCOUNTER — Telehealth: Payer: Self-pay

## 2022-04-04 NOTE — Telephone Encounter (Signed)
I spoke with the patient wife and agreed to call her tomorrow to help her send missed ICM transmission.

## 2022-04-05 NOTE — Telephone Encounter (Signed)
Pt wife asked me to call on Monday to try and get the missed ICM transmission.

## 2022-04-08 ENCOUNTER — Encounter: Payer: Self-pay | Admitting: Family Medicine

## 2022-04-08 ENCOUNTER — Ambulatory Visit: Payer: Medicare PPO | Admitting: Family Medicine

## 2022-04-08 VITALS — BP 100/59 | HR 64 | Temp 97.9°F | Ht 66.0 in | Wt 169.4 lb

## 2022-04-08 DIAGNOSIS — I509 Heart failure, unspecified: Secondary | ICD-10-CM

## 2022-04-08 MED ORDER — FLUOXETINE HCL 20 MG PO CAPS: 20.0000 mg | ORAL_CAPSULE | Freq: Every day | ORAL | 3 refills | Status: DC

## 2022-04-08 NOTE — Telephone Encounter (Signed)
Transmission received. 04/08/2022.

## 2022-04-08 NOTE — Progress Notes (Unsigned)
OFFICE VISIT  04/09/2022  CC: Chronic illness follow-up  Patient is a 76 y.o. male who presents for 73-monthfollow-up hypertension, hyperlipidemia, history of habitual alcohol use and anger control difficulties. A/P as of last visit: "#1 anger control problems. This has been lifelong. Prozac 20 mg daily worked in the past. Restart Prozac 20 mg a day.   #2 memory impairment. 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  Followed by neurology.  Donepezil was started by neurology.   #3 hyperlipidemia: Atorvastatin 40 mg a day. LDL was 53 about 6 months ago. Fasting lipid panel and hepatic panel today.   #4 habitual alcohol use. He has cut back on this but hard to tell exactly how much. Start thiamine 1517mg a day and folic acid 0.8 mg a day Checking thiamine and folate levels today   #5 vitamin B12 deficiency. Unclear whether or not he is taking any supplement now. Vitamin B12 level today.   #6 Health maintenance exam: Reviewed age and gender appropriate health maintenance issues (prudent diet, regular exercise, health risks of tobacco and excessive alcohol, use of seatbelts, fire alarms in home, use of sunscreen).  Also reviewed age and gender appropriate health screening as well as vaccine recommendations. Vaccines: ALL UTD. Labs: cbc,flp,cmet. Prostate ca screening: shared decision making process utilized today->no further screening indicated due to age. Colon ca screening: adenoma 2014, is overdue for rpt colonoscopy-->pt to contact GI to arrange.   #7 cardiomyopathy, aortic stenosis, mitral insufficiency, aortic root dilatation. He is followed closely by cardiology. Continue Jardiance, Entresto, spironolactone, metoprolol, and aspirin."  INTERIM HX: Seems to be doing well on Prozac 20 mg a day. Short-term memory problems continue. Followed by neurology, continuing Aricept 10 mg a day.  Cardiology following closely for his aortic insufficiency.  He denies any functional  limitations.  ROS as above, plus--> no fevers, no CP, no SOB, no wheezing, no cough, no dizziness, no HAs, no rashes, no melena/hematochezia.  No polyuria or polydipsia.  No myalgias or arthralgias.  No focal weakness, paresthesias, or tremors.  No acute vision or hearing abnormalities.  No dysuria or unusual/new urinary urgency or frequency.  No recent changes in lower legs. No n/v/d or abd pain.  No palpitations.    Past Medical History:  Diagnosis Date   Amnestic MCI (mild cognitive impairment with memory loss) 08/09/2021   Aortic insufficiency 10/21/2018   Mild-mod on echo 07/2017.  Moderate 08/2018.  Moderate-severe on echo 04/2019--> TEE 11/15/19 EF 50-55%, no LV dilation or WMA. Mod AV sclerosis w/out stenosis, mod-to-sev AI. 11/2020 moderate AI. severe eccentric by echo 12/2021.  Severe on echo 12/2021->cardiac MRI confirmed   Aortic root dilatation 10/2018   11/17/19 45 mm aortic root at the sinuses of Valsalva-> 52022 echo->40 mm, unchanged on echo 12/2021   Aortic stenosis, mild    mild by echo 12/2021   Arthritis    L-Spine   Cardiac murmur    Systolic and diastolic as of 04/173   Cardiomyopathy 10/12/2010   Global hypokinesis, EF 40-45% by cath and echo (2012 echo and 2019 echo).  Feb 2020 EF 20-25%. - mixed ICM/NICM (alcohol and CAD)  October 2020 EF 25-30%. 11/2020 EF 40-45%. 06/2021 EF 45-50%,dil LV+hypokinesis,mod AI,grd I DD.   Chronic systolic (congestive) heart failure ([facility] 06/01/2019   max med mgmt, plus CRT-D placed 05/2019, EF improved to 50% after. Cardiac MRI 03/2022 EF 40%   Coronary artery disease 11/16/2010   a. 2012 - 80% OM lesion. b. Cath 12/2018 - showing 95%  prox RCA, 75% OM1, 50% prox-mid LAD. Unsuccessful PCI to 100% chronically occluded RCA summer 2020. CT angio chest w/aorta 08/2021->severe coronary calc's, no aneurism. Mild subclav stenosis d/t athero   Diverticulosis    Erectile dysfunction 08/16/2011   Essential hypertension 07/19/2010   GAD (generalized anxiety  disorder)    managed by Dr. Toy Care with prozac 54m qd   Gout    Usually Right great toe; colchicine helps well   Gouty arthritis of toe of left foot 05/27/2013   Habitual alcohol use    Hx of colonoscopy 2004; 2014   Initial was normal; 2014-polypectomy and diverticulosis.  Recall 2019.   Hyperlipidemia 10/23/2012   LBBB (left bundle branch block)    CRT-D placed 05/2019   Low back strain 10/24/2013   Mild neurocognitive disorder    Neurocognitive testing 2023.  Question early stage Alzheimer's   Obesity, Class I, BMI 30-34.9    Pulmonary nodule    0.5 cm RLL subpleural nodule.  Pt NEVER SMOKER.  Low risk->no f/u imaging is needed.   Subclavian artery stenosis, left (HCC)    mild, detected on CT angio chest w/aorta 08/2021   Thyroid nodule 04/23/2013    Past Surgical History:  Procedure Laterality Date   BIV ICD INSERTION CRT-D N/A 06/01/2019   Procedure: BIV ICD INSERTION CRT-D;  Surgeon: AThompson Grayer MD;  [facility];  Service: Cardiovascular;  Laterality: N/A;   CARDIAC CATHETERIZATION  10/12/2010   diagnostic only: 1 vessel CAD with TIMI 3 flow, EF 40-45%.  Mild AS.  Medical mgmt of CAD.   COLONOSCOPY     COLONOSCOPY W/ POLYPECTOMY  10/06/2012   Diverticulosis and tubular adenoma w/out high grade dysplasia.  Repeat 5 yrs (Dr. KDeatra Ina.   CORONARY ATHERECTOMY N/A 01/08/2019   100% chronic RCA occlusion impossible to clear.  Med mgmt. Procedure: CORONARY ATHERECTOMY;  Surgeon: CSherren Mocha MD;  [facility];  Service: Cardiovascular;  Laterality: N/A;   POLYPECTOMY     RIGHT HEART CATH AND CORONARY ANGIOGRAPHY N/A 12/24/2018   95% RCA occlusion, 75% obtuse marginal occlusion (3V dz). Procedure: RIGHT HEART CATH AND CORONARY ANGIOGRAPHY;  Surgeon: BLorretta Harp MD;  [facility];  Service: Cardiovascular;  Laterality: N/A;   TEE WITHOUT CARDIOVERSION N/A 11/15/2019   EF 50-55%, no LV dilation or WMA. Mod AV sclerosis w/out  stenosis, mod-to-sev AI.  Procedure: TRANSESOPHAGEAL ECHOCARDIOGRAM (TEE);  Surgeon: RFay Records MD;  [facility];  Service: Cardiovascular;  Laterality: N/A;   TRANSTHORACIC ECHOCARDIOGRAM  09/2010; 07/2017; 08/2018; 10/2019;05/2020   2012:  EF 40-45%, global hypokinesis, mild AS.  07/2017: no chg except mild/mod AR.  08/2018 EF 20-25%, global LV hypok, DD, biatrial enlargemt, mod AR, aortic root 3.8 cm. 10/2019 EF 50%, mild dec RV fxn, mod-sev AR, aor root dil 42 mm. 05/2020 EF 45-50%,grd I DD, mod AI, aorta 358m severe LAE. 10/2020 EF 45-50%, mild LVH, global hypokin, grd I DD, mod AI. Echo 12/2021 EF 45-50%, severe AR    Outpatient Medications Prior to Visit  Medication Sig Dispense Refill   aspirin EC 81 MG tablet Take 81 mg by mouth daily. Swallow whole.     atorvastatin (LIPITOR) 40 MG tablet TAKE 1 TABLET DAILY 90 tablet 3   donepezil (ARICEPT) 10 MG tablet Take 1 tablet 10 mg daily 30 tablet 11   empagliflozin (JARDIANCE) 10 MG TABS tablet Take 1 tablet (10 mg total) by mouth daily before breakfast. 90 tablet 3  folic acid (FOLVITE) 1 MG tablet Take 1 mg by mouth daily.     metoprolol succinate (TOPROL-XL) 100 MG 24 hr tablet TAKE ONE TABLET BY MOUTH DAILY WITH A MEAL 90 tablet 1   sacubitril-valsartan (ENTRESTO) 97-103 MG Take 1 tablet by mouth 2 (two) times daily. 90 tablet 3   spironolactone (ALDACTONE) 25 MG tablet TAKE ONE TABLET BY MOUTH DAILY 90 tablet 1   thiamine (VITAMIN B-1) 100 MG tablet Take 1 tablet (100 mg total) by mouth daily. 90 tablet 3   FLUoxetine (PROZAC) 20 MG capsule Take 1 capsule (20 mg total) by mouth daily. 90 capsule 1   No facility-administered medications prior to visit.    Allergies  Allergen Reactions   Penicillins Swelling    Did it involve swelling of the face/tongue/throat, SOB, or low BP? No Did it involve sudden or severe rash/hives, skin peeling, or any reaction on the inside of your mouth or nose? No Did you need to seek medical  attention at a hospital or doctor's office? No When did it last happen? ~10 years ago If all above answers are "NO", may proceed with cephalosporin use.     ROS As per HPI  PE:    04/08/2022    2:03 PM 04/02/2022    1:53 PM 03/07/2022    9:24 AM  Vitals with BMI  Height _0  _1  5' 7.5"  Weight 169 lbs 6 oz 168 lbs 3 oz 166 lbs  BMI 27.35 32.54 98.2  Systolic 641 583 96  Diastolic 59 70 52  Pulse 64 63 61   Physical Exam  Gen: Alert, well appearing.  Patient is oriented to person, place, time, and situation. AFFECT: pleasant, lucid thought and speech. CV: RRR, 1/6 syst murmur and 1/6 diast murmur.  No r/g.   LUNGS: CTA bilat, nonlabored resps, good aeration in all lung fields. EXT: no clubbing or cyanosis.  no edema.   LABS:  Last CBC Lab Results  Component Value Date   WBC 4.2 09/29/2020   HGB 13.7 09/29/2020   HCT 39.7 09/29/2020   MCV 93.2 09/29/2020   MCH WILL FOLLOW 11/12/2019   MCH 32.5 11/12/2019   RDW 14.1 09/29/2020   PLT 198.0 09/40/7680   Last metabolic panel Lab Results  Component Value Date   GLUCOSE 87 10/30/2021   NA 141 10/30/2021   K 3.7 10/30/2021   CL 106 10/30/2021   CO2 21 10/30/2021   BUN 10 10/30/2021   CREATININE 0.86 10/30/2021   EGFR 90 10/30/2021   CALCIUM 8.5 (L) 10/30/2021   PROT 6.0 10/04/2021   ALBUMIN 4.2 10/04/2021   LABGLOB 1.9 04/26/2019   AGRATIO 2.2 04/26/2019   BILITOT 0.9 10/04/2021   ALKPHOS 65 10/04/2021   AST 14 10/04/2021   ALT 10 10/04/2021   ANIONGAP 9 06/02/2019   Last lipids Lab Results  Component Value Date   CHOL 127 10/04/2021   HDL 61.30 10/04/2021   LDLCALC 57 10/04/2021   TRIG 42.0 10/04/2021   CHOLHDL 2 10/04/2021   Last thyroid functions Lab Results  Component Value Date   TSH 3.45 04/12/2021   Last vitamin B12 and Folate Lab Results  Component Value Date   VITAMINB12 315 10/04/2021   FOLATE 12.3 10/04/2021   IMPRESSION AND PLAN:  No problem-specific Assessment & Plan notes  found for this encounter.  #1 anger control problems. Seems to be doing better with Prozac 20 mg a day.  Continue this.  #2 memory impairment.  Question vascular plus contribution by alcohol. Question Alzheimer's.   Followed by neurology.  Hard to tell if any improvement with donepezil.  #3 hyperlipidemia: Atorvastatin 40 mg a day. LDL was 57 about 6 months ago. Not fasting today. Plan lipid panel 6 months.   Hepatic panel today.   #4 habitual alcohol use. He has cut back on this but hard to tell exactly how much. He is on thiamine and folic acid.  #5 severe aortic valve insufficiency and mildly dilated aortic root. He has chronic systolic heart failure. Asymptomatic. Cardiology note dated 04/02/2022 reviewed, ongoing medical management: Aspirin 81 mg a day, atorvastatin 40 mg a day, Jardiance 10 mg a day, Toprol XL 100 mg a day, Entresto 97-103 1 twice daily, and spironolactone 25 mg a day. CBC, electrolytes, and creatinine today.  An After Visit Summary was printed and given to the patient.  FOLLOW UP: Return in about 6 months (around 10/07/2022) for annual CPE (fasting).  Signed:  Crissie Sickles, MD           04/09/2022

## 2022-04-09 ENCOUNTER — Ambulatory Visit (INDEPENDENT_AMBULATORY_CARE_PROVIDER_SITE_OTHER): Payer: Medicare PPO

## 2022-04-09 DIAGNOSIS — I5022 Chronic systolic (congestive) heart failure: Secondary | ICD-10-CM | POA: Diagnosis not present

## 2022-04-09 DIAGNOSIS — Z95810 Presence of automatic (implantable) cardiac defibrillator: Secondary | ICD-10-CM

## 2022-04-09 LAB — CUP PACEART REMOTE DEVICE CHECK
Battery Remaining Longevity: 62 mo
Battery Remaining Percentage: 66 %
Battery Voltage: 2.95 V
Brady Statistic AP VP Percent: 93 %
Brady Statistic AP VS Percent: 1 %
Brady Statistic AS VP Percent: 7 %
Brady Statistic AS VS Percent: 1 %
Brady Statistic RA Percent Paced: 93 %
Date Time Interrogation Session: 20230926023042
HighPow Impedance: 68 Ohm
Implantable Lead Implant Date: 20201117
Implantable Lead Implant Date: 20201117
Implantable Lead Implant Date: 20201117
Implantable Lead Location: 753858
Implantable Lead Location: 753859
Implantable Lead Location: 753860
Implantable Pulse Generator Implant Date: 20201117
Lead Channel Impedance Value: 390 Ohm
Lead Channel Impedance Value: 410 Ohm
Lead Channel Impedance Value: 660 Ohm
Lead Channel Pacing Threshold Amplitude: 0.75 V
Lead Channel Pacing Threshold Amplitude: 0.75 V
Lead Channel Pacing Threshold Amplitude: 1.125 V
Lead Channel Pacing Threshold Pulse Width: 0.5 ms
Lead Channel Pacing Threshold Pulse Width: 0.5 ms
Lead Channel Pacing Threshold Pulse Width: 0.5 ms
Lead Channel Sensing Intrinsic Amplitude: 0.9 mV
Lead Channel Sensing Intrinsic Amplitude: 11.8 mV
Lead Channel Setting Pacing Amplitude: 1.625
Lead Channel Setting Pacing Amplitude: 1.75 V
Lead Channel Setting Pacing Amplitude: 2 V
Lead Channel Setting Pacing Pulse Width: 0.5 ms
Lead Channel Setting Pacing Pulse Width: 0.5 ms
Lead Channel Setting Sensing Sensitivity: 0.5 mV
Pulse Gen Serial Number: 111012743

## 2022-04-09 LAB — CBC
HCT: 41.8 % (ref 39.0–52.0)
Hemoglobin: 14.3 g/dL (ref 13.0–17.0)
MCHC: 34.1 g/dL (ref 30.0–36.0)
MCV: 92.9 fl (ref 78.0–100.0)
Platelets: 181 10*3/uL (ref 150.0–400.0)
RBC: 4.49 Mil/uL (ref 4.22–5.81)
RDW: 14.4 % (ref 11.5–15.5)
WBC: 5.1 10*3/uL (ref 4.0–10.5)

## 2022-04-09 LAB — COMPREHENSIVE METABOLIC PANEL
ALT: 11 U/L (ref 0–53)
AST: 11 U/L (ref 0–37)
Albumin: 4.2 g/dL (ref 3.5–5.2)
Alkaline Phosphatase: 75 U/L (ref 39–117)
BUN: 12 mg/dL (ref 6–23)
CO2: 28 mEq/L (ref 19–32)
Calcium: 8.6 mg/dL (ref 8.4–10.5)
Chloride: 107 mEq/L (ref 96–112)
Creatinine, Ser: 0.87 mg/dL (ref 0.40–1.50)
GFR: 84.35 mL/min (ref >60.00)
Glucose, Bld: 91 mg/dL (ref 70–99)
Potassium: 3.4 mEq/L — ABNORMAL LOW (ref 3.5–5.1)
Sodium: 140 mEq/L (ref 135–145)
Total Bilirubin: 1 mg/dL (ref 0.2–1.2)
Total Protein: 6.3 g/dL (ref 6.0–8.3)

## 2022-04-10 ENCOUNTER — Telehealth: Payer: Self-pay

## 2022-04-10 DIAGNOSIS — E876 Hypokalemia: Secondary | ICD-10-CM

## 2022-04-10 MED ORDER — POTASSIUM CHLORIDE CRYS ER 20 MEQ PO TBCR: 20.0000 meq | EXTENDED_RELEASE_TABLET | Freq: Every day | ORAL | 0 refills | Status: DC

## 2022-04-10 NOTE — Progress Notes (Signed)
EPIC Encounter for ICM Monitoring  Patient Name: Anthony Daugherty is a 76 y.o. male Date: 04/10/2022 Primary Care Physican: Tammi Sou, MD Primary Cardiologist: Radford Pax Electrophysiologist: Allred Bi-V Pacing: 99%         11/14/2021 Office Weight: 160 lbs   AT/AF Burden: 0%                                                            Spoke with wife Thayer Headings and heart failure questions reviewed.  Transmission results reviewed.  She reports patient is feeling well and not reporting any fluid symptoms.  Doctors office visit last week and no fluid on exam.    CorVue thoracic impedance suggesting possible fluid accumulation starting 9/24 and returned close to baseline normal.  Impedance also suggesting possible fluid accumulation from 8/31-9/8.   Prescribed: Spironolactone 25 mg take 0.5 tablet daily.   Labs: 10/30/2021 Creatinine 0.86, BUN 10, Potassium 3.7, Sodium 141, GFR 90 10/23/2021 Creatinine 0.98, BUN 11, Potassium 3.1, Sodium 142, GFR 80  10/04/2021 Creatinine 0.87, BUN 14, Potassium 3.7, Sodium 140  09/04/2021 Creatinine 0.96, BUN 26, Potassium 4.2, Sodium 140, GFR 82 A complete set of results can be found in Results Review.   Recommendations:  Left voice mail with ICM number and encouraged to call if experiencing any fluid symptoms.    Follow-up plan: ICM clinic phone appointment on 05/13/2022.   91 day device clinic remote transmission 07/09/2022.   EP/Cardiology Office Visits:  Recall 01/02/2023 with EP.  09/04/2022 with Dr Radford Pax.   Copy of ICM check sent to Dr. Curt Bears.   3 month ICM trend: 04/09/2022.    12-14 Month ICM trend:     Rosalene Billings, RN 04/10/2022 2:09 PM

## 2022-04-10 NOTE — Addendum Note (Signed)
Addended by: Deveron Furlong D on: 04/10/2022 11:47 AM   Modules accepted: Orders

## 2022-04-10 NOTE — Telephone Encounter (Signed)
-----   Message from Tammi Sou, MD sent at 04/10/2022 10:14 AM EDT ----- Potassium is mildly low.  I recommend he start a potassium supplement and come for nonfasting lab visit in 7 to 10 days for basic metabolic panel and magnesium level, diagnosis hypokalemia. Please prescribe Klor-Con 20 mEQ, 1 tab daily, #10, no refill.  All other labs normal.

## 2022-04-11 ENCOUNTER — Other Ambulatory Visit: Payer: Self-pay | Admitting: Family Medicine

## 2022-04-22 ENCOUNTER — Other Ambulatory Visit (INDEPENDENT_AMBULATORY_CARE_PROVIDER_SITE_OTHER): Payer: Medicare PPO

## 2022-04-22 DIAGNOSIS — E876 Hypokalemia: Secondary | ICD-10-CM

## 2022-04-22 LAB — BASIC METABOLIC PANEL
BUN: 13 mg/dL (ref 6–23)
CO2: 27 mEq/L (ref 19–32)
Calcium: 9 mg/dL (ref 8.4–10.5)
Chloride: 107 mEq/L (ref 96–112)
Creatinine, Ser: 0.91 mg/dL (ref 0.40–1.50)
GFR: 82.37 mL/min (ref >60.00)
Glucose, Bld: 84 mg/dL (ref 70–99)
Potassium: 4 mEq/L (ref 3.5–5.1)
Sodium: 141 mEq/L (ref 135–145)

## 2022-04-22 LAB — MAGNESIUM: Magnesium: 2.1 mg/dL (ref 1.5–2.5)

## 2022-04-23 MED ORDER — POTASSIUM CHLORIDE CRYS ER 20 MEQ PO TBCR: 20.0000 meq | EXTENDED_RELEASE_TABLET | Freq: Every day | ORAL | 1 refills | Status: DC

## 2022-05-15 NOTE — Progress Notes (Signed)
No ICM remote transmission received for 05/13/2022 and next ICM transmission scheduled for 06/17/2022.

## 2022-06-17 ENCOUNTER — Ambulatory Visit (INDEPENDENT_AMBULATORY_CARE_PROVIDER_SITE_OTHER): Payer: Medicare PPO

## 2022-06-17 ENCOUNTER — Other Ambulatory Visit: Payer: Self-pay | Admitting: Cardiology

## 2022-06-17 DIAGNOSIS — I5022 Chronic systolic (congestive) heart failure: Secondary | ICD-10-CM | POA: Diagnosis not present

## 2022-06-17 DIAGNOSIS — Z95810 Presence of automatic (implantable) cardiac defibrillator: Secondary | ICD-10-CM | POA: Diagnosis not present

## 2022-06-19 ENCOUNTER — Encounter: Payer: Self-pay | Admitting: Physician Assistant

## 2022-06-19 ENCOUNTER — Ambulatory Visit: Payer: Medicare PPO | Admitting: Physician Assistant

## 2022-06-19 VITALS — BP 123/67 | HR 64 | Resp 18 | Ht 66.0 in | Wt 173.0 lb

## 2022-06-19 DIAGNOSIS — R413 Other amnesia: Secondary | ICD-10-CM

## 2022-06-19 DIAGNOSIS — G3184 Mild cognitive impairment, so stated: Secondary | ICD-10-CM | POA: Diagnosis not present

## 2022-06-19 MED ORDER — DONEPEZIL HCL 10 MG PO TABS: ORAL_TABLET | ORAL | 11 refills | Status: DC

## 2022-06-19 NOTE — Patient Instructions (Signed)
It was a pleasure to see you today at our office.   Recommendations:  Follow up in 6  months Continue taking donepezil 10 mg daily  Continue taking ASA baby and statin  Continue control of mood with primary doctor   Whom to call:  Memory  decline, memory medications: Call our office [phone]   For psychiatric meds, mood meds: Please have your primary care physician manage these medications.   Counseling regarding caregiver distress, including caregiver depression, anxiety and issues regarding community resources, adult day care programs, adult living facilities, or memory care questions:   Feel free to contact [place], Social Worker at [phone]   For assessment of decision of mental capacity and competency:  Call Dr. Anthoney Harada, geriatric psychiatrist at [phone]  For guidance in geriatric dementia issues please call [facility] [phone]  For guidance regarding [facility] and if placement were needed at the facility, contact Arnell Asal, Social Worker tel: [phone]  If you have any severe symptoms of a stroke, or other severe issues such as confusion,severe chills or fever, etc call 911 or go to the ER as you may need to be evaluated further   Feel free to visit Facebook page " [email or web address]" for tips of how to care for people with memory problems.         RECOMMENDATIONS FOR ALL PATIENTS WITH MEMORY PROBLEMS: 1. Continue to exercise (Recommend 30 minutes of walking everyday, or 3 hours every week) 2. Increase social interactions - continue going to [place] and enjoy social gatherings with friends and family 3. Eat healthy, avoid fried foods and eat more fruits and vegetables 4. Maintain adequate blood pressure, blood sugar, and blood cholesterol level. Reducing the risk of stroke and cardiovascular disease also helps promoting better memory. 5. Avoid stressful situations. Live a simple life and avoid aggravations.  Organize your time and prepare for the next day in anticipation. 6. Sleep well, avoid any interruptions of sleep and avoid any distractions in the bedroom that may interfere with adequate sleep quality 7. Avoid sugar, avoid sweets as there is a strong link between excessive sugar intake, diabetes, and cognitive impairment We discussed the Mediterranean diet, which has been shown to help patients reduce the risk of progressive memory disorders and reduces cardiovascular risk. This includes eating fish, eat fruits and green leafy vegetables, nuts like almonds and hazelnuts, walnuts, and also use olive oil. Avoid fast foods and fried foods as much as possible. Avoid sweets and sugar as sugar use has been linked to worsening of memory function.  There is always a concern of gradual progression of memory problems. If this is the case, then we may need to adjust level of care according to patient needs. Support, both to the patient and caregiver, should then be put into place.    FALL PRECAUTIONS: Be cautious when walking. Scan the area for obstacles that may increase the risk of trips and falls. When getting up in the mornings, sit up at the edge of the bed for a few minutes before getting out of bed. Consider elevating the bed at the head end to avoid drop of blood pressure when getting up. Walk always in a well-lit room (use night lights in the walls). Avoid area rugs or power cords from appliances in the middle of the walkways. Use a walker or a cane if necessary and consider physical therapy for balance exercise. Get your eyesight checked regularly.  FINANCIAL OVERSIGHT: Supervision, especially oversight  when making financial decisions or transactions is also recommended.  HOME SAFETY: Consider the safety of the kitchen when operating appliances like stoves, microwave oven, and blender. Consider having supervision and share cooking responsibilities until no longer able to participate in those. Accidents  with firearms and other hazards in the house should be identified and addressed as well.   ABILITY TO BE LEFT ALONE: If patient is unable to contact 911 operator, consider using LifeLine, or when the need is there, arrange for someone to stay with patients. Smoking is a fire hazard, consider supervision or cessation. Risk of wandering should be assessed by caregiver and if detected at any point, supervision and safe proof recommendations should be instituted.  MEDICATION SUPERVISION: Inability to self-administer medication needs to be constantly addressed. Implement a mechanism to ensure safe administration of the medications.   DRIVING: Regarding driving, in patients with progressive memory problems, driving will be impaired. We advise to have someone else do the driving if trouble finding directions or if minor accidents are reported. Independent driving assessment is available to determine safety of driving.   If you are interested in the driving assessment, you can contact the following:  [employer] in [place]  [place] [phone]  [place]  [facility] [phone] or [phone]

## 2022-06-19 NOTE — Progress Notes (Signed)
Assessment/Plan:   Mild Cognitive Impairment, amnestic, of unclear etiology History of alcohol abuse  [facility] Daugherty is a very pleasant 76 y.o. RH male presenting today in follow-up for evaluation of memory loss. Patient is on donepezil 10 mg daily, tolerating well. Mood is better controlled, he is stable from the cognitive status.  No new signs or symptoms of withdrawal.  He is compliant with the medications.  He continues to drink 1 beer a day think quitting "    Recommendations:   Follow up in 6 months. Continue donepezil 10 mg daily Continue to abstain from alcohol  Continue B1 replenishment Repeat neurocognitive testing for clarity of diagnosis and disease progression  Continue to monitor cardiovascular risk factors Continue to control mood as per PCP Repeat neurocognitive testing around January 2025   Abnormal MRI findings on personally reviewed MRI brain 11/12/21 : A 6 mm acute/early subacute infarct within the right caudate nucleus. Mild chronic small vessel ischemic changes within the cerebral white matter. Subcentimeter chronic infarct within the inferior right cerebellar hemisphere. He is asymptomatic for stroke symptoms.  Lipid panel on 10/04/21 is normal.  Carotid ultrasound showed mild left ICA stenosis.  He is on aspirin 81 mg daily on 11/14/2021, he is also on statin. Continue baby aspirin daily Continue statin daily Continue to monitor cardiovascular risk factors, goal LDL less than 70 Continue alcohol cessation Follow-up with cardiology    Subjective:   This patient is accompanied in the office by his wife  who supplements the history. Previous records as well as any outside records available were reviewed prior to todays visit.  He was last seen on 01/02/2022 at which time MMSE was 29/30    Any changes in memory since last visit? He still has difficulty remembering appointments or getting his thoughts organized, following through his tasks. HE had a  power  outage, could not remember where the power box was, despite being in that house for many years repeats oneself?Intermittently he may ask "who am I seeing, is it male or male, when I seen him for "etc. Disoriented when walking into a room?  Patient denies   Leaving objects in unusual places?  Patient denies   Ambulates  with difficulty?   Patient denies   Recent falls?  Patient denies   Any head injuries?  Patient denies   History of seizures?   Patient denies   Wandering behavior?  Patient denies   Patient drives? No issues, drives locally   Any mood changes since last visit? Improvement, less anxious and more"complaisant""he is much much better since taking Prozac "-wife says Any worsening depression?:  Patient denies   Hallucinations?  Patient denies   Paranoia?  Patient denies   Reports that he never sleeps very well, likes to watch detective shelves at night until falling asleep.  Denies vivid dreams, REM behavior or sleepwalking*  History of sleep apnea?  Patient denies   Any hygiene concerns?  Patient denies   Independent of bathing and dressing?  Endorsed  Does the patient needs help with medications? Wife is In charge  Who is in charge of the finances? Wife  is in charge    Any changes in appetite?  Patient denies   "not as much as he used to" Patient have trouble swallowing? Patient denies   Does the patient cook?  Patient denies   Any kitchen accidents such as leaving the stove on? Patient denies   Any headaches?  Patient denies   Double  vision? Patient denies   Any focal numbness or tingling?  Patient denies   Chronic back pain Patient denies   Unilateral weakness?  Patient denies   Any tremors?  Patient denies   Any history of anosmia?  Patient denies   Any incontinence of urine?  Patient denies   Any bowel dysfunction?   Patient denies      Patient lives with his wife He continues to chew tobacco and has no intentions of quitting  Past Medical History:  Diagnosis  Date   Amnestic MCI (mild cognitive impairment with memory loss) 08/09/2021   Aortic insufficiency 10/21/2018   Mild-mod on echo 07/2017.  Moderate 08/2018.  Moderate-severe on echo 04/2019--> TEE 11/15/19 EF 50-55%, no LV dilation or WMA. Mod AV sclerosis w/out stenosis, mod-to-sev AI. 11/2020 moderate AI. severe eccentric by echo 12/2021.  Severe on echo 12/2021->cardiac MRI confirmed   Aortic root dilatation 10/2018   11/17/19 45 mm aortic root at the sinuses of Valsalva-> 52022 echo->40 mm, unchanged on echo 12/2021   Aortic stenosis, mild    mild by echo 12/2021   Arthritis    L-Spine   Cardiac murmur    Systolic and diastolic as of 09/6142.   Cardiomyopathy 10/12/2010   Global hypokinesis, EF 40-45% by cath and echo (2012 echo and 2019 echo).  Feb 2020 EF 20-25%. - mixed ICM/NICM (alcohol and CAD)  October 2020 EF 25-30%. 11/2020 EF 40-45%. 06/2021 EF 45-50%,dil LV+hypokinesis,mod AI,grd I DD.   Chronic systolic (congestive) heart failure ([place]) 06/01/2019   max med mgmt, plus CRT-D placed 05/2019, EF improved to 50% after. Cardiac MRI 03/2022 EF 40%   Coronary artery disease 11/16/2010   a. 2012 - 80% OM lesion. b. Cath 12/2018 - showing 95% prox RCA, 75% OM1, 50% prox-mid LAD. Unsuccessful PCI to 100% chronically occluded RCA summer 2020. CT angio chest w/aorta 08/2021->severe coronary calc's, no aneurism. Mild subclav stenosis d/t athero   Diverticulosis    Erectile dysfunction 08/16/2011   Essential hypertension 07/19/2010   GAD (generalized anxiety disorder)    managed by Dr. Toy Care with prozac '20mg'$  qd   Gout    Usually Right great toe; colchicine helps well   Gouty arthritis of toe of left foot 05/27/2013   Habitual alcohol use    Hx of colonoscopy 2004; 2014   Initial was normal; 2014-polypectomy and diverticulosis.  Recall 2019.   Hyperlipidemia 10/23/2012   LBBB (left bundle branch block)    CRT-D placed 05/2019   Low back strain 10/24/2013   Mild neurocognitive disorder     Neurocognitive testing 2023.  Question early stage Alzheimer's   Obesity, Class I, BMI 30-34.9    Pulmonary nodule    0.5 cm RLL subpleural nodule.  Pt NEVER SMOKER.  Low risk->no f/u imaging is needed.   Subclavian artery stenosis, left ([facility])    mild, detected on CT angio chest w/aorta 08/2021   Thyroid nodule 04/23/2013     Past Surgical History:  Procedure Laterality Date   BIV ICD INSERTION CRT-D N/A 06/01/2019   Procedure: BIV ICD INSERTION CRT-D;  Surgeon: [name], MD;  Location: [facility];  Service: Cardiovascular;  Laterality: N/A;   CARDIAC CATHETERIZATION  10/12/2010   diagnostic only: 1 vessel CAD with TIMI 3 flow, EF 40-45%.  Mild AS.  Medical mgmt of CAD.   COLONOSCOPY     COLONOSCOPY W/ POLYPECTOMY  10/06/2012   Diverticulosis and tubular adenoma w/out high grade dysplasia.  Repeat 5 yrs (Dr.  Deatra Ina).   CORONARY ATHERECTOMY N/A 01/08/2019   100% chronic RCA occlusion impossible to clear.  Med mgmt. Procedure: CORONARY ATHERECTOMY;  Surgeon: Sherren Mocha, MD;  Location: [facility];  Service: Cardiovascular;  Laterality: N/A;   POLYPECTOMY     RIGHT HEART CATH AND CORONARY ANGIOGRAPHY N/A 12/24/2018   95% RCA occlusion, 75% obtuse marginal occlusion (3V dz). Procedure: RIGHT HEART CATH AND CORONARY ANGIOGRAPHY;  Surgeon: Lorretta Harp, MD;  Location: [facility];  Service: Cardiovascular;  Laterality: N/A;   TEE WITHOUT CARDIOVERSION N/A 11/15/2019   EF 50-55%, no LV dilation or WMA. Mod AV sclerosis w/out stenosis, mod-to-sev AI.  Procedure: TRANSESOPHAGEAL ECHOCARDIOGRAM (TEE);  Surgeon: Fay Records, MD;  Location: [facility];  Service: Cardiovascular;  Laterality: N/A;   TRANSTHORACIC ECHOCARDIOGRAM  09/2010; 07/2017; 08/2018; 10/2019;05/2020   2012:  EF 40-45%, global hypokinesis, mild AS.  07/2017: no chg except mild/mod AR.  08/2018 EF 20-25%, global LV hypok, DD, biatrial enlargemt, mod AR, aortic root 3.8 cm. 10/2019 EF 50%, mild dec RV  fxn, mod-sev AR, aor root dil 42 mm. 05/2020 EF 45-50%,grd I DD, mod AI, aorta 37m, severe LAE. 10/2020 EF 45-50%, mild LVH, global hypokin, grd I DD, mod AI. Echo 12/2021 EF 45-50%, severe AR     PREVIOUS MEDICATIONS:   CURRENT MEDICATIONS:  Outpatient Encounter Medications as of 06/19/2022  Medication Sig   aspirin EC 81 MG tablet Take 81 mg by mouth daily. Swallow whole.   atorvastatin (LIPITOR) 40 MG tablet TAKE 1 TABLET DAILY   donepezil (ARICEPT) 10 MG tablet Take 1 tablet 10 mg daily   empagliflozin (JARDIANCE) 10 MG TABS tablet Take 1 tablet (10 mg total) by mouth daily before breakfast.   FLUoxetine (PROZAC) 20 MG capsule Take 1 capsule (20 mg total) by mouth daily.   folic acid (FOLVITE) 1 MG tablet Take 1 mg by mouth daily.   metoprolol succinate (TOPROL-XL) 100 MG 24 hr tablet TAKE ONE TABLET BY MOUTH DAILY WITH A MEAL   potassium chloride SA (KLOR-CON M) 20 MEQ tablet Take 1 tablet (20 mEq total) by mouth daily.   sacubitril-valsartan (ENTRESTO) 97-103 MG TAKE 1 TABLET 2 TIMES A DAY   spironolactone (ALDACTONE) 25 MG tablet TAKE ONE TABLET BY MOUTH DAILY   thiamine (VITAMIN B-1) 100 MG tablet Take 1 tablet (100 mg total) by mouth daily.   No facility-administered encounter medications on file as of 06/19/2022.     Objective:     PHYSICAL EXAMINATION:    VITALS:   Vitals:   06/19/22 1254  BP: 123/67  Pulse: 64  Resp: 18  SpO2: 97%  Weight: 173 lb (78.5 kg)  Height: '5\' 6"'$  (1.676 m)    GEN:  The patient appears stated age and is in NAD. HEENT:  Normocephalic, atraumatic.   Neurological examination:  General: NAD, well-groomed, appears stated age. Orientation: The patient is alert. Oriented to person, place and not to date Cranial nerves: There is good facial symmetry.The speech is fluent and clear. No aphasia or dysarthria. Fund of knowledge is appropriate. Recent memory impaired and remote memory is normal.  Attention and concentration are normal.  Able to name  objects and repeat phrases.  Hearing is intact to conversational tone.    Sensation: Sensation is intact to light touch throughout Motor: Strength is at least antigravity x4. Tremors: none  DTR's 2/4 in UE/LE       No data to display  12/13/2021   12:00 PM 08/01/2017    8:57 AM  MMSE - Mini Mental State Exam  Orientation to time 5 5  Orientation to Place 5 5  Registration 3 3  Attention/ Calculation 5 3  Recall 2 3  Language- name 2 objects 2 2  Language- repeat 1 1  Language- follow 3 step command 3 3  Language- read & follow direction 1 1  Write a sentence 1 1  Copy design 1 1  Total score 29 28       Movement examination: Tone: There is normal tone in the UE/LE Abnormal movements:  no tremor.  No myoclonus.  No asterixis.   Coordination:  There is no decremation with RAM's. Normal finger to nose  Gait and Station: The patient has no difficulty arising out of a deep-seated chair without the use of the hands. The patient's stride length is good.  Gait is cautious and narrow.   Thank you for allowing [place] the opportunity to participate in the care of this nice patient. Please do not hesitate to contact us for any questions or concerns.   Total time spent on today's visit was 31 minutes dedicated to this patient today, preparing to see patient, examining the patient, ordering tests and/or medications and counseling the patient, documenting clinical information in the EHR or other health record, independently interpreting results and communicating results to the patient/family, discussing treatment and goals, answering patient's questions and coordinating care.  Cc:  Tammi Sou, MD  Sharene Butters 06/19/2022 1:17 PM

## 2022-06-21 NOTE — Progress Notes (Signed)
EPIC Encounter for ICM Monitoring  Patient Name: [facility] Daugherty is a 76 y.o. male Date: 06/21/2022 Primary Care Physican: Tammi Sou, MD Primary Cardiologist: Radford Pax Electrophysiologist: Cyndi Bender Pacing: 99%         11/14/2021 Office Weight: 160 lbs   AT/AF Burden: 0%                                                            Transmission reviewed.    CorVue thoracic impedance normal but was suggesting possible fluid accumulation during Thanksgiving holiday.   Prescribed: Spironolactone 25 mg take 0.5 tablet daily.   Labs: 10/30/2021 Creatinine 0.86, BUN 10, Potassium 3.7, Sodium 141, GFR 90 10/23/2021 Creatinine 0.98, BUN 11, Potassium 3.1, Sodium 142, GFR 80  10/04/2021 Creatinine 0.87, BUN 14, Potassium 3.7, Sodium 140  09/04/2021 Creatinine 0.96, BUN 26, Potassium 4.2, Sodium 140, GFR 82 A complete set of results can be found in Results Review.   Recommendations:  No changes.   Follow-up plan: ICM clinic phone appointment on 07/29/2022   91 day device clinic remote transmission 07/09/2022.   EP/Cardiology Office Visits:  Recall 01/02/2023 with EP.  09/04/2022 with Dr Radford Pax.   Copy of ICM check sent to Dr. Curt Bears.   3 month ICM trend: 06/14/2022.    Rosalene Billings, RN 06/21/2022 3:19 PM

## 2022-06-28 ENCOUNTER — Ambulatory Visit: Payer: Medicare PPO | Admitting: Psychology

## 2022-06-28 ENCOUNTER — Encounter: Payer: Self-pay | Admitting: Psychology

## 2022-06-28 ENCOUNTER — Ambulatory Visit: Payer: Medicare PPO

## 2022-06-28 DIAGNOSIS — R4189 Other symptoms and signs involving cognitive functions and awareness: Secondary | ICD-10-CM

## 2022-06-28 DIAGNOSIS — I6381 Other cerebral infarction due to occlusion or stenosis of small artery: Secondary | ICD-10-CM | POA: Diagnosis not present

## 2022-06-28 DIAGNOSIS — G3184 Mild cognitive impairment, so stated: Secondary | ICD-10-CM | POA: Diagnosis not present

## 2022-06-28 DIAGNOSIS — I771 Stricture of artery: Secondary | ICD-10-CM | POA: Insufficient documentation

## 2022-06-28 DIAGNOSIS — F1090 Alcohol use, unspecified, uncomplicated: Secondary | ICD-10-CM

## 2022-06-28 NOTE — Progress Notes (Signed)
   Psychometrician Note   Cognitive testing was administered to [facility] Daugherty by Cruzita Lederer, B.S. (psychometrist) under the supervision of Dr. Christia Reading, Ph.D., licensed psychologist on 06/28/2022. Anthony Daugherty did not appear overtly distressed by the testing session per behavioral observation or responses across self-report questionnaires. Rest breaks were offered.    The battery of tests administered was selected by Dr. Christia Reading, Ph.D. with consideration to Anthony Daugherty current level of functioning, the nature of his symptoms, emotional and behavioral responses during interview, level of literacy, observed level of motivation/effort, and the nature of the referral question. This battery was communicated to the psychometrist. Communication between Dr. Christia Reading, Ph.D. and the psychometrist was ongoing throughout the evaluation and Dr. Christia Reading, Ph.D. was immediately accessible at all times. Dr. Christia Reading, Ph.D. provided supervision to the psychometrist on the date of this service to the extent necessary to assure the quality of all services provided.    Anthony Daugherty will return within approximately 1-2 weeks for an interactive feedback session with [facility] Daugherty at which time his test performances, clinical impressions, and treatment recommendations will be reviewed in detail. Anthony Daugherty understands he can contact our office should he require our assistance before this time.  A total of 110 minutes of billable time were spent face-to-face with Anthony Daugherty by the psychometrist. This includes both test administration and scoring time. Billing for these services is reflected in the clinical report generated by Dr. Christia Reading, Ph.D.  This note reflects time spent with the psychometrician and does not include test scores or any clinical interpretations made by Anthony Daugherty. The full report will follow in a separate note.

## 2022-06-28 NOTE — Progress Notes (Signed)
NEUROPSYCHOLOGICAL EVALUATION . [facility]  Date of Evaluation: June 28, 2022  Reason for Referral:   Anthony Daugherty is a 76 y.o. 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   Assessment and Plan:   Clinical Impression(s): Anthony Daugherty pattern of performance is suggestive of a prominent impairment surrounding all aspects of learning and memory. Additional impairments were exhibited across cognitive flexibility and an isolated line orientation task. While a normative impairment was exhibited across a task assessing receptive language, he only missed a few items across this task and this appears to better represent a normative deficit rather than a clinical deficit. Some performance variability was exhibited across processing speed and semantic fluency. Performances were appropriate relative to age-matched peers across attention/concentration, verbal reasoning abilities, safety/judgment, phonemic fluency, confrontation naming, and visuospatial abilities outside of a line orientation task. Anthony Daugherty largely denied difficulties completing instrumental activities of daily living (ADLs) independently. His wife actively monitors medication adherence and noted some navigational decline while driving but was otherwise in agreement with this. As such, given evidence for cognitive dysfunction described above, he continues to meet diagnostic criteria for a Mild Neurocognitive Disorder ("mild cognitive impairment").  Relative to his previous evaluation in January 2023, greatest decline was exhibited across cognitive flexibility and semantic fluency, especially a semantic  fluency based switching task (D-KEFS Verbal Fluency). However, not all of semantic fluency saw decline as some tasks remained both stable and normatively appropriate. A decline was also seen across a task assessing receptive language; however, as stated above, this is not believed to be a true functional decline given he missed so few raw items. Severe memory impairments across the prior evaluation were again exhibited. Where retention percentages ranged from 0% to 14% previously, they now ranged from 0% to 11%. All other domains (representing a majority of the evaluation) represented ongoing stability.  Regarding etiology, concerns remain for underlying Alzheimer's disease. Despite modest evidence to suggest some learning across learning trials of memory tests, he was essentially amnestic across all memory tasks after a very brief delay. He also performed poorly across yes/no recognition tasks. Taken together, this suggests rapid forgetting and an evolving and already quite significant memory storage impairment, both of which are the hallmark characteristics of this illness. Some decline surrounding semantic fluency would follow typical disease progression. It is encouraging that confrontation naming and visuospatial abilities remained stable and largely appropriate. This would suggest that this illness, if indeed present, remains in early stages and seems to progressing slowly.   Outside of Alzheimer's disease, there were prior concerns surrounding an alcohol-related dementia presentation. Fortunately, since his previous evaluation, Anthony Daugherty has greatly diminished alcohol consumption, down from 28 beers per week in September 2022 to perhaps seven beers per week currently. He also has started thiamine supplementation due to mildly low levels. Presently, as alcohol has diminished and thiamine levels regulated, no improvements were seen. I continue to feel greater concern for underlying Alzheimer's disease,  simply made worse by his extensive alcohol history. However, prolonged sobriety will absolutely be beneficial for his physical and neurological health. There could further be a mild vascular contribution given neuroimaging revealing prior lacunar infarcts and some microvascular ischemia. However, this is viewed as contributory and would  not be expected to create rapid forgetting and amnestic memory. Continued medical monitoring will be important moving forward.  Recommendations: A repeat neuropsychological evaluation in 18-24 months (or sooner if functional decline is noted) is recommended to assess the trajectory of future cognitive decline should it occur. This will also aid in future efforts towards improved diagnostic clarity.   Anthony Daugherty has already been prescribed a medication aimed to address memory loss and concerns surrounding Alzheimer's disease (i.e., donepezil/Aricept). He is encouraged to continue taking this medication as prescribed. It is important to highlight that this medication has been shown to slow functional decline in some individuals. There is no current treatment which can stop or reverse cognitive decline when caused by a neurodegenerative illness.   Should there be further progression of current deficits, he is unlikely to regain any independent living skills lost. Therefore, it is recommended that he remain as involved as possible in all aspects of household chores, finances, and medication management, with supervision to ensure adequate performance. He will likely benefit from the establishment and maintenance of a routine in order to maximize his functional abilities over time.   It will be important for Anthony Daugherty to have another person with him when in situations where he may need to process information, weigh the pros and cons of different options, and make decisions, in order to ensure that he fully understands and recalls all information to be considered.    Performance across neurocognitive testing is not a strong predictor of an individual's safety operating a motor vehicle. Should his family wish to pursue a formalized driving evaluation, they could reach out to the following agencies: [employer] in [place]: [phone] Driver Rehabilitative Services: [facility]: [place]: [phone] or [phone]   Mr. Maners is encouraged to attend to lifestyle factors for brain health (e.g., regular physical exercise, good nutrition habits, regular participation in cognitively-stimulating activities, and general stress management techniques), which are likely to have benefits for both emotional adjustment and cognition. Optimal control of vascular risk factors (including safe cardiovascular exercise and adherence to dietary recommendations) is encouraged. Continued participation in activities which provide mental stimulation and social interaction is also recommended.    Important information should be provided to Mr. Roycroft in written format in all instances. This information should be placed in a highly frequented and easily visible location within his home to promote recall. External strategies such as written notes in a consistently used memory journal, visual and nonverbal auditory cues such as a calendar on the refrigerator or appointments with alarm, such as on a cell phone, can also help maximize recall.   To address problems with fluctuating attention, he may wish to consider:   -Avoiding external distractions when needing to concentrate   -Limiting exposure to fast paced environments with multiple sensory demands   -Writing down complicated information and using checklists   -Attempting and completing one task at a time (i.e., no multi-tasking)   -Verbalizing aloud each step of a task to maintain focus   -Reducing the amount of information considered at one time  Review of  Records:   Mr. Monnig was seen by [facility] Sharene Butters, PA-C) on 04/12/2021 for an evaluation of memory loss. Mr. Desmith denied ongoing memory concerns. However, per his wife, there have been memory concerns for the past two years, especially since his implantable defibrillator was placed. His wife reported that he may engage in a conversation and then ask questions soon after about  topics previously discussed. She noted that he also has difficulty organizing his thoughts and following through with tasks. His wife also reported a longstanding history of irritability and anger issues throughout his life. He has been prescribed Prozac in the past and continues taking this medication. He reported vivid dreams but denied other sleep-related concerns, including REM sleep behaviors. He denied ongoing headaches, double vision, dizziness, focal numbness or tingling, unilateral weakness or tremors, urine incontinence or retention, constipation or diarrhea, or anosmia. At that time, he reported consuming around 28 cans of beer per week and denied any intention of cessation. There is report of a DUI in the past. Regarding ADLs, his wife has always managed finances. She also monitors medication adherence. He continues to drive but records suggest some concerns surrounding him recalling where he is going. Performance on a brief cognitive screening instrument (MOCA) was 22/30. Ultimately, Mr. Timothy was referred for a comprehensive neuropsychological evaluation to characterize his cognitive abilities and to assist with diagnostic clarity and treatment planning.   He completed a comprehensive neuropsychological evaluation with myself on 08/09/2021. Results suggested severe impairment surrounding all aspects of learning and memory. Additional performance variability was exhibited across complex attention and executive functioning. Performances were largely appropriate relative to age-matched peers across  processing speed, basic attention, safety/judgment, receptive and expressive language, and visuospatial abilities. As he generally denied ADL dysfunction, he was diagnosed with a mild neurocognitive disorder ("mild cognitive impairment"). Concerns were expressed for early stages of Alzheimer's disease given memory impairment with 0% to 14% retention across memory tasks. Chronically elevated alcohol use was reported and concerns were also expressed for an alcohol-related dementia presentation. Alcohol cessation, thiamine testing, neuroimaging, and repeat testing was recommended.   He saw Ms. Wertman for follow-up on 09/11/2021. He had been tolerating donepezil '10mg'$  well in the interim.   Brain MRI on 11/12/2021 revealed a 4m acute/early subacute infarct within the right caudate nucleus, a subcentimeter chronic infarct within the inferior right cerebellar hemisphere, mild microvascular ischemic disease, and mild generalized atrophy.   Thiamine testing was slightly low (7 nmol/L) on 04/12/2021. Thiamine testing was again slightly low (7 nmol/L) on 10/04/2021.  He was most recently seen for follow-up with Ms. Wertman on 06/19/2022. Recommendations at that time included for him to "continue B1 replenishment." He reported greatly diminishing alcohol intake to around one beer per day. His mood was also said to be better managed. Ultimately, Mr. WWilliardwas referred for a repeat neuropsychological evaluation to characterize his cognitive abilities and to assist with diagnostic clarity and treatment planning.   Past Medical History:  Diagnosis Date   Amnestic MCI (mild cognitive impairment with memory loss) 08/09/2021   Aortic insufficiency 10/21/2018   Mild-mod on echo 07/2017.  Moderate 08/2018.  Moderate-severe on echo 04/2019--> TEE 11/15/19 EF 50-55%, no LV dilation or WMA. Mod AV sclerosis w/out stenosis, mod-to-sev AI. 11/2020 moderate AI. severe eccentric by echo 12/2021.  Severe on echo 12/2021->cardiac MRI  confirmed   Aortic regurgitation 08/09/2021   Mild-mod on echo 07/2017.  Moderate 08/2018.  Moderate-severe on echo 04/2019--> TEE 11/15/19 EF 50-55%, no LV dilation or WMA. Mod AV sclerosis w/out stenosis, mod-to-sev AI-.>rpt echo 6 mo. 11/2020 moderate AI   Aortic root dilatation 10/2018   11/17/19 45 mm aortic root at the sinuses of Valsalva-> 52022 echo->40 mm, unchanged on echo 12/2021   Aortic stenosis, mild    mild by echo 12/2021   Arthritis    L-Spine   Cardiac murmur  Systolic and diastolic as of 12/2227.   Cardiomyopathy 10/12/2010   Global hypokinesis, EF 40-45% by cath and echo (2012 echo and 2019 echo).  Feb 2020 EF 20-25%. - mixed ICM/NICM (alcohol and CAD)  October 2020 EF 25-30%. 11/2020 EF 40-45%. 06/2021 EF 45-50%,dil LV+hypokinesis,mod AI,grd I DD.   Chronic systolic (congestive) heart failure 06/01/2019   max med mgmt, plus CRT-D placed 05/2019, EF improved to 50% after. Cardiac MRI 03/2022 EF 40%   Coronary artery disease 11/16/2010   a. 2012 - 80% OM lesion. b. Cath 12/2018 - showing 95% prox RCA, 75% OM1, 50% prox-mid LAD. Unsuccessful PCI to 100% chronically occluded RCA summer 2020. CT angio chest w/aorta 08/2021->severe coronary calc's, no aneurism. Mild subclav stenosis d/t athero   Diverticulosis    Erectile dysfunction 08/16/2011   Essential hypertension 07/19/2010   GAD (generalized anxiety disorder)    managed by Dr. Toy Care with prozac '20mg'$  qd   Gout    Usually Right great toe; colchicine helps well   Gouty arthritis of toe of left foot 05/27/2013   Habitual alcohol use    History of colonoscopy    Initial was normal; 2014-polypectomy and diverticulosis.  Recall 2019.   Hyperlipidemia 10/23/2012   LBBB (left bundle branch block)    CRT-D placed 05/2019   Low back strain 10/24/2013   Multiple lacunar infarcts 11/15/2021   Right caudate nucleus (acute/subacute 11/2021); remote subcentimeter infarct within the inferior right cerebellar hemisphere   Obesity, Class I,  BMI 30-34.9    Pulmonary nodule    0.5 cm RLL subpleural nodule.  Pt NEVER SMOKER.  Low risk->no f/u imaging is needed.   Subclavian artery stenosis, left    mild, detected on CT angio chest w/aorta 08/2021   Thyroid nodule 04/23/2013    Past Surgical History:  Procedure Laterality Date   BIV ICD INSERTION CRT-D N/A 06/01/2019   Procedure: BIV ICD INSERTION CRT-D;  Surgeon: [name], MD;  Location: [facility];  Service: Cardiovascular;  Laterality: N/A;   CARDIAC CATHETERIZATION  10/12/2010   diagnostic only: 1 vessel CAD with TIMI 3 flow, EF 40-45%.  Mild AS.  Medical mgmt of CAD.   COLONOSCOPY     COLONOSCOPY W/ POLYPECTOMY  10/06/2012   Diverticulosis and tubular adenoma w/out high grade dysplasia.  Repeat 5 yrs (Dr. Deatra Ina).   CORONARY ATHERECTOMY N/A 01/08/2019   100% chronic RCA occlusion impossible to clear.  Med mgmt. Procedure: CORONARY ATHERECTOMY;  Surgeon: Sherren Mocha, MD;  Location: [facility];  Service: Cardiovascular;  Laterality: N/A;   POLYPECTOMY     RIGHT HEART CATH AND CORONARY ANGIOGRAPHY N/A 12/24/2018   95% RCA occlusion, 75% obtuse marginal occlusion (3V dz). Procedure: RIGHT HEART CATH AND CORONARY ANGIOGRAPHY;  Surgeon: Lorretta Harp, MD;  Location: [facility];  Service: Cardiovascular;  Laterality: N/A;   TEE WITHOUT CARDIOVERSION N/A 11/15/2019   EF 50-55%, no LV dilation or WMA. Mod AV sclerosis w/out stenosis, mod-to-sev AI.  Procedure: TRANSESOPHAGEAL ECHOCARDIOGRAM (TEE);  Surgeon: Fay Records, MD;  Location: [facility];  Service: Cardiovascular;  Laterality: N/A;   TRANSTHORACIC ECHOCARDIOGRAM  09/2010; 07/2017; 08/2018; 10/2019;05/2020   2012:  EF 40-45%, global hypokinesis, mild AS.  07/2017: no chg except mild/mod AR.  08/2018 EF 20-25%, global LV hypok, DD, biatrial enlargemt, mod AR, aortic root 3.8 cm. 10/2019 EF 50%, mild dec RV fxn, mod-sev AR, aor root dil 42 mm. 05/2020 EF 45-50%,grd I DD, mod AI, aorta 53m, severe  LAE. 10/2020 EF 45-50%, mild LVH, global hypokin, grd I DD, mod AI. Echo 12/2021 EF 45-50%, severe AR    Current Outpatient Medications:    aspirin EC 81 MG tablet, Take 81 mg by mouth daily. Swallow whole., Disp: , Rfl:    atorvastatin (LIPITOR) 40 MG tablet, TAKE 1 TABLET DAILY, Disp: 90 tablet, Rfl: 3   donepezil (ARICEPT) 10 MG tablet, Take 1 tablet 10 mg daily, Disp: 30 tablet, Rfl: 11   empagliflozin (JARDIANCE) 10 MG TABS tablet, Take 1 tablet (10 mg total) by mouth daily before breakfast., Disp: 90 tablet, Rfl: 3   FLUoxetine (PROZAC) 20 MG capsule, Take 1 capsule (20 mg total) by mouth daily., Disp: 90 capsule, Rfl: 3   folic acid (FOLVITE) 1 MG tablet, Take 1 mg by mouth daily., Disp: , Rfl:    metoprolol succinate (TOPROL-XL) 100 MG 24 hr tablet, TAKE ONE TABLET BY MOUTH DAILY WITH A MEAL, Disp: 90 tablet, Rfl: 1   potassium chloride SA (KLOR-CON M) 20 MEQ tablet, Take 1 tablet (20 mEq total) by mouth daily., Disp: 90 tablet, Rfl: 1   sacubitril-valsartan (ENTRESTO) 97-103 MG, TAKE 1 TABLET 2 TIMES A DAY, Disp: 90 tablet, Rfl: 2   spironolactone (ALDACTONE) 25 MG tablet, TAKE ONE TABLET BY MOUTH DAILY, Disp: 90 tablet, Rfl: 1   thiamine (VITAMIN B-1) 100 MG tablet, Take 1 tablet (100 mg total) by mouth daily., Disp: 90 tablet, Rfl: 3  Clinical Interview:   The following information was obtained during a clinical interview with Mr. Hunsucker and his wife prior to cognitive testing.  Cognitive Symptoms: Decreased short-term memory: Denied. His wife previously reported more pronounced concerns, stating that Mr. Clausing sons had also expressed concerns surrounding deteriorating memory. She noted that Mr. Bonanno will have trouble recalling details of past conversations and will repeat himself or ask repetitive questions often. Concerns were said to have been present for the past 2-3 years and progressively declined. Acutely, relative to the previous evaluation, she described perceived  stability across memory functioning.  Decreased long-term memory: Denied. Decreased attention/concentration: Denied. Reduced processing speed: Denied. Difficulties with executive functions: Denied. Mr. Chasen previously denied trouble with impulsivity or any significant personality changes. His wife reported a longstanding history of irritability and trouble managing his anger. However, since previous testing, she noted that medications have been adjusted and more optimized. She noted that he has seemed far less angry, less anxious, and much easier to live with.  Difficulties with emotion regulation: Endorsed (see above).  Difficulties with receptive language: Denied. Difficulties with word finding: Denied. Decreased visuoperceptual ability: Denied.   Difficulties completing ADLs: Largely denied. His wife manages finances and bill paying which is longstanding in nature. He reported managing medications independently without difficulty. His wife previously expressed some concerns and records suggest that she monitors medication adherence. He denied any driving related concerns. His wife previously noted that he seems to have become more reliant on his GPS while traveling. Acutely, all ADL reporting was said to be stable relative to previous testing.   Additional Medical History: History of traumatic brain injury/concussion: Endorsed. When he was around 65-17 years old, he reported hitting the back of his head on a diving board, leading to him requiring about 24 stiches in the occipital region. No persisting difficulties or loss in consciousness were reported. No other potential head injuries were described.  History of stroke: Denied. History of seizure activity: Denied. History of known exposure to toxins: Denied. Symptoms of chronic pain: Denied.  Experience of frequent headaches/migraines: Denied. Frequent instances of dizziness/vertigo: Denied.   Sensory changes: He wears glasses with  benefit. Other sensory changes/difficulties (e.g., hearing, taste, or smell) were denied.  Balance/coordination difficulties: Denied. He also denied any recent falls.  Other motor difficulties: Denied.  Sleep History: Estimated hours obtained each night: 6-8 hours.  Difficulties falling asleep: Denied. His wife previously reported that he commonly stays up late and subsequently sleeps later into the morning. She described him having an abnormal sleep cycle because of this, noting that there may be some nights where he remains awake throughout the night. This was said to be stable, with his wife noting that the previous night was a night where he stayed up very late.  Difficulties staying asleep: Denied. Feels rested and refreshed upon awakening: Endorsed.   History of snoring: Denied. History of waking up gasping for air: Denied. Witnessed breath cessation while asleep: Denied.   History of vivid dreaming: Endorsed. Excessive movement while asleep: Denied. Instances of acting out his dreams: Denied.  Psychiatric/Behavioral Health History: Depression: He described his current mood as "fine" and denied any prior mental health concerns or diagnoses. His wife previously reported that he has been prescribed Prozac for an extended period of time, likely to treat anger and some mild anxiety concerns. As stated above, medications appear to have been optimized lately and his mood has been notably improved. Current or remote suicidal ideation, intent, or plan was denied.  Anxiety: Denied. Mania: Denied. Trauma History: Denied. Visual/auditory hallucinations: Denied. Delusional thoughts: Denied.   Tobacco: Endorsed. He reported regularly using chewing tobacco products.  Alcohol: Historically, his wife reported Mr. Miskell having a long history of alcohol abuse and prior DUI. As recent as September 2022, medical records suggested the consumption of 28 cans of beer per week. At the time of the  previous evaluation, this number had dropped to around 12 beers per week. Currently, his wife reported a great reduction in alcohol consumption. Medical records suggest Mr. Martos consuming perhaps one beer per day. During the current interview, he commented being unsure if he had consumed any alcohol during the current week. Prior thiamine testing was slightly low and supplementation had been started since previous testing. Recreational drugs: Denied.  Family History: Problem Relation Age of Onset   Congenital heart disease Brother        cardiomyopathy   Alzheimer's disease Mother    COPD Father    Heart failure Father        congestive   Colon cancer Neg Hx    Esophageal cancer Neg Hx    Stomach cancer Neg Hx    Rectal cancer Neg Hx    Colon polyps Neg Hx    This information was confirmed by Mr. Waddington.  Academic/Vocational History: Highest level of educational attainment: 16 years. He graduated from high school and earned a Dietitian in business administration. He described himself as an average (C) student in academic settings. Higher level math represented a likely relative weakness.  History of developmental delay: Denied. History of grade repetition: Denied. Enrollment in special education courses: Denied. History of LD/ADHD: Denied.   Employment: Retired. 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   Evaluation Results:   Behavioral Observations: Mr. Deringer was accompanied by his wife, arrived to his appointment on time, and was appropriately dressed and groomed. He appeared alert. Observed gait and station were within  normal limits. Gross motor functioning appeared intact upon informal observation and no abnormal movements (e.g., tremors) were noted. His affect was generally relaxed and positive, but did range  appropriately given the subject being discussed during the clinical interview. Spontaneous speech was fluent and word finding difficulties were not observed during the clinical interview. Thought processes were coherent, organized, and normal in content. Insight into his cognitive difficulties appeared poor as he denied all cognitive concerns during interview despite both the previous and current evaluations suggesting several areas of notable ongoing dysfunction.   During testing, sustained attention was appropriate. Task engagement was adequate and he persisted when challenged. There were times where he would stop and attempt to discontinue tasks early. This was most common across tasks with a time limit he had to reach [employer]). However, he responded well to encouragement provided by the psychometrist. Overall, Mr. Ripp was cooperative with the clinical interview and subsequent testing procedures.   Adequacy of Effort: The validity of neuropsychological testing is limited by the extent to which the individual being tested may be assumed to have exerted adequate effort during testing. Mr. Baskett expressed his intention to perform to the best of his abilities and exhibited adequate task engagement and persistence. Scores across stand-alone and embedded performance validity measures were variable but largely within expectation. His sole below expectation performance is likely due to true memory impairment rather than poor engagement or attempts to perform poorly. As such, the results of the current evaluation are believed to be a valid representation of Mr. Fowle current cognitive functioning.  Test Results: Mr. Gee was fairly disoriented at the time of the current evaluation. He incorrectly stated his age ("61"). He also incorrectly stated the current year ("2020") and was unable to state the current date, day of the week, or name of the current clinic.   Intellectual abilities  based upon educational and vocational attainment were estimated to be in the average range. Premorbid abilities were estimated to be within the average range based upon a single-word reading test.   Processing speed was variable, ranging from the well below average to average normative ranges. Basic attention was average. More complex attention (e.g., working memory) was below average to average. Executive functioning was variable. Cognitive flexibility was exceptionally low while abstract reasoning was average. He also performed in average normative range across a task assessing safety and judgment.   Assessed receptive language abilities were well below average. Despite this, he only lost a total of five points across this task, suggesting that this score may represent more of a normative impairment than a functional impairment. Mr. Radney did not exhibit any difficulties comprehending task instructions and answered all questions asked of him appropriately. Assessed expressive language (e.g., verbal fluency and confrontation naming) was generally average to above average. Semantic fluency did exhibit a little variability in that one task scored in the exceptionally low range.     Assessed visuospatial/visuoconstructional abilities were average outside of a poor performance across a line orientation task.    Learning (i.e., encoding) of novel verbal information was well below average to below average. Spontaneous delayed recall (i.e., retrieval) of previously learned information was exceptionally low. Retention rates were 0% across a story learning task, 0% across a list learning task, and 11% across a figure drawing task. Performance across recognition tasks was exceptionally low to below average, suggesting very limited evidence for information consolidation.   Results of emotional screening instruments suggested that recent symptoms of generalized anxiety were in  the minimal range, while symptoms of  depression were within normal limits. A screening instrument assessing recent sleep quality suggested the presence of minimal sleep dysfunction.  Tables of Scores:   Note: This summary of test scores accompanies the interpretive report and should not be considered in isolation without reference to the appropriate sections in the text. Descriptors are based on appropriate normative data and may be adjusted based on clinical judgment. Terms such as "Within Normal Limits" and "Outside Normal Limits" are used when a more specific description of the test score cannot be determined. Descriptors refer to the current evaluation only.         Percentile - Normative Descriptor > 98 - Exceptionally High 91-97 - Well Above Average 75-90 - Above Average 25-74 - Average 9-24 - Below Average 2-8 - Well Below Average < 2 - Exceptionally Low        Validity:    DESCRIPTOR   January 2023 Current    Dot Counting Test: --- --- --- Within Normal Limits  RBANS Effort Index: --- --- --- Outside Normal Limits  WAIS-IV Reliable Digit Span: --- --- --- Within Normal Limits        Orientation:       Raw Score Raw Score Percentile   NAB Orientation, Form 1 15/29 20/29 --- ---        Cognitive Screening:       Raw Score Raw Score Percentile   SLUMS: 17/30 20/30 --- ---        RBANS, Form A: Standard Score/ Scaled Score Standard Score/ Scaled Score Percentile   Total Score 66 67 1 Exceptionally Low  Immediate Memory 69 73 4 Well Below Average    List Learning '3 4 2 '$ Well Below Average    Story Memory '6 6 9 '$ Below Average  Visuospatial/Constructional 84 87 19 Below Average    Figure Copy 12 10 50 Average    Line Orientation 7/20 12/20 3-9 Well Below Average  Language 92 82 12 Below Average    Picture Naming 10/10 10/10 51-75 Average    Semantic Fluency '7 3 1 '$ Exceptionally Low  Attention 82 88 21 Below Average    Digit Span 7 11 63 Average    Coding '7 5 5 '$ Well Below Average  Delayed Memory 40 40 <1  Exceptionally Low    List Recall 0/10 0/10 <2 Exceptionally Low    List Recognition 12/20 11/20 <2 Exceptionally Low    Story Recall 2 1 <1 Exceptionally Low    Story Recognition 8/12 8/12 8-15 Below Average    Figure Recall '3 3 1 '$ Exceptionally Low    Figure Recognition 4/8 3/8 4-8 Well Below Average         Intellectual Functioning:       Standard Score Standard Score Percentile   Test of Premorbid Functioning: 94 94 34 Average        Attention/Executive Function:      Trail Making Test (TMT): Raw Score (T Score) Raw Score (T Score) Percentile     Part A 33 secs.,  0 errors (52) 36 secs.,  0 errors (49) 46 Average    Part B Discontinued Discontinued --- Impaired          Scaled Score Scaled Score Percentile   WAIS-IV Digit Span: '7 8 25 '$ Average    Forward 9 10 50 Average    Backward '5 6 9 '$ Below Average    Sequencing '9 8 25 '$ Average  Scaled Score Scaled Score Percentile   WAIS-IV Similarities: 11 10 50 Average        D-KEFS Verbal Fluency Test: Raw Score (Scaled Score) Raw Score (Scaled Score) Percentile     Letter Total Correct 34 (10) 37 (11) 63 Average    Category Total Correct 42 (14) 38 (12) 75 Above Average    Category Switching Total Correct 12 (10) 3 (1) <1 Exceptionally Low    Category Switching Accuracy 9 (8) 2 (1) <1 Exceptionally Low      Total Set Loss Errors 2 (10) 16 (1) <1 Exceptionally Low      Total Repetition Errors 18 (1) 14 (1) <1 Exceptionally Low        NAB Executive Functions Module, Form 1: T Score T Score Percentile     Judgment 53 45 31 Average        Language:      Verbal Fluency Test: Raw Score (T Score) Raw Score (T Score) Percentile     Phonemic Fluency (FAS) 34 (45) 37 (45) 31 Average    Animal Fluency 24 (60) 20 (53) 62 Average         NAB Language Module, Form 1: T Score T Score Percentile     Auditory Comprehension 56 29 2 Well Below Average    Naming 30/31 (56) 31/31 (59) 82 Above Average         Visuospatial/Visuoconstruction:       Raw Score Raw Score Percentile   Clock Drawing: 10/10 10/10 --- Within Normal Limits         Scaled Score Scaled Score Percentile   WAIS-IV Block Design: '8 8 25 '$ Average        Mood and Personality:       Raw Score Raw Score Percentile   Geriatric Depression Scale: 0 5 --- Within Normal Limits  Geriatric Anxiety Scale: 3 0 --- Minimal    Somatic 3 0 --- Minimal    Cognitive 0 0 --- Minimal    Affective 0 0 --- Minimal        Additional Questionnaires:       Raw Score Raw Score Percentile   PROMIS Sleep Disturbance Questionnaire: 17 13 --- None to Slight   Informed Consent and Coding/Compliance:   The current evaluation represents a clinical evaluation for the purposes previously outlined by the referral source and is in no way reflective of a forensic evaluation.   Mr. Glazebrook was provided with a verbal description of the nature and purpose of the present neuropsychological evaluation. Also reviewed were the foreseeable risks and/or discomforts and benefits of the procedure, limits of confidentiality, and mandatory reporting requirements of this provider. The patient was given the opportunity to ask questions and receive answers about the evaluation. Oral consent to participate was provided by the patient.   This evaluation was conducted by Christia Reading, Ph.D., ABPP-CN, board certified clinical neuropsychologist. Mr. Schar completed a clinical interview with Dr. Melvyn Novas, billed as one unit [phone], and 110 minutes of cognitive testing and scoring, billed as one unit [phone] and three additional units [id number]. Psychometrist Cruzita Lederer, B.S., assisted Dr. Melvyn Novas with test administration and scoring procedures. As a separate and discrete service, Dr. Melvyn Novas spent a total of 160 minutes in interpretation and report writing billed as one unit [phone] and two units [id number].

## 2022-07-17 ENCOUNTER — Ambulatory Visit: Payer: Medicare PPO | Admitting: Psychology

## 2022-07-17 DIAGNOSIS — I6381 Other cerebral infarction due to occlusion or stenosis of small artery: Secondary | ICD-10-CM

## 2022-07-17 DIAGNOSIS — G3184 Mild cognitive impairment, so stated: Secondary | ICD-10-CM

## 2022-07-17 DIAGNOSIS — F1090 Alcohol use, unspecified, uncomplicated: Secondary | ICD-10-CM

## 2022-07-17 NOTE — Progress Notes (Signed)
   Neuropsychology Feedback Session Anthony Daugherty. [facility]  Reason for Referral:   Anthony Daugherty is a 77 y.o. 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   Feedback:   Anthony Daugherty completed a comprehensive neuropsychological evaluation on 06/28/2022. Please refer to that encounter for the full report and recommendations. Briefly, results suggested a prominent impairment surrounding all aspects of learning and memory. Additional impairments were exhibited across cognitive flexibility and an isolated line orientation task. While a normative impairment was exhibited across a task assessing receptive language, he only missed a few items across this task and this appears to better represent a normative deficit rather than a clinical deficit. Some performance variability was exhibited across processing speed and semantic fluency. Relative to his previous evaluation in January 2023, greatest decline was exhibited across cognitive flexibility and semantic fluency, especially a semantic fluency based switching task (D-KEFS Verbal Fluency). However, not all of semantic fluency saw decline as some tasks remained both stable and normatively appropriate. Severe memory impairments across the prior evaluation were again exhibited. Where retention percentages ranged from 0% to 14% previously, they now ranged from 0% to 11%. All other domains (representing a majority of the evaluation) represented ongoing stability. Regarding etiology, concerns remain for underlying Alzheimer's disease. Despite modest evidence to suggest some learning across learning trials of memory tests, he was essentially amnestic  across all memory tasks after a very brief delay. He also performed poorly across yes/no recognition tasks. Taken together, this suggests rapid forgetting and an evolving and already quite significant memory storage impairment, both of which are the hallmark characteristics of this illness. Outside of Alzheimer's disease, there were prior concerns surrounding an alcohol-related dementia presentation. Fortunately, since his previous evaluation, Anthony Daugherty has greatly diminished alcohol consumption, down from 28 beers per week in September 2022 to perhaps seven beers per week currently. He also has started thiamine supplementation due to mildly low levels. Presently, as alcohol has diminished and thiamine levels regulated, no improvements were seen. I continue to feel greater concern for underlying Alzheimer's disease, simply made worse by his extensive alcohol history. However, prolonged sobriety will absolutely be beneficial for his physical and neurological health.  Anthony Daugherty was accompanied by his wife during the current feedback session. Content of the current session focused on the results of his neuropsychological evaluation. Anthony Daugherty was given the opportunity to ask questions and his questions were answered. The feedback session appeared to be abruptly ended as Anthony Daugherty became frustrated surrounding continued recommendations to stop elevated alcohol consumption. He was encouraged to reach out should additional questions arise. A copy of his report was provided at the conclusion of the visit.      20 minutes were spent conducting the current feedback session with Anthony Daugherty, billed as one unit [phone].

## 2022-08-05 ENCOUNTER — Ambulatory Visit (INDEPENDENT_AMBULATORY_CARE_PROVIDER_SITE_OTHER): Payer: Medicare PPO

## 2022-08-05 DIAGNOSIS — Z95810 Presence of automatic (implantable) cardiac defibrillator: Secondary | ICD-10-CM

## 2022-08-05 DIAGNOSIS — I5022 Chronic systolic (congestive) heart failure: Secondary | ICD-10-CM

## 2022-08-07 NOTE — Progress Notes (Signed)
EPIC Encounter for ICM Monitoring  Patient Name: Anthony Daugherty is a 77 y.o. male Date: 08/07/2022 Primary Care Physican: Tammi Sou, MD Primary Cardiologist: Radford Pax Electrophysiologist: Cyndi Bender Pacing: 99%         11/14/2021 Office Weight: 160 lbs   AT/AF Burden: 0%                                                            Transmission reviewed.    CorVue thoracic impedance suggesting normal fluid levels.   Prescribed: Spironolactone 25 mg take 0.5 tablet daily.   Labs: 10/30/2021 Creatinine 0.86, BUN 10, Potassium 3.7, Sodium 141, GFR 90 10/23/2021 Creatinine 0.98, BUN 11, Potassium 3.1, Sodium 142, GFR 80  10/04/2021 Creatinine 0.87, BUN 14, Potassium 3.7, Sodium 140  09/04/2021 Creatinine 0.96, BUN 26, Potassium 4.2, Sodium 140, GFR 82 A complete set of results can be found in Results Review.   Recommendations:  No changes.   Follow-up plan: ICM clinic phone appointment on 09/09/2022   91 day device clinic remote transmission 10/07/2022.   EP/Cardiology Office Visits:  Recall 01/02/2023 with EP.  09/04/2022 with Dr Radford Pax.   Copy of ICM check sent to Dr. Curt Bears.   3 month ICM trend: 08/05/2022.    12-14 Month ICM trend:     Rosalene Billings, RN 08/07/2022 3:47 PM

## 2022-09-04 ENCOUNTER — Encounter: Payer: Self-pay | Admitting: Cardiology

## 2022-09-04 ENCOUNTER — Ambulatory Visit: Payer: Medicare PPO

## 2022-09-04 ENCOUNTER — Ambulatory Visit: Payer: Medicare PPO | Attending: Cardiology | Admitting: Cardiology

## 2022-09-04 VITALS — BP 106/56 | HR 64 | Ht 66.0 in | Wt 176.0 lb

## 2022-09-04 DIAGNOSIS — I10 Essential (primary) hypertension: Secondary | ICD-10-CM

## 2022-09-04 DIAGNOSIS — I255 Ischemic cardiomyopathy: Secondary | ICD-10-CM

## 2022-09-04 DIAGNOSIS — I5022 Chronic systolic (congestive) heart failure: Secondary | ICD-10-CM

## 2022-09-04 DIAGNOSIS — E7800 Pure hypercholesterolemia, unspecified: Secondary | ICD-10-CM | POA: Diagnosis not present

## 2022-09-04 DIAGNOSIS — I2510 Atherosclerotic heart disease of native coronary artery without angina pectoris: Secondary | ICD-10-CM | POA: Diagnosis not present

## 2022-09-04 DIAGNOSIS — I447 Left bundle-branch block, unspecified: Secondary | ICD-10-CM

## 2022-09-04 DIAGNOSIS — I359 Nonrheumatic aortic valve disorder, unspecified: Secondary | ICD-10-CM | POA: Diagnosis not present

## 2022-09-04 DIAGNOSIS — I77810 Thoracic aortic ectasia: Secondary | ICD-10-CM

## 2022-09-04 NOTE — Patient Instructions (Signed)
Medication Instructions:  Your physician recommends that you continue on your current medications as directed. Please refer to the Current Medication list given to you today.  *If you need a refill on your cardiac medications before your next appointment, please call your pharmacy*   Lab Work: Please complete a FASTING lipid panel and an ALT in our lab today before you leave.   If you have labs (blood work) drawn today and your tests are completely normal, you will receive your results only by: [place] (if you have MyChart) OR A paper copy in the mail If you have any lab test that is abnormal or we need to change your treatment, we will call you to review the results.   Testing/Procedures: None.   Follow-Up: At [facility], you and your health needs are our priority.  As part of our continuing mission to provide you with exceptional heart care, we have created designated Provider Care Teams.  These Care Teams include your primary Cardiologist (physician) and Advanced Practice Providers (APPs -  Physician Assistants and Nurse Practitioners) who all work together to provide you with the care you need, when you need it.  We recommend signing up for the patient portal called "MyChart".  Sign up information is provided on this After Visit Summary.  MyChart is used to connect with patients for Virtual Visits (Telemedicine).  Patients are able to view lab/test results, encounter notes, upcoming appointments, etc.  Non-urgent messages can be sent to your provider as well.   To learn more about what you can do with MyChart, go to [email or web address].    Your next appointment:   6 month(s)  Provider:   Fransico Him, MD     Other Instructions You have been referred to our EP department. Someone will call you to schedule an appointment with Dr. Doralee Albino.

## 2022-09-04 NOTE — Addendum Note (Signed)
Addended by: Joni Reining on: 09/04/2022 01:18 PM   Modules accepted: Orders

## 2022-09-04 NOTE — Progress Notes (Signed)
Date:  09/04/2022   ID:  [name], DOB 01-11-1946, MRN [id number]  Patient Location:  [facility]  Provider location:   [place]  PCP:  Tammi Sou, MD  Cardiologist:  Fransico Him, MD  Electrophysiologist:  [name], MD (Inactive)   Chief Complaint:  DCM and AI  History of Present Illness: He   Anthony Daugherty is a 77 y.o. male  with a history of aortic stenosis and AR with dilated aortic root.  He apparently had an echo 07/2017 showing mild aortic stenosis and mild to moderate AR and aortic root 60m.  He also has a history of HTN, hyperlipidemia, nonischemic DCM with EF 40-45 by cath and echo in 2012 and ASCAD with 80% OM lesion medically managed.    Repeat echo 08/2018 showed moderate AR with aortic root 329m  Unfortunately his echo also shows that his LVF has significantly declined with EF 20-25% with mildly dilated LV and diffuse HK.  There was severe LAE and moderate RAE and mild pulmonary HTN. At his initial OV in [id number] Bystolic was changed to Carvedilol and he was continued on ARB.    He underwent cath 12/2018 showing 95% pRCA, 75% OM1 and 50% prox to mid LAD.  He underwent attempted PCI of the RCA but unable to cross the lesion and medical therapy was recommended.  It was felt that his CM was mixed ICM/NICM with possible ETOH contribution as well.  He has seen my extenders several times trying to titrate HF meds but has been limited by soft Bps.  He was on Entresto 49/5127mID, Carvedilol 29m30mD and repeat echo 04/2019 showed EF 25-30% with mild AS and moderate to severe AI.  There was no M spike on SPEP/UPEP and TSH and ferritin were normal.  He was referred to EP and underwent [employer] BiV ICD on 06/01/2019.    2D echo 05/2020 showed improved LVF after BiVICD with EF 45-50%, mild AS and moderate AR, mild MR. He was seen by Dr. CoopBurt KnackJune 2022 for moderate to severe aortic insufficiency with mildly dilated aortic root.  He had a repeat cardiac MRI  MRA showing EF 45 to 50% with severe aortic insufficiency an holo diastolic flow reversal in the descending aorta.  Dr. CoopBurt Knack him back in consultation and given the fact that he was asymptomatic, LV dimensions were normal and given his significant dementia that he had developed and the fact that the patient did not want surgery he felt patient should be followed going forward with conservative management with no plans for any type of invasive procedure on his aortic valve.  he is here today for followup and is doing well.  He denies any chest pain or pressure, SOB, DOE, PND, orthopnea, LE edema, dizziness, palpitations or syncope. He is compliant with his meds and is tolerating meds with no SE.   Prior CV studies:   The following studies were reviewed today:  2D echo 12/2021 IMPRESSIONS   1. Left ventricular ejection fraction, by estimation, is 45 to 50%. Left  ventricular ejection fraction by 3D volume is 46 %. The left ventricle has  mildly decreased function. The left ventricle demonstrates global  hypokinesis. The left ventricular  internal cavity size was mildly dilated. There is mild left ventricular  hypertrophy. Left ventricular diastolic parameters are consistent with  Grade I diastolic dysfunction (impaired relaxation).   2. Right ventricular systolic function is normal. The right ventricular  size is normal.  There is normal pulmonary artery systolic pressure. The  estimated right ventricular systolic pressure is [id number] mmHg.   3. Left atrial size was mildly dilated.   4. The mitral valve is grossly normal. Mild mitral valve regurgitation.  No evidence of mitral stenosis.   5. The aortic valve is abnormal. There is moderate calcification of the  aortic valve. Aortic valve regurgitation is severe, eccentric and  posteriorly directed. Holodiastolic flow reversal noted in descending  aorta Doppler. Mild aortic valve stenosis.  Aortic valve mean gradient measures 10.7 mmHg.   6.  Aortic dilatation noted. There is mild dilatation of the aortic root,  measuring 40 mm.   7. The inferior vena cava is normal in size with greater than 50%  respiratory variability, suggesting right atrial pressure of 3 mmHg.   Past Medical History:  Diagnosis Date   Amnestic MCI (mild cognitive impairment with memory loss) 08/09/2021   Aortic insufficiency 10/21/2018   Mild-mod on echo 07/2017.  Moderate 08/2018.  Moderate-severe on echo 04/2019--> TEE 11/15/19 EF 50-55%, no LV dilation or WMA. Mod AV sclerosis w/out stenosis, mod-to-sev AI. 11/2020 moderate AI. severe eccentric by echo 12/2021.  Severe on echo 12/2021->cardiac MRI confirmed   Aortic regurgitation 08/09/2021   Mild-mod on echo 07/2017.  Moderate 08/2018.  Moderate-severe on echo 04/2019--> TEE 11/15/19 EF 50-55%, no LV dilation or WMA. Mod AV sclerosis w/out stenosis, mod-to-sev AI-.>rpt echo 6 mo. 11/2020 moderate AI   Aortic root dilatation 10/2018   11/17/19 45 mm aortic root at the sinuses of Valsalva-> 52022 echo->40 mm, unchanged on echo 12/2021   Aortic stenosis, mild    mild by echo 12/2021   Arthritis    L-Spine   Cardiac murmur    Systolic and diastolic as of [id number].   Cardiomyopathy 10/12/2010   Global hypokinesis, EF 40-45% by cath and echo (2012 echo and 2019 echo).  Feb 2020 EF 20-25%. - mixed ICM/NICM (alcohol and CAD)  October 2020 EF 25-30%. 11/2020 EF 40-45%. 06/2021 EF 45-50%,dil LV+hypokinesis,mod AI,grd I DD.   Chronic systolic (congestive) heart failure 06/01/2019   max med mgmt, plus CRT-D placed 05/2019, EF improved to 50% after. Cardiac MRI 03/2022 EF 40%   Coronary artery disease 11/16/2010   a. 2012 - 80% OM lesion. b. Cath 12/2018 - showing 95% prox RCA, 75% OM1, 50% prox-mid LAD. Unsuccessful PCI to 100% chronically occluded RCA summer 2020. CT angio chest w/aorta 08/2021->severe coronary calc's, no aneurism. Mild subclav stenosis d/t athero   Diverticulosis    Erectile dysfunction 08/16/2011   Essential  hypertension 07/19/2010   GAD (generalized anxiety disorder)    managed by Dr. Toy Care with prozac 41m qd   Gout    Usually Right great toe; colchicine helps well   Gouty arthritis of toe of left foot 05/27/2013   Habitual alcohol use    History of colonoscopy    Initial was normal; 2014-polypectomy and diverticulosis.  Recall 2019.   Hyperlipidemia 10/23/2012   LBBB (left bundle branch block)    CRT-D placed 05/2019   Low back strain 10/24/2013   Multiple lacunar infarcts 11/15/2021   Right caudate nucleus (acute/subacute 11/2021); remote subcentimeter infarct within the inferior right cerebellar hemisphere   Obesity, Class I, BMI 30-34.9    Pulmonary nodule    0.5 cm RLL subpleural nodule.  Pt NEVER SMOKER.  Low risk->no f/u imaging is needed.   Subclavian artery stenosis, left    mild, detected on CT angio chest w/aorta 08/2021   Thyroid  nodule 04/23/2013   Past Surgical History:  Procedure Laterality Date   BIV ICD INSERTION CRT-D N/A 06/01/2019   Procedure: BIV ICD INSERTION CRT-D;  Surgeon: [name], MD;  Location: [facility];  Service: Cardiovascular;  Laterality: N/A;   CARDIAC CATHETERIZATION  10/12/2010   diagnostic only: 1 vessel CAD with TIMI 3 flow, EF 40-45%.  Mild AS.  Medical mgmt of CAD.   COLONOSCOPY     COLONOSCOPY W/ POLYPECTOMY  10/06/2012   Diverticulosis and tubular adenoma w/out high grade dysplasia.  Repeat 5 yrs (Dr. Deatra Ina).   CORONARY ATHERECTOMY N/A 01/08/2019   100% chronic RCA occlusion impossible to clear.  Med mgmt. Procedure: CORONARY ATHERECTOMY;  Surgeon: Sherren Mocha, MD;  Location: [facility];  Service: Cardiovascular;  Laterality: N/A;   POLYPECTOMY     RIGHT HEART CATH AND CORONARY ANGIOGRAPHY N/A 12/24/2018   95% RCA occlusion, 75% obtuse marginal occlusion (3V dz). Procedure: RIGHT HEART CATH AND CORONARY ANGIOGRAPHY;  Surgeon: Lorretta Harp, MD;  Location: [facility];  Service: Cardiovascular;  Laterality:  N/A;   TEE WITHOUT CARDIOVERSION N/A 11/15/2019   EF 50-55%, no LV dilation or WMA. Mod AV sclerosis w/out stenosis, mod-to-sev AI.  Procedure: TRANSESOPHAGEAL ECHOCARDIOGRAM (TEE);  Surgeon: Fay Records, MD;  Location: [facility];  Service: Cardiovascular;  Laterality: N/A;   TRANSTHORACIC ECHOCARDIOGRAM  09/2010; 07/2017; 08/2018; 10/2019;05/2020   2012:  EF 40-45%, global hypokinesis, mild AS.  07/2017: no chg except mild/mod AR.  08/2018 EF 20-25%, global LV hypok, DD, biatrial enlargemt, mod AR, aortic root 3.8 cm. 10/2019 EF 50%, mild dec RV fxn, mod-sev AR, aor root dil 42 mm. 05/2020 EF 45-50%,grd I DD, mod AI, aorta 38m, severe LAE. 10/2020 EF 45-50%, mild LVH, global hypokin, grd I DD, mod AI. Echo 12/2021 EF 45-50%, severe AR     Current Meds  Medication Sig   aspirin EC 81 MG tablet Take 81 mg by mouth daily. Swallow whole.   atorvastatin (LIPITOR) 40 MG tablet TAKE 1 TABLET DAILY   donepezil (ARICEPT) 10 MG tablet Take 1 tablet 10 mg daily   empagliflozin (JARDIANCE) 10 MG TABS tablet Take 1 tablet (10 mg total) by mouth daily before breakfast.   FLUoxetine (PROZAC) 20 MG capsule Take 1 capsule (20 mg total) by mouth daily.   folic acid (FOLVITE) 1 MG tablet Take 1 mg by mouth daily.   metoprolol succinate (TOPROL-XL) 100 MG 24 hr tablet TAKE ONE TABLET BY MOUTH DAILY WITH A MEAL   potassium chloride SA (KLOR-CON M) 20 MEQ tablet Take 1 tablet (20 mEq total) by mouth daily.   sacubitril-valsartan (ENTRESTO) 97-103 MG TAKE 1 TABLET 2 TIMES A DAY   spironolactone (ALDACTONE) 25 MG tablet TAKE ONE TABLET BY MOUTH DAILY   thiamine (VITAMIN B-1) 100 MG tablet Take 1 tablet (100 mg total) by mouth daily.     Allergies:   Penicillins   Social History   Tobacco Use   Smoking status: Former   Smokeless tobacco: Current    Types: Chew   Tobacco comments:    chews tobacco  Vaping Use   Vaping Use: Never used  Substance Use Topics   Alcohol use: Yes    Comment: 9/22 - 28 per week;  1/23 - 12 per week, 06/28/22 no drinks during past week   Drug use: No     Family Hx: The patient's family history includes Alzheimer's disease in his mother; COPD in his father; Congenital  heart disease in his brother; Heart failure in his father. There is no history of Colon cancer, Esophageal cancer, Stomach cancer, Rectal cancer, or Colon polyps.  ROS:   Please see the history of present illness.     All other systems reviewed and are negative.   Labs/Other Tests and Data Reviewed:    Recent Labs: 04/08/2022: ALT 11; Hemoglobin 14.3; Platelets 181.0 04/22/2022: BUN 13; Creatinine, Ser 0.91; Magnesium 2.1; Potassium 4.0; Sodium 141   Recent Lipid Panel Lab Results  Component Value Date/Time   CHOL 127 10/04/2021 11:25 AM   CHOL 141 04/26/2019 09:12 AM   TRIG 42.0 10/04/2021 11:25 AM   TRIG 105 05/10/2008 12:00 AM   HDL 61.30 10/04/2021 11:25 AM   HDL 72 04/26/2019 09:12 AM   CHOLHDL 2 10/04/2021 11:25 AM   LDLCALC 57 10/04/2021 11:25 AM   LDLCALC 57 04/26/2019 09:12 AM   LDLCALC 101 05/10/2008 12:00 AM    Wt Readings from Last 3 Encounters:  09/04/22 176 lb (79.8 kg)  06/19/22 173 lb (78.5 kg)  04/08/22 169 lb 6.4 oz (76.8 kg)     Objective:    Vital Signs:  BP (!) 106/56   Pulse 64   Ht 5' 6"$  (1.676 m)   Wt 176 lb (79.8 kg)   SpO2 98%   BMI 28.41 kg/m   GEN: Well nourished, well developed in no acute distress HEENT: Normal NECK: No JVD; No carotid bruits LYMPHATICS: No lymphadenopathy CARDIAC:RRR, no  rubs, gallops 2/6 SMR and 2/6 DMR at RUSB RESPIRATORY:  Clear to auscultation without rales, wheezing or rhonchi  ABDOMEN: Soft, non-tender, non-distended MUSCULOSKELETAL:  No edema; No deformity  SKIN: Warm and dry NEUROLOGIC:  Alert and oriented x 3 PSYCHIATRIC:  Normal affect  ASSESSMENT & PLAN:    1.  Severe mixed DCM -EF on last echo 05/2020 showed improved LVF with EF 45-50% after BiV AICD implant a year ago and EF continues to remain stable at 45  to 50% on echo 06/20/2021 and cardiac MRI 02/2022 -He appears euvolemic on exam today -s/p CRT-D in 2020 with improvement in EF>>followed in device clinic -Continue drug management Toprol-XL 100 mg daily, Entresto 97-23 mg twice daily and Aldactone 25 mg daily with as needed refills -I have personally reviewed and interpreted outside labs performed by patient's PCP which showed serum creatinine 0.91 and potassium 4 on 04/22/2022  2.  Chronic combined systolic/diastolic CHF -he is NYHA class 2a -Cardiac MRI 02/2022 showed EF 45 to 50%. -He appears euvolemic on exam today Continue prescription drug-Toprol-XL 100 mg daily, Entresto 97-103 mg twice daily, Jardiance 10 mg daily and spironolactone 25 mg daily with as needed refills  3.  Aortic valve disease -2D echo 06-2021 shows stable low normal LV function with EF 45 to 50% with moderate to severe eccentric AI -mild AS with mean AVG 12 mmHg by echo 12/22 -Cardiac MRI/MRA 03/03/2022 showed severe aortic insufficiency with holodiastolic flow reversal also in the descending aorta and AI regurgitant fraction 45%  -He is being followed in structural heart clinic by Dr. Burt Knack.  Since his study did not show any LV dilatation and pulse pressure was normal and he was asymptomatic he was not interested interested at that time and pursuing cardiac surgery.  It was felt that because he was having a moderate problems with short-term memory impairment from Alzheimer's dementia it was felt that no further workup should be pursued for aortic valve replacement at this time and conservative measures were recommended.  4.  Mild ascending aortic and aortic root dilatation -Chest CTA measuring aortic root at SOV 73m at SOV, 336min ascending aorta and abdominal CTA with aortic atherosclerosis but no AAA -Cardiac MRI 03/13/2022 showed severe aortic insufficiency with regurgitant fraction [id number] holodiastolic flow reversal in the descending aorta and no thoracic aortic  dilatation -continue statin and BP control  5.  ASCAD - cath 12/2018 showing 95% pRCA, 75% OM1 and 50% prox to mid LAD.  He underwent attempted PCI of the RCA but unable to cross the lesion and medical therapy was recommended. -He has not had any and -Continue prescription management with aspirin 81 mg daily, atorvastatin 40 mg daily and Toprol-XL 100 mg daily with as needed refills  6.  HLD -LDL goal < 70 -Continue drug management with atorvastatin 40 mg daily with as needed refills -Check FLP and ALT  7.  HTN -BP is controlled on exam today -Continue prescription drug management with Aldactone 25 mg daily, Entresto 97-23 mg twice daily, Toprol-XL 100 mg daily with as needed refills  Medication Adjustments/Labs and Tests Ordered: Current medicines are reviewed at length with the patient today.  Concerns regarding medicines are outlined above.  Tests Ordered: No orders of the defined types were placed in this encounter.  Medication Changes: No orders of the defined types were placed in this encounter.   Disposition:  Follow up in 6 months  Signed, TrFransico HimMD  09/04/2022 1:07 PM    [place]

## 2022-09-05 LAB — LIPID PANEL
Chol/HDL Ratio: 2.6 ratio (ref 0.0–5.0)
Cholesterol, Total: 123 mg/dL (ref 100–199)
HDL: 47 mg/dL (ref >39)
LDL Chol Calc (NIH): 61 mg/dL (ref 0–99)
Triglycerides: 76 mg/dL (ref 0–149)
VLDL Cholesterol Cal: 15 mg/dL (ref 5–40)

## 2022-09-05 LAB — ALT: ALT: 15 IU/L (ref 0–44)

## 2022-09-09 ENCOUNTER — Ambulatory Visit: Payer: Medicare PPO | Attending: Cardiology

## 2022-09-09 DIAGNOSIS — I5042 Chronic combined systolic (congestive) and diastolic (congestive) heart failure: Secondary | ICD-10-CM | POA: Diagnosis not present

## 2022-09-09 DIAGNOSIS — Z95810 Presence of automatic (implantable) cardiac defibrillator: Secondary | ICD-10-CM

## 2022-09-11 ENCOUNTER — Ambulatory Visit (INDEPENDENT_AMBULATORY_CARE_PROVIDER_SITE_OTHER): Payer: Medicare PPO

## 2022-09-11 VITALS — Wt 176.0 lb

## 2022-09-11 DIAGNOSIS — Z1211 Encounter for screening for malignant neoplasm of colon: Secondary | ICD-10-CM | POA: Diagnosis not present

## 2022-09-11 DIAGNOSIS — Z0000 Encounter for general adult medical examination without abnormal findings: Secondary | ICD-10-CM | POA: Diagnosis not present

## 2022-09-11 NOTE — Patient Instructions (Signed)
Anthony Daugherty , Thank you for taking time to come for your Medicare Wellness Visit. I appreciate your ongoing commitment to your health goals. Please review the following plan we discussed and let me know if I can assist you in the future.   These are the goals we discussed:  Goals      Increase physical activity     Increase activity by more yard work and walking.      Patient Stated     None at this time      Patient Stated     None at this time         This is a list of the screening recommended for you and due dates:  Health Maintenance  Topic Date Due   Colon Cancer Screening  10/09/2017   COVID-19 Vaccine (7 - 2023-24 season) 07/09/2022   Medicare Annual Wellness Visit  09/12/2023   DTaP/Tdap/Td vaccine (3 - Td or Tdap) 08/27/2027   Pneumonia Vaccine  Completed   Flu Shot  Completed   Hepatitis C Screening: USPSTF Recommendation to screen - Ages 18-79 yo.  Completed   Zoster (Shingles) Vaccine  Completed   HPV Vaccine  Aged Out    Advanced directives: Please bring a copy of your health care power of attorney and living will to the office at your convenience.  Conditions/risks identified: none at this time   Next appointment: Follow up in one year for your annual wellness visit.   Preventive Care 76 Years and Older, Male  Preventive care refers to lifestyle choices and visits with your health care provider that can promote health and wellness. What does preventive care include? A yearly physical exam. This is also called an annual well check. Dental exams once or twice a year. Routine eye exams. Ask your health care provider how often you should have your eyes checked. Personal lifestyle choices, including: Daily care of your teeth and gums. Regular physical activity. Eating a healthy diet. Avoiding tobacco and drug use. Limiting alcohol use. Practicing safe sex. Taking low doses of aspirin every day. Taking vitamin and mineral supplements as recommended by  your health care provider. What happens during an annual well check? The services and screenings done by your health care provider during your annual well check will depend on your age, overall health, lifestyle risk factors, and family history of disease. Counseling  Your health care provider may ask you questions about your: Alcohol use. Tobacco use. Drug use. Emotional well-being. Home and relationship well-being. Sexual activity. Eating habits. History of falls. Memory and ability to understand (cognition). Work and work Statistician. Screening  You may have the following tests or measurements: Height, weight, and BMI. Blood pressure. Lipid and cholesterol levels. These may be checked every 5 years, or more frequently if you are over 37 years old. Skin check. Lung cancer screening. You may have this screening every year starting at age 83 if you have a 30-pack-year history of smoking and currently smoke or have quit within the past 15 years. Fecal occult blood test (FOBT) of the stool. You may have this test every year starting at age 51. Flexible sigmoidoscopy or colonoscopy. You may have a sigmoidoscopy every 5 years or a colonoscopy every 10 years starting at age 31. Prostate cancer screening. Recommendations will vary depending on your family history and other risks. Hepatitis C blood test. Hepatitis B blood test. Sexually transmitted disease (STD) testing. Diabetes screening. This is done by checking your blood sugar (glucose) after  you have not eaten for a while (fasting). You may have this done every 1-3 years. Abdominal aortic aneurysm (AAA) screening. You may need this if you are a current or former smoker. Osteoporosis. You may be screened starting at age 70 if you are at high risk. Talk with your health care provider about your test results, treatment options, and if necessary, the need for more tests. Vaccines  Your health care provider may recommend certain vaccines,  such as: Influenza vaccine. This is recommended every year. Tetanus, diphtheria, and acellular pertussis (Tdap, Td) vaccine. You may need a Td booster every 10 years. Zoster vaccine. You may need this after age 61. Pneumococcal 13-valent conjugate (PCV13) vaccine. One dose is recommended after age 26. Pneumococcal polysaccharide (PPSV23) vaccine. One dose is recommended after age 65. Talk to your health care provider about which screenings and vaccines you need and how often you need them. This information is not intended to replace advice given to you by your health care provider. Make sure you discuss any questions you have with your health care provider. Document Released: 07/28/2015 Document Revised: 03/20/2016 Document Reviewed: 05/02/2015 Elsevier Interactive Patient Education  2017 [employer] Prevention in the Home Falls can cause injuries. They can happen to people of all ages. There are many things you can do to make your home safe and to help prevent falls. What can I do on the outside of my home? Regularly fix the edges of walkways and driveways and fix any cracks. Remove anything that might make you trip as you walk through a door, such as a raised step or threshold. Trim any bushes or trees on the path to your home. Use bright outdoor lighting. Clear any walking paths of anything that might make someone trip, such as rocks or tools. Regularly check to see if handrails are loose or broken. Make sure that both sides of any steps have handrails. Any raised decks and porches should have guardrails on the edges. Have any leaves, snow, or ice cleared regularly. Use sand or salt on walking paths during winter. Clean up any spills in your garage right away. This includes oil or grease spills. What can I do in the bathroom? Use night lights. Install grab bars by the toilet and in the tub and shower. Do not use towel bars as grab bars. Use non-skid mats or decals in the tub or  shower. If you need to sit down in the shower, use a plastic, non-slip stool. Keep the floor dry. Clean up any water that spills on the floor as soon as it happens. Remove soap buildup in the tub or shower regularly. Attach bath mats securely with double-sided non-slip rug tape. Do not have throw rugs and other things on the floor that can make you trip. What can I do in the bedroom? Use night lights. Make sure that you have a light by your bed that is easy to reach. Do not use any sheets or blankets that are too big for your bed. They should not hang down onto the floor. Have a firm chair that has side arms. You can use this for support while you get dressed. Do not have throw rugs and other things on the floor that can make you trip. What can I do in the kitchen? Clean up any spills right away. Avoid walking on wet floors. Keep items that you use a lot in easy-to-reach places. If you need to reach something above you, use a strong  step stool that has a grab bar. Keep electrical cords out of the way. Do not use floor polish or wax that makes floors slippery. If you must use wax, use non-skid floor wax. Do not have throw rugs and other things on the floor that can make you trip. What can I do with my stairs? Do not leave any items on the stairs. Make sure that there are handrails on both sides of the stairs and use them. Fix handrails that are broken or loose. Make sure that handrails are as long as the stairways. Check any carpeting to make sure that it is firmly attached to the stairs. Fix any carpet that is loose or worn. Avoid having throw rugs at the top or bottom of the stairs. If you do have throw rugs, attach them to the floor with carpet tape. Make sure that you have a light switch at the top of the stairs and the bottom of the stairs. If you do not have them, ask someone to add them for you. What else can I do to help prevent falls? Wear shoes that: Do not have high heels. Have  rubber bottoms. Are comfortable and fit you well. Are closed at the toe. Do not wear sandals. If you use a stepladder: Make sure that it is fully opened. Do not climb a closed stepladder. Make sure that both sides of the stepladder are locked into place. Ask someone to hold it for you, if possible. Clearly mark and make sure that you can see: Any grab bars or handrails. First and last steps. Where the edge of each step is. Use tools that help you move around (mobility aids) if they are needed. These include: Canes. Walkers. Scooters. Crutches. Turn on the lights when you go into a dark area. Replace any light bulbs as soon as they burn out. Set up your furniture so you have a clear path. Avoid moving your furniture around. If any of your floors are uneven, fix them. If there are any pets around you, be aware of where they are. Review your medicines with your doctor. Some medicines can make you feel dizzy. This can increase your chance of falling. Ask your doctor what other things that you can do to help prevent falls. This information is not intended to replace advice given to you by your health care provider. Make sure you discuss any questions you have with your health care provider. Document Released: 04/27/2009 Document Revised: 12/07/2015 Document Reviewed: 08/05/2014 Elsevier Interactive Patient Education  2017 [employer].

## 2022-09-11 NOTE — Progress Notes (Signed)
I connected with  Joellyn Haff on 09/11/22 by a audio enabled telemedicine application and verified that I am speaking with the correct person using two identifiers.  Patient Location: Home  Provider Location: Home Office  I discussed the limitations of evaluation and management by telemedicine. The patient expressed understanding and agreed to proceed.   Subjective:   Anthony Daugherty is a 77 y.o. male who presents for Medicare Annual/Subsequent preventive examination.  Review of Systems     Cardiac Risk Factors include: advanced age (>57mn, >>71women);hypertension;dyslipidemia;male gender     Objective:    Today's Vitals   09/11/22 1307  Weight: 176 lb (79.8 kg)   Body mass index is 28.41 kg/m.     09/11/2022    1:11 PM 06/19/2022   12:58 PM 12/13/2021   11:42 AM 11/14/2021   11:00 AM 09/11/2021    1:12 PM 08/29/2021    2:44 PM 04/12/2021    8:09 AM  Advanced Directives  Does Patient Have a Medical Advance Directive? Yes Yes Yes Yes Yes Yes Yes  Type of [place]amed[place]of [place] will     [place] Chart? No - copy requested          Current Medications (verified) Outpatient Encounter Medications as of 09/11/2022  Medication Sig   aspirin EC 81 MG tablet Take 81 mg by mouth daily. Swallow whole.   atorvastatin (LIPITOR) 40 MG tablet TAKE 1 TABLET DAILY   donepezil (ARICEPT) 10 MG tablet Take 1 tablet 10 mg daily   empagliflozin (JARDIANCE) 10 MG TABS tablet Take 1 tablet (10 mg total) by mouth daily before breakfast.   FLUoxetine (PROZAC) 20 MG capsule Take 1 capsule (20 mg total) by mouth daily.   folic acid (FOLVITE) 1 MG tablet Take 1 mg by mouth daily.   metoprolol succinate (TOPROL-XL) 100 MG 24 hr tablet TAKE ONE TABLET BY MOUTH DAILY WITH A MEAL   potassium chloride SA (KLOR-CON M) 20 MEQ tablet Take 1 tablet (20 mEq total) by mouth daily.   sacubitril-valsartan  (ENTRESTO) 97-103 MG TAKE 1 TABLET 2 TIMES A DAY   spironolactone (ALDACTONE) 25 MG tablet TAKE ONE TABLET BY MOUTH DAILY   thiamine (VITAMIN B-1) 100 MG tablet Take 1 tablet (100 mg total) by mouth daily.   No facility-administered encounter medications on file as of 09/11/2022.    Allergies (verified) Penicillins   History: Past Medical History:  Diagnosis Date   Amnestic MCI (mild cognitive impairment with memory loss) 08/09/2021   Aortic insufficiency 10/21/2018   Mild-mod on echo 07/2017.  Moderate 08/2018.  Moderate-severe on echo 04/2019--> TEE 11/15/19 EF 50-55%, no LV dilation or WMA. Mod AV sclerosis w/out stenosis, mod-to-sev AI. 11/2020 moderate AI. severe eccentric by echo 12/2021.  Severe on echo 12/2021->cardiac MRI confirmed   Aortic regurgitation 08/09/2021   Mild-mod on echo 07/2017.  Moderate 08/2018.  Moderate-severe on echo 04/2019--> TEE 11/15/19 EF 50-55%, no LV dilation or WMA. Mod AV sclerosis w/out stenosis, mod-to-sev AI-.>rpt echo 6 mo. 11/2020 moderate AI   Aortic root dilatation 10/2018   11/17/19 45 mm aortic root at the sinuses of Valsalva-> 52022 echo->40 mm, unchanged on echo 12/2021   Aortic stenosis, mild    mild by echo 12/2021   Arthritis    L-Spine   Cardiac murmur    Systolic and diastolic as of [id number]   Cardiomyopathy 10/12/2010   Global hypokinesis, EF  40-45% by cath and echo (2012 echo and 2019 echo).  Feb 2020 EF 20-25%. - mixed ICM/NICM (alcohol and CAD)  October 2020 EF 25-30%. 11/2020 EF 40-45%. 06/2021 EF 45-50%,dil LV+hypokinesis,mod AI,grd I DD.   Chronic systolic (congestive) heart failure 06/01/2019   max med mgmt, plus CRT-D placed 05/2019, EF improved to 50% after. Cardiac MRI 03/2022 EF 40%   Coronary artery disease 11/16/2010   a. 2012 - 80% OM lesion. b. Cath 12/2018 - showing 95% prox RCA, 75% OM1, 50% prox-mid LAD. Unsuccessful PCI to 100% chronically occluded RCA summer 2020. CT angio chest w/aorta 08/2021->severe coronary calc's, no aneurism.  Mild subclav stenosis d/t athero   Diverticulosis    Erectile dysfunction 08/16/2011   Essential hypertension 07/19/2010   GAD (generalized anxiety disorder)    managed by Dr. Toy Care with prozac '20mg'$  qd   Gout    Usually Right great toe; colchicine helps well   Gouty arthritis of toe of left foot 05/27/2013   Habitual alcohol use    History of colonoscopy    Initial was normal; 2014-polypectomy and diverticulosis.  Recall 2019.   Hyperlipidemia 10/23/2012   LBBB (left bundle branch block)    CRT-D placed 05/2019   Low back strain 10/24/2013   Multiple lacunar infarcts 11/15/2021   Right caudate nucleus (acute/subacute 11/2021); remote subcentimeter infarct within the inferior right cerebellar hemisphere   Obesity, Class I, BMI 30-34.9    Pulmonary nodule    0.5 cm RLL subpleural nodule.  Pt NEVER SMOKER.  Low risk->no f/u imaging is needed.   Subclavian artery stenosis, left    mild, detected on CT angio chest w/aorta 08/2021   Thyroid nodule 04/23/2013   Past Surgical History:  Procedure Laterality Date   BIV ICD INSERTION CRT-D N/A 06/01/2019   Procedure: BIV ICD INSERTION CRT-D;  Surgeon: [name], MD;  Location: [facility];  Service: Cardiovascular;  Laterality: N/A;   CARDIAC CATHETERIZATION  10/12/2010   diagnostic only: 1 vessel CAD with TIMI 3 flow, EF 40-45%.  Mild AS.  Medical mgmt of CAD.   COLONOSCOPY     COLONOSCOPY W/ POLYPECTOMY  10/06/2012   Diverticulosis and tubular adenoma w/out high grade dysplasia.  Repeat 5 yrs (Dr. Deatra Ina).   CORONARY ATHERECTOMY N/A 01/08/2019   100% chronic RCA occlusion impossible to clear.  Med mgmt. Procedure: CORONARY ATHERECTOMY;  Surgeon: Sherren Mocha, MD;  Location: [facility];  Service: Cardiovascular;  Laterality: N/A;   POLYPECTOMY     RIGHT HEART CATH AND CORONARY ANGIOGRAPHY N/A 12/24/2018   95% RCA occlusion, 75% obtuse marginal occlusion (3V dz). Procedure: RIGHT HEART CATH AND CORONARY ANGIOGRAPHY;   Surgeon: Lorretta Harp, MD;  Location: [facility];  Service: Cardiovascular;  Laterality: N/A;   TEE WITHOUT CARDIOVERSION N/A 11/15/2019   EF 50-55%, no LV dilation or WMA. Mod AV sclerosis w/out stenosis, mod-to-sev AI.  Procedure: TRANSESOPHAGEAL ECHOCARDIOGRAM (TEE);  Surgeon: Fay Records, MD;  Location: [facility];  Service: Cardiovascular;  Laterality: N/A;   TRANSTHORACIC ECHOCARDIOGRAM  09/2010; 07/2017; 08/2018; 10/2019;05/2020   2012:  EF 40-45%, global hypokinesis, mild AS.  07/2017: no chg except mild/mod AR.  08/2018 EF 20-25%, global LV hypok, DD, biatrial enlargemt, mod AR, aortic root 3.8 cm. 10/2019 EF 50%, mild dec RV fxn, mod-sev AR, aor root dil 42 mm. 05/2020 EF 45-50%,grd I DD, mod AI, aorta 31m, severe LAE. 10/2020 EF 45-50%, mild LVH, global hypokin, grd I DD, mod AI. Echo 12/2021  EF 45-50%, severe AR   Family History  Problem Relation Age of Onset   Congenital heart disease Brother        cardiomyopathy   Alzheimer's disease Mother    COPD Father    Heart failure Father        congestive   Colon cancer Neg Hx    Esophageal cancer Neg Hx    Stomach cancer Neg Hx    Rectal cancer Neg Hx    Colon polyps Neg Hx    Social History   Socioeconomic History   Marital status: Married    Spouse name: Thayer Headings   Number of children: 2   Years of education: 16   Highest education level: Bachelor's degree (e.g., BA, AB, BS)  Occupational History   Occupation: Retired    Fish farm manager: [employer]  Tobacco Use   Smoking status: Former   Smokeless tobacco: Current    Types: Chew   Tobacco comments:    chews tobacco  Vaping Use   Vaping Use: Never used  Substance and Sexual Activity   Alcohol use: Yes    Comment: 9/22 - 28 per week; 1/23 - 12 per week, 06/28/22 no drinks during past week   Drug use: No   Sexual activity: Yes  Other Topics Concern   Not on file  Social History Narrative   Retired Education officer, community for dental lab.  Married, two children.    Pt denies ever smoking cigarettes.   +Alcohol.   Lives in [place].   Right handed   Drinks caffeine   Two story home   Social Determinants of Health   Financial Resource Strain: Low Risk  (09/11/2022)   Overall Financial Resource Strain (CARDIA)    Difficulty of Paying Living Expenses: Not hard at all  Food Insecurity: No Food Insecurity (09/11/2022)   Hunger Vital Sign    Worried About Running Out of Food in the Last Year: Never true    Ran Out of Food in the Last Year: Never true  Transportation Needs: No Transportation Needs (09/11/2022)   PRAPARE - Hydrologist (Medical): No    Lack of Transportation (Non-Medical): No  Physical Activity: Inactive (09/11/2022)   Exercise Vital Sign    Days of Exercise per Week: 0 days    Minutes of Exercise per Session: 0 min  Stress: No Stress Concern Present (09/11/2022)   [place]    Feeling of Stress : Not at all  Social Connections: Moderately Integrated (09/11/2022)   Social Connection and Isolation Panel [NHANES]    Frequency of Communication with Friends and Family: Once a week    Frequency of Social Gatherings with Friends and Family: More than three times a week    Attends Religious Services: 1 to 4 times per year    Active Member of [employer] or Organizations: No    Attends Music therapist: Never    Marital Status: Married    Tobacco Counseling Ready to quit: Not Answered Counseling given: Not Answered Tobacco comments: chews tobacco   Clinical Intake:  Pre-visit preparation completed: Yes  Pain : No/denies pain     BMI - recorded: 28.41 Nutritional Status: BMI 25 -29 Overweight Nutritional Risks: None Diabetes: No  How often do you need to have someone help you when you read instructions, pamphlets, or other written materials from your doctor or pharmacy?: 1 - Never  Diabetic?no  Interpreter Needed?:  No  Information entered by :: Charlott Rakes, LPN   Activities of Daily Living    09/11/2022    1:12 PM  In your present state of health, do you have any difficulty performing the following activities:  Hearing? 0  Vision? 0  Difficulty concentrating or making decisions? 0  Walking or climbing stairs? 0  Dressing or bathing? 0  Doing errands, shopping? 0  Preparing Food and eating ? N  Using the Toilet? N  In the past six months, have you accidently leaked urine? N  Do you have problems with loss of bowel control? N  Managing your Medications? N  Managing your Finances? N  Housekeeping or managing your Housekeeping? N    Patient Care Team: Tammi Sou, MD as PCP - General Sueanne Margarita, MD as PCP - Cardiology (Cardiology) [name], MD (Inactive) as PCP - Electrophysiology (Cardiology) Inda Castle, MD (Inactive) as Consulting Physician (Gastroenterology) Chucky May, MD as Consulting Physician (Psychiatry) Marlyce Huge (Dentistry)  Indicate any recent Medical Services you may have received from other than [facility] providers in the past year (date may be approximate).     Assessment:   This is a routine wellness examination for Micai.  Hearing/Vision screen Hearing Screening - Comments:: Pt denies any hearing issues  Vision Screening - Comments:: Pt follows up with  Dr Truman Hayward for annual eye exams   Dietary issues and exercise activities discussed: Current Exercise Habits: The patient does not participate in regular exercise at present   Goals Addressed             This Visit's Progress    Patient Stated       None at this time        Depression Screen    09/11/2022    1:10 PM 08/29/2021    2:43 PM 08/23/2020    3:05 PM 09/21/2019   10:35 AM 08/03/2018    9:55 AM 01/30/2018    9:23 AM 08/01/2017    8:56 AM  PHQ 2/9 Scores  PHQ - 2 Score 0 0 0 0 0 0 0  PHQ- 9 Score      0     Fall Risk    09/11/2022    1:12 PM 06/19/2022   12:58 PM 12/13/2021    11:42 AM 11/14/2021   11:00 AM 09/11/2021    1:12 PM  [place] in the past year? 0 0 0 0 0  Number falls in past yr: 0 0 0 0 0  Injury with Fall? 0 0 0 0 0  Risk for fall due to : Impaired vision      Follow up Falls prevention discussed Falls evaluation completed       FALL RISK PREVENTION PERTAINING TO THE HOME:  Any stairs in or around the home? Yes  If so, are there any without handrails? No  Home free of loose throw rugs in walkways, pet beds, electrical cords, etc? Yes  Adequate lighting in your home to reduce risk of falls? Yes   ASSISTIVE DEVICES UTILIZED TO PREVENT FALLS:  Life alert? Yes  Use of a cane, walker or w/c? No  Grab bars in the bathroom? Yes  Shower chair or bench in shower? Yes  Elevated toilet seat or a handicapped toilet? No   TIMED UP AND GO:  Was the test performed? No .   Cognitive Function: see note below    12/13/2021   12:00 PM 08/01/2017  8:[place] Exam  Orientation to time 5 5  Orientation to Place 5 5  Registration 3 3  Attention/ Calculation 5 3  Recall 2 3  Language- name 2 objects 2 2  Language- repeat 1 1  Language- follow 3 step command 3 3  Language- read & follow direction 1 1  Write a sentence 1 1  Copy design 1 1  Total score 29 28        08/29/2021    2:47 PM  6CIT Screen  What Year? 4 points  What month? 0 points  What time? 0 points  Count back from 20 0 points  Months in reverse 0 points  Repeat phrase 4 points  Total Score 8 points    Immunizations Immunization History  Administered Date(s) Administered   Fluad Quad(high Dose 65+) 03/23/2019, 04/03/2021, 04/11/2022   Influenza,inj,Quad PF,6+ Mos 04/23/2013   Influenza-Unspecified 04/21/2015, 04/11/2017, 05/01/2018, 04/17/2020   Moderna Covid-19 Vaccine Bivalent Booster 43yr & up 04/03/2021   Moderna Sars-Covid-2 Vaccination 08/16/2019, 09/14/2019, 05/16/2020, 12/05/2020   Pneumococcal Conjugate-13 07/22/2014    Pneumococcal Polysaccharide-23 08/16/2011, 12/13/2016   Respiratory Syncytial Virus Vaccine,Recomb Aduvanted(Arexvy) 04/11/2022   Td 07/16/2007   Tdap 08/26/2017   Unspecified SARS-COV-2 Vaccination 05/14/2022   Zoster Recombinat (Shingrix) 02/25/2017, 04/11/2017    TDAP status: Up to date  Flu Vaccine status: Up to date  Pneumococcal vaccine status: Up to date  Covid-19 vaccine status: Completed vaccines  Qualifies for Shingles Vaccine? Yes   Zostavax completed Yes   Shingrix Completed?: Yes  Screening Tests Health Maintenance  Topic Date Due   COLONOSCOPY (Pts 45-473yrInsurance coverage will need to be confirmed)  10/09/2017   COVID-19 Vaccine (7 - 2023-24 season) 07/09/2022   Medicare Annual Wellness (AWV)  09/12/2023   DTaP/Tdap/Td (3 - Td or Tdap) 08/27/2027   Pneumonia Vaccine 6525Years old  Completed   INFLUENZA VACCINE  Completed   Hepatitis C Screening  Completed   Zoster Vaccines- Shingrix  Completed   HPV VACCINES  Aged Out    Health Maintenance  Health Maintenance Due  Topic Date Due   COLONOSCOPY (Pts 45-4925yrnsurance coverage will need to be confirmed)  10/09/2017   COVID-19 Vaccine (7 - 2023-24 season) 07/09/2022    Colorectal cancer screening: Referral to GI placed 09/11/22. Pt aware the office will call re: appt.  Additional Screening:  Hepatitis C Screening: Completed 03/18/14  Vision Screening: Recommended annual ophthalmology exams for early detection of glaucoma and other disorders of the eye. Is the patient up to date with their annual eye exam?  Yes  Who is the provider or what is the name of the office in which the patient attends annual eye exams? Dr LeeTruman Hayward pt is not established with a provider, would they like to be referred to a provider to establish care? No .   Dental Screening: Recommended annual dental exams for proper oral hygiene  Community Resource Referral / Chronic Care Management: CRR required this visit?  No   CCM  required this visit?  No      Plan:     I have personally reviewed and noted the following in the patient's chart:   Medical and social history Use of alcohol, tobacco or illicit drugs  Current medications and supplements including opioid prescriptions. Patient is not currently taking opioid prescriptions. Functional ability and status Nutritional status Physical activity Advanced directives List of other physicians Hospitalizations, surgeries, and ER visits in previous  12 months Vitals Screenings to include cognitive, depression, and falls Referrals and appointments  In addition, I have reviewed and discussed with patient certain preventive protocols, quality metrics, and best practice recommendations. A written personalized care plan for preventive services as well as general preventive health recommendations were provided to patient.     Willette Brace, LPN   [id number]   Nurse Notes: Pt declined cognition test at this time , pt was knowledgeable to questions asked

## 2022-09-13 NOTE — Progress Notes (Signed)
EPIC Encounter for ICM Monitoring  Patient Name: Anthony Daugherty is a 77 y.o. male Date: 09/13/2022 Primary Care Physican: Tammi Sou, MD Primary Cardiologist: Radford Pax Electrophysiologist: Cyndi Bender Pacing: >99%         11/14/2021 Office Weight: 160 lbs   AT/AF Burden: <1%                                                            Transmission reviewed.    CorVue thoracic impedance suggesting intermittent days with possible fluid accumulation within the last month.   Prescribed: Spironolactone 25 mg take 0.5 tablet daily.   Labs: 10/30/2021 Creatinine 0.86, BUN 10, Potassium 3.7, Sodium 141, GFR 90 10/23/2021 Creatinine 0.98, BUN 11, Potassium 3.1, Sodium 142, GFR 80  10/04/2021 Creatinine 0.87, BUN 14, Potassium 3.7, Sodium 140  09/04/2021 Creatinine 0.96, BUN 26, Potassium 4.2, Sodium 140, GFR 82 A complete set of results can be found in Results Review.   Recommendations:  No changes.   Follow-up plan: ICM clinic phone appointment on 10/14/2022   91 day device clinic remote transmission 10/07/2022.   EP/Cardiology Office Visits: 10/10/2022 with Dr Myles Gip.  03/06/2023 with Dr Radford Pax.   Copy of ICM check sent to Dr. Curt Bears.   3 month ICM trend: 09/09/2022.    12-14 Month ICM trend:     Rosalene Billings, RN 09/13/2022 3:10 PM

## 2022-09-14 ENCOUNTER — Other Ambulatory Visit: Payer: Self-pay | Admitting: Cardiology

## 2022-09-21 ENCOUNTER — Other Ambulatory Visit: Payer: Self-pay | Admitting: Cardiology

## 2022-10-04 ENCOUNTER — Other Ambulatory Visit: Payer: Self-pay | Admitting: Cardiology

## 2022-10-04 MED ORDER — EMPAGLIFLOZIN 10 MG PO TABS: 10.0000 mg | ORAL_TABLET | Freq: Every day | ORAL | 3 refills | Status: DC

## 2022-10-04 NOTE — Patient Instructions (Signed)
Health Maintenance, Male Adopting a healthy lifestyle and getting preventive care are important in promoting health and wellness. Ask your health care provider about: The right schedule for you to have regular tests and exams. Things you can do on your own to prevent diseases and keep yourself healthy. What should I know about diet, weight, and exercise? Eat a healthy diet  Eat a diet that includes plenty of vegetables, fruits, low-fat dairy products, and lean protein. Do not eat a lot of foods that are high in solid fats, added sugars, or sodium. Maintain a healthy weight Body mass index (BMI) is a measurement that can be used to identify possible weight problems. It estimates body fat based on height and weight. Your health care provider can help determine your BMI and help you achieve or maintain a healthy weight. Get regular exercise Get regular exercise. This is one of the most important things you can do for your health. Most adults should: Exercise for at least 150 minutes each week. The exercise should increase your heart rate and make you sweat (moderate-intensity exercise). Do strengthening exercises at least twice a week. This is in addition to the moderate-intensity exercise. Spend less time sitting. Even light physical activity can be beneficial. Watch cholesterol and blood lipids Have your blood tested for lipids and cholesterol at 76 years of age, then have this test every 5 years. You may need to have your cholesterol levels checked more often if: Your lipid or cholesterol levels are high. You are older than 76 years of age. You are at high risk for heart disease. What should I know about cancer screening? Many types of cancers can be detected early and may often be prevented. Depending on your health history and family history, you may need to have cancer screening at various ages. This may include screening for: Colorectal cancer. Prostate cancer. Skin cancer. Lung  cancer. What should I know about heart disease, diabetes, and high blood pressure? Blood pressure and heart disease High blood pressure causes heart disease and increases the risk of stroke. This is more likely to develop in people who have high blood pressure readings or are overweight. Talk with your health care provider about your target blood pressure readings. Have your blood pressure checked: Every 3-5 years if you are 18-39 years of age. Every year if you are 40 years old or older. If you are between the ages of 65 and 75 and are a current or former smoker, ask your health care provider if you should have a one-time screening for abdominal aortic aneurysm (AAA). Diabetes Have regular diabetes screenings. This checks your fasting blood sugar level. Have the screening done: Once every three years after age 45 if you are at a normal weight and have a low risk for diabetes. More often and at a younger age if you are overweight or have a high risk for diabetes. What should I know about preventing infection? Hepatitis B If you have a higher risk for hepatitis B, you should be screened for this virus. Talk with your health care provider to find out if you are at risk for hepatitis B infection. Hepatitis C Blood testing is recommended for: Everyone born from 1945 through 1965. Anyone with known risk factors for hepatitis C. Sexually transmitted infections (STIs) You should be screened each year for STIs, including gonorrhea and chlamydia, if: You are sexually active and are younger than 76 years of age. You are older than 76 years of age and your   health care provider tells you that you are at risk for this type of infection. Your sexual activity has changed since you were last screened, and you are at increased risk for chlamydia or gonorrhea. Ask your health care provider if you are at risk. Ask your health care provider about whether you are at high risk for HIV. Your health care provider  may recommend a prescription medicine to help prevent HIV infection. If you choose to take medicine to prevent HIV, you should first get tested for HIV. You should then be tested every 3 months for as long as you are taking the medicine. Follow these instructions at home: Alcohol use Do not drink alcohol if your health care provider tells you not to drink. If you drink alcohol: Limit how much you have to 0-2 drinks a day. Know how much alcohol is in your drink. In the [place] one drink equals one 12 oz bottle of beer (355 mL), one 5 oz glass of wine (148 mL), or one 1 oz glass of hard liquor (44 mL). Lifestyle Do not use any products that contain nicotine or tobacco. These products include cigarettes, chewing tobacco, and vaping devices, such as e-cigarettes. If you need help quitting, ask your health care provider. Do not use street drugs. Do not share needles. Ask your health care provider for help if you need support or information about quitting drugs. General instructions Schedule regular health, dental, and eye exams. Stay current with your vaccines. Tell your health care provider if: You often feel depressed. You have ever been abused or do not feel safe at home. Summary Adopting a healthy lifestyle and getting preventive care are important in promoting health and wellness. Follow your health care provider's instructions about healthy diet, exercising, and getting tested or screened for diseases. Follow your health care provider's instructions on monitoring your cholesterol and blood pressure. This information is not intended to replace advice given to you by your health care provider. Make sure you discuss any questions you have with your health care provider. Document Revised: 11/20/2020 Document Reviewed: 11/20/2020 Elsevier Patient Education  2023 [employer]  

## 2022-10-07 ENCOUNTER — Ambulatory Visit: Payer: Medicare PPO | Attending: Family Medicine

## 2022-10-09 ENCOUNTER — Encounter: Payer: Self-pay | Admitting: Family Medicine

## 2022-10-09 ENCOUNTER — Ambulatory Visit (INDEPENDENT_AMBULATORY_CARE_PROVIDER_SITE_OTHER): Payer: Medicare PPO | Admitting: Family Medicine

## 2022-10-09 VITALS — BP 123/74 | HR 64 | Temp 98.4°F | Ht 66.0 in | Wt 177.6 lb

## 2022-10-09 DIAGNOSIS — E538 Deficiency of other specified B group vitamins: Secondary | ICD-10-CM | POA: Diagnosis not present

## 2022-10-09 DIAGNOSIS — Z0000 Encounter for general adult medical examination without abnormal findings: Secondary | ICD-10-CM

## 2022-10-09 DIAGNOSIS — I5022 Chronic systolic (congestive) heart failure: Secondary | ICD-10-CM

## 2022-10-09 LAB — CBC
HCT: 43.5 % (ref 39.0–52.0)
Hemoglobin: 14.9 g/dL (ref 13.0–17.0)
MCHC: 34.3 g/dL (ref 30.0–36.0)
MCV: 92.1 fl (ref 78.0–100.0)
Platelets: 205 10*3/uL (ref 150.0–400.0)
RBC: 4.73 Mil/uL (ref 4.22–5.81)
RDW: 14 % (ref 11.5–15.5)
WBC: 5 10*3/uL (ref 4.0–10.5)

## 2022-10-09 LAB — COMPREHENSIVE METABOLIC PANEL
ALT: 16 U/L (ref 0–53)
AST: 17 U/L (ref 0–37)
Albumin: 4.3 g/dL (ref 3.5–5.2)
Alkaline Phosphatase: 77 U/L (ref 39–117)
BUN: 11 mg/dL (ref 6–23)
CO2: 28 mEq/L (ref 19–32)
Calcium: 8.9 mg/dL (ref 8.4–10.5)
Chloride: 105 mEq/L (ref 96–112)
Creatinine, Ser: 0.92 mg/dL (ref 0.40–1.50)
GFR: 81.03 mL/min (ref >60.00)
Glucose, Bld: 85 mg/dL (ref 70–99)
Potassium: 4.1 mEq/L (ref 3.5–5.1)
Sodium: 139 mEq/L (ref 135–145)
Total Bilirubin: 1.1 mg/dL (ref 0.2–1.2)
Total Protein: 6.4 g/dL (ref 6.0–8.3)

## 2022-10-09 LAB — B12 AND FOLATE PANEL
Folate: >23.8 ng/mL (ref >5.9)
Vitamin B-12: 196 pg/mL — ABNORMAL LOW (ref 211–911)

## 2022-10-09 MED ORDER — POTASSIUM CHLORIDE CRYS ER 20 MEQ PO TBCR
20.0000 meq | EXTENDED_RELEASE_TABLET | Freq: Every day | ORAL | 1 refills | Status: AC
Start: 2022-10-09 — End: ?

## 2022-10-09 NOTE — Progress Notes (Signed)
Office Note 10/09/2022  CC:  Chief Complaint  Patient presents with   Annual Exam    Pt is fasting   Patient is a 77 y.o. male who is here accompanied by his wife for annual health maintenance exam and 37-month follow-up anger control problems,. A/P as of last visit: "#1 anger control problems. Seems to be doing better with Prozac 20 mg a day.  Continue this.   #2 memory impairment. Question vascular plus contribution by alcohol. Question Alzheimer's.   Followed by neurology.  Hard to tell if any improvement with donepezil.   #3 hyperlipidemia: Atorvastatin 40 mg a day. LDL was 57 about 6 months ago. Not fasting today. Plan lipid panel 6 months.   Hepatic panel today.   #4 habitual alcohol use. He has cut back on this but hard to tell exactly how much. He is on thiamine and folic acid.   #5 severe aortic valve insufficiency and mildly dilated aortic root. He has chronic systolic heart failure. Asymptomatic. Cardiology note dated 04/02/2022 reviewed, ongoing medical management: Aspirin 81 mg a day, atorvastatin 40 mg a day, Jardiance 10 mg a day, Toprol XL 100 mg a day, Entresto 97-103 1 twice daily, and spironolactone 25 mg a day. CBC, electrolytes, and creatinine today."  INTERIM HX: [name] is feeling well. He pedals around and his work should, keeps busy around his property. Not too irritable, mood is fine.  Past Medical History:  Diagnosis Date   Amnestic MCI (mild cognitive impairment with memory loss) 08/09/2021   Aortic insufficiency 10/21/2018   Mild-mod on echo 07/2017.  Moderate 08/2018.  Moderate-severe on echo 04/2019--> TEE 11/15/19 EF 50-55%, no LV dilation or WMA. Mod AV sclerosis w/out stenosis, mod-to-sev AI. 11/2020 moderate AI. severe eccentric by echo 12/2021.  Severe on echo 12/2021->cardiac MRI confirmed   Aortic regurgitation 08/09/2021   Mild-mod on echo 07/2017.  Moderate 08/2018.  Moderate-severe on echo 04/2019--> TEE 11/15/19 EF 50-55%, no LV dilation or  WMA. Mod AV sclerosis w/out stenosis, mod-to-sev AI-.>rpt echo 6 mo. 11/2020 moderate AI   Aortic root dilatation 10/2018   11/17/19 45 mm aortic root at the sinuses of Valsalva-> 52022 echo->40 mm, unchanged on echo 12/2021   Aortic stenosis, mild    mild by echo 12/2021   Arthritis    L-Spine   Cardiac murmur    Systolic and diastolic as of 123456.   Cardiomyopathy 10/12/2010   Global hypokinesis, EF 40-45% by cath and echo (2012 echo and 2019 echo).  Feb 2020 EF 20-25%. - mixed ICM/NICM (alcohol and CAD)  October 2020 EF 25-30%. 11/2020 EF 40-45%. 06/2021 EF 45-50%,dil LV+hypokinesis,mod AI,grd I DD.   Chronic systolic (congestive) heart failure 06/01/2019   max med mgmt, plus CRT-D placed 05/2019, EF improved to 50% after. Cardiac MRI 03/2022 EF 40%   Coronary artery disease 11/16/2010   a. 2012 - 80% OM lesion. b. Cath 12/2018 - showing 95% prox RCA, 75% OM1, 50% prox-mid LAD. Unsuccessful PCI to 100% chronically occluded RCA summer 2020. CT angio chest w/aorta 08/2021->severe coronary calc's, no aneurism. Mild subclav stenosis d/t athero   Diverticulosis    Erectile dysfunction 08/16/2011   Essential hypertension 07/19/2010   GAD (generalized anxiety disorder)    managed by Dr. Toy Care with prozac 20mg  qd   Gout    Usually Right great toe; colchicine helps well   Gouty arthritis of toe of left foot 05/27/2013   Habitual alcohol use    History of colonoscopy    Initial  was normal; 2014-polypectomy and diverticulosis.  Recall 2019.   Hyperlipidemia 10/23/2012   LBBB (left bundle branch block)    CRT-D placed 05/2019   Low back strain 10/24/2013   Multiple lacunar infarcts 11/15/2021   Right caudate nucleus (acute/subacute 11/2021); remote subcentimeter infarct within the inferior right cerebellar hemisphere   Obesity, Class I, BMI 30-34.9    Pulmonary nodule    0.5 cm RLL subpleural nodule.  Pt NEVER SMOKER.  Low risk->no f/u imaging is needed.   Subclavian artery stenosis, left    mild,  detected on CT angio chest w/aorta 08/2021   Thyroid nodule 04/23/2013    Past Surgical History:  Procedure Laterality Date   BIV ICD INSERTION CRT-D N/A 06/01/2019   Procedure: BIV ICD INSERTION CRT-D;  Surgeon: [name], MD;  Location: [place] [facility];  Service: Cardiovascular;  Laterality: N/A;   CARDIAC CATHETERIZATION  10/12/2010   diagnostic only: 1 vessel CAD with TIMI 3 flow, EF 40-45%.  Mild AS.  Medical mgmt of CAD.   COLONOSCOPY     COLONOSCOPY W/ POLYPECTOMY  10/06/2012   Diverticulosis and tubular adenoma w/out high grade dysplasia.  Repeat 5 yrs (Dr. Deatra Ina).   CORONARY ATHERECTOMY N/A 01/08/2019   100% chronic RCA occlusion impossible to clear.  Med mgmt. Procedure: CORONARY ATHERECTOMY;  Surgeon: Sherren Mocha, MD;  Location: [place] [facility];  Service: Cardiovascular;  Laterality: N/A;   POLYPECTOMY     RIGHT HEART CATH AND CORONARY ANGIOGRAPHY N/A 12/24/2018   95% RCA occlusion, 75% obtuse marginal occlusion (3V dz). Procedure: RIGHT HEART CATH AND CORONARY ANGIOGRAPHY;  Surgeon: Lorretta Harp, MD;  Location: [place] [facility];  Service: Cardiovascular;  Laterality: N/A;   TEE WITHOUT CARDIOVERSION N/A 11/15/2019   EF 50-55%, no LV dilation or WMA. Mod AV sclerosis w/out stenosis, mod-to-sev AI.  Procedure: TRANSESOPHAGEAL ECHOCARDIOGRAM (TEE);  Surgeon: Fay Records, MD;  Location: [facility];  Service: Cardiovascular;  Laterality: N/A;   TRANSTHORACIC ECHOCARDIOGRAM  09/2010; 07/2017; 08/2018; 10/2019;05/2020   2012:  EF 40-45%, global hypokinesis, mild AS.  07/2017: no chg except mild/mod AR.  08/2018 EF 20-25%, global LV hypok, DD, biatrial enlargemt, mod AR, aortic root 3.8 cm. 10/2019 EF 50%, mild dec RV fxn, mod-sev AR, aor root dil 42 mm. 05/2020 EF 45-50%,grd I DD, mod AI, aorta 47mm, severe LAE. 10/2020 EF 45-50%, mild LVH, global hypokin, grd I DD, mod AI. Echo 12/2021 EF 45-50%, severe AR    Family History  Problem Relation Age of Onset   Congenital  heart disease Brother        cardiomyopathy   Alzheimer's disease Mother    COPD Father    Heart failure Father        congestive   Colon cancer Neg Hx    Esophageal cancer Neg Hx    Stomach cancer Neg Hx    Rectal cancer Neg Hx    Colon polyps Neg Hx     Social History   Socioeconomic History   Marital status: Married    Spouse name: Thayer Headings   Number of children: 2   Years of education: 16   Highest education level: Bachelor's degree (e.g., BA, AB, BS)  Occupational History   Occupation: Retired    Fish farm manager: [employer]  Tobacco Use   Smoking status: Former   Smokeless tobacco: Current    Types: Chew   Tobacco comments:    chews tobacco  Vaping Use   Vaping Use: Never used  Substance and Sexual Activity   Alcohol use: Yes    Comment: 9/22 - 28 per week; 1/23 - 12 per week, 06/28/22 no drinks during past week   Drug use: No   Sexual activity: Yes  Other Topics Concern   Not on file  Social History Narrative   Retired Education officer, community for [employer].  Married, two children.   Pt denies ever smoking cigarettes.   +Alcohol.   Lives in [place].   Right handed   Drinks caffeine   Two story home   Social Determinants of Health   Financial Resource Strain: Low Risk  (09/11/2022)   Overall Financial Resource Strain (CARDIA)    Difficulty of Paying Living Expenses: Not hard at all  Food Insecurity: No Food Insecurity (09/11/2022)   Hunger Vital Sign    Worried About Running Out of Food in the Last Year: Never true    Ran Out of Food in the Last Year: Never true  Transportation Needs: No Transportation Needs (09/11/2022)   PRAPARE - Hydrologist (Medical): No    Lack of Transportation (Non-Medical): No  Physical Activity: Inactive (09/11/2022)   Exercise Vital Sign    Days of Exercise per Week: 0 days    Minutes of Exercise per Session: 0 min  Stress: No Stress Concern Present (09/11/2022)   [place]    Feeling of Stress : Not at all  Social Connections: Moderately Integrated (09/11/2022)   Social Connection and Isolation Panel [NHANES]    Frequency of Communication with Friends and Family: Once a week    Frequency of Social Gatherings with Friends and Family: More than three times a week    Attends Religious Services: 1 to 4 times per year    Active Member of [employer] or Organizations: No    Attends Archivist Meetings: Never    Marital Status: Married  Human resources officer Violence: Not At Risk (09/11/2022)   Humiliation, Afraid, Rape, and Kick questionnaire    Fear of Current or Ex-Partner: No    Emotionally Abused: No    Physically Abused: No    Sexually Abused: No    Outpatient Medications Prior to Visit  Medication Sig Dispense Refill   aspirin EC 81 MG tablet Take 81 mg by mouth daily. Swallow whole.     atorvastatin (LIPITOR) 40 MG tablet TAKE 1 TABLET DAILY 90 tablet 3   donepezil (ARICEPT) 10 MG tablet Take 1 tablet 10 mg daily 30 tablet 11   empagliflozin (JARDIANCE) 10 MG TABS tablet Take 1 tablet (10 mg total) by mouth daily before breakfast. 90 tablet 3   FLUoxetine (PROZAC) 20 MG capsule Take 1 capsule (20 mg total) by mouth daily. 90 capsule 3   folic acid (FOLVITE) 1 MG tablet Take 1 mg by mouth daily.     metoprolol succinate (TOPROL-XL) 100 MG 24 hr tablet TAKE ONE TABLET BY MOUTH DAILY WITH A MEAL 90 tablet 3   potassium chloride SA (KLOR-CON M) 20 MEQ tablet Take 1 tablet (20 mEq total) by mouth daily. 90 tablet 1   sacubitril-valsartan (ENTRESTO) 97-103 MG TAKE 1 TABLET 2 TIMES A DAY 90 tablet 2   spironolactone (ALDACTONE) 25 MG tablet TAKE ONE TABLET BY MOUTH DAILY 90 tablet 3   thiamine (VITAMIN B-1) 100 MG tablet Take 1 tablet (100 mg total) by mouth daily. 90 tablet 3   No facility-administered medications prior to visit.  Allergies  Allergen Reactions   Penicillins Swelling    Did it involve swelling of the  face/tongue/throat, SOB, or low BP? No Did it involve sudden or severe rash/hives, skin peeling, or any reaction on the inside of your mouth or nose? No Did you need to seek medical attention at a hospital or doctor's office? No When did it last happen? ~10 years ago If all above answers are "NO", may proceed with cephalosporin use.     Review of Systems  Constitutional:  Negative for appetite change, chills, fatigue and fever.  HENT:  Negative for congestion, dental problem, ear pain and sore throat.   Eyes:  Negative for discharge, redness and visual disturbance.  Respiratory:  Negative for cough, chest tightness, shortness of breath and wheezing.   Cardiovascular:  Negative for chest pain, palpitations and leg swelling.  Gastrointestinal:  Negative for abdominal pain, blood in stool, diarrhea, nausea and vomiting.  Genitourinary:  Negative for difficulty urinating, dysuria, flank pain, frequency, hematuria and urgency.  Musculoskeletal:  Negative for arthralgias, back pain, joint swelling, myalgias and neck stiffness.  Skin:  Negative for pallor and rash.  Neurological:  Negative for dizziness, speech difficulty, weakness and headaches.  Hematological:  Negative for adenopathy. Does not bruise/bleed easily.  Psychiatric/Behavioral:  Negative for confusion and sleep disturbance. The patient is not nervous/anxious.     PE;    10/09/2022    2:05 PM 09/11/2022    1:07 PM 09/04/2022   12:58 PM  Vitals with BMI  Height 5\' 6"   5\' 6"   Weight 177 lbs 10 oz 176 lbs 176 lbs  BMI 28.68 Q000111Q Q000111Q  Systolic [id number]  [id number]  Diastolic 74  56  Pulse 64  64   Gen: Alert, well appearing.  Patient is oriented to person, place, time, and situation. AFFECT: pleasant, lucid thought and speech. ENT: Ears: EACs clear, normal epithelium.  TMs with good light reflex and landmarks bilaterally.  Eyes: no injection, icteris, swelling, or exudate.  EOMI, PERRLA. Nose: no drainage or turbinate edema/swelling.   No injection or focal lesion.  Mouth: lips without lesion/swelling.  Oral mucosa pink and moist.  Dentition intact and without obvious caries or gingival swelling.  Oropharynx without erythema, exudate, or swelling.  Neck: supple/nontender.  No LAD, mass, or TM.  Carotid pulses 2+ bilaterally, without bruits. CV: RRR, no m/r/g.   LUNGS: CTA bilat, nonlabored resps, good aeration in all lung fields. ABD: soft, NT, ND, BS normal.  No hepatospenomegaly or mass.  No bruits. EXT: no clubbing, cyanosis, or edema.  Musculoskeletal: no joint swelling, erythema, warmth, or tenderness.  ROM of all joints intact. Skin - no sores or suspicious lesions or rashes or color changes  Pertinent labs:  Lab Results  Component Value Date   TSH 3.45 04/12/2021   Lab Results  Component Value Date   WBC 5.1 04/08/2022   HGB 14.3 04/08/2022   HCT 41.8 04/08/2022   MCV 92.9 04/08/2022   PLT 181.0 04/08/2022   Lab Results  Component Value Date   CREATININE 0.91 04/22/2022   BUN 13 04/22/2022   NA 141 04/22/2022   K 4.0 04/22/2022   CL 107 04/22/2022   CO2 27 04/22/2022   Lab Results  Component Value Date   ALT 15 09/04/2022   AST 11 04/08/2022   ALKPHOS 75 04/08/2022   BILITOT 1.0 04/08/2022   Lab Results  Component Value Date   CHOL 123 09/04/2022   Lab Results  Component Value Date  HDL 47 09/04/2022   Lab Results  Component Value Date   LDLCALC 61 09/04/2022   Lab Results  Component Value Date   TRIG 76 09/04/2022   Lab Results  Component Value Date   CHOLHDL 2.6 09/04/2022   Lab Results  Component Value Date   PSA 2.54 08/01/2017   PSA 1.42 12/08/2015   PSA 1.59 07/22/2014   Lab Results  Component Value Date   FOLATE 12.3 10/04/2021   Lab Results  Component Value Date   VITAMINB12 315 10/04/2021    ASSESSMENT AND PLAN:   #1 health maintenance exam: Reviewed age and gender appropriate health maintenance issues (prudent diet, regular exercise, health risks of  tobacco and excessive alcohol, use of seatbelts, fire alarms in home, use of sunscreen).  Also reviewed age and gender appropriate health screening as well as vaccine recommendations. Vaccines: ALL UTD. Labs: cmet Prostate ca screening: shared decision making process utilized today->no further screening indicated due to age. Colon ca screening: adenoma 2014, is overdue for rpt colonoscopy-->he declines.  #2 mild cognitive impairment, with some chronic mood irritability. He is improved long-term on Prozac 20 mg a day.  An After Visit Summary was printed and given to the patient.  FOLLOW UP:  No follow-ups on file.  Signed:  Crissie Sickles, MD           10/09/2022

## 2022-10-10 ENCOUNTER — Ambulatory Visit: Payer: Medicare PPO | Attending: Cardiovascular Disease | Admitting: Cardiovascular Disease

## 2022-10-10 ENCOUNTER — Encounter: Payer: Self-pay | Admitting: Cardiovascular Disease

## 2022-10-10 VITALS — BP 114/56 | HR 65 | Ht 66.0 in | Wt 179.0 lb

## 2022-10-10 DIAGNOSIS — Z95810 Presence of automatic (implantable) cardiac defibrillator: Secondary | ICD-10-CM | POA: Diagnosis not present

## 2022-10-10 NOTE — Progress Notes (Signed)
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 male who presents today for routine electrophysiology followup.    He has history of CHF with reduced EF, CAD, left bundle branch block and an Abbott BiV ICD placed November 2020. EF recovered partially after CRT placement.  Since last being seen in our clinic, the patient reports doing very well.  Today, he denies symptoms of palpitations, chest pain, shortness of breath,  lower extremity edema, dizziness, presyncope, syncope, or ICD shocks.  The patient is otherwise without complaint today.   Past Medical History:  Diagnosis Date   Amnestic MCI (mild cognitive impairment with memory loss) 08/09/2021   Aortic insufficiency 10/21/2018   Mild-mod on echo 07/2017.  Moderate 08/2018.  Moderate-severe on echo 04/2019--> TEE 11/15/19 EF 50-55%, no LV dilation or WMA. Mod AV sclerosis w/out stenosis, mod-to-sev AI. 11/2020 moderate AI. severe eccentric by echo 12/2021.  Severe on echo 12/2021->cardiac MRI confirmed   Aortic regurgitation 08/09/2021   Mild-mod on echo 07/2017.  Moderate 08/2018.  Moderate-severe on echo 04/2019--> TEE 11/15/19 EF 50-55%, no LV dilation or WMA. Mod AV sclerosis w/out stenosis, mod-to-sev AI-.>rpt echo 6 mo. 11/2020 moderate AI   Aortic root dilatation 10/2018   11/17/19 45 mm aortic root at the sinuses of Valsalva-> 52022 echo->40 mm, unchanged on echo 12/2021   Aortic stenosis, mild    mild by echo 12/2021   Arthritis    L-Spine   Cardiac murmur    Systolic and diastolic as of 123456.   Cardiomyopathy 10/12/2010   Global hypokinesis, EF 40-45% by cath and echo (2012 echo and 2019 echo).  Feb 2020 EF 20-25%. - mixed ICM/NICM (alcohol and CAD)  October 2020 EF 25-30%. 11/2020 EF 40-45%. 06/2021 EF 45-50%,dil LV+hypokinesis,mod AI,grd I DD.   Chronic systolic (congestive) heart failure 06/01/2019   max med mgmt, plus CRT-D placed 05/2019, EF improved to  50% after. Cardiac MRI 03/2022 EF 40%   Coronary artery disease 11/16/2010   a. 2012 - 80% OM lesion. b. Cath 12/2018 - showing 95% prox RCA, 75% OM1, 50% prox-mid LAD. Unsuccessful PCI to 100% chronically occluded RCA summer 2020. CT angio chest w/aorta 08/2021->severe coronary calc's, no aneurism. Mild subclav stenosis d/t athero   Diverticulosis    Erectile dysfunction 08/16/2011   Essential hypertension 07/19/2010   GAD (generalized anxiety disorder)    managed by Dr. Toy Care with prozac 20mg  qd   Gout    Usually Right great toe; colchicine helps well   Gouty arthritis of toe of left foot 05/27/2013   Habitual alcohol use    History of colonoscopy    Initial was normal; 2014-polypectomy and diverticulosis.  Recall 2019.   Hyperlipidemia 10/23/2012   LBBB (left bundle branch block)    CRT-D placed 05/2019   Low back strain 10/24/2013   Multiple lacunar infarcts 11/15/2021   Right caudate nucleus (acute/subacute 11/2021); remote subcentimeter infarct within the inferior right cerebellar hemisphere   Obesity, Class I, BMI 30-34.9    Pulmonary nodule    0.5 cm RLL subpleural nodule.  Pt NEVER SMOKER.  Low risk->no f/u imaging is needed.   Subclavian artery stenosis, left    mild, detected on CT angio chest w/aorta 08/2021   Thyroid nodule 04/23/2013   Past Surgical History:  Procedure Laterality Date   BIV ICD INSERTION CRT-D N/A 06/01/2019   Procedure: BIV ICD INSERTION CRT-D;  Surgeon: [name], MD;  Location: [place] [facility];  Service: Cardiovascular;  Laterality: N/A;   CARDIAC CATHETERIZATION  10/12/2010   diagnostic only: 1 vessel CAD with TIMI 3 flow, EF 40-45%.  Mild AS.  Medical mgmt of CAD.   COLONOSCOPY     COLONOSCOPY W/ POLYPECTOMY  10/06/2012   Diverticulosis and tubular adenoma w/out high grade dysplasia.  Repeat 5 yrs (Dr. Deatra Ina).   CORONARY ATHERECTOMY N/A 01/08/2019   100% chronic RCA occlusion impossible to clear.  Med mgmt. Procedure: CORONARY ATHERECTOMY;   Surgeon: Sherren Mocha, MD;  Location: [place] [facility];  Service: Cardiovascular;  Laterality: N/A;   POLYPECTOMY     RIGHT HEART CATH AND CORONARY ANGIOGRAPHY N/A 12/24/2018   95% RCA occlusion, 75% obtuse marginal occlusion (3V dz). Procedure: RIGHT HEART CATH AND CORONARY ANGIOGRAPHY;  Surgeon: Lorretta Harp, MD;  Location: [place] [facility];  Service: Cardiovascular;  Laterality: N/A;   TEE WITHOUT CARDIOVERSION N/A 11/15/2019   EF 50-55%, no LV dilation or WMA. Mod AV sclerosis w/out stenosis, mod-to-sev AI.  Procedure: TRANSESOPHAGEAL ECHOCARDIOGRAM (TEE);  Surgeon: Fay Records, MD;  Location: [facility];  Service: Cardiovascular;  Laterality: N/A;   TRANSTHORACIC ECHOCARDIOGRAM  09/2010; 07/2017; 08/2018; 10/2019;05/2020   2012:  EF 40-45%, global hypokinesis, mild AS.  07/2017: no chg except mild/mod AR.  08/2018 EF 20-25%, global LV hypok, DD, biatrial enlargemt, mod AR, aortic root 3.8 cm. 10/2019 EF 50%, mild dec RV fxn, mod-sev AR, aor root dil 42 mm. 05/2020 EF 45-50%,grd I DD, mod AI, aorta 56mm, severe LAE. 10/2020 EF 45-50%, mild LVH, global hypokin, grd I DD, mod AI. Echo 12/2021 EF 45-50%, severe AR    ROS- all systems are reviewed and negative except as per HPI above  Current Outpatient Medications  Medication Sig Dispense Refill   aspirin EC 81 MG tablet Take 81 mg by mouth daily. Swallow whole.     atorvastatin (LIPITOR) 40 MG tablet TAKE 1 TABLET DAILY 90 tablet 3   donepezil (ARICEPT) 10 MG tablet Take 1 tablet 10 mg daily 30 tablet 11   empagliflozin (JARDIANCE) 10 MG TABS tablet Take 1 tablet (10 mg total) by mouth daily before breakfast. 90 tablet 3   FLUoxetine (PROZAC) 20 MG capsule Take 1 capsule (20 mg total) by mouth daily. 90 capsule 3   folic acid (FOLVITE) 1 MG tablet Take 1 mg by mouth daily.     metoprolol succinate (TOPROL-XL) 100 MG 24 hr tablet TAKE ONE TABLET BY MOUTH DAILY WITH A MEAL 90 tablet 3   potassium chloride SA (KLOR-CON M) 20 MEQ tablet  Take 1 tablet (20 mEq total) by mouth daily. 90 tablet 1   sacubitril-valsartan (ENTRESTO) 97-103 MG TAKE 1 TABLET 2 TIMES A DAY 90 tablet 2   spironolactone (ALDACTONE) 25 MG tablet TAKE ONE TABLET BY MOUTH DAILY 90 tablet 3   thiamine (VITAMIN B-1) 100 MG tablet Take 1 tablet (100 mg total) by mouth daily. 90 tablet 3   No current facility-administered medications for this visit.    Physical Exam: Vitals:   10/10/22 1455  BP: (!) 114/56  Pulse: 65  SpO2: 98%  Weight: 179 lb (81.2 kg)  Height: 5\' 6"  (1.676 m)    Gen: Appears comfortable, well-nourished CV: RRR, no dependent edema The device site is normal -- no tenderness, edema, drainage, redness, threatened erosion. Pulm: breathing easily   ICD interrogation- reviewed in detail today,  See PACEART report  ekg tracing ordered today is personally reviewed and  shows sinus with BiV pacing  Wt Readings from Last 3 Encounters:  10/10/22 179 lb (81.2 kg)  10/09/22 177 lb 9.6 oz (80.6 kg)  09/11/22 176 lb (79.8 kg)    Assessment and Plan:  1.  Chronic systolic dysfunction/ ischemic CM/ CAD euvolemic today No ischemic symptoms Stable on an appropriate medical regimen Normal ICD function See [facility] report No changes today he is not device dependant today followed in ICM device clinic EF 40% by MRI 02/2022  2. HTN Stable No change required today  Return to see EP APP in a year  Melida Quitter, MD 10/10/2022 3:14 PM

## 2022-10-10 NOTE — Patient Instructions (Signed)
Medication Instructions:  Your physician recommends that you continue on your current medications as directed. Please refer to the Current Medication list given to you today.  *If you need a refill on your cardiac medications before your next appointment, please call your pharmacy*  Follow-Up: At [facility], you and your health needs are our priority.  As part of our continuing mission to provide you with exceptional heart care, we have created designated Provider Care Teams.  These Care Teams include your primary Cardiologist (physician) and Advanced Practice Providers (APPs -  Physician Assistants and Nurse Practitioners) who all work together to provide you with the care you need, when you need it.  Your next appointment:   1 year(s)  Provider:   You may see Augustus E Mealor, MD or one of the following Advanced Practice Providers on your designated Care Team:   Renee Ursuy, PA-C Avyon "Andy" Tillery, PA-C Suzann Riddle, NP     

## 2022-10-15 ENCOUNTER — Other Ambulatory Visit: Payer: Self-pay | Admitting: Family Medicine

## 2022-10-16 NOTE — Telephone Encounter (Signed)
Pt was last seen 3/28 for cpe, please confirm if ok to refill rx?

## 2022-10-21 NOTE — Progress Notes (Signed)
No ICM remote transmission received for 10/14/2022 and next ICM transmission scheduled for 10/28/2022.

## 2022-10-28 ENCOUNTER — Ambulatory Visit: Payer: Medicare PPO | Attending: Cardiology

## 2022-10-28 DIAGNOSIS — I5042 Chronic combined systolic (congestive) and diastolic (congestive) heart failure: Secondary | ICD-10-CM

## 2022-10-28 DIAGNOSIS — Z95810 Presence of automatic (implantable) cardiac defibrillator: Secondary | ICD-10-CM

## 2022-10-30 ENCOUNTER — Telehealth: Payer: Self-pay

## 2022-10-30 NOTE — Telephone Encounter (Signed)
Spoke with wife Anthony Daugherty per [employer].  Advised monitor is showing as disconnected.  She stated the phone is turned off which has the [employer] on it.  Advised the phone should stay on and by the bed so device clinic can receive remote transmissions. Remote transmission received.  See ICM note.

## 2022-10-30 NOTE — Progress Notes (Signed)
EPIC Encounter for ICM Monitoring  Patient Name: Anthony Daugherty is a 77 y.o. male Date: 10/30/2022 Primary Care Physican: Jeoffrey Massed, MD Primary Cardiologist: Mayford Knife Electrophysiologist: Kathreen Cornfield Pacing: >99%         11/14/2021 Office Weight: 160 lbs   AT/AF Burden: <1%                                                            Spoke with wife/patient and heart failure questions reviewed.  Transmission results reviewed.  Pt asymptomatic for fluid accumulation.  Reports feeling well at this time and voices no complaints.     CorVue thoracic impedance suggesting normal fluid levels with exception of possible fluid accumulation from 3/25-4/2.   Prescribed: Potassium 20 mEq take 1 tablet by mouth daily Spironolactone 25 mg take 1 tablet by mouth daily    Labs: 10/09/2022 Creatinine 0.92, BUN 11, Potassium 4.1, Sodium 139, GFR 81.03 A complete set of results can be found in Results Review.   Recommendations:  No changes and encouraged to call if experiencing any fluid symptoms.   Follow-up plan: ICM clinic phone appointment on 12/02/2022.   91 day device clinic remote transmission 01/06/2023.   EP/Cardiology Office Visits:  Recall 10/05/2023 with Dr Nelly Laurence.  03/06/2023 with Dr Mayford Knife.   Copy of ICM check sent to Dr. Elberta Fortis.   3 month ICM trend: 10/30/2022.    12-14 Month ICM trend:     Karie Soda, RN 10/30/2022 11:49 AM

## 2022-12-02 ENCOUNTER — Ambulatory Visit: Payer: Medicare PPO | Attending: Cardiology

## 2022-12-02 DIAGNOSIS — I5042 Chronic combined systolic (congestive) and diastolic (congestive) heart failure: Secondary | ICD-10-CM

## 2022-12-02 DIAGNOSIS — Z95810 Presence of automatic (implantable) cardiac defibrillator: Secondary | ICD-10-CM | POA: Diagnosis not present

## 2022-12-04 NOTE — Progress Notes (Signed)
EPIC Encounter for ICM Monitoring  Patient Name: Anthony Daugherty is a 77 y.o. male Date: 12/04/2022 Primary Care Physican: Jeoffrey Massed, MD Primary Cardiologist: Mayford Knife Electrophysiologist: Kathreen Cornfield Pacing: >99%         11/14/2021 Office Weight: 160 lbs   AT/AF Burden: 0%                                                            Transmission reviewed.    CorVue thoracic impedance suggesting intermittent days with possible fluid accumulation within the last month.   Prescribed: Potassium 20 mEq take 1 tablet by mouth daily Spironolactone 25 mg take 1 tablet by mouth daily    Labs: 10/09/2022 Creatinine 0.92, BUN 11, Potassium 4.1, Sodium 139, GFR 81.03 A complete set of results can be found in Results Review.   Recommendations:  No changes.   Follow-up plan: ICM clinic phone appointment on 01/07/2023.   91 day device clinic remote transmission 01/06/2023.   EP/Cardiology Office Visits:  Recall 10/05/2023 with Dr Nelly Laurence.  03/06/2023 with Dr Mayford Knife.   Copy of ICM check sent to Dr. Elberta Fortis.    3 month ICM trend: 12/02/2022.    12-14 Month ICM trend:     Karie Soda, RN 12/04/2022 3:16 PM

## 2022-12-19 ENCOUNTER — Encounter: Payer: Self-pay | Admitting: Physician Assistant

## 2022-12-19 ENCOUNTER — Ambulatory Visit: Payer: Medicare PPO | Admitting: Physician Assistant

## 2022-12-19 VITALS — BP 110/70 | HR 64 | Resp 18 | Ht 66.0 in | Wt 174.0 lb

## 2022-12-19 DIAGNOSIS — I6381 Other cerebral infarction due to occlusion or stenosis of small artery: Secondary | ICD-10-CM

## 2022-12-19 DIAGNOSIS — G3184 Mild cognitive impairment, so stated: Secondary | ICD-10-CM

## 2022-12-19 MED ORDER — DONEPEZIL HCL 10 MG PO TABS: ORAL_TABLET | ORAL | 3 refills | Status: DC
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
## 2022-12-19 NOTE — Patient Instructions (Signed)
It was a pleasure to see you today at our office.   Recommendations:  Follow up in 6  months Continue taking donepezil 10 mg daily  Continue taking ASA baby and statin  Continue control of mood with primary doctor   Whom to call:  Memory  decline, memory medications: Call our office [phone]   For psychiatric meds, mood meds: Please have your primary care physician manage these medications.   Counseling regarding caregiver distress, including caregiver depression, anxiety and issues regarding community resources, adult day care programs, adult living facilities, or memory care questions:   Feel free to contact Misty Taylor Palladino, Social Worker at [phone]   For assessment of decision of mental capacity and competency:  Call Dr. Michelle Haber, geriatric psychiatrist at [phone]  For guidance in geriatric dementia issues please call [facility] [phone]  For guidance regarding [facility] and if placement were needed at the facility, contact Nicole Reynolds, Social Worker tel: [phone]  If you have any severe symptoms of a stroke, or other severe issues such as confusion,severe chills or fever, etc call 911 or go to the ER as you may need to be evaluated further   Feel free to visit Facebook page " [email or web address]" for tips of how to care for people with memory problems.         RECOMMENDATIONS FOR ALL PATIENTS WITH MEMORY PROBLEMS: 1. Continue to exercise (Recommend 30 minutes of walking everyday, or 3 hours every week) 2. Increase social interactions - continue going to Church and enjoy social gatherings with friends and family 3. Eat healthy, avoid fried foods and eat more fruits and vegetables 4. Maintain adequate blood pressure, blood sugar, and blood cholesterol level. Reducing the risk of stroke and cardiovascular disease also helps promoting better memory. 5. Avoid stressful situations. Live a simple life and avoid aggravations.  Organize your time and prepare for the next day in anticipation. 6. Sleep well, avoid any interruptions of sleep and avoid any distractions in the bedroom that may interfere with adequate sleep quality 7. Avoid sugar, avoid sweets as there is a strong link between excessive sugar intake, diabetes, and cognitive impairment We discussed the Mediterranean diet, which has been shown to help patients reduce the risk of progressive memory disorders and reduces cardiovascular risk. This includes eating fish, eat fruits and green leafy vegetables, nuts like almonds and hazelnuts, walnuts, and also use olive oil. Avoid fast foods and fried foods as much as possible. Avoid sweets and sugar as sugar use has been linked to worsening of memory function.  There is always a concern of gradual progression of memory problems. If this is the case, then we may need to adjust level of care according to patient needs. Support, both to the patient and caregiver, should then be put into place.    FALL PRECAUTIONS: Be cautious when walking. Scan the area for obstacles that may increase the risk of trips and falls. When getting up in the mornings, sit up at the edge of the bed for a few minutes before getting out of bed. Consider elevating the bed at the head end to avoid drop of blood pressure when getting up. Walk always in a well-lit room (use night lights in the walls). Avoid area rugs or power cords from appliances in the middle of the walkways. Use a walker or a cane if necessary and consider physical therapy for balance exercise. Get your eyesight checked regularly.  FINANCIAL OVERSIGHT: Supervision, especially oversight   when making financial decisions or transactions is also recommended.  HOME SAFETY: Consider the safety of the kitchen when operating appliances like stoves, microwave oven, and blender. Consider having supervision and share cooking responsibilities until no longer able to participate in those. Accidents  with firearms and other hazards in the house should be identified and addressed as well.   ABILITY TO BE LEFT ALONE: If patient is unable to contact 911 operator, consider using LifeLine, or when the need is there, arrange for someone to stay with patients. Smoking is a fire hazard, consider supervision or cessation. Risk of wandering should be assessed by caregiver and if detected at any point, supervision and safe proof recommendations should be instituted.  MEDICATION SUPERVISION: Inability to self-administer medication needs to be constantly addressed. Implement a mechanism to ensure safe administration of the medications.   DRIVING: Regarding driving, in patients with progressive memory problems, driving will be impaired. We advise to have someone else do the driving if trouble finding directions or if minor accidents are reported. Independent driving assessment is available to determine safety of driving.   If you are interested in the driving assessment, you can contact the following:  [employer] in [place] [phone]  [facility] [phone]  [facility] [phone]  [facility] [phone] or [phone]   

## 2022-12-31 DIAGNOSIS — N529 Male erectile dysfunction, unspecified: Secondary | ICD-10-CM | POA: Diagnosis not present

## 2022-12-31 DIAGNOSIS — E876 Hypokalemia: Secondary | ICD-10-CM | POA: Diagnosis not present

## 2022-12-31 DIAGNOSIS — I110 Hypertensive heart disease with heart failure: Secondary | ICD-10-CM | POA: Diagnosis not present

## 2022-12-31 DIAGNOSIS — I2510 Atherosclerotic heart disease of native coronary artery without angina pectoris: Secondary | ICD-10-CM | POA: Diagnosis not present

## 2022-12-31 DIAGNOSIS — F03A4 Unspecified dementia, mild, with anxiety: Secondary | ICD-10-CM | POA: Diagnosis not present

## 2022-12-31 DIAGNOSIS — I77819 Aortic ectasia, unspecified site: Secondary | ICD-10-CM | POA: Diagnosis not present

## 2022-12-31 DIAGNOSIS — I739 Peripheral vascular disease, unspecified: Secondary | ICD-10-CM | POA: Diagnosis not present

## 2022-12-31 DIAGNOSIS — I255 Ischemic cardiomyopathy: Secondary | ICD-10-CM | POA: Diagnosis not present

## 2022-12-31 DIAGNOSIS — E785 Hyperlipidemia, unspecified: Secondary | ICD-10-CM | POA: Diagnosis not present

## 2023-01-06 ENCOUNTER — Ambulatory Visit: Payer: Medicare PPO

## 2023-01-06 DIAGNOSIS — I5042 Chronic combined systolic (congestive) and diastolic (congestive) heart failure: Secondary | ICD-10-CM | POA: Diagnosis not present

## 2023-01-09 LAB — CUP PACEART REMOTE DEVICE CHECK
Battery Remaining Longevity: 43 mo
Battery Remaining Percentage: 57 %
Battery Voltage: 2.93 V
Brady Statistic AP VP Percent: 93 %
Brady Statistic AP VS Percent: 1 %
Brady Statistic AS VP Percent: 6.5 %
Brady Statistic AS VS Percent: 1 %
Brady Statistic RA Percent Paced: 93 %
Date Time Interrogation Session: 20240625020028
HighPow Impedance: 73 Ohm
Implantable Lead Connection Status: 753985
Implantable Lead Connection Status: 753985
Implantable Lead Connection Status: 753985
Implantable Lead Implant Date: 20201117
Implantable Lead Implant Date: 20201117
Implantable Lead Implant Date: 20201117
Implantable Lead Location: 753858
Implantable Lead Location: 753859
Implantable Lead Location: 753860
Implantable Pulse Generator Implant Date: 20201117
Lead Channel Impedance Value: 410 Ohm
Lead Channel Impedance Value: 430 Ohm
Lead Channel Impedance Value: 610 Ohm
Lead Channel Pacing Threshold Amplitude: 0.625 V
Lead Channel Pacing Threshold Amplitude: 1.125 V
Lead Channel Pacing Threshold Amplitude: 2.125 V
Lead Channel Pacing Threshold Pulse Width: 0.5 ms
Lead Channel Pacing Threshold Pulse Width: 0.5 ms
Lead Channel Pacing Threshold Pulse Width: 0.5 ms
Lead Channel Sensing Intrinsic Amplitude: 11.8 mV
Lead Channel Sensing Intrinsic Amplitude: 2.1 mV
Lead Channel Setting Pacing Amplitude: 1.625
Lead Channel Setting Pacing Amplitude: 1.625
Lead Channel Setting Pacing Amplitude: 3.625
Lead Channel Setting Pacing Pulse Width: 0.5 ms
Lead Channel Setting Pacing Pulse Width: 0.5 ms
Lead Channel Setting Sensing Sensitivity: 0.5 mV
Pulse Gen Serial Number: 111012743
Zone Setting Status: 755011

## 2023-01-10 ENCOUNTER — Ambulatory Visit: Payer: Medicare PPO | Attending: Cardiology

## 2023-01-10 DIAGNOSIS — Z95810 Presence of automatic (implantable) cardiac defibrillator: Secondary | ICD-10-CM | POA: Diagnosis not present

## 2023-01-10 DIAGNOSIS — I5042 Chronic combined systolic (congestive) and diastolic (congestive) heart failure: Secondary | ICD-10-CM

## 2023-01-10 NOTE — Progress Notes (Signed)
EPIC Encounter for ICM Monitoring  Patient Name: Anthony Daugherty is a 77 y.o. male Date: 01/10/2023 Primary Care Physican: Jeoffrey Massed, MD Primary Cardiologist: Mayford Knife Electrophysiologist: Kathreen Cornfield Pacing: >99%         11/14/2021 Office Weight: 160 lbs   AT/AF Burden: 0%                                                            Transmission reviewed.    CorVue thoracic impedance suggesting normal fluid levels within the last month.   Prescribed: Potassium 20 mEq take 1 tablet by mouth daily Spironolactone 25 mg take 1 tablet by mouth daily    Labs: 10/09/2022 Creatinine 0.92, BUN 11, Potassium 4.1, Sodium 139, GFR 81.03 A complete set of results can be found in Results Review.   Recommendations:  No changes.   Follow-up plan: ICM clinic phone appointment on 02/10/2023.   91 day device clinic remote transmission 04/07/2023.   EP/Cardiology Office Visits:  Recall 10/05/2023 with Dr Nelly Laurence.  03/06/2023 with Dr Mayford Knife.   Copy of ICM check sent to Dr. Elberta Fortis.      3 month ICM trend: 01/07/2023.    12-14 Month ICM trend:     Karie Soda, RN 01/10/2023 4:12 PM

## 2023-01-27 NOTE — Progress Notes (Signed)
 Remote ICD transmission.   

## 2023-02-12 NOTE — Progress Notes (Signed)
No ICM remote transmission received for 02/10/2023 and next ICM transmission scheduled for 03/03/2023.

## 2023-02-14 DIAGNOSIS — Z9189 Other specified personal risk factors, not elsewhere classified: Secondary | ICD-10-CM | POA: Diagnosis not present

## 2023-03-03 ENCOUNTER — Ambulatory Visit: Payer: Medicare PPO | Attending: Cardiology

## 2023-03-03 DIAGNOSIS — I5042 Chronic combined systolic (congestive) and diastolic (congestive) heart failure: Secondary | ICD-10-CM | POA: Diagnosis not present

## 2023-03-03 DIAGNOSIS — Z95810 Presence of automatic (implantable) cardiac defibrillator: Secondary | ICD-10-CM

## 2023-03-05 NOTE — Progress Notes (Signed)
EPIC Encounter for ICM Monitoring  Patient Name: Anthony Daugherty is a 77 y.o. male Date: 03/05/2023 Primary Care Physican: Jeoffrey Massed, MD Primary Cardiologist: Mayford Knife Electrophysiologist: Kathreen Cornfield Pacing: >99%         11/14/2021 Office Weight: 160 lbs   AT/AF Burden: 0%                                                            Transmission reviewed.    CorVue thoracic impedance suggesting normal fluid levels within the last month.   Prescribed: Potassium 20 mEq take 1 tablet by mouth daily Spironolactone 25 mg take 1 tablet by mouth daily    Labs: 10/09/2022 Creatinine 0.92, BUN 11, Potassium 4.1, Sodium 139, GFR 81.03 A complete set of results can be found in Results Review.   Recommendations:  No changes.   Follow-up plan: ICM clinic phone appointment on 04/08/2023.   91 day device clinic remote transmission 04/07/2023.   EP/Cardiology Office Visits:  Recall 10/05/2023 with Dr Nelly Laurence.  03/06/2023 with Dr Mayford Knife.   Copy of ICM check sent to Dr. Elberta Fortis.    3 month ICM trend: 03/03/2023.    12-14 Month ICM trend:     Karie Soda, RN 03/05/2023 3:02 PM

## 2023-03-06 ENCOUNTER — Ambulatory Visit: Payer: Medicare PPO | Attending: Cardiology | Admitting: Cardiology

## 2023-03-06 ENCOUNTER — Encounter: Payer: Self-pay | Admitting: Cardiology

## 2023-03-06 VITALS — BP 98/66 | HR 65 | Ht 66.0 in | Wt 172.2 lb

## 2023-03-06 DIAGNOSIS — E7800 Pure hypercholesterolemia, unspecified: Secondary | ICD-10-CM

## 2023-03-06 DIAGNOSIS — I5042 Chronic combined systolic (congestive) and diastolic (congestive) heart failure: Secondary | ICD-10-CM

## 2023-03-06 DIAGNOSIS — I359 Nonrheumatic aortic valve disorder, unspecified: Secondary | ICD-10-CM | POA: Diagnosis not present

## 2023-03-06 DIAGNOSIS — I10 Essential (primary) hypertension: Secondary | ICD-10-CM | POA: Diagnosis not present

## 2023-03-06 DIAGNOSIS — Z79899 Other long term (current) drug therapy: Secondary | ICD-10-CM

## 2023-03-06 DIAGNOSIS — I2510 Atherosclerotic heart disease of native coronary artery without angina pectoris: Secondary | ICD-10-CM | POA: Diagnosis not present

## 2023-03-06 DIAGNOSIS — I77810 Thoracic aortic ectasia: Secondary | ICD-10-CM | POA: Diagnosis not present

## 2023-03-06 DIAGNOSIS — I255 Ischemic cardiomyopathy: Secondary | ICD-10-CM

## 2023-03-06 MED ORDER — METOPROLOL SUCCINATE ER 25 MG PO TB24: 75.0000 mg | ORAL_TABLET | Freq: Every day | ORAL | 3 refills | Status: DC

## 2023-03-06 NOTE — Progress Notes (Signed)
Date:  03/06/2023   ID:  Anthony Daugherty, DOB 1945/08/10, MRN [id number]  Patient Location:  [facility]  Provider location:   [place]  PCP:  Jeoffrey Massed, MD  Cardiologist:  Armanda Magic, MD  Electrophysiologist:  Maurice Small, MD   Chief Complaint:  DCM and AI  History of Present Illness: He   Anthony Daugherty is a 77 y.o. male  with a history of aortic stenosis and AR with dilated aortic root.  He apparently had an echo 07/2017 showing mild aortic stenosis and mild to moderate AR and aortic root 43mm.  He also has a history of HTN, hyperlipidemia, nonischemic DCM with EF 40-45 by cath and echo in 2012 and ASCAD with 80% OM lesion medically managed.    Repeat echo 08/2018 showed moderate AR with aortic root 38mm.  Unfortunately his echo also shows that his LVF has significantly declined with EF 20-25% with mildly dilated LV and diffuse HK.  There was severe LAE and moderate RAE and mild pulmonary HTN. At his initial OV in 10/2018, his Bystolic was changed to Carvedilol and he was continued on ARB.    He underwent cath 12/2018 showing 95% pRCA, 75% OM1 and 50% prox to mid LAD.  He underwent attempted PCI of the RCA but unable to cross the lesion and medical therapy was recommended.  It was felt that his CM was mixed ICM/NICM with possible ETOH contribution as well.  He has seen my extenders several times trying to titrate HF meds but has been limited by soft Bps.  He was on Entresto 49/51mg  BID, Carvedilol 25mg  BID and repeat echo 04/2019 showed EF 25-30% with mild AS and moderate to severe AI.  There was no M spike on SPEP/UPEP and TSH and ferritin were normal.  He was referred to EP and underwent [employer] BiV ICD on 06/01/2019.    2D echo 05/2020 showed improved LVF after BiVICD with EF 45-50%, mild AS and moderate AR, mild MR. He was seen by Dr. Excell Seltzer in June 2022 for moderate to severe aortic insufficiency with mildly dilated aortic root.  He had a repeat cardiac MRI MRA  showing EF 45 to 50% with severe aortic insufficiency an holo diastolic flow reversal in the descending aorta.  Dr. Excell Seltzer saw him back in consultation and given the fact that he was asymptomatic, LV dimensions were normal and given his significant dementia that he had developed and the fact that the patient did not want surgery he felt patient should be followed going forward with conservative management with no plans for any type of invasive procedure on his aortic valve.  He is here today for followup and is doing well.  He denies any chest pain or pressure, SOB, DOE, PND, orthopnea, LE edema, dizziness, palpitations or syncope. He is compliant with his meds and is tolerating meds with no SE.  His wife is with him today and states that his dementia has not improved since he saw Dr. Excell Seltzer and he continues to lose weight.   Prior CV studies:   The following studies were reviewed today:  2D echo 12/2021 IMPRESSIONS   1. Left ventricular ejection fraction, by estimation, is 45 to 50%. Left  ventricular ejection fraction by 3D volume is 46 %. The left ventricle has  mildly decreased function. The left ventricle demonstrates global  hypokinesis. The left ventricular  internal cavity size was mildly dilated. There is mild left ventricular  hypertrophy. Left ventricular diastolic parameters  are consistent with  Grade I diastolic dysfunction (impaired relaxation).   2. Right ventricular systolic function is normal. The right ventricular  size is normal. There is normal pulmonary artery systolic pressure. The  estimated right ventricular systolic pressure is 31.7 mmHg.   3. Left atrial size was mildly dilated.   4. The mitral valve is grossly normal. Mild mitral valve regurgitation.  No evidence of mitral stenosis.   5. The aortic valve is abnormal. There is moderate calcification of the  aortic valve. Aortic valve regurgitation is severe, eccentric and  posteriorly directed. Holodiastolic flow  reversal noted in descending  aorta Doppler. Mild aortic valve stenosis.  Aortic valve mean gradient measures 10.7 mmHg.   6. Aortic dilatation noted. There is mild dilatation of the aortic root,  measuring 40 mm.   7. The inferior vena cava is normal in size with greater than 50%  respiratory variability, suggesting right atrial pressure of 3 mmHg.   Past Medical History:  Diagnosis Date   Amnestic MCI (mild cognitive impairment with memory loss) 08/09/2021   Aortic insufficiency 10/21/2018   Mild-mod on echo 07/2017.  Moderate 08/2018.  Moderate-severe on echo 04/2019--> TEE 11/15/19 EF 50-55%, no LV dilation or WMA. Mod AV sclerosis w/out stenosis, mod-to-sev AI. 11/2020 moderate AI. severe eccentric by echo 12/2021.  Severe on echo 12/2021->cardiac MRI confirmed   Aortic regurgitation 08/09/2021   Mild-mod on echo 07/2017.  Moderate 08/2018.  Moderate-severe on echo 04/2019--> TEE 11/15/19 EF 50-55%, no LV dilation or WMA. Mod AV sclerosis w/out stenosis, mod-to-sev AI-.>rpt echo 6 mo. 11/2020 moderate AI   Aortic root dilatation 10/2018   11/17/19 45 mm aortic root at the sinuses of Valsalva-> 73220 echo->40 mm, unchanged on echo 12/2021   Aortic stenosis, mild    mild by echo 12/2021   Arthritis    L-Spine   Cardiac murmur    Systolic and diastolic as of 07/2017.   Cardiomyopathy 10/12/2010   Global hypokinesis, EF 40-45% by cath and echo (2012 echo and 2019 echo).  Feb 2020 EF 20-25%. - mixed ICM/NICM (alcohol and CAD)  October 2020 EF 25-30%. 11/2020 EF 40-45%. 06/2021 EF 45-50%,dil LV+hypokinesis,mod AI,grd I DD.   Chronic systolic (congestive) heart failure 06/01/2019   max med mgmt, plus CRT-D placed 05/2019, EF improved to 50% after. Cardiac MRI 03/2022 EF 40%   Coronary artery disease 11/16/2010   a. 2012 - 80% OM lesion. b. Cath 12/2018 - showing 95% prox RCA, 75% OM1, 50% prox-mid LAD. Unsuccessful PCI to 100% chronically occluded RCA summer 2020. CT angio chest w/aorta 08/2021->severe coronary  calc's, no aneurism. Mild subclav stenosis d/t athero   Diverticulosis    Erectile dysfunction 08/16/2011   Essential hypertension 07/19/2010   GAD (generalized anxiety disorder)    managed by Dr. Evelene Croon with prozac 20mg  qd   Gout    Usually Right great toe; colchicine helps well   Gouty arthritis of toe of left foot 05/27/2013   Habitual alcohol use    History of colonoscopy    Initial was normal; 2014-polypectomy and diverticulosis.  Recall 2019.   Hyperlipidemia 10/23/2012   LBBB (left bundle branch block)    CRT-D placed 05/2019   Low back strain 10/24/2013   Multiple lacunar infarcts 11/15/2021   Right caudate nucleus (acute/subacute 11/2021); remote subcentimeter infarct within the inferior right cerebellar hemisphere   Obesity, Class I, BMI 30-34.9    Pulmonary nodule    0.5 cm RLL subpleural nodule.  Pt NEVER SMOKER.  Low risk->no f/u imaging is needed.   Subclavian artery stenosis, left    mild, detected on CT angio chest w/aorta 08/2021   Thyroid nodule 04/23/2013   Past Surgical History:  Procedure Laterality Date   BIV ICD INSERTION CRT-D N/A 06/01/2019   Procedure: BIV ICD INSERTION CRT-D;  Surgeon: Hillis Range, MD;  Location: [facility] INVASIVE CV LAB;  Service: Cardiovascular;  Laterality: N/A;   CARDIAC CATHETERIZATION  10/12/2010   diagnostic only: 1 vessel CAD with TIMI 3 flow, EF 40-45%.  Mild AS.  Medical mgmt of CAD.   COLONOSCOPY     COLONOSCOPY W/ POLYPECTOMY  10/06/2012   Diverticulosis and tubular adenoma w/out high grade dysplasia.  Repeat 5 yrs (Dr. Arlyce Dice).   CORONARY ATHERECTOMY N/A 01/08/2019   100% chronic RCA occlusion impossible to clear.  Med mgmt. Procedure: CORONARY ATHERECTOMY;  Surgeon: Tonny Bollman, MD;  Location: [facility] INVASIVE CV LAB;  Service: Cardiovascular;  Laterality: N/A;   POLYPECTOMY     RIGHT HEART CATH AND CORONARY ANGIOGRAPHY N/A 12/24/2018   95% RCA occlusion, 75% obtuse marginal occlusion (3V dz). Procedure: RIGHT HEART CATH AND  CORONARY ANGIOGRAPHY;  Surgeon: Runell Gess, MD;  Location: [facility] INVASIVE CV LAB;  Service: Cardiovascular;  Laterality: N/A;   TEE WITHOUT CARDIOVERSION N/A 11/15/2019   EF 50-55%, no LV dilation or WMA. Mod AV sclerosis w/out stenosis, mod-to-sev AI.  Procedure: TRANSESOPHAGEAL ECHOCARDIOGRAM (TEE);  Surgeon: Pricilla Riffle, MD;  Location: [facility] ENDOSCOPY;  Service: Cardiovascular;  Laterality: N/A;   TRANSTHORACIC ECHOCARDIOGRAM  09/2010; 07/2017; 08/2018; 10/2019;05/2020   2012:  EF 40-45%, global hypokinesis, mild AS.  07/2017: no chg except mild/mod AR.  08/2018 EF 20-25%, global LV hypok, DD, biatrial enlargemt, mod AR, aortic root 3.8 cm. 10/2019 EF 50%, mild dec RV fxn, mod-sev AR, aor root dil 42 mm. 05/2020 EF 45-50%,grd I DD, mod AI, aorta 38mm, severe LAE. 10/2020 EF 45-50%, mild LVH, global hypokin, grd I DD, mod AI. Echo 12/2021 EF 45-50%, severe AR     Current Meds  Medication Sig   aspirin EC 81 MG tablet Take 81 mg by mouth daily. Swallow whole.   atorvastatin (LIPITOR) 40 MG tablet TAKE 1 TABLET DAILY   donepezil (ARICEPT) 10 MG tablet Take 1 tablet 10 mg daily   empagliflozin (JARDIANCE) 10 MG TABS tablet Take 1 tablet (10 mg total) by mouth daily before breakfast.   FLUoxetine (PROZAC) 20 MG capsule Take 1 capsule (20 mg total) by mouth daily.   folic acid (FOLVITE) 1 MG tablet TAKE ONE TABLET ONCE DAILY   metoprolol succinate (TOPROL-XL) 100 MG 24 hr tablet TAKE ONE TABLET BY MOUTH DAILY WITH A MEAL   potassium chloride SA (KLOR-CON M) 20 MEQ tablet Take 1 tablet (20 mEq total) by mouth daily.   sacubitril-valsartan (ENTRESTO) 97-103 MG TAKE 1 TABLET 2 TIMES A DAY   spironolactone (ALDACTONE) 25 MG tablet TAKE ONE TABLET BY MOUTH DAILY   thiamine (VITAMIN B-1) 100 MG tablet Take 1 tablet (100 mg total) by mouth daily.     Allergies:   Penicillins   Social History   Tobacco Use   Smoking status: Former   Smokeless tobacco: Current    Types: Chew   Tobacco comments:     chews tobacco  Vaping Use   Vaping status: Never Used  Substance Use Topics   Alcohol use: Yes    Comment: 9/22 - 28 per week; 1/23 - 12 per week, 06/28/22 no drinks during past week  Drug use: No     Family Hx: The patient's family history includes Alzheimer's disease in his mother; COPD in his father; Congenital heart disease in his brother; Heart failure in his father. There is no history of Colon cancer, Esophageal cancer, Stomach cancer, Rectal cancer, or Colon polyps.  ROS:   Please see the history of present illness.     All other systems reviewed and are negative.   Labs/Other Tests and Data Reviewed:    Recent Labs: 04/22/2022: Magnesium 2.1 10/09/2022: ALT 16; BUN 11; Creatinine, Ser 0.92; Hemoglobin 14.9; Platelets 205.0; Potassium 4.1; Sodium 139   Recent Lipid Panel Lab Results  Component Value Date/Time   CHOL 123 09/04/2022 01:28 PM   TRIG 76 09/04/2022 01:28 PM   TRIG 105 05/10/2008 12:00 AM   HDL 47 09/04/2022 01:28 PM   CHOLHDL 2.6 09/04/2022 01:28 PM   CHOLHDL 2 10/04/2021 11:25 AM   LDLCALC 61 09/04/2022 01:28 PM   LDLCALC 101 05/10/2008 12:00 AM    Wt Readings from Last 3 Encounters:  03/06/23 172 lb 3.2 oz (78.1 kg)  12/19/22 174 lb (78.9 kg)  10/10/22 179 lb (81.2 kg)     Objective:    Vital Signs:  BP 98/66 (BP Location: Right Arm)   Pulse 65   Ht 5\' 6"  (1.676 m)   Wt 172 lb 3.2 oz (78.1 kg)   SpO2 96%   BMI 27.79 kg/m   GEN: Well nourished, well developed in no acute distress HEENT: Normal NECK: No JVD; No carotid bruits LYMPHATICS: No lymphadenopathy CARDIAC:RRR, no  rubs, gallops 2/6 SM at RUSB RESPIRATORY:  Clear to auscultation without rales, wheezing or rhonchi  ABDOMEN: Soft, non-tender, non-distended MUSCULOSKELETAL:  No edema; No deformity  SKIN: Warm and dry NEUROLOGIC:  Alert and oriented x 3 PSYCHIATRIC:  Normal affect  ASSESSMENT & PLAN:    1.  Severe mixed DCM -EF on last echo 05/2020 showed improved LVF with EF  45-50% after BiV AICD implant a year ago and EF continues to remain stable at 45 to 50% on echo 06/20/2021 and cardiac MRI 02/2022 -s/p CRT-D in 2020 with improvement in EF>>followed in device clinic -2D echo 6/23 with EF 45-50% -He appears euvolemic today on exam and denies any shortness of breath or edema. -Continue prescription drug management with Jardiance 10 mg daily,  Entresto 97-103 mg twice daily and spironolactone 25 mg daily with as needed refills -decrease Toprol XL to 75mg  daily due to soft BP -I have personally reviewed and interpreted outside labs performed by patient's PCP which showed serum creatinine 0.92 and potassium 4.1 on 10/09/2022 -repeat BMET today  2.  Chronic combined systolic/diastolic CHF -he is NYHA class 2a -Cardiac MRI 02/2022 showed EF 45 to 50%. -2D echo 12/2021 -See above he appears euvolemic on exam today -Continue prescription drug management  Entresto 97-103 mg twice daily, Jardiance 10 mg daily and spironolactone 25 mg daily with as needed refills -decrease Toprol XL to 75mg  daily due to soft BP  3.  Aortic valve disease -2D echo 06-2021 shows stable low normal LV function with EF 45 to 50% with moderate to severe eccentric AI -mild AS with mean AVG 12 mmHg by echo 12/22 -Cardiac MRI/MRA 03/03/2022 showed severe aortic insufficiency with holodiastolic flow reversal also in the descending aorta and AI regurgitant fraction 45%  -He is being followed in structural heart clinic by Dr. Excell Seltzer.  Since his study did not show any LV dilatation and pulse pressure was normal and  he was asymptomatic he was not interested  at that time in pursuing cardiac surgery.  It was felt that because he was having a moderate problem he denies any angina with short-term memory impairment from Alzheimer's dementia it was felt that no further workup should be pursued for aortic valve replacement at this time and conservative measures were recommended. -will repeat 2D echo to make sure  LVF is stable and LV has not enlarged  4.  Mild ascending aortic and aortic root dilatation -Chest CTA measuring aortic root at SOV 45mm at SOV, 36mm in ascending aorta and abdominal CTA with aortic atherosclerosis but no AAA -Cardiac MRI 03/13/2022 showed severe aortic insufficiency with regurgitant fraction 45% and holodiastolic flow reversal in the descending aorta and no thoracic aortic dilatation -continue statin and BP control  5.  ASCAD - cath 12/2018 showing 95% pRCA, 75% OM1 and 50% prox to mid LAD.  He underwent attempted PCI of the RCA but unable to cross the lesion and medical therapy was recommended. -He denies any anginal symptoms -Continue prescription drug management with aspirin 81 mg daily, atorvastatin 40 mg daily with as needed refills -decrease Toprol to 75mg  daily as above  6.  HLD -LDL goal < 70 -Continue prescription drug management with atorvastatin 40 mg daily with as needed refills -I have personally reviewed and interpreted outside labs performed by patient's PCP which showed LDL 61 and HDL 47 on 09/04/2022  7.  HTN -BP is well-controlled on exam today -Continue drug management with Aldactone 25 mg daily, Entresto 97-23 mg twice daily with as needed refills -decrease Toprol XL to 75mg  daily due to soft BP -I have asked his wife to check his BP daily for a week and call with results  Medication Adjustments/Labs and Tests Ordered: Current medicines are reviewed at length with the patient today.  Concerns regarding medicines are outlined above.  Tests Ordered: Orders Placed This Encounter  Procedures   Basic Metabolic Panel (BMET)   Medication Changes: No orders of the defined types were placed in this encounter.   Disposition:  Follow up in 6 months  Signed, Armanda Magic, MD  03/06/2023 1:38 PM    [facility]

## 2023-03-06 NOTE — Addendum Note (Signed)
Addended by: Luellen Pucker on: 03/06/2023 03:09 PM   Modules accepted: Orders

## 2023-03-06 NOTE — Patient Instructions (Addendum)
Medication Instructions:  Please decrease your dose of Toprol from 100 mg daily to 75 mg daily.  *If you need a refill on your cardiac medications before your next appointment, please call your pharmacy*   Lab Work: Please complete a BMET in our lab before you leave today.   If you have labs (blood work) drawn today and your tests are completely normal, you will receive your results only by: MyChart Message (if you have MyChart) OR A paper copy in the mail If you have any lab test that is abnormal or we need to change your treatment, we will call you to review the results.   Testing/Procedures: Your physician has requested that you have an echocardiogram. Echocardiography is a painless test that uses sound waves to create images of your heart. It provides your doctor with information about the size and shape of your heart and how well your heart's chambers and valves are working. This procedure takes approximately one hour. There are no restrictions for this procedure. Please do NOT wear cologne, perfume, aftershave, or lotions (deodorant is allowed). Please arrive 15 minutes prior to your appointment time.    Follow-Up: At [facility], you and your health needs are our priority.  As part of our continuing mission to provide you with exceptional heart care, we have created designated Provider Care Teams.  These Care Teams include your primary Cardiologist (physician) and Advanced Practice Providers (APPs -  Physician Assistants and Nurse Practitioners) who all work together to provide you with the care you need, when you need it.  We recommend signing up for the patient portal called "MyChart".  Sign up information is provided on this After Visit Summary.  MyChart is used to connect with patients for Virtual Visits (Telemedicine).  Patients are able to view lab/test results, encounter notes, upcoming appointments, etc.  Non-urgent messages can be sent to your provider as well.    To learn more about what you can do with MyChart, go to [email or web address].    Your next appointment:   6 month(s)  Provider:   Armanda Magic, MD     Other Instructions: Please check your blood pressure daily (2-3 hours after you have taken your morning blood pressure medications) for a week. Write down your readings and then call our office. All of our operators can enter your readings in your chart for Dr. Mayford Knife to review.

## 2023-03-06 NOTE — Addendum Note (Signed)
Addended by: Luellen Pucker on: 03/06/2023 01:46 PM   Modules accepted: Orders

## 2023-03-07 ENCOUNTER — Other Ambulatory Visit: Payer: Self-pay | Admitting: Cardiology

## 2023-03-07 LAB — BASIC METABOLIC PANEL
BUN/Creatinine Ratio: 12 (ref 10–24)
BUN: 12 mg/dL (ref 8–27)
CO2: 23 mmol/L (ref 20–29)
Calcium: 9.4 mg/dL (ref 8.6–10.2)
Chloride: 108 mmol/L — ABNORMAL HIGH (ref 96–106)
Creatinine, Ser: 0.97 mg/dL (ref 0.76–1.27)
Glucose: 88 mg/dL (ref 70–99)
Potassium: 4.1 mmol/L (ref 3.5–5.2)
Sodium: 143 mmol/L (ref 134–144)
eGFR: 81 mL/min/{1.73_m2} (ref >59)

## 2023-03-18 ENCOUNTER — Encounter: Payer: Self-pay | Admitting: Cardiology

## 2023-03-18 DIAGNOSIS — M47816 Spondylosis without myelopathy or radiculopathy, lumbar region: Secondary | ICD-10-CM | POA: Diagnosis not present

## 2023-03-18 DIAGNOSIS — M85852 Other specified disorders of bone density and structure, left thigh: Secondary | ICD-10-CM | POA: Diagnosis not present

## 2023-03-18 DIAGNOSIS — Z13820 Encounter for screening for osteoporosis: Secondary | ICD-10-CM | POA: Diagnosis not present

## 2023-03-19 MED ORDER — METOPROLOL SUCCINATE ER 25 MG PO TB24: 25.0000 mg | ORAL_TABLET | Freq: Every day | ORAL | 3 refills | Status: DC

## 2023-03-21 DIAGNOSIS — E559 Vitamin D deficiency, unspecified: Secondary | ICD-10-CM | POA: Diagnosis not present

## 2023-03-21 DIAGNOSIS — Z9189 Other specified personal risk factors, not elsewhere classified: Secondary | ICD-10-CM | POA: Diagnosis not present

## 2023-04-01 ENCOUNTER — Ambulatory Visit (HOSPITAL_COMMUNITY): Payer: Medicare PPO | Attending: Cardiology

## 2023-04-01 ENCOUNTER — Encounter: Payer: Self-pay | Admitting: Cardiology

## 2023-04-01 DIAGNOSIS — I359 Nonrheumatic aortic valve disorder, unspecified: Secondary | ICD-10-CM

## 2023-04-01 LAB — ECHOCARDIOGRAM COMPLETE
AR max vel: 1.07 cm2
AV Area VTI: 1.16 cm2
AV Area mean vel: 1.02 cm2
AV Mean grad: 13 mmHg
AV Peak grad: 22.8 mmHg
Ao pk vel: 2.39 m/s
Area-P 1/2: 4.44 cm2
P 1/2 time: 479 ms
S' Lateral: 3.4 cm

## 2023-04-07 ENCOUNTER — Encounter: Payer: Medicare PPO | Admitting: Psychology

## 2023-04-07 ENCOUNTER — Ambulatory Visit (INDEPENDENT_AMBULATORY_CARE_PROVIDER_SITE_OTHER): Payer: Medicare PPO

## 2023-04-07 ENCOUNTER — Encounter: Payer: Self-pay | Admitting: Family Medicine

## 2023-04-07 DIAGNOSIS — I255 Ischemic cardiomyopathy: Secondary | ICD-10-CM

## 2023-04-08 ENCOUNTER — Ambulatory Visit: Payer: Medicare PPO

## 2023-04-09 LAB — CUP PACEART REMOTE DEVICE CHECK
Battery Remaining Longevity: 38 mo
Battery Remaining Percentage: 53 %
Battery Voltage: 2.93 V
Brady Statistic AP VP Percent: 92 %
Brady Statistic AP VS Percent: 1 %
Brady Statistic AS VP Percent: 7.3 %
Brady Statistic AS VS Percent: 1 %
Brady Statistic RA Percent Paced: 92 %
Date Time Interrogation Session: 20240925170940
HighPow Impedance: 60 Ohm
Implantable Lead Connection Status: 753985
Implantable Lead Connection Status: 753985
Implantable Lead Connection Status: 753985
Implantable Lead Implant Date: 20201117
Implantable Lead Implant Date: 20201117
Implantable Lead Implant Date: 20201117
Implantable Lead Location: 753858
Implantable Lead Location: 753859
Implantable Lead Location: 753860
Implantable Pulse Generator Implant Date: 20201117
Lead Channel Impedance Value: 400 Ohm
Lead Channel Impedance Value: 440 Ohm
Lead Channel Impedance Value: 710 Ohm
Lead Channel Pacing Threshold Amplitude: 0.625 V
Lead Channel Pacing Threshold Amplitude: 1 V
Lead Channel Pacing Threshold Amplitude: 2.25 V
Lead Channel Pacing Threshold Pulse Width: 0.5 ms
Lead Channel Pacing Threshold Pulse Width: 0.5 ms
Lead Channel Pacing Threshold Pulse Width: 0.5 ms
Lead Channel Sensing Intrinsic Amplitude: 11.8 mV
Lead Channel Sensing Intrinsic Amplitude: 2.1 mV
Lead Channel Setting Pacing Amplitude: 1.5 V
Lead Channel Setting Pacing Amplitude: 1.625
Lead Channel Setting Pacing Amplitude: 3.75 V
Lead Channel Setting Pacing Pulse Width: 0.5 ms
Lead Channel Setting Pacing Pulse Width: 0.5 ms
Lead Channel Setting Sensing Sensitivity: 0.5 mV
Pulse Gen Serial Number: 111012743
Zone Setting Status: 755011

## 2023-04-15 ENCOUNTER — Ambulatory Visit: Payer: Medicare PPO | Attending: Cardiology

## 2023-04-15 DIAGNOSIS — I5042 Chronic combined systolic (congestive) and diastolic (congestive) heart failure: Secondary | ICD-10-CM | POA: Diagnosis not present

## 2023-04-15 DIAGNOSIS — Z95810 Presence of automatic (implantable) cardiac defibrillator: Secondary | ICD-10-CM | POA: Diagnosis not present

## 2023-04-15 NOTE — Progress Notes (Signed)
EPIC Encounter for ICM Monitoring  Patient Name: Anthony Daugherty is a 77 y.o. male Date: 04/15/2023 Primary Care Physican: Jeoffrey Massed, MD Primary Cardiologist: Mayford Knife Electrophysiologist: Kathreen Cornfield Pacing: >99%         11/14/2021 Office Weight: 160 lbs   AT/AF Burden: 0%                                                            Transmission reviewed.    CorVue thoracic impedance suggesting normal fluid levels within the last month with the exception of possible fluid accumulation from 9/20-9/24.   Prescribed: Potassium 20 mEq take 1 tablet by mouth daily Spironolactone 25 mg take 1 tablet by mouth daily    Labs: 10/09/2022 Creatinine 0.92, BUN 11, Potassium 4.1, Sodium 139, GFR 81.03 A complete set of results can be found in Results Review.   Recommendations:  No changes.   Follow-up plan: ICM clinic phone appointment on 11//10/2022.   91 day device clinic remote transmission 07/07/2023.   EP/Cardiology Office Visits:  Recall 10/05/2023 with Dr Nelly Laurence.     Copy of ICM check sent to Dr. Elberta Fortis.    3 month ICM trend: 04/09/2023.    12-14 Month ICM trend:     Karie Soda, RN 04/15/2023 10:05 AM

## 2023-04-17 ENCOUNTER — Encounter: Payer: Self-pay | Admitting: Family Medicine

## 2023-04-17 ENCOUNTER — Ambulatory Visit: Payer: Medicare PPO | Admitting: Family Medicine

## 2023-04-17 VITALS — BP 104/65 | HR 65 | Wt 171.0 lb

## 2023-04-17 DIAGNOSIS — R454 Irritability and anger: Secondary | ICD-10-CM | POA: Diagnosis not present

## 2023-04-17 MED ORDER — POTASSIUM CHLORIDE CRYS ER 20 MEQ PO TBCR: 20.0000 meq | EXTENDED_RELEASE_TABLET | Freq: Every day | ORAL | 1 refills | Status: DC

## 2023-04-17 MED ORDER — FLUOXETINE HCL 40 MG PO CAPS: 40.0000 mg | ORAL_CAPSULE | Freq: Every day | ORAL | 1 refills | Status: DC

## 2023-04-17 NOTE — Progress Notes (Signed)
OFFICE VISIT  04/17/2023  CC:  Chief Complaint  Patient presents with   Medical Management of Chronic Issues    Patient is a 77 y.o. male who presents accompanied by his wife Liborio Nixon for 67-month follow-up anger control problems in the setting of memory/cognitive impairment and habitual alcohol use. At his last visit he was improved on Prozac 20 mg a day.  INTERIM HX: Anger control little bit more of an issue over the last several months.  Easily irritated by questions.  When asked to do anything he expresses anger. He is not violent in any way. Memory loss seems to be stable lately.  He has no depressed mood, no anxiety problems. He has no confusion, no hallucinations or delusions. He does not drink alcohol. He spends some time in his shed "piddling".  He apparently used to make duck decoys/woodworking but his wife says he did not do this anymore.    Past Medical History:  Diagnosis Date   Amnestic MCI (mild cognitive impairment with memory loss) 08/09/2021   Aortic insufficiency 10/21/2018   Mild-mod on echo 07/2017.  Moderate 08/2018.  Moderate-severe on echo 04/2019--> TEE 11/15/19 EF 50-55%, no LV dilation or WMA. Mod AV sclerosis w/out stenosis, mod-to-sev AI. 11/2020 moderate AI. severe eccentric by echo 12/2021.  Severe on echo 12/2021->cardiac MRI confirmed.  Moderate on echo 03/2023   Aortic root dilatation 10/2018   11/17/19 45 mm aortic root at the sinuses of Valsalva-> 11/2020 echo->40 mm, unchanged on echo 03/2023   Aortic stenosis, mild    mild to moderate by echo 03/2023 with mean AVG and DVI   Arthritis    L-Spine   Cardiac murmur    Systolic and diastolic as of 07/2017.   Cardiomyopathy 10/12/2010   Global hypokinesis, EF 40-45% by cath and echo (2012 echo and 2019 echo).  Feb 2020 EF 20-25%. - mixed ICM/NICM (alcohol and CAD)  October 2020 EF 25-30%. 11/2020 EF 40-45%. 06/2021 EF 45-50%,dil LV+hypokinesis,mod AI,grd I DD.   Chronic systolic (congestive) heart failure  06/01/2019   max med mgmt, plus CRT-D placed 05/2019, EF improved to 50% after. Cardiac MRI 03/2022 EF 40%   Coronary artery disease 11/16/2010   a. 2012 - 80% OM lesion. b. Cath 12/2018 - showing 95% prox RCA, 75% OM1, 50% prox-mid LAD. Unsuccessful PCI to 100% chronically occluded RCA summer 2020. CT angio chest w/aorta 08/2021->severe coronary calc's, no aneurism. Mild subclav stenosis d/t athero   Diverticulosis    Erectile dysfunction 08/16/2011   Essential hypertension 07/19/2010   GAD (generalized anxiety disorder)    managed by Dr. Evelene Croon with prozac 20mg  qd   Gout    Usually Right great toe; colchicine helps well   Gouty arthritis of toe of left foot 05/27/2013   Habitual alcohol use    History of colonoscopy    Initial was normal; 2014-polypectomy and diverticulosis.  Recall 2019.   Hyperlipidemia 10/23/2012   LBBB (left bundle branch block)    CRT-D placed 05/2019   Low back strain 10/24/2013   Multiple lacunar infarcts 11/15/2021   Right caudate nucleus (acute/subacute 11/2021); remote subcentimeter infarct within the inferior right cerebellar hemisphere   Obesity, Class I, BMI 30-34.9    Pulmonary nodule    0.5 cm RLL subpleural nodule.  Pt NEVER SMOKER.  Low risk->no f/u imaging is needed.   Subclavian artery stenosis, left    mild, detected on CT angio chest w/aorta 08/2021   Thyroid nodule 04/23/2013    Past Surgical  History:  Procedure Laterality Date   BIV ICD INSERTION CRT-D N/A 06/01/2019   Procedure: BIV ICD INSERTION CRT-D;  Surgeon: Hillis Range, MD;  Location: [facility] INVASIVE CV LAB;  Service: Cardiovascular;  Laterality: N/A;   CARDIAC CATHETERIZATION  10/12/2010   diagnostic only: 1 vessel CAD with TIMI 3 flow, EF 40-45%.  Mild AS.  Medical mgmt of CAD.   COLONOSCOPY     COLONOSCOPY W/ POLYPECTOMY  10/06/2012   Diverticulosis and tubular adenoma w/out high grade dysplasia.  Repeat 5 yrs (Dr. Arlyce Dice).   CORONARY ATHERECTOMY N/A 01/08/2019   100% chronic RCA  occlusion impossible to clear.  Med mgmt. Procedure: CORONARY ATHERECTOMY;  Surgeon: Tonny Bollman, MD;  Location: [facility] INVASIVE CV LAB;  Service: Cardiovascular;  Laterality: N/A;   POLYPECTOMY     RIGHT HEART CATH AND CORONARY ANGIOGRAPHY N/A 12/24/2018   95% RCA occlusion, 75% obtuse marginal occlusion (3V dz). Procedure: RIGHT HEART CATH AND CORONARY ANGIOGRAPHY;  Surgeon: Runell Gess, MD;  Location: [facility] INVASIVE CV LAB;  Service: Cardiovascular;  Laterality: N/A;   TEE WITHOUT CARDIOVERSION N/A 11/15/2019   EF 50-55%, no LV dilation or WMA. Mod AV sclerosis w/out stenosis, mod-to-sev AI.  Procedure: TRANSESOPHAGEAL ECHOCARDIOGRAM (TEE);  Surgeon: Pricilla Riffle, MD;  Location: [facility] ENDOSCOPY;  Service: Cardiovascular;  Laterality: N/A;   TRANSTHORACIC ECHOCARDIOGRAM  09/2010; 07/2017; 08/2018; 10/2019;05/2020   2012:  EF 40-45%, global hypokinesis, mild AS.  07/2017: no chg except mild/mod AR.  08/2018 EF 20-25%, global LV hypok, DD, biatrial enlargemt, mod AR, aortic root 3.8 cm. 10/2019 EF 50%, mild dec RV fxn, mod-sev AR, aor root dil 42 mm. 05/2020 EF 45-50%,grd I DD, mod AI, aorta 38mm, severe LAE. 10/2020 EF 45-50%, mild LVH, global hypokin, grd I DD, mod AI. Echo 12/2021 EF 45-50%, severe AR   TRANSTHORACIC ECHOCARDIOGRAM     03/2023 EF 50-55%, mild MR, mod TR, mod AS and AR    Outpatient Medications Prior to Visit  Medication Sig Dispense Refill   aspirin EC 81 MG tablet Take 81 mg by mouth daily. Swallow whole.     atorvastatin (LIPITOR) 40 MG tablet TAKE 1 TABLET DAILY 90 tablet 3   donepezil (ARICEPT) 10 MG tablet Take 1 tablet 10 mg daily 90 tablet 3   empagliflozin (JARDIANCE) 10 MG TABS tablet Take 1 tablet (10 mg total) by mouth daily before breakfast. 90 tablet 3   folic acid (FOLVITE) 1 MG tablet TAKE ONE TABLET ONCE DAILY 90 tablet 3   metoprolol succinate (TOPROL XL) 25 MG 24 hr tablet Take 1 tablet (25 mg total) by mouth daily. 90 tablet 3   sacubitril-valsartan (ENTRESTO)  97-103 MG TAKE 1 TABLET 2 TIMES A DAY 180 tablet 3   spironolactone (ALDACTONE) 25 MG tablet TAKE ONE TABLET BY MOUTH DAILY 90 tablet 3   thiamine (VITAMIN B-1) 100 MG tablet Take 1 tablet (100 mg total) by mouth daily. 90 tablet 3   Vitamin D, Ergocalciferol, (DRISDOL) 1.25 MG (50000 UNIT) CAPS capsule Take 50,000 Units by mouth once a week.     FLUoxetine (PROZAC) 20 MG capsule Take 1 capsule (20 mg total) by mouth daily. 90 capsule 3   potassium chloride SA (KLOR-CON M) 20 MEQ tablet Take 1 tablet (20 mEq total) by mouth daily. 90 tablet 1   No facility-administered medications prior to visit.    Allergies  Allergen Reactions   Penicillins Swelling    Did it involve swelling of the face/tongue/throat, SOB, or low BP?  No Did it involve sudden or severe rash/hives, skin peeling, or any reaction on the inside of your mouth or nose? No Did you need to seek medical attention at a hospital or doctor's office? No When did it last happen? ~10 years ago If all above answers are "NO", may proceed with cephalosporin use.     Review of Systems As per HPI  PE:    04/17/2023    1:26 PM 03/06/2023    1:33 PM 03/06/2023    1:04 PM  Vitals with BMI  Height   5\' 6"   Weight 171 lbs  172 lbs 3 oz  BMI   27.81  Systolic 104 98 82  Diastolic 65 66 60  Pulse 65  65     Physical Exam  Gen: Alert, well appearing.  He attends appropriately.  Patient is oriented to person, place, time, and situation. AFFECT: pleasant, lucid thought and speech. He repeats questions some during our conversation today. No edema. No further exam today.  LABS:  Last CBC Lab Results  Component Value Date   WBC 5.0 10/09/2022   HGB 14.9 10/09/2022   HCT 43.5 10/09/2022   MCV 92.1 10/09/2022   MCH WILL FOLLOW 11/12/2019   MCH 32.5 11/12/2019   RDW 14.0 10/09/2022   PLT 205.0 10/09/2022   Last metabolic panel Lab Results  Component Value Date   GLUCOSE 88 03/06/2023   NA 143 03/06/2023   K 4.1  03/06/2023   CL 108 (H) 03/06/2023   CO2 23 03/06/2023   BUN 12 03/06/2023   CREATININE 0.97 03/06/2023   EGFR 81 03/06/2023   CALCIUM 9.4 03/06/2023   PROT 6.4 10/09/2022   ALBUMIN 4.3 10/09/2022   LABGLOB 1.9 04/26/2019   AGRATIO 2.2 04/26/2019   BILITOT 1.1 10/09/2022   ALKPHOS 77 10/09/2022   AST 17 10/09/2022   ALT 16 10/09/2022   ANIONGAP 9 06/02/2019   Last lipids Lab Results  Component Value Date   CHOL 123 09/04/2022   HDL 47 09/04/2022   LDLCALC 61 09/04/2022   TRIG 76 09/04/2022   CHOLHDL 2.6 09/04/2022   Last thyroid functions Lab Results  Component Value Date   TSH 3.45 04/12/2021   Last vitamin B12 and Folate Lab Results  Component Value Date   VITAMINB12 196 (L) 10/09/2022   FOLATE >23.8 10/09/2022   IMPRESSION AND PLAN:  Anger control problems in the setting of memory/cognitive impairment and history of habitual alcohol use. Increase Prozac to 40 mg/day.  An After Visit Summary was printed and given to the patient.  FOLLOW UP: Return in about 6 months (around 10/16/2023) for annual CPE (fasting).  Signed:  Santiago Bumpers, MD           04/17/2023

## 2023-04-22 NOTE — Progress Notes (Signed)
Remote ICD transmission.   

## 2023-04-23 ENCOUNTER — Encounter: Payer: Medicare PPO | Admitting: Psychology

## 2023-05-05 ENCOUNTER — Telehealth: Payer: Self-pay

## 2023-05-05 NOTE — Telephone Encounter (Signed)
-----   Message from Nurse Corky Crafts sent at 04/04/2023 12:35 PM EDT -----  ----- Message ----- From: Quintella Reichert, MD Sent: 04/01/2023   3:43 PM EDT To: Jeoffrey Massed, MD; Cv Div [facility] St Triage  Echo showed low normal heart function with mildly thickened heart muscle likely related to HTN.  The RV and the right atrium are mildly enlarged.  There is mild leakiness of the mitral valve, mild to moderate leakiness of the TV, moderate leakiness of the AV with thickened and calcified AV with mild to moderate decreased movement of the AV called aortic stenosis.  Repeat echo in 1 year

## 2023-05-05 NOTE — Telephone Encounter (Signed)
Call, no answer. Left detailed message per DPR explaining that echo showed low normal heart function with some structural changes such as leaking from valves. Asked patient to give our office a call for full report.

## 2023-05-06 DIAGNOSIS — H04123 Dry eye syndrome of bilateral lacrimal glands: Secondary | ICD-10-CM | POA: Diagnosis not present

## 2023-05-07 ENCOUNTER — Encounter: Payer: Self-pay | Admitting: Cardiology

## 2023-05-08 ENCOUNTER — Telehealth: Payer: Self-pay

## 2023-05-08 NOTE — Telephone Encounter (Signed)
Call to review echo results, no answer, left detailed message per DPR asking patient to call our office to discuss echo results. Letter sent.

## 2023-05-08 NOTE — Telephone Encounter (Signed)
-----   Message from Nurse Corky Crafts sent at 04/04/2023 12:35 PM EDT -----  ----- Message ----- From: Quintella Reichert, MD Sent: 04/01/2023   3:43 PM EDT To: Jeoffrey Massed, MD; Cv Div [facility] St Triage  Echo showed low normal heart function with mildly thickened heart muscle likely related to HTN.  The RV and the right atrium are mildly enlarged.  There is mild leakiness of the mitral valve, mild to moderate leakiness of the TV, moderate leakiness of the AV with thickened and calcified AV with mild to moderate decreased movement of the AV called aortic stenosis.  Repeat echo in 1 year

## 2023-05-19 ENCOUNTER — Ambulatory Visit: Payer: Medicare PPO | Attending: Cardiology

## 2023-05-19 DIAGNOSIS — Z95810 Presence of automatic (implantable) cardiac defibrillator: Secondary | ICD-10-CM

## 2023-05-19 DIAGNOSIS — I5042 Chronic combined systolic (congestive) and diastolic (congestive) heart failure: Secondary | ICD-10-CM

## 2023-05-20 NOTE — Progress Notes (Signed)
EPIC Encounter for ICM Monitoring  Patient Name: Anthony Daugherty is a 77 y.o. male Date: 05/20/2023 Primary Care Physican: Jeoffrey Massed, MD Primary Cardiologist: Mayford Knife Electrophysiologist: Kathreen Cornfield Pacing: >99%         11/14/2021 Office Weight: 160 lbs 04/17/2023 Weight: 171 lbs   AT/AF Burden: <1%                                                            Spoke with wife and heart failure questions reviewed.  Transmission results reviewed.  Pt asymptomatic for fluid accumulation.  Reports feeling well at this time and voices no complaints.   Pt did foods this past weekend that may have been high in salt.    CorVue thoracic impedance suggesting possible fluid accumulation starting 11/3 and 10/8-10/20.   Prescribed: Potassium 20 mEq take 1 tablet by mouth daily Spironolactone 25 mg take 1 tablet by mouth daily    Labs: 10/09/2022 Creatinine 0.92, BUN 11, Potassium 4.1, Sodium 139, GFR 81.03 A complete set of results can be found in Results Review.   Recommendations:  Advised to limit salt and fluid intake and call if he experiences any fluid symptoms.   Follow-up plan: ICM clinic phone appointment on 05/26/2023 to recheck fluid levels.   91 day device clinic remote transmission 07/07/2023.   EP/Cardiology Office Visits:  Recall 10/05/2023 with Dr Nelly Laurence.     Copy of ICM check sent to Dr. Elberta Fortis.  Sent to Dr Mayford Knife for review and recommendations if needed.    3 month ICM trend: 05/19/2023.    12-14 Month ICM trend:     Karie Soda, RN 05/20/2023 9:23 AM

## 2023-05-22 ENCOUNTER — Telehealth: Payer: Self-pay

## 2023-05-22 NOTE — Telephone Encounter (Signed)
Call to patient to discuss tracking his weight, no answer, left message per DPR asking patient to call our office.

## 2023-05-22 NOTE — Telephone Encounter (Signed)
-----   Message from Armanda Magic sent at 05/20/2023  7:59 PM EST ----- Alcario Drought please find out what his baseline weight is and then have him weight himself daily going forward and call if weight increases more than 3 lbs from baseline weight in 1 day or 5lbs in 1 week ----- Message ----- From: Karie Soda, RN Sent: 05/20/2023  10:33 AM EST To: Quintella Reichert, MD; Will Jorja Loa, MD  Dr Mayford Knife, sent for review and recommendations if needed.  Thank you.

## 2023-05-26 ENCOUNTER — Ambulatory Visit: Payer: Medicare PPO | Attending: Cardiovascular Disease

## 2023-05-26 DIAGNOSIS — I5042 Chronic combined systolic (congestive) and diastolic (congestive) heart failure: Secondary | ICD-10-CM

## 2023-05-26 DIAGNOSIS — Z95810 Presence of automatic (implantable) cardiac defibrillator: Secondary | ICD-10-CM

## 2023-05-28 NOTE — Progress Notes (Signed)
EPIC Encounter for ICM Monitoring  Patient Name: Anthony Daugherty is a 77 y.o. male Date: 05/28/2023 Primary Care Physican: Jeoffrey Massed, MD Primary Cardiologist: Mayford Knife Electrophysiologist: Kathreen Cornfield Pacing: >99%         11/14/2021 Office Weight: 160 lbs 04/17/2023 Weight: 171 lbs   AT/AF Burden: <1%                                                            Spoke with wife and heart failure questions reviewed.  Transmission results reviewed.  Pt asymptomatic for fluid accumulation.      CorVue thoracic impedance suggesting fluid levels returned to normal.   Prescribed: Potassium 20 mEq take 1 tablet by mouth daily Spironolactone 25 mg take 1 tablet by mouth daily    Labs: 10/09/2022 Creatinine 0.92, BUN 11, Potassium 4.1, Sodium 139, GFR 81.03 A complete set of results can be found in Results Review.   Recommendations:  No changes and encouraged to call if experiencing any fluid symptoms.   Follow-up plan: ICM clinic phone appointment on 06/23/2023.   91 day device clinic remote transmission 07/07/2023.   EP/Cardiology Office Visits:  Recall 10/05/2023 with Dr Nelly Laurence.     Copy of ICM check sent to Dr. Elberta Fortis.   3 month ICM trend: 05/26/2023.    12-14 Month ICM trend:     Karie Soda, RN 05/28/2023 2:40 PM

## 2023-06-04 ENCOUNTER — Telehealth: Payer: Self-pay

## 2023-06-04 NOTE — Telephone Encounter (Signed)
Call to patient, spoke to spouse Anthony Daugherty [facility]) to discuss patient's baseline weight. Anthony Daugherty states his baseline weight is 171 lbs and if anything patient has lost weight. Reviewed Dr. Norris Cross recommendation that he weight himself daily going forward and call if weight increases more than 3 lbs from baseline weight in 1 day or 5lbs in 1 week. Spouse verbalizes understanding.

## 2023-06-04 NOTE — Telephone Encounter (Signed)
-----   Message from Nurse Alcario Drought E sent at 05/22/2023  8:25 AM EST -----  ----- Message ----- From: Quintella Reichert, MD Sent: 05/20/2023   8:00 PM EST To: Regan Lemming, MD; Karie Soda, RN; #  Alcario Drought please find out what his baseline weight is and then have him weight himself daily going forward and call if weight increases more than 3 lbs from baseline weight in 1 day or 5lbs in 1 week ----- Message ----- From: Karie Soda, RN Sent: 05/20/2023  10:33 AM EST To: Quintella Reichert, MD; Will Jorja Loa, MD  Dr Mayford Knife, sent for review and recommendations if needed.  Thank you.

## 2023-06-23 ENCOUNTER — Ambulatory Visit: Payer: Medicare PPO | Admitting: Physician Assistant

## 2023-06-23 ENCOUNTER — Encounter: Payer: Self-pay | Admitting: Physician Assistant

## 2023-06-23 VITALS — BP 115/81 | HR 80 | Ht 66.0 in

## 2023-06-23 DIAGNOSIS — G3184 Mild cognitive impairment, so stated: Secondary | ICD-10-CM

## 2023-06-23 MED ORDER — MEMANTINE HCL 5 MG PO TABS: ORAL_TABLET | ORAL | 11 refills | Status: DC

## 2023-06-23 MED ORDER — DONEPEZIL HCL 10 MG PO TABS: ORAL_TABLET | ORAL | 3 refills | Status: DC

## 2023-06-23 NOTE — Patient Instructions (Addendum)
It was a pleasure to see you today at our office.   Recommendations:  Follow up in 6  months Continue taking donepezil 10 mg daily  Start Memantine 5mg  tablets.  Take 1 tablet at bedtime for 2 weeks, then 1 tablet twice daily. Repeat neuropsych evaluation  Continue taking ASA baby and statin  Continue control of mood with primary doctor   Whom to call:  Memory  decline, memory medications: Call our office [phone]   For psychiatric meds, mood meds: Please have your primary care physician manage these medications.   Counseling regarding caregiver distress, including caregiver depression, anxiety and issues regarding community resources, adult day care programs, adult living facilities, or memory care questions:   Feel free to contact Misty Lisabeth Register, Social Worker at [phone]   For assessment of decision of mental capacity and competency:  Call Dr. Erick Blinks, geriatric psychiatrist at [phone]  For guidance in geriatric dementia issues please call [facility] [phone]  For guidance regarding [facility] and if placement were needed at the facility, contact Sidney Ace, Social Worker tel: [phone]  If you have any severe symptoms of a stroke, or other severe issues such as confusion,severe chills or fever, etc call 911 or go to the ER as you may need to be evaluated further   Feel free to visit Facebook page " [employer]" for tips of how to care for people with memory problems.         RECOMMENDATIONS FOR ALL PATIENTS WITH MEMORY PROBLEMS: 1. Continue to exercise (Recommend 30 minutes of walking everyday, or 3 hours every week) 2. Increase social interactions - continue going to [place] and enjoy social gatherings with friends and family 3. Eat healthy, avoid fried foods and eat more fruits and vegetables 4. Maintain adequate blood pressure, blood sugar, and blood cholesterol level. Reducing the risk of stroke and  cardiovascular disease also helps promoting better memory. 5. Avoid stressful situations. Live a simple life and avoid aggravations. Organize your time and prepare for the next day in anticipation. 6. Sleep well, avoid any interruptions of sleep and avoid any distractions in the bedroom that may interfere with adequate sleep quality 7. Avoid sugar, avoid sweets as there is a strong link between excessive sugar intake, diabetes, and cognitive impairment We discussed the Mediterranean diet, which has been shown to help patients reduce the risk of progressive memory disorders and reduces cardiovascular risk. This includes eating fish, eat fruits and green leafy vegetables, nuts like almonds and hazelnuts, walnuts, and also use olive oil. Avoid fast foods and fried foods as much as possible. Avoid sweets and sugar as sugar use has been linked to worsening of memory function.  There is always a concern of gradual progression of memory problems. If this is the case, then we may need to adjust level of care according to patient needs. Support, both to the patient and caregiver, should then be put into place.    FALL PRECAUTIONS: Be cautious when walking. Scan the area for obstacles that may increase the risk of trips and falls. When getting up in the mornings, sit up at the edge of the bed for a few minutes before getting out of bed. Consider elevating the bed at the head end to avoid drop of blood pressure when getting up. Walk always in a well-lit room (use night lights in the walls). Avoid area rugs or power cords from appliances in the middle of the walkways. Use a walker or  a cane if necessary and consider physical therapy for balance exercise. Get your eyesight checked regularly.  FINANCIAL OVERSIGHT: Supervision, especially oversight when making financial decisions or transactions is also recommended.  HOME SAFETY: Consider the safety of the kitchen when operating appliances like stoves, microwave oven,  and blender. Consider having supervision and share cooking responsibilities until no longer able to participate in those. Accidents with firearms and other hazards in the house should be identified and addressed as well.   ABILITY TO BE LEFT ALONE: If patient is unable to contact 911 operator, consider using LifeLine, or when the need is there, arrange for someone to stay with patients. Smoking is a fire hazard, consider supervision or cessation. Risk of wandering should be assessed by caregiver and if detected at any point, supervision and safe proof recommendations should be instituted.  MEDICATION SUPERVISION: Inability to self-administer medication needs to be constantly addressed. Implement a mechanism to ensure safe administration of the medications.   DRIVING: Regarding driving, in patients with progressive memory problems, driving will be impaired. We advise to have someone else do the driving if trouble finding directions or if minor accidents are reported. Independent driving assessment is available to determine safety of driving.   If you are interested in the driving assessment, you can contact the following:  [employer] in [place] [phone]  [facility] [phone]  [facility] [phone]  [facility] [phone] or [phone]
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
## 2023-06-24 NOTE — Progress Notes (Signed)
No ICM remote transmission received for 06/23/2023 and next ICM transmission scheduled for 07/21/2023.

## 2023-06-25 ENCOUNTER — Ambulatory Visit: Payer: Medicare PPO | Attending: Cardiology

## 2023-06-25 ENCOUNTER — Telehealth: Payer: Self-pay

## 2023-06-25 DIAGNOSIS — Z95810 Presence of automatic (implantable) cardiac defibrillator: Secondary | ICD-10-CM

## 2023-06-25 DIAGNOSIS — I5042 Chronic combined systolic (congestive) and diastolic (congestive) heart failure: Secondary | ICD-10-CM | POA: Diagnosis not present

## 2023-06-25 NOTE — Telephone Encounter (Signed)
Remote ICM transmission received.  Attempted call to patient/wife regarding ICM remote transmission and no answer.

## 2023-06-25 NOTE — Progress Notes (Signed)
EPIC Encounter for ICM Monitoring  Patient Name: Anthony Daugherty is a 77 y.o. male Date: 06/25/2023 Primary Care Physican: Jeoffrey Massed, MD Primary Cardiologist: Mayford Knife Electrophysiologist: Kathreen Cornfield Pacing: >99%         11/14/2021 Office Weight: 160 lbs 04/17/2023 Weight: 171 lbs   AT/AF Burden: <1%                                                            Attempted call to patient and unable to reach.   Transmission results reviewed.       CorVue thoracic impedance suggesting normal fluid levels with the exception of possible fluid accumulation from 11/26-12/6.   Prescribed: Potassium 20 mEq take 1 tablet by mouth daily Spironolactone 25 mg take 1 tablet by mouth daily    Labs: 10/09/2022 Creatinine 0.92, BUN 11, Potassium 4.1, Sodium 139, GFR 81.03 A complete set of results can be found in Results Review.   Recommendations:  Unable to reach.     Follow-up plan: ICM clinic phone appointment on 07/28/2023.   91 day device clinic remote transmission 07/07/2023.   EP/Cardiology Office Visits:  Recall 10/05/2023 with Dr Nelly Laurence.     Copy of ICM check sent to Dr. Elberta Fortis.   3 month ICM trend: 06/25/2023.    12-14 Month ICM trend:     Karie Soda, RN 06/25/2023 3:31 PM

## 2023-07-07 ENCOUNTER — Ambulatory Visit (INDEPENDENT_AMBULATORY_CARE_PROVIDER_SITE_OTHER): Payer: Medicare PPO

## 2023-07-07 DIAGNOSIS — I255 Ischemic cardiomyopathy: Secondary | ICD-10-CM | POA: Diagnosis not present

## 2023-07-08 LAB — CUP PACEART REMOTE DEVICE CHECK
Battery Remaining Longevity: 36 mo
Battery Remaining Percentage: 50 %
Battery Voltage: 2.92 V
Brady Statistic AP VP Percent: 91 %
Brady Statistic AP VS Percent: 1 %
Brady Statistic AS VP Percent: 8.6 %
Brady Statistic AS VS Percent: 1 %
Brady Statistic RA Percent Paced: 91 %
Date Time Interrogation Session: 20241223020028
HighPow Impedance: 71 Ohm
Implantable Lead Connection Status: 753985
Implantable Lead Connection Status: 753985
Implantable Lead Connection Status: 753985
Implantable Lead Implant Date: 20201117
Implantable Lead Implant Date: 20201117
Implantable Lead Implant Date: 20201117
Implantable Lead Location: 753858
Implantable Lead Location: 753859
Implantable Lead Location: 753860
Implantable Pulse Generator Implant Date: 20201117
Lead Channel Impedance Value: 410 Ohm
Lead Channel Impedance Value: 410 Ohm
Lead Channel Impedance Value: 750 Ohm
Lead Channel Pacing Threshold Amplitude: 0.625 V
Lead Channel Pacing Threshold Amplitude: 1 V
Lead Channel Pacing Threshold Amplitude: 2.25 V
Lead Channel Pacing Threshold Pulse Width: 0.5 ms
Lead Channel Pacing Threshold Pulse Width: 0.5 ms
Lead Channel Pacing Threshold Pulse Width: 0.5 ms
Lead Channel Sensing Intrinsic Amplitude: 1.8 mV
Lead Channel Sensing Intrinsic Amplitude: 11.8 mV
Lead Channel Setting Pacing Amplitude: 1.5 V
Lead Channel Setting Pacing Amplitude: 1.625
Lead Channel Setting Pacing Amplitude: 3.75 V
Lead Channel Setting Pacing Pulse Width: 0.5 ms
Lead Channel Setting Pacing Pulse Width: 0.5 ms
Lead Channel Setting Sensing Sensitivity: 0.5 mV
Pulse Gen Serial Number: 111012743
Zone Setting Status: 755011

## 2023-07-28 ENCOUNTER — Ambulatory Visit: Payer: Medicare PPO | Attending: Cardiology

## 2023-07-28 DIAGNOSIS — Z95810 Presence of automatic (implantable) cardiac defibrillator: Secondary | ICD-10-CM

## 2023-07-28 DIAGNOSIS — I5042 Chronic combined systolic (congestive) and diastolic (congestive) heart failure: Secondary | ICD-10-CM | POA: Diagnosis not present

## 2023-07-28 NOTE — Progress Notes (Signed)
 EPIC Encounter for ICM Monitoring  Patient Name: Anthony Daugherty is a 78 y.o. male Date: 07/28/2023 Primary Care Physican: Candise Aleene DEL, MD Primary Cardiologist: Shlomo Electrophysiologist: Inocencio Pore Pacing: >99%         11/14/2021 Office Weight: 160 lbs 04/17/2023 Weight: 171 lbs 07/28/2023 Weight: 171 lbs   AT/AF Burden: <1%                                                            Spoke with wife and heart failure questions reviewed.  Transmission results reviewed.  Pt asymptomatic for fluid accumulation.  Reports feeling well at this time and voices no complaints.      Diet:  Does not limit salt intake and uses salt shaker.   CorVue thoracic impedance suggesting intermittent days possible fluid accumulation starting 1/6 and currently trending back toward baseline 1/13.   Prescribed: Potassium 20 mEq take 1 tablet by mouth daily Spironolactone  25 mg take 1 tablet by mouth daily    Labs: 10/09/2022 Creatinine 0.92, BUN 11, Potassium 4.1, Sodium 139, GFR 81.03 A complete set of results can be found in Results Review.   Recommendations:  Advised to encourage patient to limit salt intake.     Follow-up plan: ICM clinic phone appointment on 08/05/2023 to recheck.   91 day device clinic remote transmission 10/06/2023.   EP/Cardiology Office Visits:  08/19/2023 with Dr Shlomo.  Recall 10/05/2023 with Dr Nancey.     Copy of ICM check sent to Dr. Inocencio.   3 month ICM trend: 07/28/2023.    12-14 Month ICM trend:     [name], RN 07/28/2023 4:00 PM

## 2023-08-05 ENCOUNTER — Ambulatory Visit: Payer: Medicare PPO | Attending: Cardiology

## 2023-08-05 DIAGNOSIS — Z95810 Presence of automatic (implantable) cardiac defibrillator: Secondary | ICD-10-CM

## 2023-08-05 DIAGNOSIS — I5042 Chronic combined systolic (congestive) and diastolic (congestive) heart failure: Secondary | ICD-10-CM

## 2023-08-06 NOTE — Progress Notes (Signed)
EPIC Encounter for ICM Monitoring  Patient Name: Anthony Daugherty is a 78 y.o. male Date: 08/06/2023 Primary Care Physican: Jeoffrey Massed, MD Primary Cardiologist: Mayford Knife Electrophysiologist: Kathreen Cornfield Pacing: >99%         11/14/2021 Office Weight: 160 lbs 04/17/2023 Weight: 171 lbs 07/28/2023 Weight: 171 lbs   AT/AF Burden: <1%                                                            Spoke with wife and heart failure questions reviewed.  Transmission results reviewed.  Pt asymptomatic for fluid accumulation.  Reports feeling well at this time and voices no complaints.      Diet:  Does not limit salt intake and uses salt shaker.    CorVue thoracic impedance suggesting fluid levels returned to normal.   Prescribed: Potassium 20 mEq take 1 tablet by mouth daily Spironolactone 25 mg take 1 tablet by mouth daily    Labs: 10/09/2022 Creatinine 0.92, BUN 11, Potassium 4.1, Sodium 139, GFR 81.03 A complete set of results can be found in Results Review.   Recommendations:  No changes and encouraged to call if experiencing any fluid symptoms.   Follow-up plan: ICM clinic phone appointment on 09/01/2023.   91 day device clinic remote transmission 10/06/2023.   EP/Cardiology Office Visits:  08/19/2023 with Dr Mayford Knife.  Recall 10/05/2023 with Dr Nelly Laurence.     Copy of ICM check sent to Dr. Elberta Fortis.   3 month ICM trend: 08/05/2023.    12-14 Month ICM trend:     Karie Soda, RN 08/06/2023 4:35 PM

## 2023-08-18 ENCOUNTER — Encounter: Payer: Self-pay | Admitting: *Deleted

## 2023-08-18 NOTE — Progress Notes (Signed)
 Remote ICD transmission.

## 2023-08-18 NOTE — Addendum Note (Signed)
Addended by: Geralyn Flash D on: 08/18/2023 11:43 AM   Modules accepted: Orders

## 2023-08-19 ENCOUNTER — Encounter: Payer: Self-pay | Admitting: Cardiology

## 2023-08-19 ENCOUNTER — Ambulatory Visit: Payer: Medicare PPO | Attending: Cardiology | Admitting: Cardiology

## 2023-08-19 VITALS — BP 100/62 | HR 65 | Ht 67.0 in | Wt 171.8 lb

## 2023-08-19 DIAGNOSIS — I359 Nonrheumatic aortic valve disorder, unspecified: Secondary | ICD-10-CM

## 2023-08-19 DIAGNOSIS — I77810 Thoracic aortic ectasia: Secondary | ICD-10-CM

## 2023-08-19 DIAGNOSIS — E7800 Pure hypercholesterolemia, unspecified: Secondary | ICD-10-CM | POA: Diagnosis not present

## 2023-08-19 DIAGNOSIS — I10 Essential (primary) hypertension: Secondary | ICD-10-CM | POA: Diagnosis not present

## 2023-08-19 DIAGNOSIS — I5042 Chronic combined systolic (congestive) and diastolic (congestive) heart failure: Secondary | ICD-10-CM | POA: Diagnosis not present

## 2023-08-19 DIAGNOSIS — I2510 Atherosclerotic heart disease of native coronary artery without angina pectoris: Secondary | ICD-10-CM

## 2023-08-19 DIAGNOSIS — I255 Ischemic cardiomyopathy: Secondary | ICD-10-CM

## 2023-08-19 DIAGNOSIS — Z79899 Other long term (current) drug therapy: Secondary | ICD-10-CM | POA: Diagnosis not present

## 2023-08-19 NOTE — Patient Instructions (Signed)
Medication Instructions:  Your physician recommends that you continue on your current medications as directed. Please refer to the Current Medication list given to you today.  *If you need a refill on your cardiac medications before your next appointment, please call your pharmacy*   Lab Work: Please complete a FASTING lipid panel and an ALT today at the [facility] on the first floor before you leave today.  If you have labs (blood work) drawn today and your tests are completely normal, you will receive your results only by: MyChart Message (if you have MyChart) OR A paper copy in the mail If you have any lab test that is abnormal or we need to change your treatment, we will call you to review the results.   Testing/Procedures: Your physician has requested that you have an echocardiogram in September of 2025. Echocardiography is a painless test that uses sound waves to create images of your heart. It provides your doctor with information about the size and shape of your heart and how well your heart's chambers and valves are working. This procedure takes approximately one hour. There are no restrictions for this procedure. Please do NOT wear cologne, perfume, aftershave, or lotions (deodorant is allowed). Please arrive 15 minutes prior to your appointment time.  Please note: We ask at that you not bring children with you during ultrasound (echo/ vascular) testing. Due to room size and safety concerns, children are not allowed in the ultrasound rooms during exams. Our front office staff cannot provide observation of children in our lobby area while testing is being conducted. An adult accompanying a patient to their appointment will only be allowed in the ultrasound room at the discretion of the ultrasound technician under special circumstances. We apologize for any inconvenience.    Follow-Up: At [facility], you and your health needs are our priority.  As part of our continuing  mission to provide you with exceptional heart care, we have created designated Provider Care Teams.  These Care Teams include your primary Cardiologist (physician) and Advanced Practice Providers (APPs -  Physician Assistants and Nurse Practitioners) who all work together to provide you with the care you need, when you need it.  We recommend signing up for the patient portal called "MyChart".  Sign up information is provided on this After Visit Summary.  MyChart is used to connect with patients for Virtual Visits (Telemedicine).  Patients are able to view lab/test results, encounter notes, upcoming appointments, etc.  Non-urgent messages can be sent to your provider as well.   To learn more about what you can do with MyChart, go to [email or web address].    Your next appointment:   6 month(s)  Provider:   Armanda Magic, MD

## 2023-08-19 NOTE — Progress Notes (Signed)
 Date:  08/19/2023   ID:  [name], DOB 05-03-1946, MRN [id number]  Patient Location:  [facility]  Provider location:   [place]  PCP:  Anthony Aleene DEL, MD  Cardiologist:  Anthony Bihari, MD  Electrophysiologist:  Anthony FORBES Furbish, MD   Chief Complaint:  DCM and AI  History of Present Illness: He   Anthony Daugherty is a 78 y.o. male  with a history of aortic stenosis and AR with dilated aortic root.  He apparently had an echo 07/2017 showing mild aortic stenosis and mild to moderate AR and aortic root 43mm.  He also has a history of HTN, hyperlipidemia, nonischemic DCM with EF 40-45 by cath and echo in 2012 and ASCAD with 80% OM lesion medically managed.    Repeat echo 08/2018 showed moderate AR with aortic root 38mm.  Unfortunately his echo also shows that his LVF has significantly declined with EF 20-25% with mildly dilated LV and diffuse HK.  There was severe LAE and moderate RAE and mild pulmonary HTN. At his initial OV in 10/2018, his Bystolic  was changed to Carvedilol  and he was continued on ARB.    He underwent cath 12/2018 showing 95% pRCA, 75% OM1 and 50% prox to mid LAD.  He underwent attempted PCI of the RCA but unable to cross the lesion and medical therapy was recommended.  It was felt that his CM was mixed ICM/NICM with possible ETOH contribution as well.  He has seen my extenders several times trying to titrate HF meds but has been limited by soft Bps.  He was on Entresto  49/51mg  BID, Carvedilol  25mg  BID and repeat echo 04/2019 showed EF 25-30% with mild AS and moderate to severe AI.  There was no M spike on SPEP/UPEP and TSH and ferritin were normal.  He was referred to EP and underwent [employer] BiV ICD on 06/01/2019.    2D echo 05/2020 showed improved LVF after BiVICD with EF 45-50%, mild AS and moderate AR, mild MR. He was seen by Dr. Wonda in June 2022 for moderate to severe aortic insufficiency with mildly dilated aortic root.  He had a repeat cardiac MRI MRA  showing EF 45 to 50% with severe aortic insufficiency and holo diastolic flow reversal in the descending aorta.  Dr. Wonda saw him back in consultation and given the fact that he was asymptomatic, LV dimensions were normal and given his significant dementia that he had developed and the fact that the patient did not want surgery he felt patient should be followed going forward with conservative management with no plans for any type of invasive procedure on his aortic valve.  He is here today for followup and is doing well.  He denies any chest pain or pressure, SOB, DOE, PND, orthopnea, LE edema, dizziness, palpitations or syncope. He is compliant with his meds and is tolerating meds with no SE.    Prior CV studies:   The following studies were reviewed today:  2D echo 12/2021 IMPRESSIONS   1. Left ventricular ejection fraction, by estimation, is 45 to 50%. Left  ventricular ejection fraction by 3D volume is 46 %. The left ventricle has  mildly decreased function. The left ventricle demonstrates global  hypokinesis. The left ventricular  internal cavity size was mildly dilated. There is mild left ventricular  hypertrophy. Left ventricular diastolic parameters are consistent with  Grade I diastolic dysfunction (impaired relaxation).   2. Right ventricular systolic function is normal. The right ventricular  size is  normal. There is normal pulmonary artery systolic pressure. The  estimated right ventricular systolic pressure is 31.7 mmHg.   3. Left atrial size was mildly dilated.   4. The mitral valve is grossly normal. Mild mitral valve regurgitation.  No evidence of mitral stenosis.   5. The aortic valve is abnormal. There is moderate calcification of the  aortic valve. Aortic valve regurgitation is severe, eccentric and  posteriorly directed. Holodiastolic flow reversal noted in descending  aorta Doppler. Mild aortic valve stenosis.  Aortic valve mean gradient measures 10.7 mmHg.   6.  Aortic dilatation noted. There is mild dilatation of the aortic root,  measuring 40 mm.   7. The inferior vena cava is normal in size with greater than 50%  respiratory variability, suggesting right atrial pressure of 3 mmHg.   Past Medical History:  Diagnosis Date   Amnestic MCI (mild cognitive impairment with memory loss) 08/09/2021   Aortic insufficiency 10/21/2018   Mild-mod on echo 07/2017.  Moderate 08/2018.  Moderate-severe on echo 04/2019--> TEE 11/15/19 EF 50-55%, no LV dilation or WMA. Mod AV sclerosis w/out stenosis, mod-to-sev AI. 11/2020 moderate AI. severe eccentric by echo 12/2021.  Severe on echo 12/2021->cardiac MRI confirmed.  Moderate on echo 03/2023   Aortic root dilatation 10/2018   11/17/19 45 mm aortic root at the sinuses of Valsalva-> 11/2020 echo->40 mm, unchanged on echo 03/2023   Aortic stenosis, mild    mild to moderate by echo 03/2023 with mean AVG and DVI   Arthritis    L-Spine   Cardiac murmur    Systolic and diastolic as of 07/2017.   Cardiomyopathy 10/12/2010   Global hypokinesis, EF 40-45% by cath and echo (2012 echo and 2019 echo).  Feb 2020 EF 20-25%. - mixed ICM/NICM (alcohol and CAD)  October 2020 EF 25-30%. 11/2020 EF 40-45%. 06/2021 EF 45-50%,dil LV+hypokinesis,mod AI,grd I DD.   Chronic systolic (congestive) heart failure 06/01/2019   max med mgmt, plus CRT-D placed 05/2019, EF improved to 50% after. Cardiac MRI 03/2022 EF 40%   Coronary artery disease 11/16/2010   a. 2012 - 80% OM lesion. b. Cath 12/2018 - showing 95% prox RCA, 75% OM1, 50% prox-mid LAD. Unsuccessful PCI to 100% chronically occluded RCA summer 2020. CT angio chest w/aorta 08/2021->severe coronary calc's, no aneurism. Mild subclav stenosis d/t athero   Diverticulosis    Erectile dysfunction 08/16/2011   Essential hypertension 07/19/2010   GAD (generalized anxiety disorder)    managed by Dr. Vincente with prozac  20mg  qd   Gout    Usually Right great toe; colchicine  helps well   Gouty arthritis  of toe of left foot 05/27/2013   Habitual alcohol use    History of colonoscopy    Initial was normal; 2014-polypectomy and diverticulosis.  Recall 2019.   Hyperlipidemia 10/23/2012   LBBB (left bundle branch block)    CRT-D placed 05/2019   Low back strain 10/24/2013   Multiple lacunar infarcts 11/15/2021   Right caudate nucleus (acute/subacute 11/2021); remote subcentimeter infarct within the inferior right cerebellar hemisphere   Obesity, Class I, BMI 30-34.9    Pulmonary nodule    0.5 cm RLL subpleural nodule.  Pt NEVER SMOKER.  Low risk->no f/u imaging is needed.   Subclavian artery stenosis, left    mild, detected on CT angio chest w/aorta 08/2021   Thyroid  nodule 04/23/2013   Past Surgical History:  Procedure Laterality Date   BIV ICD INSERTION CRT-D N/A 06/01/2019   Procedure: BIV ICD INSERTION CRT-D;  Surgeon: Kelsie Agent, MD;  Location: [facility] INVASIVE CV LAB;  Service: Cardiovascular;  Laterality: N/A;   CARDIAC CATHETERIZATION  10/12/2010   diagnostic only: 1 vessel CAD with TIMI 3 flow, EF 40-45%.  Mild AS.  Medical mgmt of CAD.   COLONOSCOPY     COLONOSCOPY W/ POLYPECTOMY  10/06/2012   Diverticulosis and tubular adenoma w/out high grade dysplasia.  Repeat 5 yrs (Dr. Debrah).   CORONARY ATHERECTOMY N/A 01/08/2019   100% chronic RCA occlusion impossible to clear.  Med mgmt. Procedure: CORONARY ATHERECTOMY;  Surgeon: Anthony Daugherty Sharper, MD;  Location: [facility] INVASIVE CV LAB;  Service: Cardiovascular;  Laterality: N/A;   POLYPECTOMY     RIGHT HEART CATH AND CORONARY ANGIOGRAPHY N/A 12/24/2018   95% RCA occlusion, 75% obtuse marginal occlusion (3V dz). Procedure: RIGHT HEART CATH AND CORONARY ANGIOGRAPHY;  Surgeon: Court Dorn PARAS, MD;  Location: [facility] INVASIVE CV LAB;  Service: Cardiovascular;  Laterality: N/A;   TEE WITHOUT CARDIOVERSION N/A 11/15/2019   EF 50-55%, no LV dilation or WMA. Mod AV sclerosis w/out stenosis, mod-to-sev AI.  Procedure: TRANSESOPHAGEAL ECHOCARDIOGRAM (TEE);   Surgeon: Okey Vina GAILS, MD;  Location: [facility] ENDOSCOPY;  Service: Cardiovascular;  Laterality: N/A;   TRANSTHORACIC ECHOCARDIOGRAM  09/2010; 07/2017; 08/2018; 10/2019;05/2020   2012:  EF 40-45%, global hypokinesis, mild AS.  07/2017: no chg except mild/mod AR.  08/2018 EF 20-25%, global LV hypok, DD, biatrial enlargemt, mod AR, aortic root 3.8 cm. 10/2019 EF 50%, mild dec RV fxn, mod-sev AR, aor root dil 42 mm. 05/2020 EF 45-50%,grd I DD, mod AI, aorta 38mm, severe LAE. 10/2020 EF 45-50%, mild LVH, global hypokin, grd I DD, mod AI. Echo 12/2021 EF 45-50%, severe AR   TRANSTHORACIC ECHOCARDIOGRAM     03/2023 EF 50-55%, mild MR, mod TR, mod AS and AR     Current Meds  Medication Sig   aspirin  EC 81 MG tablet Take 81 mg by mouth daily. Swallow whole.   atorvastatin  (LIPITOR) 40 MG tablet TAKE 1 TABLET DAILY   cyanocobalamin  (VITAMIN B12) 1000 MCG tablet Take 1,000 mcg by mouth daily.   donepezil  (ARICEPT ) 10 MG tablet Take 1 tablet 10 mg daily   empagliflozin  (JARDIANCE ) 10 MG TABS tablet Take 1 tablet (10 mg total) by mouth daily before breakfast.   FLUoxetine  (PROZAC ) 40 MG capsule Take 1 capsule (40 mg total) by mouth daily.   folic acid  (FOLVITE ) 1 MG tablet TAKE ONE TABLET ONCE DAILY   memantine  (NAMENDA ) 5 MG tablet Take 1 tablet (5 mg at night) for 2 weeks, then increase to 1 tablet (5 mg) twice a day   metoprolol  succinate (TOPROL  XL) 25 MG 24 hr tablet Take 1 tablet (25 mg total) by mouth daily.   potassium chloride  SA (KLOR-CON  M) 20 MEQ tablet Take 1 tablet (20 mEq total) by mouth daily.   sacubitril -valsartan  (ENTRESTO ) 97-103 MG TAKE 1 TABLET 2 TIMES A DAY   spironolactone  (ALDACTONE ) 25 MG tablet TAKE ONE TABLET BY MOUTH DAILY   thiamine  (VITAMIN B-1) 100 MG tablet Take 1 tablet (100 mg total) by mouth daily.   Vitamin D, Ergocalciferol, (DRISDOL) 1.25 MG (50000 UNIT) CAPS capsule Take 50,000 Units by mouth once a week.     Allergies:   Penicillins   Social History   Tobacco Use    Smoking status: Former   Smokeless tobacco: Current    Types: Chew   Tobacco comments:    chews tobacco  Vaping Use   Vaping status: Never Used  Substance  Use Topics   Alcohol use: Yes    Comment: 9/22 - 28 per week; 1/23 - 12 per week, 06/28/22 no drinks during past week   Drug use: No     Family Hx: The patient's family history includes Alzheimer's disease in his mother; COPD in his father; Congenital heart disease in his brother; Heart failure in his father. There is no history of Colon cancer, Esophageal cancer, Stomach cancer, Rectal cancer, or Colon polyps.  ROS:   Please see the history of present illness.     All other systems reviewed and are negative.   Labs/Other Tests and Data Reviewed:    EKG Interpretation Date/Time:  Tuesday August 19 2023 14:06:52 EST Ventricular Rate:  65 PR Interval:  194 QRS Duration:  146 QT Interval:  460 QTC Calculation: 478 R Axis:   228  Text Interpretation: AV dual-paced rhythm Biventricular pacemaker detected When compared with ECG of 02-Jun-2019 05:31, Premature ventricular complexes are no longer Present Confirmed by Shlomo Corning ([id number]) on 08/19/2023 2:27:59 PM    Recent Labs: 10/09/2022: ALT 16; Hemoglobin 14.9; Platelets 205.0 03/06/2023: BUN 12; Creatinine, Ser 0.97; Potassium 4.1; Sodium 143   Recent Lipid Panel Lab Results  Component Value Date/Time   CHOL 123 09/04/2022 01:28 PM   TRIG 76 09/04/2022 01:28 PM   TRIG 105 05/10/2008 12:00 AM   HDL 47 09/04/2022 01:28 PM   CHOLHDL 2.6 09/04/2022 01:28 PM   CHOLHDL 2 10/04/2021 11:25 AM   LDLCALC 61 09/04/2022 01:28 PM   LDLCALC 101 05/10/2008 12:00 AM    Wt Readings from Last 3 Encounters:  08/19/23 171 lb 12.8 oz (77.9 kg)  04/17/23 171 lb (77.6 kg)  03/06/23 172 lb 3.2 oz (78.1 kg)     Objective:    Vital Signs:  BP 100/62   Pulse 65   Ht 5' 7 (1.702 m)   Wt 171 lb 12.8 oz (77.9 kg)   SpO2 95%   BMI 26.91 kg/m   GEN: Well nourished, well developed in  no acute distress HEENT: Normal NECK: No JVD; No carotid bruits LYMPHATICS: No lymphadenopathy CARDIAC:RRR, no  rubs, gallops 1/6 SM at RUSB RESPIRATORY:  Clear to auscultation without rales, wheezing or rhonchi  ABDOMEN: Soft, non-tender, non-distended MUSCULOSKELETAL:  No edema; No deformity  SKIN: Warm and dry NEUROLOGIC:  Alert and oriented x 3 PSYCHIATRIC:  Normal affect  ASSESSMENT & PLAN:    1.  Severe mixed DCM -EF on last echo 05/2020 showed improved LVF with EF 45-50% after BiV AICD implant a year ago and EF continues to remain stable at 45 to 50% on echo 06/20/2021 and cardiac MRI 02/2022 -s/p CRT-D in 2020 with improvement in EF>>followed in device clinic -2D echo 6/23 with EF 45-50% and improved to 50-55% by echo 03/2023 -He does not appear volume overloaded today on exam -Continue prescription drug management with Toprol -XL 25 mg daily, Entresto  97-23 mg twice daily, Jardiance  10 mg daily, spironolactone  25 mg daily with as needed refills -I have personally reviewed and interpreted outside labs performed by patient's PCP which showed serum creatinine 0.97 and potassium 4.1 on 03/06/2023  2.  Chronic combined systolic/diastolic CHF -he is NYHA class 2a -Cardiac MRI 02/2022 showed EF 45 to 50%. -2D echo 04/04/2023 showed EF 50 to 55% with mild RVE mild MR, mild to moderate TR and moderate AR with mild to moderate aortic stenosis -Appears euvolemic on exam today -Continue Entresto , Toprol , Jardiance  and spironolactone   3.  Aortic valve disease -2D  echo 06-2021 shows stable low normal LV function with EF 45 to 50% with moderate to severe eccentric AI -mild AS with mean AVG 12 mmHg by echo 12/22 -Cardiac MRI/MRA 03/03/2022 showed severe aortic insufficiency with holodiastolic flow reversal also in the descending aorta and AI regurgitant fraction 45%  -He is being followed in structural heart clinic by Dr. Wonda.  Since his study did not show any LV dilatation and pulse pressure was  normal and he was asymptomatic he was not interested  at that time in pursuing cardiac surgery.  It was felt that because he was having a moderate problem he denies any angina with short-term memory impairment from Alzheimer's dementia it was felt that no further workup should be pursued for aortic valve replacement at this time and conservative measures were recommended. -Repeat echo 04/01/2023 revealed mild to moderate aortic stenosis and moderate AI with normal LV dimensions -Repeat 2D echo 03/2024  4.  Mild ascending aortic and aortic root dilatation -Chest CTA measuring aortic root at SOV 45mm at SOV, 36mm in ascending aorta and abdominal CTA with aortic atherosclerosis but no AAA -Cardiac MRI 03/13/2022 showed severe aortic insufficiency with regurgitant fraction 45% and holodiastolic flow reversal in the descending aorta and no thoracic aortic dilatation -continue statin and BP control -Ascending aorta measured 3.5 and aortic root 4.1 on echo 03/2023 -repeat echo 03/2023  5.  ASCAD - cath 12/2018 showing 95% pRCA, 75% OM1 and 50% prox to mid LAD.  He underwent attempted PCI of the RCA but unable to cross the lesion and medical therapy was recommended. -He has not had any symptoms since I saw him last -Continue prescription drug management with aspirin  81 mg daily, atorvastatin  40 mg daily, Toprol -XL 25 mg daily with as needed refills  6.  HLD -LDL goal < 70 -Continue prescription drug management with atorvastatin  40 mg daily with as needed refills -Check FLP and ALT  7.  HTN -BP is adequate controlled on exam today -Continue prescription drug management with Toprol -XL 25 mg daily, Entresto  97-23 mg twice daily and spironolactone  25 mg daily with PRN Refills   medication Adjustments/Labs and Tests Ordered: Current medicines are reviewed at length with the patient today.  Concerns regarding medicines are outlined above.  Tests Ordered: Orders Placed This Encounter  Procedures   EKG  12-Lead   Medication Changes: No orders of the defined types were placed in this encounter.   Disposition:  Follow up in 6 months  Signed, Anthony Bihari, MD  08/19/2023 2:19 PM    [facility]

## 2023-08-19 NOTE — Addendum Note (Signed)
Addended by: Luellen Pucker on: 08/19/2023 02:32 PM   Modules accepted: Orders

## 2023-08-20 LAB — LIPID PANEL
Chol/HDL Ratio: 3.2 {ratio} (ref 0.0–5.0)
Cholesterol, Total: 144 mg/dL (ref 100–199)
HDL: 45 mg/dL (ref >39)
LDL Chol Calc (NIH): 79 mg/dL (ref 0–99)
Triglycerides: 111 mg/dL (ref 0–149)
VLDL Cholesterol Cal: 20 mg/dL (ref 5–40)

## 2023-08-20 LAB — ALT: ALT: 14 [IU]/L (ref 0–44)

## 2023-08-25 ENCOUNTER — Telehealth: Payer: Self-pay

## 2023-08-25 ENCOUNTER — Other Ambulatory Visit: Payer: Self-pay

## 2023-08-25 DIAGNOSIS — Z79899 Other long term (current) drug therapy: Secondary | ICD-10-CM

## 2023-08-25 DIAGNOSIS — E7800 Pure hypercholesterolemia, unspecified: Secondary | ICD-10-CM

## 2023-08-25 MED ORDER — ATORVASTATIN CALCIUM 80 MG PO TABS: 80.0000 mg | ORAL_TABLET | Freq: Every day | ORAL | 3 refills | Status: AC

## 2023-08-25 NOTE — Telephone Encounter (Signed)
-----   Message from Gaylyn Keas sent at 08/20/2023 11:53 AM EST ----- LDL is not at goal please increase atorvastatin  to 80 mg daily and repeat FLP and ALT in 6 weeks

## 2023-08-25 NOTE — Telephone Encounter (Signed)
 Call to patient to explain LDL elevated. No answer, left message per DPR explaining LDL is not at goal and Dr. Micael Adas would like to increase atorvastatin  to 80 mg daily and repeat FLP and ALT in 6 weeks. Advised orders would be placed, explained patient can walk into any [facility] in late March to get fasting labs done. Asked patient to call our office if any questions.

## 2023-09-01 ENCOUNTER — Ambulatory Visit: Payer: Medicare PPO | Attending: Cardiology

## 2023-09-01 DIAGNOSIS — I5042 Chronic combined systolic (congestive) and diastolic (congestive) heart failure: Secondary | ICD-10-CM | POA: Diagnosis not present

## 2023-09-01 DIAGNOSIS — Z95810 Presence of automatic (implantable) cardiac defibrillator: Secondary | ICD-10-CM

## 2023-09-02 ENCOUNTER — Telehealth: Payer: Self-pay

## 2023-09-02 NOTE — Telephone Encounter (Signed)
 Remote ICM transmission received.  Attempted call to patient regarding ICM remote transmission and no answer.

## 2023-09-02 NOTE — Progress Notes (Signed)
 EPIC Encounter for ICM Monitoring  Patient Name: Anthony Daugherty is a 78 y.o. male Date: 09/02/2023 Primary Care Physican: Jeoffrey Massed, MD Primary Cardiologist: Mayford Knife Electrophysiologist: Kathreen Cornfield Pacing: >99%         11/14/2021 Office Weight: 160 lbs 04/17/2023 Weight: 171 lbs 07/28/2023 Weight: 171 lbs   AT/AF Burden: <1%                                                           Attempted call to patient and unable to reach.   Transmission results reviewed.    Diet:  Does not limit salt intake and uses salt shaker.    CorVue thoracic impedance suggesting fluid levels returned to normal.   Prescribed: Potassium 20 mEq take 1 tablet by mouth daily Spironolactone 25 mg take 1 tablet by mouth daily    Labs: 10/09/2022 Creatinine 0.92, BUN 11, Potassium 4.1, Sodium 139, GFR 81.03 A complete set of results can be found in Results Review.   Recommendations:  Unable to reach.     Follow-up plan: ICM clinic phone appointment on 10/07/2023.   91 day device clinic remote transmission 10/06/2023.   EP/Cardiology Office Visits:  Recall 02/15/2024 with Dr Mayford Knife.  Recall 10/05/2023 with Dr Nelly Laurence.     Copy of ICM check sent to Dr. Elberta Fortis.   3 month ICM trend: 09/02/2023.    12-14 Month ICM trend:     Karie Soda, RN 09/02/2023 4:16 PM

## 2023-09-17 ENCOUNTER — Ambulatory Visit: Payer: Medicare PPO | Admitting: *Deleted

## 2023-09-17 DIAGNOSIS — Z0000 Encounter for general adult medical examination without abnormal findings: Secondary | ICD-10-CM | POA: Diagnosis not present

## 2023-09-17 NOTE — Patient Instructions (Signed)
 Anthony Daugherty , Thank you for taking time to come for your Medicare Wellness Visit. I appreciate your ongoing commitment to your health goals. Please review the following plan we discussed and let me know if I can assist you in the future.   Screening recommendations/referrals: Colonoscopy: no longer required Recommended yearly ophthalmology/optometry visit for glaucoma screening and checkup Recommended yearly dental visit for hygiene and checkup  Vaccinations: Influenza vaccine: Education provided Pneumococcal vaccine: up to date Tdap vaccine: up to date Shingles vaccine: up to date       Preventive Care 65 Years and Older, Male Preventive care refers to lifestyle choices and visits with your health care provider that can promote health and wellness. What does preventive care include? A yearly physical exam. This is also called an annual well check. Dental exams once or twice a year. Routine eye exams. Ask your health care provider how often you should have your eyes checked. Personal lifestyle choices, including: Daily care of your teeth and gums. Regular physical activity. Eating a healthy diet. Avoiding tobacco and drug use. Limiting alcohol use. Practicing safe sex. Taking low doses of aspirin every day. Taking vitamin and mineral supplements as recommended by your health care provider. What happens during an annual well check? The services and screenings done by your health care provider during your annual well check will depend on your age, overall health, lifestyle risk factors, and family history of disease. Counseling  Your health care provider may ask you questions about your: Alcohol use. Tobacco use. Drug use. Emotional well-being. Home and relationship well-being. Sexual activity. Eating habits. History of falls. Memory and ability to understand (cognition). Work and work Astronomer. Screening  You may have the following tests or measurements: Height,  weight, and BMI. Blood pressure. Lipid and cholesterol levels. These may be checked every 5 years, or more frequently if you are over 60 years old. Skin check. Lung cancer screening. You may have this screening every year starting at age 14 if you have a 30-pack-year history of smoking and currently smoke or have quit within the past 15 years. Fecal occult blood test (FOBT) of the stool. You may have this test every year starting at age 20. Flexible sigmoidoscopy or colonoscopy. You may have a sigmoidoscopy every 5 years or a colonoscopy every 10 years starting at age 73. Prostate cancer screening. Recommendations will vary depending on your family history and other risks. Hepatitis C blood test. Hepatitis B blood test. Sexually transmitted disease (STD) testing. Diabetes screening. This is done by checking your blood sugar (glucose) after you have not eaten for a while (fasting). You may have this done every 1-3 years. Abdominal aortic aneurysm (AAA) screening. You may need this if you are a current or former smoker. Osteoporosis. You may be screened starting at age 73 if you are at high risk. Talk with your health care provider about your test results, treatment options, and if necessary, the need for more tests. Vaccines  Your health care provider may recommend certain vaccines, such as: Influenza vaccine. This is recommended every year. Tetanus, diphtheria, and acellular pertussis (Tdap, Td) vaccine. You may need a Td booster every 10 years. Zoster vaccine. You may need this after age 79. Pneumococcal 13-valent conjugate (PCV13) vaccine. One dose is recommended after age 60. Pneumococcal polysaccharide (PPSV23) vaccine. One dose is recommended after age 39. Talk to your health care provider about which screenings and vaccines you need and how often you need them. This information is not intended  to replace advice given to you by your health care provider. Make sure you discuss any  questions you have with your health care provider. Document Released: 07/28/2015 Document Revised: 03/20/2016 Document Reviewed: 05/02/2015 Elsevier Interactive Patient Education  2017 [employer].  Fall Prevention in the Home Falls can cause injuries. They can happen to people of all ages. There are many things you can do to make your home safe and to help prevent falls. What can I do on the outside of my home? Regularly fix the edges of walkways and driveways and fix any cracks. Remove anything that might make you trip as you walk through a door, such as a raised step or threshold. Trim any bushes or trees on the path to your home. Use bright outdoor lighting. Clear any walking paths of anything that might make someone trip, such as rocks or tools. Regularly check to see if handrails are loose or broken. Make sure that both sides of any steps have handrails. Any raised decks and porches should have guardrails on the edges. Have any leaves, snow, or ice cleared regularly. Use sand or salt on walking paths during winter. Clean up any spills in your garage right away. This includes oil or grease spills. What can I do in the bathroom? Use night lights. Install grab bars by the toilet and in the tub and shower. Do not use towel bars as grab bars. Use non-skid mats or decals in the tub or shower. If you need to sit down in the shower, use a plastic, non-slip stool. Keep the floor dry. Clean up any water that spills on the floor as soon as it happens. Remove soap buildup in the tub or shower regularly. Attach bath mats securely with double-sided non-slip rug tape. Do not have throw rugs and other things on the floor that can make you trip. What can I do in the bedroom? Use night lights. Make sure that you have a light by your bed that is easy to reach. Do not use any sheets or blankets that are too big for your bed. They should not hang down onto the floor. Have a firm chair that has side  arms. You can use this for support while you get dressed. Do not have throw rugs and other things on the floor that can make you trip. What can I do in the kitchen? Clean up any spills right away. Avoid walking on wet floors. Keep items that you use a lot in easy-to-reach places. If you need to reach something above you, use a strong step stool that has a grab bar. Keep electrical cords out of the way. Do not use floor polish or wax that makes floors slippery. If you must use wax, use non-skid floor wax. Do not have throw rugs and other things on the floor that can make you trip. What can I do with my stairs? Do not leave any items on the stairs. Make sure that there are handrails on both sides of the stairs and use them. Fix handrails that are broken or loose. Make sure that handrails are as long as the stairways. Check any carpeting to make sure that it is firmly attached to the stairs. Fix any carpet that is loose or worn. Avoid having throw rugs at the top or bottom of the stairs. If you do have throw rugs, attach them to the floor with carpet tape. Make sure that you have a light switch at the top of the stairs and  the bottom of the stairs. If you do not have them, ask someone to add them for you. What else can I do to help prevent falls? Wear shoes that: Do not have high heels. Have rubber bottoms. Are comfortable and fit you well. Are closed at the toe. Do not wear sandals. If you use a stepladder: Make sure that it is fully opened. Do not climb a closed stepladder. Make sure that both sides of the stepladder are locked into place. Ask someone to hold it for you, if possible. Clearly mark and make sure that you can see: Any grab bars or handrails. First and last steps. Where the edge of each step is. Use tools that help you move around (mobility aids) if they are needed. These include: Canes. Walkers. Scooters. Crutches. Turn on the lights when you go into a dark area.  Replace any light bulbs as soon as they burn out. Set up your furniture so you have a clear path. Avoid moving your furniture around. If any of your floors are uneven, fix them. If there are any pets around you, be aware of where they are. Review your medicines with your doctor. Some medicines can make you feel dizzy. This can increase your chance of falling. Ask your doctor what other things that you can do to help prevent falls. This information is not intended to replace advice given to you by your health care provider. Make sure you discuss any questions you have with your health care provider. Document Released: 04/27/2009 Document Revised: 12/07/2015 Document Reviewed: 08/05/2014 Elsevier Interactive Patient Education  2017 [employer].

## 2023-09-17 NOTE — Progress Notes (Signed)
 Subjective:   Anthony Daugherty is a 78 y.o. male who presents for Medicare Annual/Subsequent preventive examination.  Visit Complete: Virtual I connected with  Anthony Daugherty on 09/17/23 by a audio enabled telemedicine application and verified that I am speaking with the correct person using two identifiers.  Patient Location: Home  Provider Location: Home Office  I discussed the limitations of evaluation and management by telemedicine. The patient expressed understanding and agreed to proceed.  Vital Signs: Because this visit was a virtual/telehealth visit, some criteria may be missing or patient reported. Any vitals not documented were not able to be obtained and vitals that have been documented are patient reported.    Cardiac Risk Factors include: advanced age (>78men, >53 women);male gender;hypertension     Objective:    There were no vitals filed for this visit. There is no height or weight on file to calculate BMI.     06/23/2023    1:00 PM 12/19/2022    1:06 PM 09/11/2022    1:11 PM 06/19/2022   12:58 PM 12/13/2021   11:42 AM 11/14/2021   11:00 AM 09/11/2021    1:12 PM  Advanced Directives  Does Patient Have a Medical Advance Directive? Yes Yes Yes Yes Yes Yes Yes  Type of Sales promotion account executive of [employer] Power of [place];Living will      Does patient want to make changes to medical advance directive? No - Patient declined No - Patient declined       Copy of Healthcare Power of Attorney in Chart? No - copy requested No - copy requested No - copy requested        Current Medications (verified) Outpatient Encounter Medications as of 09/17/2023  Medication Sig   aspirin EC 81 MG tablet Take 81 mg by mouth daily. Swallow whole.   atorvastatin (LIPITOR) 80 MG tablet Take 1 tablet (80 mg total) by mouth daily.   cyanocobalamin (VITAMIN B12) 1000 MCG tablet Take 1,000 mcg by mouth daily.   donepezil (ARICEPT) 10 MG  tablet Take 1 tablet 10 mg daily   empagliflozin (JARDIANCE) 10 MG TABS tablet Take 1 tablet (10 mg total) by mouth daily before breakfast.   FLUoxetine (PROZAC) 40 MG capsule Take 1 capsule (40 mg total) by mouth daily.   folic acid (FOLVITE) 1 MG tablet TAKE ONE TABLET ONCE DAILY   memantine (NAMENDA) 5 MG tablet Take 1 tablet (5 mg at night) for 2 weeks, then increase to 1 tablet (5 mg) twice a day   metoprolol succinate (TOPROL XL) 25 MG 24 hr tablet Take 1 tablet (25 mg total) by mouth daily.   potassium chloride SA (KLOR-CON M) 20 MEQ tablet Take 1 tablet (20 mEq total) by mouth daily.   sacubitril-valsartan (ENTRESTO) 97-103 MG TAKE 1 TABLET 2 TIMES A DAY   spironolactone (ALDACTONE) 25 MG tablet TAKE ONE TABLET BY MOUTH DAILY   thiamine (VITAMIN B-1) 100 MG tablet Take 1 tablet (100 mg total) by mouth daily.   Vitamin D, Ergocalciferol, (DRISDOL) 1.25 MG (50000 UNIT) CAPS capsule Take 50,000 Units by mouth once a week.   No facility-administered encounter medications on file as of 09/17/2023.    Allergies (verified) Penicillins   History: Past Medical History:  Diagnosis Date   Amnestic MCI (mild cognitive impairment with memory loss) 08/09/2021   Aortic insufficiency 10/21/2018   Mild-mod on echo 07/2017.  Moderate 08/2018.  Moderate-severe on echo 04/2019--> TEE 11/15/19 EF 50-55%, no  LV dilation or WMA. Mod AV sclerosis w/out stenosis, mod-to-sev AI. 11/2020 moderate AI. severe eccentric by echo 12/2021.  Severe on echo 12/2021->cardiac MRI confirmed.  Moderate on echo 03/2023   Aortic root dilatation 10/2018   11/17/19 45 mm aortic root at the sinuses of Valsalva-> 11/2020 echo->40 mm, unchanged on echo 03/2023   Aortic stenosis, mild    mild to moderate by echo 03/2023 with mean AVG and DVI   Arthritis    L-Spine   Cardiac murmur    Systolic and diastolic as of 07/2017.   Cardiomyopathy 10/12/2010   Global hypokinesis, EF 40-45% by cath and echo (2012 echo and 2019 echo).  Feb  2020 EF 20-25%. - mixed ICM/NICM (alcohol and CAD)  October 2020 EF 25-30%. 11/2020 EF 40-45%. 06/2021 EF 45-50%,dil LV+hypokinesis,mod AI,grd I DD.   Chronic systolic (congestive) heart failure 06/01/2019   max med mgmt, plus CRT-D placed 05/2019, EF improved to 50% after. Cardiac MRI 03/2022 EF 40%   Coronary artery disease 11/16/2010   a. 2012 - 80% OM lesion. b. Cath 12/2018 - showing 95% prox RCA, 75% OM1, 50% prox-mid LAD. Unsuccessful PCI to 100% chronically occluded RCA summer 2020. CT angio chest w/aorta 08/2021->severe coronary calc's, no aneurism. Mild subclav stenosis d/t athero   Diverticulosis    Erectile dysfunction 08/16/2011   Essential hypertension 07/19/2010   GAD (generalized anxiety disorder)    managed by Dr. Evelene Croon with prozac 20mg  qd   Gout    Usually Right great toe; colchicine helps well   Gouty arthritis of toe of left foot 05/27/2013   Habitual alcohol use    History of colonoscopy    Initial was normal; 2014-polypectomy and diverticulosis.  Recall 2019.   Hyperlipidemia 10/23/2012   LBBB (left bundle branch block)    CRT-D placed 05/2019   Low back strain 10/24/2013   Multiple lacunar infarcts 11/15/2021   Right caudate nucleus (acute/subacute 11/2021); remote subcentimeter infarct within the inferior right cerebellar hemisphere   Obesity, Class I, BMI 30-34.9    Pulmonary nodule    0.5 cm RLL subpleural nodule.  Pt NEVER SMOKER.  Low risk->no f/u imaging is needed.   Subclavian artery stenosis, left    mild, detected on CT angio chest w/aorta 08/2021   Thyroid nodule 04/23/2013   Past Surgical History:  Procedure Laterality Date   BIV ICD INSERTION CRT-D N/A 06/01/2019   Procedure: BIV ICD INSERTION CRT-D;  Surgeon: Hillis Range, MD;  Location[facility] INVASIVE CV LAB;  Service: Cardiovascular;  Laterality: N/A;   CARDIAC CATHETERIZATION  10/12/2010   diagnostic only: 1 vessel CAD with TIMI 3 flow, EF 40-45%.  Mild AS.  Medical mgmt of CAD.   COLONOSCOPY      COLONOSCOPY W/ POLYPECTOMY  10/06/2012   Diverticulosis and tubular adenoma w/out high grade dysplasia.  Repeat 5 yrs (Dr. Arlyce Dice).   CORONARY ATHERECTOMY N/A 01/08/2019   100% chronic RCA occlusion impossible to clear.  Med mgmt. Procedure: CORONARY ATHERECTOMY;  Surgeon: Tonny Bollman, MD;  Location: [facility] INVASIVE CV LAB;  Service: Cardiovascular;  Laterality: N/A;   POLYPECTOMY     RIGHT HEART CATH AND CORONARY ANGIOGRAPHY N/A 12/24/2018   95% RCA occlusion, 75% obtuse marginal occlusion (3V dz). Procedure: RIGHT HEART CATH AND CORONARY ANGIOGRAPHY;  Surgeon: Runell Gess, MD;  Location: [facility] INVASIVE CV LAB;  Service: Cardiovascular;  Laterality: N/A;   TEE WITHOUT CARDIOVERSION N/A 11/15/2019   EF 50-55%, no LV dilation or WMA. Mod AV sclerosis w/out  stenosis, mod-to-sev AI.  Procedure: TRANSESOPHAGEAL ECHOCARDIOGRAM (TEE);  Surgeon: Pricilla Riffle, MD;  Location[name] ENDOSCOPY;  Service: Cardiovascular;  Laterality: N/A;   TRANSTHORACIC ECHOCARDIOGRAM  09/2010; 07/2017; 08/2018; 10/2019;05/2020   2012:  EF 40-45%, global hypokinesis, mild AS.  07/2017: no chg except mild/mod AR.  08/2018 EF 20-25%, global LV hypok, DD, biatrial enlargemt, mod AR, aortic root 3.8 cm. 10/2019 EF 50%, mild dec RV fxn, mod-sev AR, aor root dil 42 mm. 05/2020 EF 45-50%,grd I DD, mod AI, aorta 38mm, severe LAE. 10/2020 EF 45-50%, mild LVH, global hypokin, grd I DD, mod AI. Echo 12/2021 EF 45-50%, severe AR   TRANSTHORACIC ECHOCARDIOGRAM     03/2023 EF 50-55%, mild MR, mod TR, mod AS and AR   Family History  Problem Relation Age of Onset   Congenital heart disease Brother        cardiomyopathy   Alzheimer's disease Mother    COPD Father    Heart failure Father        congestive   Colon cancer Neg Hx    Esophageal cancer Neg Hx    Stomach cancer Neg Hx    Rectal cancer Neg Hx    Colon polyps Neg Hx    Social History   Socioeconomic History   Marital status: Married    Spouse name: Liborio Nixon   Number of children:  2   Years of education: 16   Highest education level: Bachelor's degree (e.g., BA, AB, BS)  Occupational History   Occupation: Retired    Associate Professor: [employer]  Tobacco Use   Smoking status: Former   Smokeless tobacco: Current    Types: Chew   Tobacco comments:    chews tobacco  Vaping Use   Vaping status: Never Used  Substance and Sexual Activity   Alcohol use: Yes    Comment: 9/22 - 28 per week; 1/23 - 12 per week, 06/28/22 no drinks during past week   Drug use: No   Sexual activity: Yes  Other Topics Concern   Not on file  Social History Narrative   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  Married, two children.   Pt denies ever smoking cigarettes.   +Alcohol.   Lives in [place].   Right handed   Drinks caffeine   Two story home   Social Drivers of Health   Financial Resource Strain: Low Risk  (09/17/2023)   Overall Financial Resource Strain (CARDIA)    Difficulty of Paying Living Expenses: Not hard at all  Food Insecurity: No Food Insecurity (09/17/2023)   Hunger Vital Sign    Worried About Running Out of Food in the Last Year: Never true    Ran Out of Food in the Last Year: Never true  Transportation Needs: No Transportation Needs (09/17/2023)   PRAPARE - Administrator, Civil Service (Medical): No    Lack of Transportation (Non-Medical): No  Physical Activity: Inactive (09/17/2023)   Exercise Vital Sign    Days of Exercise per Week: 0 days    Minutes of Exercise per Session: 0 min  Stress: No Stress Concern Present (09/17/2023)   [employer] of Occupational Health - Occupational Stress Questionnaire    Feeling of Stress : Not at all  Social Connections: Moderately Integrated (09/17/2023)   Social Connection and Isolation Panel [NHANES]    Frequency of Communication with Friends and Family: More than three times a week    Frequency of Social Gatherings with Friends and Family: Twice  a week    Attends Religious Services: More than 4 times per  year    Active Member of Clubs or Organizations: No    Attends Banker Meetings: Never    Marital Status: Married    Tobacco Counseling Ready to quit: Not Answered Counseling given: Not Answered Tobacco comments: chews tobacco   Clinical Intake:  Pre-visit preparation completed: Yes  Pain : No/denies pain     Diabetes: No  How often do you need to have someone help you when you read instructions, pamphlets, or other written materials from your doctor or pharmacy?: 1 - Never  Interpreter Needed?: No  Information entered by :: Remi Haggard LPN   Activities of Daily Living    09/17/2023   11:39 AM  In your present state of health, do you have any difficulty performing the following activities:  Hearing? 0  Vision? 0  Difficulty concentrating or making decisions? 0  Walking or climbing stairs? 0  Doing errands, shopping? 1  Preparing Food and eating ? N  Using the Toilet? N  In the past six months, have you accidently leaked urine? N  Do you have problems with loss of bowel control? N  Managing your Medications? N  Managing your Finances? N  Housekeeping or managing your Housekeeping? N    Patient Care Team: Jeoffrey Massed, MD as PCP - General Quintella Reichert, MD as PCP - Cardiology (Cardiology) Mealor, Roberts Gaudy, MD as PCP - Electrophysiology (Cardiology) Louis Meckel, MD (Inactive) as Consulting Physician (Gastroenterology) Milagros Evener, MD as Consulting Physician (Psychiatry) Maryclare Labrador (Dentistry)  Indicate any recent Medical Services you may have received from other than [facility] providers in the past year (date may be approximate).     Assessment:   This is a routine wellness examination for [place].  Hearing/Vision screen Hearing Screening - Comments:: No trouble hearing Vision Screening - Comments:: Up to date Unsure of name   Goals Addressed   None    Depression Screen    09/17/2023   11:44 AM 04/17/2023    1:29 PM 09/11/2022     1:10 PM 08/29/2021    2:43 PM 08/23/2020    3:05 PM 09/21/2019   10:35 AM 08/03/2018    9:55 AM  PHQ 2/9 Scores  PHQ - 2 Score 0 0 0 0 0 0 0  PHQ- 9 Score 1          Fall Risk    09/17/2023   11:38 AM 06/23/2023    1:00 PM 04/17/2023    1:28 PM 12/19/2022    1:05 PM 09/11/2022    1:12 PM  Fall Risk   Falls in the past year? 0 0 0 0 0  Number falls in past yr: 0 0  0 0  Injury with Fall? 0 0  0 0  Risk for fall due to :     Impaired vision  Follow up Falls evaluation completed;Education provided;Falls prevention discussed Falls evaluation completed Falls evaluation completed Falls evaluation completed Falls prevention discussed    MEDICARE RISK AT HOME: Medicare Risk at Home Any stairs in or around the home?: Yes If so, are there any without handrails?: No Home free of loose throw rugs in walkways, pet beds, electrical cords, etc?: Yes Adequate lighting in your home to reduce risk of falls?: Yes Life alert?: No Use of a cane, walker or w/c?: No Grab bars in the bathroom?: Yes Shower chair or bench in shower?: No Elevated toilet  seat or a handicapped toilet?: Yes  TIMED UP AND GO:  Was the test performed?  No    Cognitive Function:    06/23/2023    4:00 PM 12/13/2021   12:00 PM 08/01/2017    8:57 AM  MMSE - Mini Mental State Exam  Orientation to time 1 5 5   Orientation to Place 5 5 5   Registration 3 3 3   Attention/ Calculation 5 5 3   Recall 1 2 3   Language- name 2 objects 2 2 2   Language- repeat 1 1 1   Language- follow 3 step command 3 3 3   Language- read & follow direction 1 1 1   Write a sentence 1 1 1   Copy design 1 1 1   Total score 24 29 28         09/17/2023   11:41 AM 08/29/2021    2:47 PM  6CIT Screen  What Year? 4 points 4 points  What month? 3 points 0 points  What time? 0 points 0 points  Count back from 20 0 points 0 points  Months in reverse 2 points 0 points  Repeat phrase 10 points 4 points  Total Score 19 points 8 points     Immunizations Immunization History  Administered Date(s) Administered   Fluad Quad(high Dose 65+) 03/23/2019, 04/03/2021, 04/11/2022   Influenza,inj,Quad PF,6+ Mos 04/23/2013   Influenza-Unspecified 04/21/2015, 04/11/2017, 05/01/2018, 04/17/2020   Moderna Covid-19 Fall Seasonal Vaccine 64yrs & older 04/24/2023   Moderna Covid-19 Vaccine Bivalent Booster 62yrs & up 04/03/2021   Moderna Sars-Covid-2 Vaccination 08/16/2019, 09/14/2019, 05/16/2020, 12/05/2020   Pneumococcal Conjugate-13 07/22/2014   Pneumococcal Polysaccharide-23 08/16/2011, 12/13/2016   Respiratory Syncytial Virus Vaccine,Recomb Aduvanted(Arexvy) 04/11/2022   Td 07/16/2007   Tdap 08/26/2017   Unspecified SARS-COV-2 Vaccination 05/14/2022   Zoster Recombinant(Shingrix) 02/25/2017, 04/11/2017    TDAP status: Up to date  Flu Vaccine status: Due, Education has been provided regarding the importance of this vaccine. Advised may receive this vaccine at local pharmacy or Health Dept. Aware to provide a copy of the vaccination record if obtained from local pharmacy or Health Dept. Verbalized acceptance and understanding.  Pneumococcal vaccine status: Up to date  Covid-19 vaccine status: Information provided on how to obtain vaccines.   Qualifies for Shingles Vaccine? No   Zostavax completed Yes   Shingrix Completed?: Yes  Screening Tests Health Maintenance  Topic Date Due   COVID-19 Vaccine (8 - 2024-25 season) 06/19/2023   INFLUENZA VACCINE  10/13/2023 (Originally 02/13/2023)   Colonoscopy  04/15/2024 (Originally 10/09/2017)   Medicare Annual Wellness (AWV)  09/16/2024   DTaP/Tdap/Td (3 - Td or Tdap) 08/27/2027   Pneumonia Vaccine 57+ Years old  Completed   Hepatitis C Screening  Completed   Zoster Vaccines- Shingrix  Completed   HPV VACCINES  Aged Out    Health Maintenance  Health Maintenance Due  Topic Date Due   COVID-19 Vaccine (8 - 2024-25 season) 06/19/2023    Colorectal cancer screening: No longer  required.   Lung Cancer Screening: (Low Dose CT Chest recommended if Age 25-80 years, 20 pack-year currently smoking OR have quit w/in 15years.) does not qualify.   Lung Cancer Screening Referral:  Additional Screening:  Hepatitis C Screening: does not qualify; Completed 2015  Vision Screening: Recommended annual ophthalmology exams for early detection of glaucoma and other disorders of the eye. Is the patient up to date with their annual eye exam?  Yes  Who is the provider or what is the name of the office in which the  patient attends annual eye exams? Unsure of name If pt is not established with a provider, would they like to be referred to a provider to establish care? No .   Dental Screening: Recommended annual dental exams for proper oral hygiene    Community Resource Referral / Chronic Care Management: CRR required this visit?  No   CCM required this visit?  No     Plan:     I have personally reviewed and noted the following in the patient's chart:   Medical and social history Use of alcohol, tobacco or illicit drugs  Current medications and supplements including opioid prescriptions. Patient is not currently taking opioid prescriptions. Functional ability and status Nutritional status Physical activity Advanced directives List of other physicians Hospitalizations, surgeries, and ER visits in previous 12 months Vitals Screenings to include cognitive, depression, and falls Referrals and appointments  In addition, I have reviewed and discussed with patient certain preventive protocols, quality metrics, and best practice recommendations. A written personalized care plan for preventive services as well as general preventive health recommendations were provided to patient.     Remi Haggard, LPN   6/0/4540   After Visit Summary: (MyChart) Due to this being a telephonic visit, the after visit summary with patients personalized plan was offered to patient via MyChart    Nurse Notes:

## 2023-10-06 ENCOUNTER — Ambulatory Visit (INDEPENDENT_AMBULATORY_CARE_PROVIDER_SITE_OTHER): Payer: Medicare PPO

## 2023-10-06 DIAGNOSIS — E559 Vitamin D deficiency, unspecified: Secondary | ICD-10-CM | POA: Diagnosis not present

## 2023-10-06 DIAGNOSIS — I5042 Chronic combined systolic (congestive) and diastolic (congestive) heart failure: Secondary | ICD-10-CM

## 2023-10-06 DIAGNOSIS — I255 Ischemic cardiomyopathy: Secondary | ICD-10-CM

## 2023-10-06 DIAGNOSIS — E7800 Pure hypercholesterolemia, unspecified: Secondary | ICD-10-CM | POA: Diagnosis not present

## 2023-10-06 DIAGNOSIS — Z79899 Other long term (current) drug therapy: Secondary | ICD-10-CM | POA: Diagnosis not present

## 2023-10-06 LAB — CUP PACEART REMOTE DEVICE CHECK
Battery Remaining Longevity: 34 mo
Battery Remaining Percentage: 46 %
Battery Voltage: 2.92 V
Brady Statistic AP VP Percent: 91 %
Brady Statistic AP VS Percent: 1 %
Brady Statistic AS VP Percent: 8.6 %
Brady Statistic AS VS Percent: 1 %
Brady Statistic RA Percent Paced: 91 %
Date Time Interrogation Session: 20250324030023
HighPow Impedance: 71 Ohm
Implantable Lead Connection Status: 753985
Implantable Lead Connection Status: 753985
Implantable Lead Connection Status: 753985
Implantable Lead Implant Date: 20201117
Implantable Lead Implant Date: 20201117
Implantable Lead Implant Date: 20201117
Implantable Lead Location: 753858
Implantable Lead Location: 753859
Implantable Lead Location: 753860
Implantable Pulse Generator Implant Date: 20201117
Lead Channel Impedance Value: 400 Ohm
Lead Channel Impedance Value: 400 Ohm
Lead Channel Impedance Value: 760 Ohm
Lead Channel Pacing Threshold Amplitude: 0.625 V
Lead Channel Pacing Threshold Amplitude: 1 V
Lead Channel Pacing Threshold Amplitude: 1.875 V
Lead Channel Pacing Threshold Pulse Width: 0.5 ms
Lead Channel Pacing Threshold Pulse Width: 0.5 ms
Lead Channel Pacing Threshold Pulse Width: 0.5 ms
Lead Channel Sensing Intrinsic Amplitude: 1.7 mV
Lead Channel Sensing Intrinsic Amplitude: 11.8 mV
Lead Channel Setting Pacing Amplitude: 1.5 V
Lead Channel Setting Pacing Amplitude: 1.625
Lead Channel Setting Pacing Amplitude: 3.375
Lead Channel Setting Pacing Pulse Width: 0.5 ms
Lead Channel Setting Pacing Pulse Width: 0.5 ms
Lead Channel Setting Sensing Sensitivity: 0.5 mV
Pulse Gen Serial Number: 111012743
Zone Setting Status: 755011

## 2023-10-07 ENCOUNTER — Telehealth: Payer: Self-pay

## 2023-10-07 ENCOUNTER — Ambulatory Visit: Attending: Cardiology

## 2023-10-07 DIAGNOSIS — I5042 Chronic combined systolic (congestive) and diastolic (congestive) heart failure: Secondary | ICD-10-CM | POA: Diagnosis not present

## 2023-10-07 DIAGNOSIS — Z95810 Presence of automatic (implantable) cardiac defibrillator: Secondary | ICD-10-CM | POA: Diagnosis not present

## 2023-10-07 LAB — ALT: ALT: 20 IU/L (ref 0–44)

## 2023-10-07 LAB — LIPID PANEL
Chol/HDL Ratio: 2.9 ratio (ref 0.0–5.0)
Cholesterol, Total: 135 mg/dL (ref 100–199)
HDL: 46 mg/dL (ref >39)
LDL Chol Calc (NIH): 71 mg/dL (ref 0–99)
Triglycerides: 98 mg/dL (ref 0–149)
VLDL Cholesterol Cal: 18 mg/dL (ref 5–40)

## 2023-10-07 NOTE — Telephone Encounter (Signed)
 Remote ICM transmission received.  Attempted call to patient regarding ICM remote transmission and left detailed message per DPR.  Left ICM phone number and advised to return call for any fluid symptoms or questions. Next ICM remote transmission scheduled 10/13/2023.

## 2023-10-07 NOTE — Progress Notes (Signed)
 EPIC Encounter for ICM Monitoring  Patient Name: Anthony Daugherty is a 78 y.o. male Date: 10/07/2023 Primary Care Physican: Jeoffrey Massed, MD Primary Cardiologist: Mayford Knife Electrophysiologist: Kathreen Cornfield Pacing: >99%         11/14/2021 Office Weight: 160 lbs 04/17/2023 Weight: 171 lbs 07/28/2023 Weight: 171 lbs   AT/AF Burden: <1%                                                           Attempted call to patient/wife and unable to reach.   Transmission results reviewed.    Diet:  Does not limit salt intake and uses salt shaker.    CorVue thoracic impedance suggesting possible fluid accumulation starting 3/17.   Prescribed: Potassium 20 mEq take 1 tablet by mouth daily Spironolactone 25 mg take 1 tablet by mouth daily    Labs: 10/09/2022 Creatinine 0.92, BUN 11, Potassium 4.1, Sodium 139, GFR 81.03 A complete set of results can be found in Results Review.   Recommendations:  Unable to reach.     Follow-up plan: ICM clinic phone appointment on 10/13/2023 to recheck fluid levels.   91 day device clinic remote transmission 01/05/2024.   EP/Cardiology Office Visits:  Recall 02/15/2024 with Dr Mayford Knife.  Recall 10/05/2023 with Dr Nelly Laurence.     Copy of ICM check sent to Dr. Elberta Fortis.   3 month ICM trend: 10/06/2023.    12-14 Month ICM trend:     Karie Soda, RN 10/07/2023 2:55 PM

## 2023-10-11 ENCOUNTER — Encounter: Payer: Self-pay | Admitting: Cardiology

## 2023-10-13 ENCOUNTER — Ambulatory Visit: Attending: Cardiology

## 2023-10-13 DIAGNOSIS — Z95810 Presence of automatic (implantable) cardiac defibrillator: Secondary | ICD-10-CM

## 2023-10-13 DIAGNOSIS — I5042 Chronic combined systolic (congestive) and diastolic (congestive) heart failure: Secondary | ICD-10-CM

## 2023-10-14 NOTE — Progress Notes (Signed)
 EPIC Encounter for ICM Monitoring  Patient Name: Anthony Daugherty is a 78 y.o. male Date: 10/14/2023 Primary Care Physican: Jeoffrey Massed, MD Primary Cardiologist: Mayford Knife Electrophysiologist: Kathreen Cornfield Pacing: >99%         11/14/2021 Office Weight: 160 lbs 04/17/2023 Weight: 171 lbs 07/28/2023 Weight: 171 lbs 08/19/2023 Office Weight: 171 lbs   AT/AF Burden: <1%                                                           Transmission results reviewed.    Diet:  Does not limit salt intake and uses salt shaker.    CorVue thoracic impedance suggesting fluid levels returned to normal 3/29.   Prescribed: Potassium 20 mEq take 1 tablet by mouth daily Spironolactone 25 mg take 1 tablet by mouth daily    Labs: 03/06/2023 Creatinine 0.97, BUN 12, Potassium 4.1, Sodium 143, GFR 81 10/09/2022 Creatinine 0.92, BUN 11, Potassium 4.1, Sodium 139, GFR 81.03 A complete set of results can be found in Results Review.   Recommendations:  No changes.   Follow-up plan: ICM clinic phone appointment on 11/10/2023.   91 day device clinic remote transmission 01/05/2024.   EP/Cardiology Office Visits:  Recall 02/15/2024 with Dr Mayford Knife.  Recall 10/05/2023 with Dr Nelly Laurence.     Copy of ICM check sent to Dr. Elberta Fortis.    3 month ICM trend: 10/13/2023.    12-14 Month ICM trend:     Karie Soda, RN 10/14/2023 2:40 PM

## 2023-10-16 ENCOUNTER — Encounter: Payer: Medicare PPO | Admitting: Family Medicine

## 2023-10-16 ENCOUNTER — Other Ambulatory Visit: Payer: Self-pay

## 2023-10-16 MED ORDER — SPIRONOLACTONE 25 MG PO TABS: 25.0000 mg | ORAL_TABLET | Freq: Every day | ORAL | 3 refills | Status: AC

## 2023-10-16 NOTE — Progress Notes (Deleted)
 Office Note 10/16/2023  CC: No chief complaint on file.   HPI:  Patient is a 78 y.o. male who is here for annual health maintenance exam and follow-up vitamin B12 deficiency, ischemic cardiomyopathy, and difficulty controlling anger.  At his follow-up visit 6 months ago we increased his Prozac to 40 mg a day.  ***  Past Medical History:  Diagnosis Date   Amnestic MCI (mild cognitive impairment with memory loss) 08/09/2021   Aortic insufficiency 10/21/2018   Mild-mod on echo 07/2017.  Moderate 08/2018.  Moderate-severe on echo 04/2019--> TEE 11/15/19 EF 50-55%, no LV dilation or WMA. Mod AV sclerosis w/out stenosis, mod-to-sev AI. 11/2020 moderate AI. severe eccentric by echo 12/2021.  Severe on echo 12/2021->cardiac MRI confirmed.  Moderate on echo 03/2023   Aortic root dilatation 10/2018   11/17/19 45 mm aortic root at the sinuses of Valsalva-> 11/2020 echo->40 mm, unchanged on echo 03/2023   Aortic stenosis, mild    mild to moderate by echo 03/2023 with mean AVG and DVI   Arthritis    L-Spine   Cardiac murmur    Systolic and diastolic as of 07/2017.   Cardiomyopathy 10/12/2010   Global hypokinesis, EF 40-45% by cath and echo (2012 echo and 2019 echo).  Feb 2020 EF 20-25%. - mixed ICM/NICM (alcohol and CAD)  October 2020 EF 25-30%. 11/2020 EF 40-45%. 06/2021 EF 45-50%,dil LV+hypokinesis,mod AI,grd I DD.   Chronic systolic (congestive) heart failure 06/01/2019   max med mgmt, plus CRT-D placed 05/2019, EF improved to 50% after. Cardiac MRI 03/2022 EF 40%   Coronary artery disease 11/16/2010   a. 2012 - 80% OM lesion. b. Cath 12/2018 - showing 95% prox RCA, 75% OM1, 50% prox-mid LAD. Unsuccessful PCI to 100% chronically occluded RCA summer 2020. CT angio chest w/aorta 08/2021->severe coronary calc's, no aneurism. Mild subclav stenosis d/t athero   Diverticulosis    Erectile dysfunction 08/16/2011   Essential hypertension 07/19/2010   GAD (generalized anxiety disorder)    managed by Dr.  Evelene Croon with prozac 20mg  qd   Gout    Usually Right great toe; colchicine helps well   Gouty arthritis of toe of left foot 05/27/2013   Habitual alcohol use    History of colonoscopy    Initial was normal; 2014-polypectomy and diverticulosis.  Recall 2019.   Hyperlipidemia 10/23/2012   LBBB (left bundle branch block)    CRT-D placed 05/2019   Low back strain 10/24/2013   Multiple lacunar infarcts 11/15/2021   Right caudate nucleus (acute/subacute 11/2021); remote subcentimeter infarct within the inferior right cerebellar hemisphere   Obesity, Class I, BMI 30-34.9    Pulmonary nodule    0.5 cm RLL subpleural nodule.  Pt NEVER SMOKER.  Low risk->no f/u imaging is needed.   Subclavian artery stenosis, left    mild, detected on CT angio chest w/aorta 08/2021   Thyroid nodule 04/23/2013    Past Surgical History:  Procedure Laterality Date   BIV ICD INSERTION CRT-D N/A 06/01/2019   Procedure: BIV ICD INSERTION CRT-D;  Surgeon: Hillis Range, MD;  Location: [facility] INVASIVE CV LAB;  Service: Cardiovascular;  Laterality: N/A;   CARDIAC CATHETERIZATION  10/12/2010   diagnostic only: 1 vessel CAD with TIMI 3 flow, EF 40-45%.  Mild AS.  Medical mgmt of CAD.   COLONOSCOPY     COLONOSCOPY W/ POLYPECTOMY  10/06/2012   Diverticulosis and tubular adenoma w/out high grade dysplasia.  Repeat 5 yrs (Dr. Arlyce Dice).   CORONARY ATHERECTOMY N/A 01/08/2019   100%  chronic RCA occlusion impossible to clear.  Med mgmt. Procedure: CORONARY ATHERECTOMY;  Surgeon: Tonny Bollman, MD;  Location: [facility] INVASIVE CV LAB;  Service: Cardiovascular;  Laterality: N/A;   POLYPECTOMY     RIGHT HEART CATH AND CORONARY ANGIOGRAPHY N/A 12/24/2018   95% RCA occlusion, 75% obtuse marginal occlusion (3V dz). Procedure: RIGHT HEART CATH AND CORONARY ANGIOGRAPHY;  Surgeon: Runell Gess, MD;  Location: [facility] INVASIVE CV LAB;  Service: Cardiovascular;  Laterality: N/A;   TEE WITHOUT CARDIOVERSION N/A 11/15/2019   EF 50-55%, no LV dilation or  WMA. Mod AV sclerosis w/out stenosis, mod-to-sev AI.  Procedure: TRANSESOPHAGEAL ECHOCARDIOGRAM (TEE);  Surgeon: Pricilla Riffle, MD;  Location: [facility] ENDOSCOPY;  Service: Cardiovascular;  Laterality: N/A;   TRANSTHORACIC ECHOCARDIOGRAM  09/2010; 07/2017; 08/2018; 10/2019;05/2020   2012:  EF 40-45%, global hypokinesis, mild AS.  07/2017: no chg except mild/mod AR.  08/2018 EF 20-25%, global LV hypok, DD, biatrial enlargemt, mod AR, aortic root 3.8 cm. 10/2019 EF 50%, mild dec RV fxn, mod-sev AR, aor root dil 42 mm. 05/2020 EF 45-50%,grd I DD, mod AI, aorta 38mm, severe LAE. 10/2020 EF 45-50%, mild LVH, global hypokin, grd I DD, mod AI. Echo 12/2021 EF 45-50%, severe AR   TRANSTHORACIC ECHOCARDIOGRAM     03/2023 EF 50-55%, mild MR, mod TR, mod AS and AR    Family History  Problem Relation Age of Onset   Congenital heart disease Brother        cardiomyopathy   Alzheimer's disease Mother    COPD Father    Heart failure Father        congestive   Colon cancer Neg Hx    Esophageal cancer Neg Hx    Stomach cancer Neg Hx    Rectal cancer Neg Hx    Colon polyps Neg Hx     Social History   Socioeconomic History   Marital status: Married    Spouse name: Liborio Nixon   Number of children: 2   Years of education: 16   Highest education level: Bachelor's degree (e.g., BA, AB, BS)  Occupational History   Occupation: Retired    Associate Professor: [employer]  Tobacco Use   Smoking status: Former   Smokeless tobacco: Current    Types: Chew   Tobacco comments:    chews tobacco  Vaping Use   Vaping status: Never Used  Substance and Sexual Activity   Alcohol use: Yes    Comment: 9/22 - 28 per week; 1/23 - 12 per week, 06/28/22 no drinks during past week   Drug use: No   Sexual activity: Yes  Other Topics Concern   Not on file  Social History Narrative   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  Married, two children.   Pt denies ever smoking cigarettes.   +Alcohol.   Lives in [place].   Right handed    Drinks caffeine   Two story home   Social Drivers of Health   Financial Resource Strain: Low Risk  (09/17/2023)   Overall Financial Resource Strain (CARDIA)    Difficulty of Paying Living Expenses: Not hard at all  Food Insecurity: No Food Insecurity (09/17/2023)   Hunger Vital Sign    Worried About Running Out of Food in the Last Year: Never true    Ran Out of Food in the Last Year: Never true  Transportation Needs: No Transportation Needs (09/17/2023)   PRAPARE - Transportation    Lack of Transportation (Medical): No    Lack of  Transportation (Non-Medical): No  Physical Activity: Inactive (09/17/2023)   Exercise Vital Sign    Days of Exercise per Week: 0 days    Minutes of Exercise per Session: 0 min  Stress: No Stress Concern Present (09/17/2023)   [employer] of Occupational Health - Occupational Stress Questionnaire    Feeling of Stress : Not at all  Social Connections: Moderately Integrated (09/17/2023)   Social Connection and Isolation Panel [NHANES]    Frequency of Communication with Friends and Family: More than three times a week    Frequency of Social Gatherings with Friends and Family: Twice a week    Attends Religious Services: More than 4 times per year    Active Member of [employer] or Organizations: No    Attends Banker Meetings: Never    Marital Status: Married  Catering manager Violence: Not At Risk (09/17/2023)   Humiliation, Afraid, Rape, and Kick questionnaire    Fear of Current or Ex-Partner: No    Emotionally Abused: No    Physically Abused: No    Sexually Abused: No    Outpatient Medications Prior to Visit  Medication Sig Dispense Refill   aspirin EC 81 MG tablet Take 81 mg by mouth daily. Swallow whole.     atorvastatin (LIPITOR) 80 MG tablet Take 1 tablet (80 mg total) by mouth daily. 90 tablet 3   cyanocobalamin (VITAMIN B12) 1000 MCG tablet Take 1,000 mcg by mouth daily.     donepezil (ARICEPT) 10 MG tablet Take 1 tablet 10 mg daily 90 tablet 3    empagliflozin (JARDIANCE) 10 MG TABS tablet Take 1 tablet (10 mg total) by mouth daily before breakfast. 90 tablet 3   FLUoxetine (PROZAC) 40 MG capsule Take 1 capsule (40 mg total) by mouth daily. 90 capsule 1   folic acid (FOLVITE) 1 MG tablet TAKE ONE TABLET ONCE DAILY 90 tablet 3   memantine (NAMENDA) 5 MG tablet Take 1 tablet (5 mg at night) for 2 weeks, then increase to 1 tablet (5 mg) twice a day 60 tablet 11   metoprolol succinate (TOPROL XL) 25 MG 24 hr tablet Take 1 tablet (25 mg total) by mouth daily. 90 tablet 3   potassium chloride SA (KLOR-CON M) 20 MEQ tablet Take 1 tablet (20 mEq total) by mouth daily. 90 tablet 1   sacubitril-valsartan (ENTRESTO) 97-103 MG TAKE 1 TABLET 2 TIMES A DAY 180 tablet 3   spironolactone (ALDACTONE) 25 MG tablet TAKE ONE TABLET BY MOUTH DAILY 90 tablet 3   thiamine (VITAMIN B-1) 100 MG tablet Take 1 tablet (100 mg total) by mouth daily. 90 tablet 3   Vitamin D, Ergocalciferol, (DRISDOL) 1.25 MG (50000 UNIT) CAPS capsule Take 50,000 Units by mouth once a week.     No facility-administered medications prior to visit.    Allergies  Allergen Reactions   Penicillins Swelling    Did it involve swelling of the face/tongue/throat, SOB, or low BP? No Did it involve sudden or severe rash/hives, skin peeling, or any reaction on the inside of your mouth or nose? No Did you need to seek medical attention at a hospital or doctor's office? No When did it last happen? ~10 years ago If all above answers are "NO", may proceed with cephalosporin use.     Review of Systems *** PE;    08/19/2023    2:03 PM 06/23/2023   12:57 PM 04/17/2023    1:26 PM  Vitals with BMI  Height 5\' 7"  5\' 6"   Weight 171 lbs 13 oz  171 lbs  BMI 26.9    Systolic 100 115 161  Diastolic 62 81 65  Pulse 65 80 65     *** Pertinent labs:  Lab Results  Component Value Date   TSH 3.45 04/12/2021   Lab Results  Component Value Date   WBC 5.0 10/09/2022   HGB 14.9 10/09/2022    HCT 43.5 10/09/2022   MCV 92.1 10/09/2022   PLT 205.0 10/09/2022   Lab Results  Component Value Date   CREATININE 0.97 03/06/2023   BUN 12 03/06/2023   NA 143 03/06/2023   K 4.1 03/06/2023   CL 108 (H) 03/06/2023   CO2 23 03/06/2023   Lab Results  Component Value Date   ALT 20 10/06/2023   AST 17 10/09/2022   ALKPHOS 77 10/09/2022   BILITOT 1.1 10/09/2022   Lab Results  Component Value Date   CHOL 135 10/06/2023   Lab Results  Component Value Date   HDL 46 10/06/2023   Lab Results  Component Value Date   LDLCALC 71 10/06/2023   Lab Results  Component Value Date   TRIG 98 10/06/2023   Lab Results  Component Value Date   CHOLHDL 2.9 10/06/2023   Lab Results  Component Value Date   PSA 2.54 08/01/2017   PSA 1.42 12/08/2015   PSA 1.59 07/22/2014   Lab Results  Component Value Date   VITAMINB12 196 (L) 10/09/2022   Lab Results  Component Value Date   FOLATE >23.8 10/09/2022    ASSESSMENT AND PLAN:   No problem-specific Assessment & Plan notes found for this encounter.  #1 health maintenance exam: Reviewed age and gender appropriate health maintenance issues (prudent diet, regular exercise, health risks of tobacco and excessive alcohol, use of seatbelts, fire alarms in home, use of sunscreen).  Also reviewed age and gender appropriate health screening as well as vaccine recommendations. Vaccines: ALL UTD. Labs: cbc,cmet,vit b12 Prostate ca screening: ->no further screening indicated due to age. Colon ca screening: adenoma 2014, is overdue for rpt colonoscopy-->he declines.  Hypercholesterolemia.  Followed by Dr. Mayford Knife.  Recent lipid panel at goal. Continue a atorvastatin 80 mg a day.  An After Visit Summary was printed and given to the patient.  FOLLOW UP:  No follow-ups on file.  Signed:  Santiago Bumpers, MD           10/16/2023

## 2023-10-29 ENCOUNTER — Other Ambulatory Visit: Payer: Self-pay | Admitting: Family Medicine

## 2023-11-10 ENCOUNTER — Encounter

## 2023-11-13 ENCOUNTER — Other Ambulatory Visit: Payer: Self-pay | Admitting: Cardiology

## 2023-11-13 ENCOUNTER — Other Ambulatory Visit: Payer: Self-pay | Admitting: Family Medicine

## 2023-11-14 NOTE — Progress Notes (Signed)
 No ICM remote transmission received for 11/10/2023 and next ICM transmission scheduled for 11/24/2023.

## 2023-11-17 ENCOUNTER — Encounter: Payer: Self-pay | Admitting: Family Medicine

## 2023-11-17 ENCOUNTER — Ambulatory Visit: Attending: Cardiology

## 2023-11-17 ENCOUNTER — Ambulatory Visit: Admitting: Family Medicine

## 2023-11-17 VITALS — BP 92/53 | HR 64 | Ht 67.0 in | Wt 175.0 lb

## 2023-11-17 DIAGNOSIS — F1011 Alcohol abuse, in remission: Secondary | ICD-10-CM | POA: Diagnosis not present

## 2023-11-17 DIAGNOSIS — R454 Irritability and anger: Secondary | ICD-10-CM | POA: Diagnosis not present

## 2023-11-17 DIAGNOSIS — I447 Left bundle-branch block, unspecified: Secondary | ICD-10-CM | POA: Diagnosis not present

## 2023-11-17 DIAGNOSIS — Z95810 Presence of automatic (implantable) cardiac defibrillator: Secondary | ICD-10-CM

## 2023-11-17 DIAGNOSIS — I5042 Chronic combined systolic (congestive) and diastolic (congestive) heart failure: Secondary | ICD-10-CM

## 2023-11-17 DIAGNOSIS — Z0000 Encounter for general adult medical examination without abnormal findings: Secondary | ICD-10-CM | POA: Diagnosis not present

## 2023-11-17 LAB — CBC WITH DIFFERENTIAL/PLATELET
Basophils Absolute: 0 10*3/uL (ref 0.0–0.1)
Basophils Relative: 0.5 % (ref 0.0–3.0)
Eosinophils Absolute: 0.2 10*3/uL (ref 0.0–0.7)
Eosinophils Relative: 3.4 % (ref 0.0–5.0)
HCT: 42.7 % (ref 39.0–52.0)
Hemoglobin: 14.4 g/dL (ref 13.0–17.0)
Lymphocytes Relative: 21 % (ref 12.0–46.0)
Lymphs Abs: 1.1 10*3/uL (ref 0.7–4.0)
MCHC: 33.9 g/dL (ref 30.0–36.0)
MCV: 93.9 fl (ref 78.0–100.0)
Monocytes Absolute: 0.4 10*3/uL (ref 0.1–1.0)
Monocytes Relative: 7.3 % (ref 3.0–12.0)
Neutro Abs: 3.5 10*3/uL (ref 1.4–7.7)
Neutrophils Relative %: 67.8 % (ref 43.0–77.0)
Platelets: 206 10*3/uL (ref 150.0–400.0)
RBC: 4.55 Mil/uL (ref 4.22–5.81)
RDW: 13.7 % (ref 11.5–15.5)
WBC: 5.2 10*3/uL (ref 4.0–10.5)

## 2023-11-17 LAB — COMPREHENSIVE METABOLIC PANEL WITH GFR
ALT: 15 U/L (ref 0–53)
AST: 13 U/L (ref 0–37)
Albumin: 4.1 g/dL (ref 3.5–5.2)
Alkaline Phosphatase: 78 U/L (ref 39–117)
BUN: 19 mg/dL (ref 6–23)
CO2: 27 meq/L (ref 19–32)
Calcium: 8.8 mg/dL (ref 8.4–10.5)
Chloride: 107 meq/L (ref 96–112)
Creatinine, Ser: 0.97 mg/dL (ref 0.40–1.50)
GFR: 75.45 mL/min (ref >60.00)
Glucose, Bld: 84 mg/dL (ref 70–99)
Potassium: 3.9 meq/L (ref 3.5–5.1)
Sodium: 142 meq/L (ref 135–145)
Total Bilirubin: 0.9 mg/dL (ref 0.2–1.2)
Total Protein: 6 g/dL (ref 6.0–8.3)

## 2023-11-17 LAB — B12 AND FOLATE PANEL
Folate: >25.2 ng/mL (ref >5.9)
Vitamin B-12: 405 pg/mL (ref 211–911)

## 2023-11-17 MED ORDER — POTASSIUM CHLORIDE CRYS ER 20 MEQ PO TBCR: 20.0000 meq | EXTENDED_RELEASE_TABLET | Freq: Every day | ORAL | 1 refills | Status: DC

## 2023-11-17 MED ORDER — THIAMINE MONONITRATE 100 MG PO TABS: 100.0000 mg | ORAL_TABLET | Freq: Every day | ORAL | 1 refills | Status: DC

## 2023-11-17 MED ORDER — FOLIC ACID 1 MG PO TABS: 1.0000 mg | ORAL_TABLET | Freq: Every day | ORAL | 1 refills | Status: DC

## 2023-11-17 MED ORDER — FLUOXETINE HCL 40 MG PO CAPS: 40.0000 mg | ORAL_CAPSULE | Freq: Every day | ORAL | 1 refills | Status: DC

## 2023-11-17 NOTE — Progress Notes (Signed)
 EPIC Encounter for ICM Monitoring  Patient Name: Anthony Daugherty is a 78 y.o. male Date: 11/17/2023 Primary Care Physican: Shelvia Dick, MD Primary Cardiologist: Micael Adas Electrophysiologist: Morrell Aran Pacing: >99%         11/14/2021 Office Weight: 160 lbs 04/17/2023 Weight: 171 lbs 07/28/2023 Weight: 171 lbs 08/19/2023 Office Weight: 171 lbs 11/17/2023 Office Weight: 175 lbs   AT/AF Burden: <1%                                                           Pt sent message via mychart and stated remote transmission was sent today.  Transmission results reviewed and sent via my chart to patient.    Diet:  Does not limit salt intake and uses salt shaker.    CorVue thoracic impedance suggesting possible fluid accumulation starting 5/3 and returned close to baseline 5/5.   Prescribed: Potassium 20 mEq take 1 tablet by mouth daily Spironolactone  25 mg take 1 tablet by mouth daily    Labs: 11/17/2023 Creatinine 0.97, BUN 19, Potassium 3.9, Sodium 142, GFR 75.45 03/06/2023 Creatinine 0.97, BUN 12, Potassium 4.1, Sodium 143, GFR 81 10/09/2022 Creatinine 0.92, BUN 11, Potassium 4.1, Sodium 139, GFR 81.03 A complete set of results can be found in Results Review.   Recommendations:   Advised to limit salt and fluid intake via mychart message and to call for any symptoms or concerns.   Follow-up plan: ICM clinic phone appointment on 12/22/2023.   91 day device clinic remote transmission 01/05/2024.   EP/Cardiology Office Visits:  Recall 02/15/2024 with Dr Micael Adas.  Recall 10/05/2023 with Dr Arlester Ladd.     Copy of ICM check sent to Dr. Lawana Pray.    3 month ICM trend: 11/24/2023.    12-14 Month ICM trend:     Almyra Jain, RN 11/17/2023 4:58 PM

## 2023-11-17 NOTE — Progress Notes (Signed)
 Office Note 11/17/2023  CC:  Chief Complaint  Patient presents with   Medical Management of Chronic Issues    HPI:  Patient is a 78 y.o. male who is here accompanied by his wife Leola Raisin for annual health maintenance exam and follow-up anger control problems.  Memory problems/cognitive impairment stable.  He still has some mild irritability and/anger if he is pressed/questioned too much.  He denies depression or anxiety.  Most recent cardiology follow-up was 08/19/2023, encounter note reviewed today.  Plan is for routine follow-up echo September 2025.  No medication changes were made.   Past Medical History:  Diagnosis Date   Amnestic MCI (mild cognitive impairment with memory loss) 08/09/2021   Aortic insufficiency 10/21/2018   Mild-mod on echo 07/2017.  Moderate 08/2018.  Moderate-severe on echo 04/2019--> TEE 11/15/19 EF 50-55%, no LV dilation or WMA. Mod AV sclerosis w/out stenosis, mod-to-sev AI. 11/2020 moderate AI. severe eccentric by echo 12/2021.  Severe on echo 12/2021->cardiac MRI confirmed.  Moderate on echo 03/2023   Aortic root dilatation 10/2018   11/17/19 45 mm aortic root at the sinuses of Valsalva-> 11/2020 echo->40 mm, unchanged on echo 03/2023   Aortic stenosis, mild    mild to moderate by echo 03/2023 with mean AVG and DVI   Arthritis    L-Spine   Cardiac murmur    Systolic and diastolic as of 07/2017.   Cardiomyopathy 10/12/2010   Global hypokinesis, EF 40-45% by cath and echo (2012 echo and 2019 echo).  Feb 2020 EF 20-25%. - mixed ICM/NICM (alcohol and CAD)  October 2020 EF 25-30%. 11/2020 EF 40-45%. 06/2021 EF 45-50%,dil LV+hypokinesis,mod AI,grd I DD.   Chronic systolic (congestive) heart failure 06/01/2019   max med mgmt, plus CRT-D placed 05/2019, EF improved to 50% after. Cardiac MRI 03/2022 EF 40%   Coronary artery disease 11/16/2010   a. 2012 - 80% OM lesion. b. Cath 12/2018 - showing 95% prox RCA, 75% OM1, 50% prox-mid LAD. Unsuccessful PCI to 100% chronically  occluded RCA summer 2020. CT angio chest w/aorta 08/2021->severe coronary calc's, no aneurism. Mild subclav stenosis d/t athero   Diverticulosis    Erectile dysfunction 08/16/2011   Essential hypertension 07/19/2010   GAD (generalized anxiety disorder)    managed by Dr. Deborra Falter with prozac  20mg  qd   Gout    Usually Right great toe; colchicine  helps well   Gouty arthritis of toe of left foot 05/27/2013   Habitual alcohol use    History of colonoscopy    Initial was normal; 2014-polypectomy and diverticulosis.  Recall 2019.   Hyperlipidemia 10/23/2012   LBBB (left bundle branch block)    CRT-D placed 05/2019   Low back strain 10/24/2013   Multiple lacunar infarcts 11/15/2021   Right caudate nucleus (acute/subacute 11/2021); remote subcentimeter infarct within the inferior right cerebellar hemisphere   Obesity, Class I, BMI 30-34.9    Pulmonary nodule    0.5 cm RLL subpleural nodule.  Pt NEVER SMOKER.  Low risk->no f/u imaging is needed.   Subclavian artery stenosis, left    mild, detected on CT angio chest w/aorta 08/2021   Thyroid  nodule 04/23/2013    Past Surgical History:  Procedure Laterality Date   BIV ICD INSERTION CRT-D N/A 06/01/2019   Procedure: BIV ICD INSERTION CRT-D;  Surgeon: Jolly Needle, MD;  Location: [facility] INVASIVE CV LAB;  Service: Cardiovascular;  Laterality: N/A;   CARDIAC CATHETERIZATION  10/12/2010   diagnostic only: 1 vessel CAD with TIMI 3 flow, EF 40-45%.  Mild AS.  Medical mgmt of CAD.   COLONOSCOPY     COLONOSCOPY W/ POLYPECTOMY  10/06/2012   Diverticulosis and tubular adenoma w/out high grade dysplasia.  Repeat 5 yrs (Dr. Arvie Latus).   CORONARY ATHERECTOMY N/A 01/08/2019   100% chronic RCA occlusion impossible to clear.  Med mgmt. Procedure: CORONARY ATHERECTOMY;  Surgeon: Arnoldo Lapping, MD;  Location: [facility] INVASIVE CV LAB;  Service: Cardiovascular;  Laterality: N/A;   POLYPECTOMY     RIGHT HEART CATH AND CORONARY ANGIOGRAPHY N/A 12/24/2018   95% RCA occlusion,  75% obtuse marginal occlusion (3V dz). Procedure: RIGHT HEART CATH AND CORONARY ANGIOGRAPHY;  Surgeon: Avanell Leigh, MD;  Location: [facility] INVASIVE CV LAB;  Service: Cardiovascular;  Laterality: N/A;   TEE WITHOUT CARDIOVERSION N/A 11/15/2019   EF 50-55%, no LV dilation or WMA. Mod AV sclerosis w/out stenosis, mod-to-sev AI.  Procedure: TRANSESOPHAGEAL ECHOCARDIOGRAM (TEE);  Surgeon: Elmyra Haggard, MD;  Location: [facility] ENDOSCOPY;  Service: Cardiovascular;  Laterality: N/A;   TRANSTHORACIC ECHOCARDIOGRAM  09/2010; 07/2017; 08/2018; 10/2019;05/2020   2012:  EF 40-45%, global hypokinesis, mild AS.  07/2017: no chg except mild/mod AR.  08/2018 EF 20-25%, global LV hypok, DD, biatrial enlargemt, mod AR, aortic root 3.8 cm. 10/2019 EF 50%, mild dec RV fxn, mod-sev AR, aor root dil 42 mm. 05/2020 EF 45-50%,grd I DD, mod AI, aorta 38mm, severe LAE. 10/2020 EF 45-50%, mild LVH, global hypokin, grd I DD, mod AI. Echo 12/2021 EF 45-50%, severe AR   TRANSTHORACIC ECHOCARDIOGRAM     03/2023 EF 50-55%, mild MR, mod TR, mod AS and AR    Family History  Problem Relation Age of Onset   Congenital heart disease Brother        cardiomyopathy   Alzheimer's disease Mother    COPD Father    Heart failure Father        congestive   Colon cancer Neg Hx    Esophageal cancer Neg Hx    Stomach cancer Neg Hx    Rectal cancer Neg Hx    Colon polyps Neg Hx     Social History   Socioeconomic History   Marital status: Married    Spouse name: Leola Raisin   Number of children: 2   Years of education: 16   Highest education level: Bachelor's degree (e.g., BA, AB, BS)  Occupational History   Occupation: Retired    Associate Professor: [employer]  Tobacco Use   Smoking status: Former   Smokeless tobacco: Current    Types: Chew   Tobacco comments:    chews tobacco  Vaping Use   Vaping status: Never Used  Substance and Sexual Activity   Alcohol use: Yes    Comment: 9/22 - 28 per week; 1/23 - 12 per week, 06/28/22 no drinks during  past week   Drug use: No   Sexual activity: Yes  Other Topics Concern   Not on file  Social History Narrative   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  Married, two children.   Pt denies ever smoking cigarettes.   +Alcohol.   Lives in [place].   Right handed   Drinks caffeine   Two story home   Social Drivers of Health   Financial Resource Strain: Low Risk  (09/17/2023)   Overall Financial Resource Strain (CARDIA)    Difficulty of Paying Living Expenses: Not hard at all  Food Insecurity: No Food Insecurity (09/17/2023)   Hunger Vital Sign    Worried About Running Out of Food in the Last  Year: Never true    Ran Out of Food in the Last Year: Never true  Transportation Needs: No Transportation Needs (09/17/2023)   PRAPARE - Administrator, Civil Service (Medical): No    Lack of Transportation (Non-Medical): No  Physical Activity: Inactive (09/17/2023)   Exercise Vital Sign    Days of Exercise per Week: 0 days    Minutes of Exercise per Session: 0 min  Stress: No Stress Concern Present (09/17/2023)   [employer] of Occupational Health - Occupational Stress Questionnaire    Feeling of Stress : Not at all  Social Connections: Moderately Integrated (09/17/2023)   Social Connection and Isolation Panel [NHANES]    Frequency of Communication with Friends and Family: More than three times a week    Frequency of Social Gatherings with Friends and Family: Twice a week    Attends Religious Services: More than 4 times per year    Active Member of [employer] or Organizations: No    Attends Banker Meetings: Never    Marital Status: Married  Catering manager Violence: Not At Risk (09/17/2023)   Humiliation, Afraid, Rape, and Kick questionnaire    Fear of Current or Ex-Partner: No    Emotionally Abused: No    Physically Abused: No    Sexually Abused: No    Outpatient Medications Prior to Visit  Medication Sig Dispense Refill   aspirin  EC 81 MG tablet Take 81 mg by  mouth daily. Swallow whole.     atorvastatin  (LIPITOR) 80 MG tablet Take 1 tablet (80 mg total) by mouth daily. 90 tablet 3   cyanocobalamin  (VITAMIN B12) 1000 MCG tablet Take 1,000 mcg by mouth daily.     donepezil  (ARICEPT ) 10 MG tablet Take 1 tablet 10 mg daily 90 tablet 3   empagliflozin  (JARDIANCE ) 10 MG TABS tablet TAKE ONE TABLET DAILY BEFORE BREAKFAST 90 tablet 1   memantine  (NAMENDA ) 5 MG tablet Take 1 tablet (5 mg at night) for 2 weeks, then increase to 1 tablet (5 mg) twice a day 60 tablet 11   metoprolol  succinate (TOPROL  XL) 25 MG 24 hr tablet Take 1 tablet (25 mg total) by mouth daily. (Patient taking differently: Take 25 mg by mouth 3 (three) times daily.) 90 tablet 3   sacubitril -valsartan  (ENTRESTO ) 97-103 MG TAKE 1 TABLET 2 TIMES A DAY 180 tablet 3   spironolactone  (ALDACTONE ) 25 MG tablet Take 1 tablet (25 mg total) by mouth daily. 90 tablet 3   Vitamin D, Ergocalciferol, (DRISDOL) 1.25 MG (50000 UNIT) CAPS capsule Take 50,000 Units by mouth once a week.     FLUoxetine  (PROZAC ) 40 MG capsule Take 1 capsule (40 mg total) by mouth daily. OFFICE VISIT NEEDED FOR FURTHER REFILLS 30 capsule 0   folic acid  (FOLVITE ) 1 MG tablet Take 1 tablet (1 mg total) by mouth daily. MUST KEEP APPT FOR FURTHER REFILLS 30 tablet 0   potassium chloride  SA (KLOR-CON  M) 20 MEQ tablet Take 1 tablet (20 mEq total) by mouth daily. 90 tablet 1   thiamine  (VITAMIN B-1) 100 MG tablet Take 1 tablet (100 mg total) by mouth daily. 90 tablet 3   No facility-administered medications prior to visit.    Allergies  Allergen Reactions   Penicillins Swelling    Did it involve swelling of the face/tongue/throat, SOB, or low BP? No Did it involve sudden or severe rash/hives, skin peeling, or any reaction on the inside of your mouth or nose? No Did you need to seek  medical attention at a hospital or doctor's office? No When did it last happen? ~10 years ago If all above answers are "NO", may proceed with  cephalosporin use.     Review of Systems  Constitutional:  Negative for appetite change, chills, fatigue and fever.  HENT:  Negative for congestion, dental problem, ear pain and sore throat.   Eyes:  Negative for discharge, redness and visual disturbance.  Respiratory:  Negative for cough, chest tightness, shortness of breath and wheezing.   Cardiovascular:  Negative for chest pain, palpitations and leg swelling.  Gastrointestinal:  Negative for abdominal pain, blood in stool, diarrhea, nausea and vomiting.  Genitourinary:  Negative for difficulty urinating, dysuria, flank pain, frequency, hematuria and urgency.  Musculoskeletal:  Negative for arthralgias, back pain, joint swelling, myalgias and neck stiffness.  Skin:  Negative for pallor and rash.  Neurological:  Negative for dizziness, speech difficulty, weakness and headaches.  Hematological:  Negative for adenopathy. Does not bruise/bleed easily.  Psychiatric/Behavioral:  Negative for confusion and sleep disturbance. The patient is not nervous/anxious.     PE;    11/17/2023    1:03 PM 08/19/2023    2:03 PM 06/23/2023   12:57 PM  Vitals with BMI  Height 5\' 7"  5\' 7"  5\' 6"   Weight 175 lbs 171 lbs 13 oz   BMI 27.4 26.9   Systolic 92 100 115  Diastolic 53 62 81  Pulse 64 65 80   Gen: Alert, well appearing.  Patient is oriented to person, place, time, and situation. AFFECT: pleasant, lucid thought and speech. ENT: Ears: EACs clear, normal epithelium.  TMs with good light reflex and landmarks bilaterally.  Eyes: no injection, icteris, swelling, or exudate.  EOMI, PERRLA. Nose: no drainage or turbinate edema/swelling.  No injection or focal lesion.  Mouth: lips without lesion/swelling.  Oral mucosa pink and moist.  Dentition intact and without obvious caries or gingival swelling.  Oropharynx without erythema, exudate, or swelling.  Neck: supple/nontender.  No LAD, mass, or TM.  Carotid pulses 2+ bilaterally, without bruits. CV: RRR, no  m/r/g.   LUNGS: CTA bilat, nonlabored resps, good aeration in all lung fields. ABD: soft, NT, ND, BS normal.  No hepatospenomegaly or mass.  No bruits. EXT: no clubbing, cyanosis, or edema.  Musculoskeletal: no joint swelling, erythema, warmth, or tenderness.  ROM of all joints intact. Skin - no sores or suspicious lesions or rashes or color changes  Pertinent labs:  Lab Results  Component Value Date   TSH 3.45 04/12/2021   Lab Results  Component Value Date   WBC 5.0 10/09/2022   HGB 14.9 10/09/2022   HCT 43.5 10/09/2022   MCV 92.1 10/09/2022   PLT 205.0 10/09/2022   Lab Results  Component Value Date   CREATININE 0.97 03/06/2023   BUN 12 03/06/2023   NA 143 03/06/2023   K 4.1 03/06/2023   CL 108 (H) 03/06/2023   CO2 23 03/06/2023   Lab Results  Component Value Date   ALT 20 10/06/2023   AST 17 10/09/2022   ALKPHOS 77 10/09/2022   BILITOT 1.1 10/09/2022   Lab Results  Component Value Date   CHOL 135 10/06/2023   Lab Results  Component Value Date   HDL 46 10/06/2023   Lab Results  Component Value Date   LDLCALC 71 10/06/2023   Lab Results  Component Value Date   TRIG 98 10/06/2023   Lab Results  Component Value Date   CHOLHDL 2.9 10/06/2023   Lab Results  Component Value Date   PSA 2.54 08/01/2017   PSA 1.42 12/08/2015   PSA 1.59 07/22/2014    ASSESSMENT AND PLAN:   #1 health maintenance exam: Reviewed age and gender appropriate health maintenance issues (prudent diet, regular exercise, health risks of tobacco and excessive alcohol, use of seatbelts, fire alarms in home, use of sunscreen).  Also reviewed age and gender appropriate health screening as well as vaccine recommendations. Vaccines: ALL UTD. Labs: cbc,cmet, b12, folate Prostate ca screening: shared decision making process utilized today->no further screening indicated due to age. Colon ca screening: adenoma 2014, is overdue for rpt colonoscopy-->he declines.  #2 difficulty controlling  anger.  This is part of the personality change associated with his dementia Stable. Continue fluoxetine  40 mg a day.  #3 chronic combined systolic and diastolic heart failure. He does normal day-to-day physical activities (nothing strenuous or prolonged) and has no symptoms.  Followed by cardiology. Continue Entresto  97-103 1 tablet twice daily, spironolactone  25 mg/day, Toprol -XL 75 mg a day, and Jardiance  10 mg a day. Monitor electrolytes and creatinine today.  #4 CAD. Asymptomatic. Continue atorvastatin  80 mg a day, aspirin  81 mg a day, and Toprol  XL 75 mg a day.  #5 history of habitual alcohol use. Continue thiamine  and folic acid  supplement. Monitor levels today.  #6 LBBB: Has ICD/CRT-D. Encourged patient and his wife to contact his electrophysiologist office to see if there is any problem with his device since there was a note about not getting the remote transmission from last month.  An After Visit Summary was printed and given to the patient.  FOLLOW UP:  Return in about 6 months (around 05/19/2024).  Signed:  Arletha Lady, MD           11/17/2023

## 2023-11-18 ENCOUNTER — Encounter: Payer: Self-pay | Admitting: Family Medicine

## 2023-11-24 ENCOUNTER — Encounter

## 2023-11-25 NOTE — Progress Notes (Signed)
 Remote ICD transmission.
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
## 2023-12-22 ENCOUNTER — Telehealth: Payer: Self-pay

## 2023-12-22 ENCOUNTER — Ambulatory Visit: Attending: Cardiology

## 2023-12-22 DIAGNOSIS — I5042 Chronic combined systolic (congestive) and diastolic (congestive) heart failure: Secondary | ICD-10-CM

## 2023-12-22 DIAGNOSIS — Z95810 Presence of automatic (implantable) cardiac defibrillator: Secondary | ICD-10-CM | POA: Diagnosis not present

## 2023-12-22 NOTE — Telephone Encounter (Signed)
 Attempted patient call and left message requesting remote transmission to check monthly fluid levels.  Advised to leave mobile phone with app open and by bedside that the clinic can receive automatic transmissions.

## 2023-12-23 ENCOUNTER — Ambulatory Visit (INDEPENDENT_AMBULATORY_CARE_PROVIDER_SITE_OTHER): Payer: Medicare PPO | Admitting: Physician Assistant

## 2023-12-23 ENCOUNTER — Encounter: Payer: Self-pay | Admitting: Physician Assistant

## 2023-12-23 VITALS — BP 106/65 | HR 65 | Ht 66.5 in | Wt 172.0 lb

## 2023-12-23 DIAGNOSIS — G3184 Mild cognitive impairment, so stated: Secondary | ICD-10-CM | POA: Diagnosis not present

## 2023-12-23 MED ORDER — DONEPEZIL HCL 10 MG PO TABS: ORAL_TABLET | ORAL | 3 refills | Status: AC

## 2023-12-23 MED ORDER — MEMANTINE HCL 5 MG PO TABS: ORAL_TABLET | ORAL | 3 refills | Status: DC

## 2023-12-23 NOTE — Patient Instructions (Signed)
 It was a pleasure to see you today at our office.   Recommendations:  Follow up in 6  months Continue taking donepezil  10 mg daily  Continue Memantine  5mg  tablets  twice daily. Repeat neuropsych evaluation  Continue taking ASA baby and statin  Continue control of mood with primary doctor   Whom to call:  Memory  decline, memory medications: Call our office [phone]   For psychiatric meds, mood meds: Please have your primary care physician manage these medications.   Counseling regarding caregiver distress, including caregiver depression, anxiety and issues regarding community resources, adult day care programs, adult living facilities, or memory care questions:   Feel free to contact Misty Court Distance, Social Worker at [phone]   For assessment of decision of mental capacity and competency:  Call Dr. Laverne Potter, geriatric psychiatrist at [phone]  For guidance in geriatric dementia issues please call [facility] [phone]  For guidance regarding [facility] and if placement were needed at the facility, contact Forrestine Ike, Social Worker tel: [phone]  If you have any severe symptoms of a stroke, or other severe issues such as confusion,severe chills or fever, etc call 911 or go to the ER as you may need to be evaluated further   Feel free to visit Facebook page " [email or web address]" for tips of how to care for people with memory problems.         RECOMMENDATIONS FOR ALL PATIENTS WITH MEMORY PROBLEMS: 1. Continue to exercise (Recommend 30 minutes of walking everyday, or 3 hours every week) 2. Increase social interactions - continue going to [place] and enjoy social gatherings with friends and family 3. Eat healthy, avoid fried foods and eat more fruits and vegetables 4. Maintain adequate blood pressure, blood sugar, and blood cholesterol level. Reducing the risk of stroke and cardiovascular disease also helps promoting better  memory. 5. Avoid stressful situations. Live a simple life and avoid aggravations. Organize your time and prepare for the next day in anticipation. 6. Sleep well, avoid any interruptions of sleep and avoid any distractions in the bedroom that may interfere with adequate sleep quality 7. Avoid sugar, avoid sweets as there is a strong link between excessive sugar intake, diabetes, and cognitive impairment We discussed the Mediterranean diet, which has been shown to help patients reduce the risk of progressive memory disorders and reduces cardiovascular risk. This includes eating fish, eat fruits and green leafy vegetables, nuts like almonds and hazelnuts, walnuts, and also use olive oil. Avoid fast foods and fried foods as much as possible. Avoid sweets and sugar as sugar use has been linked to worsening of memory function.  There is always a concern of gradual progression of memory problems. If this is the case, then we may need to adjust level of care according to patient needs. Support, both to the patient and caregiver, should then be put into place.    FALL PRECAUTIONS: Be cautious when walking. Scan the area for obstacles that may increase the risk of trips and falls. When getting up in the mornings, sit up at the edge of the bed for a few minutes before getting out of bed. Consider elevating the bed at the head end to avoid drop of blood pressure when getting up. Walk always in a well-lit room (use night lights in the walls). Avoid area rugs or power cords from appliances in the middle of the walkways. Use a walker or a cane if necessary and consider physical therapy for balance exercise.  Get your eyesight checked regularly.  FINANCIAL OVERSIGHT: Supervision, especially oversight when making financial decisions or transactions is also recommended.  HOME SAFETY: Consider the safety of the kitchen when operating appliances like stoves, microwave oven, and blender. Consider having supervision and share  cooking responsibilities until no longer able to participate in those. Accidents with firearms and other hazards in the house should be identified and addressed as well.   ABILITY TO BE LEFT ALONE: If patient is unable to contact 911 operator, consider using LifeLine, or when the need is there, arrange for someone to stay with patients. Smoking is a fire hazard, consider supervision or cessation. Risk of wandering should be assessed by caregiver and if detected at any point, supervision and safe proof recommendations should be instituted.  MEDICATION SUPERVISION: Inability to self-administer medication needs to be constantly addressed. Implement a mechanism to ensure safe administration of the medications.   DRIVING: Regarding driving, in patients with progressive memory problems, driving will be impaired. We advise to have someone else do the driving if trouble finding directions or if minor accidents are reported. Independent driving assessment is available to determine safety of driving.   If you are interested in the driving assessment, you can contact the following:  [employer] in [place] [phone]  [facility] [phone]  [facility] [phone]  [facility] [phone] or [phone]

## 2023-12-24 ENCOUNTER — Telehealth: Payer: Self-pay

## 2023-12-24 NOTE — Progress Notes (Signed)
 EPIC Encounter for ICM Monitoring  Patient Name: Anthony Daugherty is a 78 y.o. male Date: 12/24/2023 Primary Care Physican: Shelvia Dick, MD Primary Cardiologist: Micael Adas Electrophysiologist: Morrell Aran Pacing: >99%         11/14/2021 Office Weight: 160 lbs 04/17/2023 Weight: 171 lbs 07/28/2023 Weight: 171 lbs 08/19/2023 Office Weight: 171 lbs 11/17/2023 Office Weight: 175 lbs   AT/AF Burden: <1%                                                           Attempted call to patient and unable to reach.  Left detailed message per DPR regarding transmission.  Transmission results reviewed.    Diet:  Does not limit salt intake and uses salt shaker.    CorVue thoracic impedance suggesting normal fluid levels with the exception of possible fluid accumulation from 5/3-5/5.   Prescribed: Potassium 20 mEq take 1 tablet by mouth daily Spironolactone  25 mg take 1 tablet by mouth daily    Labs: 11/17/2023 Creatinine 0.97, BUN 19, Potassium 3.9, Sodium 142, GFR 75.45 03/06/2023 Creatinine 0.97, BUN 12, Potassium 4.1, Sodium 143, GFR 81 10/09/2022 Creatinine 0.92, BUN 11, Potassium 4.1, Sodium 139, GFR 81.03 A complete set of results can be found in Results Review.   Recommendations:  Left voice mail with ICM number and encouraged to call if experiencing any fluid symptoms.    Follow-up plan: ICM clinic phone appointment on 02/09/2024.   91 day device clinic remote transmission 01/05/2024.   EP/Cardiology Office Visits:  Recall 02/15/2024 with Dr Micael Adas.  Recall 10/05/2023 with Dr Arlester Ladd.     Copy of ICM check sent to Dr. Lawana Pray.    3 month ICM trend: 12/22/2023.    12-14 Month ICM trend:     Almyra Jain, RN 12/24/2023 9:53 AM

## 2023-12-24 NOTE — Telephone Encounter (Signed)
Remote ICM transmission received.  Attempted call to patient regarding ICM remote transmission and left detailed message per DPR.  Left ICM phone number and advised to return call for any fluid symptoms or questions.

## 2023-12-30 ENCOUNTER — Ambulatory Visit: Attending: Cardiovascular Disease | Admitting: Cardiovascular Disease

## 2023-12-30 ENCOUNTER — Encounter: Payer: Self-pay | Admitting: Cardiovascular Disease

## 2023-12-30 ENCOUNTER — Ambulatory Visit: Payer: Self-pay | Admitting: Cardiovascular Disease

## 2023-12-30 VITALS — BP 110/70 | HR 65 | Ht 66.5 in | Wt 183.0 lb

## 2023-12-30 DIAGNOSIS — I447 Left bundle-branch block, unspecified: Secondary | ICD-10-CM | POA: Diagnosis not present

## 2023-12-30 DIAGNOSIS — I255 Ischemic cardiomyopathy: Secondary | ICD-10-CM

## 2023-12-30 DIAGNOSIS — I5042 Chronic combined systolic (congestive) and diastolic (congestive) heart failure: Secondary | ICD-10-CM

## 2023-12-30 LAB — CUP PACEART INCLINIC DEVICE CHECK
Date Time Interrogation Session: 20250617165615
Implantable Lead Connection Status: 753985
Implantable Lead Connection Status: 753985
Implantable Lead Connection Status: 753985
Implantable Lead Implant Date: 20201117
Implantable Lead Implant Date: 20201117
Implantable Lead Implant Date: 20201117
Implantable Lead Location: 753858
Implantable Lead Location: 753859
Implantable Lead Location: 753860
Implantable Pulse Generator Implant Date: 20201117
Pulse Gen Serial Number: 111012743

## 2023-12-30 NOTE — Patient Instructions (Signed)

## 2023-12-30 NOTE — Progress Notes (Signed)
   PCP: Shelvia Dick, MD Primary Cardiologist: Drs Geronimo Krabbe Primary EP: Dr Gala Jubilee is a 78 y.o. male who presents today for routine electrophysiology followup.    He has history of CHF with reduced EF, CAD, left bundle branch block and an Abbott BiV ICD placed November 2020. EF recovered partially after CRT placement.  Since last being seen in our clinic, the patient reports doing very well.  Today, he denies symptoms of palpitations, chest pain, shortness of breath,  lower extremity edema, dizziness, presyncope, syncope, or ICD shocks.  The patient is otherwise without complaint today.      Physical Exam: Vitals:   12/30/23 1504  BP: 110/70  Pulse: 65  SpO2: 95%  Weight: 183 lb (83 kg)  Height: 5' 6.5 (1.689 m)     Gen: Appears comfortable, well-nourished CV: RRR, no dependent edema The device site is normal -- no tenderness, edema, drainage, redness, threatened erosion. Pulm: breathing easily  TTE September 2024: LVEF 50 to 55%.  Left atrium mildly dilated.  ICD interrogation- reviewed in detail today,  See PACEART report  EKG Interpretation Date/Time:  Tuesday December 30 2023 15:09:36 EDT Ventricular Rate:  65 PR Interval:  200 QRS Duration:  140 QT Interval:  462 QTC Calculation: 480 R Axis:   255  Text Interpretation: AV dual-paced rhythm Biventricular pacemaker detected When compared with ECG of 19-Aug-2023 14:06, No significant change was found Confirmed by Marlane Silver [phone]) on 12/30/2023 3:30:08 PM    Wt Readings from Last 3 Encounters:  12/30/23 183 lb (83 kg)  12/23/23 172 lb (78 kg)  11/17/23 175 lb (79.4 kg)    Assessment and Plan:  1.  Chronic systolic dysfunction/ ischemic CM/ CAD euvolemic today No ischemic symptoms Stable on an appropriate medical regimen Normal ICD function See Valeta Gaudier Art report No changes today he is not device dependant today followed in ICM device clinic EF 40% by MRI 02/2022, improved  to 50-55% on TTE 03/2024  2. HTN Stable No change required today  Return to see EP APP in a year  Efraim Grange, MD 12/30/2023 3:30 PM

## 2024-01-05 ENCOUNTER — Ambulatory Visit (INDEPENDENT_AMBULATORY_CARE_PROVIDER_SITE_OTHER): Payer: Medicare PPO

## 2024-01-05 DIAGNOSIS — I5042 Chronic combined systolic (congestive) and diastolic (congestive) heart failure: Secondary | ICD-10-CM

## 2024-01-05 DIAGNOSIS — I255 Ischemic cardiomyopathy: Secondary | ICD-10-CM

## 2024-01-06 LAB — CUP PACEART REMOTE DEVICE CHECK
Battery Remaining Longevity: 30 mo
Battery Remaining Percentage: 42 %
Battery Voltage: 2.92 V
Brady Statistic AP VP Percent: 91 %
Brady Statistic AP VS Percent: 1 %
Brady Statistic AS VP Percent: 8.4 %
Brady Statistic AS VS Percent: 1 %
Brady Statistic RA Percent Paced: 90 %
Date Time Interrogation Session: 20250623020034
HighPow Impedance: 59 Ohm
Implantable Lead Connection Status: 753985
Implantable Lead Connection Status: 753985
Implantable Lead Connection Status: 753985
Implantable Lead Implant Date: 20201117
Implantable Lead Implant Date: 20201117
Implantable Lead Implant Date: 20201117
Implantable Lead Location: 753858
Implantable Lead Location: 753859
Implantable Lead Location: 753860
Implantable Pulse Generator Implant Date: 20201117
Lead Channel Impedance Value: 390 Ohm
Lead Channel Impedance Value: 410 Ohm
Lead Channel Impedance Value: 740 Ohm
Lead Channel Pacing Threshold Amplitude: 0.75 V
Lead Channel Pacing Threshold Amplitude: 1 V
Lead Channel Pacing Threshold Amplitude: 2 V
Lead Channel Pacing Threshold Pulse Width: 0.5 ms
Lead Channel Pacing Threshold Pulse Width: 0.5 ms
Lead Channel Pacing Threshold Pulse Width: 0.5 ms
Lead Channel Sensing Intrinsic Amplitude: 1 mV
Lead Channel Sensing Intrinsic Amplitude: 11.8 mV
Lead Channel Setting Pacing Amplitude: 1.5 V
Lead Channel Setting Pacing Amplitude: 1.75 V
Lead Channel Setting Pacing Amplitude: 3.5 V
Lead Channel Setting Pacing Pulse Width: 0.5 ms
Lead Channel Setting Pacing Pulse Width: 0.5 ms
Lead Channel Setting Sensing Sensitivity: 0.5 mV
Pulse Gen Serial Number: 111012743
Zone Setting Status: 755011

## 2024-01-14 ENCOUNTER — Ambulatory Visit: Payer: Self-pay | Admitting: Cardiovascular Disease

## 2024-02-09 ENCOUNTER — Ambulatory Visit: Attending: Cardiology

## 2024-02-09 DIAGNOSIS — Z95810 Presence of automatic (implantable) cardiac defibrillator: Secondary | ICD-10-CM

## 2024-02-09 DIAGNOSIS — I5042 Chronic combined systolic (congestive) and diastolic (congestive) heart failure: Secondary | ICD-10-CM

## 2024-02-10 ENCOUNTER — Telehealth: Payer: Self-pay

## 2024-02-10 NOTE — Telephone Encounter (Signed)
 Remote ICM transmission received.  Attempted call to patient regarding ICM remote transmission and left detailed message per DPR.  Left ICM phone number and advised to return call for any fluid symptoms or questions.  Next ICM remote transmission scheduled 02/16/2024.

## 2024-02-10 NOTE — Progress Notes (Signed)
 EPIC Encounter for ICM Monitoring  Patient Name: Anthony Daugherty is a 78 y.o. male Date: 02/10/2024 Primary Care Physican: Candise Aleene DEL, MD Primary Cardiologist: Shlomo Electrophysiologist: [name] Pacing: 99%         11/14/2021 Office Weight: 160 lbs 04/17/2023 Weight: 171 lbs 07/28/2023 Weight: 171 lbs 08/19/2023 Office Weight: 171 lbs 11/17/2023 Office Weight: 175 lbs   AT/AF Burden: <1%                                                           Attempted call to patient and unable to reach.  Left detailed message per DPR regarding transmission.  Transmission results reviewed.    Diet:  Does not limit salt intake and uses salt shaker.    CorVue thoracic impedance suggesting possible fluid accumulation starting 7/27.  Also suggesting possible fluid accumulation from 6/29-7/5 and 7/22-7/25.   Prescribed: Potassium 20 mEq take 1 tablet by mouth daily Spironolactone  25 mg take 1 tablet by mouth daily    Labs: 11/17/2023 Creatinine 0.97, BUN 19, Potassium 3.9, Sodium 142, GFR 75.45 03/06/2023 Creatinine 0.97, BUN 12, Potassium 4.1, Sodium 143, GFR 81 10/09/2022 Creatinine 0.92, BUN 11, Potassium 4.1, Sodium 139, GFR 81.03 A complete set of results can be found in Results Review.   Recommendations:  Left voice mail with ICM number and encouraged to call if experiencing any fluid symptoms.    Follow-up plan: ICM clinic phone appointment on 02/16/2024 to recheck fluid levels   91 day device clinic remote transmission 04/05/2024.   EP/Cardiology Office Visits:  Recall 02/15/2024 with Dr Shlomo.  Recall 12/24/2024 with Dr Nancey.     Copy of ICM check sent to Dr. Nancey.    3 month ICM trend: 02/09/2024.    12-14 Month ICM trend:     Anthony GORMAN Garner, RN 02/10/2024 3:13 PM

## 2024-02-11 NOTE — Progress Notes (Signed)
 Remote ICD transmission.

## 2024-02-11 NOTE — Addendum Note (Signed)
 Addended by: TAWNI DRILLING D on: 02/11/2024 01:10 PM   Modules accepted: Orders

## 2024-02-16 ENCOUNTER — Ambulatory Visit: Attending: Cardiovascular Disease

## 2024-02-16 DIAGNOSIS — Z95810 Presence of automatic (implantable) cardiac defibrillator: Secondary | ICD-10-CM

## 2024-02-16 DIAGNOSIS — I5042 Chronic combined systolic (congestive) and diastolic (congestive) heart failure: Secondary | ICD-10-CM

## 2024-02-16 NOTE — Progress Notes (Signed)
 EPIC Encounter for ICM Monitoring  Patient Name: Anthony Daugherty is a 78 y.o. male Date: 02/16/2024 Primary Care Physican: Anthony Aleene DEL, MD Primary Cardiologist: Anthony Daugherty Electrophysiologist: Anthony Daugherty Bi-V Pacing: 99%         11/14/2021 Office Weight: 160 lbs 04/17/2023 Weight: 171 lbs 07/28/2023 Weight: 171 lbs 08/19/2023 Office Weight: 171 lbs 11/17/2023 Office Weight: 175 lbs   AT/AF Burden: <1%       NSVT 2                                                     Transmission results reviewed.    Diet:  Does not limit salt intake and uses salt shaker.    CorVue thoracic impedance suggesting fluid levels returned to normal.   Prescribed: Potassium 20 mEq take 1 tablet by mouth daily Spironolactone  25 mg take 1 tablet by mouth daily    Labs: 11/17/2023 Creatinine 0.97, BUN 19, Potassium 3.9, Sodium 142, GFR 75.45 03/06/2023 Creatinine 0.97, BUN 12, Potassium 4.1, Sodium 143, GFR 81 10/09/2022 Creatinine 0.92, BUN 11, Potassium 4.1, Sodium 139, GFR 81.03 A complete set of results can be found in Results Review.   Recommendations:  No changes.   Follow-up plan: ICM clinic phone appointment on 03/11/2024.   91 day device clinic remote transmission 04/05/2024.   EP/Cardiology Office Visits:  Recall 02/15/2024 with Dr Anthony Daugherty.  Recall 12/24/2024 with Dr Anthony Daugherty.     Copy of ICM check sent to Dr. Nancey.    3 month ICM trend: 02/16/2024.    12-14 Month ICM trend:     Anthony GORMAN Garner, RN 02/16/2024 7:53 AM

## 2024-03-11 ENCOUNTER — Ambulatory Visit: Attending: Cardiovascular Disease

## 2024-03-11 ENCOUNTER — Telehealth: Payer: Self-pay

## 2024-03-11 DIAGNOSIS — Z95810 Presence of automatic (implantable) cardiac defibrillator: Secondary | ICD-10-CM

## 2024-03-11 DIAGNOSIS — I5042 Chronic combined systolic (congestive) and diastolic (congestive) heart failure: Secondary | ICD-10-CM

## 2024-03-11 NOTE — Telephone Encounter (Signed)
 Remote ICM transmission received.  Attempted call to patient regarding ICM remote transmission and no answer.

## 2024-03-11 NOTE — Progress Notes (Signed)
 EPIC Encounter for ICM Monitoring  Patient Name: Anthony Daugherty is a 78 y.o. male Date: 03/11/2024 Primary Care Physican: Candise Aleene DEL, MD Primary Cardiologist: Shlomo Electrophysiologist: [name] Pacing: 99%         11/14/2021 Office Weight: 160 lbs 04/17/2023 Weight: 171 lbs 07/28/2023 Weight: 171 lbs 08/19/2023 Office Weight: 171 lbs 11/17/2023 Office Weight: 175 lbs   AT/AF Burden: <1%       NSVT 2                                                     Attempted call to patient and unable to reach.  Transmission results reviewed.    Diet:  Does not limit salt intake and uses salt shaker.    CorVue thoracic impedance suggesting fluid levels returned to normal.   Prescribed: Potassium 20 mEq take 1 tablet by mouth daily Spironolactone  25 mg take 1 tablet by mouth daily    Labs: 11/17/2023 Creatinine 0.97, BUN 19, Potassium 3.9, Sodium 142, GFR 75.45 03/06/2023 Creatinine 0.97, BUN 12, Potassium 4.1, Sodium 143, GFR 81 10/09/2022 Creatinine 0.92, BUN 11, Potassium 4.1, Sodium 139, GFR 81.03 A complete set of results can be found in Results Review.   Recommendations:  Unable to reach.     Follow-up plan: ICM clinic phone appointment on 04/12/2024.   91 day device clinic remote transmission 04/05/2024.   EP/Cardiology Office Visits:  Recall 02/15/2024 with Dr Shlomo.  Recall 12/24/2024 with Dr Nancey.     Copy of ICM check sent to Dr. Nancey.    3 month ICM trend: 03/11/2024.    12-14 Month ICM trend:     Anthony GORMAN Garner, RN 03/11/2024 4:36 PM

## 2024-03-16 ENCOUNTER — Ambulatory Visit (HOSPITAL_COMMUNITY)
Admission: RE | Admit: 2024-03-16 | Discharge: 2024-03-16 | Disposition: A | Discharge status: Home / Self Care | Payer: Medicare PPO | Source: Ambulatory Visit | Attending: Cardiology | Admitting: Cardiology

## 2024-03-16 DIAGNOSIS — I77810 Thoracic aortic ectasia: Secondary | ICD-10-CM | POA: Insufficient documentation

## 2024-03-16 DIAGNOSIS — I359 Nonrheumatic aortic valve disorder, unspecified: Secondary | ICD-10-CM | POA: Insufficient documentation

## 2024-03-16 DIAGNOSIS — I5042 Chronic combined systolic (congestive) and diastolic (congestive) heart failure: Secondary | ICD-10-CM | POA: Diagnosis not present

## 2024-03-16 LAB — ECHOCARDIOGRAM COMPLETE
AR max vel: 1.19 cm2
AV Area VTI: 1.24 cm2
AV Area mean vel: 1.1 cm2
AV Mean grad: 7 mmHg
AV Peak grad: 13.2 mmHg
Ao pk vel: 1.82 m/s
Est EF: 45
S' Lateral: 3.7 cm

## 2024-03-17 ENCOUNTER — Ambulatory Visit: Payer: Self-pay | Admitting: Cardiology

## 2024-03-17 ENCOUNTER — Encounter: Payer: Self-pay | Admitting: Cardiology

## 2024-03-17 DIAGNOSIS — I351 Nonrheumatic aortic (valve) insufficiency: Secondary | ICD-10-CM

## 2024-03-17 DIAGNOSIS — I77810 Thoracic aortic ectasia: Secondary | ICD-10-CM

## 2024-03-22 NOTE — Telephone Encounter (Signed)
 Call to patient to discuss echo results. Spoke with spouse Romero [facility]), who verbalized understanding Echo showed mild LV dysfunction EF 45% and increased difference of the heart muscle called diastolic dysfunction. There is mild RV systolic dysfunction and mild right ventricular enlargement. Left atrium is mildly enlarged. There is moderate calcification of the aortic valve with moderate aortic leakiness. Mildly dilated aortic root at 41 mm. He was found to have reduced function of the basal inferior, inferior lateral and inferior septal walls likely related to known 95% RCA by cath in 2020. Discussed no significant change in EF compared to 2023 . Patient/spouse agreed to repeat echo in 1 year for dilated aorta and AI.

## 2024-03-22 NOTE — Telephone Encounter (Signed)
-----   Message from Wilbert Bihari sent at 03/17/2024  7:47 PM EDT ----- Echo showed mild LV dysfunction EF 45% and increased difference of the heart muscle called diastolic dysfunction.  There is mild RV systolic dysfunction and mild right ventricular enlargement.  Left  atrium is mildly enlarged.  There is moderate calcification of the aortic valve with moderate aortic leakiness.  Mildly dilated aortic root at 41 mm.  He was found to have reduced function of the  basal inferior, inferior lateral and inferior septal walls likely related to known 95% RCA by cath in 2020.  No significant change in EF compared to 2023 . please repeat echo in 1 year for dilated  aorta and AI. ----- Message ----- From: Interface, [employer] One [employer] Sent: 03/16/2024   2:46 PM EDT To: Wilbert JONELLE Bihari, MD

## 2024-03-27 ENCOUNTER — Other Ambulatory Visit: Payer: Self-pay | Admitting: Cardiology

## 2024-04-05 ENCOUNTER — Ambulatory Visit (INDEPENDENT_AMBULATORY_CARE_PROVIDER_SITE_OTHER): Payer: Medicare PPO

## 2024-04-05 DIAGNOSIS — I255 Ischemic cardiomyopathy: Secondary | ICD-10-CM | POA: Diagnosis not present

## 2024-04-05 LAB — CUP PACEART REMOTE DEVICE CHECK
Battery Remaining Longevity: 29 mo
Battery Remaining Percentage: 40 %
Battery Voltage: 2.9 V
Brady Statistic AP VP Percent: 91 %
Brady Statistic AP VS Percent: 1 %
Brady Statistic AS VP Percent: 8.1 %
Brady Statistic AS VS Percent: 1 %
Brady Statistic RA Percent Paced: 90 %
Date Time Interrogation Session: 20250922020024
HighPow Impedance: 57 Ohm
Implantable Lead Connection Status: 753985
Implantable Lead Connection Status: 753985
Implantable Lead Connection Status: 753985
Implantable Lead Implant Date: 20201117
Implantable Lead Implant Date: 20201117
Implantable Lead Implant Date: 20201117
Implantable Lead Location: 753858
Implantable Lead Location: 753859
Implantable Lead Location: 753860
Implantable Pulse Generator Implant Date: 20201117
Lead Channel Impedance Value: 390 Ohm
Lead Channel Impedance Value: 430 Ohm
Lead Channel Impedance Value: 740 Ohm
Lead Channel Pacing Threshold Amplitude: 0.5 V
Lead Channel Pacing Threshold Amplitude: 0.875 V
Lead Channel Pacing Threshold Amplitude: 2.25 V
Lead Channel Pacing Threshold Pulse Width: 0.5 ms
Lead Channel Pacing Threshold Pulse Width: 0.5 ms
Lead Channel Pacing Threshold Pulse Width: 0.5 ms
Lead Channel Sensing Intrinsic Amplitude: 0.7 mV
Lead Channel Sensing Intrinsic Amplitude: 0.8 mV
Lead Channel Setting Pacing Amplitude: 1.375
Lead Channel Setting Pacing Amplitude: 1.5 V
Lead Channel Setting Pacing Amplitude: 3.75 V
Lead Channel Setting Pacing Pulse Width: 0.5 ms
Lead Channel Setting Pacing Pulse Width: 0.5 ms
Lead Channel Setting Sensing Sensitivity: 0.5 mV
Pulse Gen Serial Number: 111012743
Zone Setting Status: 755011

## 2024-04-06 NOTE — Progress Notes (Signed)
Remote ICD Transmission.

## 2024-04-08 ENCOUNTER — Other Ambulatory Visit: Payer: Self-pay | Admitting: Cardiology

## 2024-04-12 ENCOUNTER — Ambulatory Visit: Attending: Cardiovascular Disease

## 2024-04-12 DIAGNOSIS — Z95810 Presence of automatic (implantable) cardiac defibrillator: Secondary | ICD-10-CM | POA: Diagnosis not present

## 2024-04-12 DIAGNOSIS — I5042 Chronic combined systolic (congestive) and diastolic (congestive) heart failure: Secondary | ICD-10-CM | POA: Diagnosis not present

## 2024-04-14 NOTE — Progress Notes (Signed)
 EPIC Encounter for ICM Monitoring  Patient Name: Anthony Daugherty is a 78 y.o. male Date: 04/14/2024 Primary Care Physican: Candise Aleene DEL, MD Primary Cardiologist: Shlomo Electrophysiologist: [name] Pacing: 99%         11/14/2021 Office Weight: 160 lbs 04/17/2023 Weight: 171 lbs 07/28/2023 Weight: 171 lbs 08/19/2023 Office Weight: 171 lbs 11/17/2023 Office Weight: 175 lbs   AT/AF Burden: <1%                                                       Transmission results reviewed.    Diet:  Does not limit salt intake and uses salt shaker.    Since 03/11/2024 ICM Remote Transmission: CorVue thoracic impedance suggesting intermittent days with possible fluid accumulation.   Prescribed: Potassium 20 mEq take 1 tablet by mouth daily Spironolactone  25 mg take 1 tablet by mouth daily    Labs: 11/17/2023 Creatinine 0.97, BUN 19, Potassium 3.9, Sodium 142, GFR 75.45 03/06/2023 Creatinine 0.97, BUN 12, Potassium 4.1, Sodium 143, GFR 81 10/09/2022 Creatinine 0.92, BUN 11, Potassium 4.1, Sodium 139, GFR 81.03 A complete set of results can be found in Results Review.   Recommendations:  No changes.    Follow-up plan: ICM clinic phone appointment on 05/24/2024.   91 day device clinic remote transmission 07/05/2024.   EP/Cardiology Office Visits:  Recall 02/15/2024 with Dr Shlomo.  Recall 12/24/2024 with Dr Nancey.     Copy of ICM check sent to Dr. Nancey.    Remote monitoring is medically necessary for Heart Failure Management.    90 day Daily Thoracic Impedance ICM trend: 01/13/2024 through 04/12/2024.    12-14 Month Thoracic Impedance ICM trend:     Anthony GORMAN Garner, RN 04/14/2024 5:17 PM

## 2024-04-20 ENCOUNTER — Ambulatory Visit: Payer: Self-pay | Admitting: Cardiovascular Disease

## 2024-05-19 ENCOUNTER — Ambulatory Visit: Admitting: Family Medicine

## 2024-05-19 ENCOUNTER — Encounter: Payer: Self-pay | Admitting: Family Medicine

## 2024-05-19 VITALS — BP 90/49 | HR 65 | Temp 97.7°F | Ht 66.5 in | Wt 170.2 lb

## 2024-05-19 DIAGNOSIS — I5042 Chronic combined systolic (congestive) and diastolic (congestive) heart failure: Secondary | ICD-10-CM | POA: Diagnosis not present

## 2024-05-19 DIAGNOSIS — I2510 Atherosclerotic heart disease of native coronary artery without angina pectoris: Secondary | ICD-10-CM | POA: Diagnosis not present

## 2024-05-19 DIAGNOSIS — F1011 Alcohol abuse, in remission: Secondary | ICD-10-CM

## 2024-05-19 DIAGNOSIS — R454 Irritability and anger: Secondary | ICD-10-CM | POA: Diagnosis not present

## 2024-05-19 LAB — LIPID PANEL
Cholesterol: 123 mg/dL (ref 0–200)
HDL: 50.8 mg/dL (ref >39.00)
LDL Cholesterol: 57 mg/dL (ref 0–99)
NonHDL: 72.18
Total CHOL/HDL Ratio: 2
Triglycerides: 77 mg/dL (ref 0.0–149.0)
VLDL: 15.4 mg/dL (ref 0.0–40.0)

## 2024-05-19 LAB — COMPREHENSIVE METABOLIC PANEL WITH GFR
ALT: 22 U/L (ref 0–53)
AST: 17 U/L (ref 0–37)
Albumin: 4.2 g/dL (ref 3.5–5.2)
Alkaline Phosphatase: 93 U/L (ref 39–117)
BUN: 10 mg/dL (ref 6–23)
CO2: 29 meq/L (ref 19–32)
Calcium: 9.1 mg/dL (ref 8.4–10.5)
Chloride: 104 meq/L (ref 96–112)
Creatinine, Ser: 0.95 mg/dL (ref 0.40–1.50)
GFR: 77.09 mL/min (ref >60.00)
Glucose, Bld: 84 mg/dL (ref 70–99)
Potassium: 3.8 meq/L (ref 3.5–5.1)
Sodium: 140 meq/L (ref 135–145)
Total Bilirubin: 1.1 mg/dL (ref 0.2–1.2)
Total Protein: 6.2 g/dL (ref 6.0–8.3)

## 2024-05-19 MED ORDER — POTASSIUM CHLORIDE CRYS ER 20 MEQ PO TBCR: 20.0000 meq | EXTENDED_RELEASE_TABLET | Freq: Every day | ORAL | 3 refills | Status: AC

## 2024-05-19 MED ORDER — THIAMINE MONONITRATE 100 MG PO TABS: 100.0000 mg | ORAL_TABLET | Freq: Every day | ORAL | 3 refills | Status: AC

## 2024-05-19 MED ORDER — FOLIC ACID 1 MG PO TABS: 1.0000 mg | ORAL_TABLET | Freq: Every day | ORAL | 3 refills | Status: AC

## 2024-05-19 MED ORDER — FLUOXETINE HCL 40 MG PO CAPS: 40.0000 mg | ORAL_CAPSULE | Freq: Every day | ORAL | 3 refills | Status: AC

## 2024-05-19 NOTE — Progress Notes (Signed)
 OFFICE VISIT  05/19/2024  CC:  Chief Complaint  Patient presents with   Medical Management of Chronic Issues    Patient is a 78 y.o. male who presents accompanied by his wife for 52-month follow-up cognitive impairment with personality changes -->anger control problems.  INTERIM HX: Overall Anthony Daugherty is doing well. His wife feels like his memory impairment is stable. She feels like his periods of anger and irritability are still the same.  He tends to sleep a lot in the day.  He is a night person and often watches TV most of the night.  As a result of sleeping a lot in the daytime he is not eating as much.  ROS as above, plus--> no fevers, no CP, no SOB, no wheezing, no cough, no dizziness, no HAs, no rashes, no melena/hematochezia.  No polyuria or polydipsia.  No myalgias or arthralgias.  No focal weakness, paresthesias, or tremors.  No acute vision or hearing abnormalities.  No dysuria or unusual/new urinary urgency or frequency.  No recent changes in lower legs. No n/v/d or abd pain.  No palpitations.     Past Medical History:  Diagnosis Date   Amnestic MCI (mild cognitive impairment with memory loss) 08/09/2021   Aortic insufficiency 10/21/2018   Mild-mod on echo 07/2017.  Moderate 08/2018.  Moderate-severe on echo 04/2019--> TEE 11/15/19 EF 50-55%, no LV dilation or WMA. Mod AV sclerosis w/out stenosis, mod-to-sev AI. 11/2020 moderate AI. severe eccentric by echo 12/2021.  Severe on echo 12/2021->cardiac MRI confirmed.  Moderate on echo 03/2024   Aortic root dilatation 10/2018   11/17/19 45 mm aortic root at the sinuses of Valsalva-> 11/2020 echo->40 mm, unchanged on echo 03/2023   Aortic stenosis, mild    mild to moderate by echo 03/2023 with mean AVG and DVI   Arthritis    L-Spine   Cardiac murmur    Systolic and diastolic as of 07/2017.   Cardiomyopathy 10/12/2010   EF 45% by echo 03/2024 with basal inferior, inferior lateral and inferior septal hypokinesis   Chronic systolic  (congestive) heart failure 06/01/2019   max med mgmt, plus CRT-D placed 05/2019, EF improved to 50% after. Cardiac MRI 03/2022 EF 40%   Coronary artery disease 11/16/2010   a. 2012 - 80% OM lesion. b. Cath 12/2018 - showing 95% prox RCA, 75% OM1, 50% prox-mid LAD. Unsuccessful PCI to 100% chronically occluded RCA summer 2020. CT angio chest w/aorta 08/2021->severe coronary calc's, no aneurism. Mild subclav stenosis d/t athero   Diverticulosis    Erectile dysfunction 08/16/2011   Essential hypertension 07/19/2010   GAD (generalized anxiety disorder)    managed by Dr. Vincente with prozac  20mg  qd   Gout    Usually Right great toe; colchicine  helps well   Gouty arthritis of toe of left foot 05/27/2013   Habitual alcohol use    History of colonoscopy    Initial was normal; 2014-polypectomy and diverticulosis.  Recall 2019.   Hyperlipidemia 10/23/2012   LBBB (left bundle branch block)    CRT-D placed 05/2019   Low back strain 10/24/2013   Multiple lacunar infarcts 11/15/2021   Right caudate nucleus (acute/subacute 11/2021); remote subcentimeter infarct within the inferior right cerebellar hemisphere   Obesity, Class I, BMI 30-34.9    Pulmonary nodule    0.5 cm RLL subpleural nodule.  Pt NEVER SMOKER.  Low risk->no f/u imaging is needed.   Subclavian artery stenosis, left    mild, detected on CT angio chest w/aorta 08/2021   Thyroid   nodule 04/23/2013    Past Surgical History:  Procedure Laterality Date   BIV ICD INSERTION CRT-D N/A 06/01/2019   Procedure: BIV ICD INSERTION CRT-D;  Surgeon: Kelsie Agent, MD;  Location: [facility] INVASIVE CV LAB;  Service: Cardiovascular;  Laterality: N/A;   CARDIAC CATHETERIZATION  10/12/2010   diagnostic only: 1 vessel CAD with TIMI 3 flow, EF 40-45%.  Mild AS.  Medical mgmt of CAD.   COLONOSCOPY     COLONOSCOPY W/ POLYPECTOMY  10/06/2012   Diverticulosis and tubular adenoma w/out high grade dysplasia.  Repeat 5 yrs (Dr. Debrah).   CORONARY ATHERECTOMY N/A  01/08/2019   100% chronic RCA occlusion impossible to clear.  Med mgmt. Procedure: CORONARY ATHERECTOMY;  Surgeon: Wonda Sharper, MD;  Location: [facility] INVASIVE CV LAB;  Service: Cardiovascular;  Laterality: N/A;   POLYPECTOMY     RIGHT HEART CATH AND CORONARY ANGIOGRAPHY N/A 12/24/2018   95% RCA occlusion, 75% obtuse marginal occlusion (3V dz). Procedure: RIGHT HEART CATH AND CORONARY ANGIOGRAPHY;  Surgeon: Court Dorn PARAS, MD;  Location: [facility] INVASIVE CV LAB;  Service: Cardiovascular;  Laterality: N/A;   TEE WITHOUT CARDIOVERSION N/A 11/15/2019   EF 50-55%, no LV dilation or WMA. Mod AV sclerosis w/out stenosis, mod-to-sev AI.  Procedure: TRANSESOPHAGEAL ECHOCARDIOGRAM (TEE);  Surgeon: Okey Vina GAILS, MD;  Location: [facility] ENDOSCOPY;  Service: Cardiovascular;  Laterality: N/A;   TRANSTHORACIC ECHOCARDIOGRAM  09/2010; 07/2017; 08/2018; 10/2019;05/2020   2012:  EF 40-45%, global hypokinesis, mild AS.  07/2017: no chg except mild/mod AR.  08/2018 EF 20-25%, global LV hypok, DD, biatrial enlargemt, mod AR, aortic root 3.8 cm. 10/2019 EF 50%, mild dec RV fxn, mod-sev AR, aor root dil 42 mm. 05/2020 EF 45-50%,grd I DD, mod AI, aorta 38mm, severe LAE. 10/2020 EF 45-50%, mild LVH, global hypokin, grd I DD, mod AI. Echo 12/2021 EF 45-50%, severe AR   TRANSTHORACIC ECHOCARDIOGRAM     03/2023 EF 50-55%, mild MR, mod TR, mod AS and AR.  03/2024 EF 45%, grd I DD, LV regional WMA c/w known 95% RCA stenosis, mild RV dysfxn, AV sclerosis w/out sten or regurg, mod MR, Aortic root 41 mm.    Outpatient Medications Prior to Visit  Medication Sig Dispense Refill   aspirin  EC 81 MG tablet Take 81 mg by mouth daily. Swallow whole.     atorvastatin  (LIPITOR) 80 MG tablet Take 1 tablet (80 mg total) by mouth daily. 90 tablet 3   cyanocobalamin  (VITAMIN B12) 1000 MCG tablet Take 1,000 mcg by mouth daily.     donepezil  (ARICEPT ) 10 MG tablet Take 1 tablet 10 mg daily 90 tablet 3   empagliflozin  (JARDIANCE ) 10 MG TABS tablet TAKE ONE  TABLET DAILY BEFORE BREAKFAST 90 tablet 1   memantine  (NAMENDA ) 5 MG tablet Take 1 tablet  twice a day 180 tablet 3   metoprolol  succinate (TOPROL -XL) 25 MG 24 hr tablet TAKE ONE TABLET DAILY 90 tablet 3   sacubitril -valsartan  (ENTRESTO ) 97-103 MG TAKE 1 TABLET 2 TIMES A DAY 180 tablet 1   spironolactone  (ALDACTONE ) 25 MG tablet Take 1 tablet (25 mg total) by mouth daily. 90 tablet 3   Vitamin D, Ergocalciferol, (DRISDOL) 1.25 MG (50000 UNIT) CAPS capsule Take 50,000 Units by mouth once a week.     FLUoxetine  (PROZAC ) 40 MG capsule Take 1 capsule (40 mg total) by mouth daily. 90 capsule 1   folic acid  (FOLVITE ) 1 MG tablet Take 1 tablet (1 mg total) by mouth daily. 90 tablet 1   potassium chloride   SA (KLOR-CON  M) 20 MEQ tablet Take 1 tablet (20 mEq total) by mouth daily. 90 tablet 1   thiamine  (VITAMIN B1) 100 MG tablet Take 1 tablet (100 mg total) by mouth daily. 90 tablet 1   No facility-administered medications prior to visit.    Allergies  Allergen Reactions   Penicillins Swelling    Did it involve swelling of the face/tongue/throat, SOB, or low BP? No Did it involve sudden or severe rash/hives, skin peeling, or any reaction on the inside of your mouth or nose? No Did you need to seek medical attention at a hospital or doctor's office? No When did it last happen? ~10 years ago If all above answers are "NO", may proceed with cephalosporin use.     Review of Systems As per HPI  PE:    05/19/2024   12:59 PM 12/30/2023    3:04 PM 12/23/2023   12:32 PM  Vitals with BMI  Height 5' 6.5 5' 6.5 5' 6.5  Weight 170 lbs 3 oz 183 lbs 172 lbs  BMI 27.06 29.1 27.35  Systolic 90 110 106  Diastolic 49 70 65  Pulse 65 65 65     Physical Exam  Gen: Alert, well appearing.  Patient is oriented to person, place, time, and situation. AFFECT: pleasant, lucid thought and speech. CV: RRR, soft systolic murmur, no r/g.   LUNGS: CTA bilat, nonlabored resps, good aeration in all lung  fields. EXT: no clubbing or cyanosis.  no edema.    LABS:  Last CBC Lab Results  Component Value Date   WBC 5.2 11/17/2023   HGB 14.4 11/17/2023   HCT 42.7 11/17/2023   MCV 93.9 11/17/2023   MCH WILL FOLLOW 11/12/2019   MCH 32.5 11/12/2019   RDW 13.7 11/17/2023   PLT 206.0 11/17/2023   Last metabolic panel Lab Results  Component Value Date   GLUCOSE 84 11/17/2023   NA 142 11/17/2023   K 3.9 11/17/2023   CL 107 11/17/2023   CO2 27 11/17/2023   BUN 19 11/17/2023   CREATININE 0.97 11/17/2023   GFR 75.45 11/17/2023   CALCIUM  8.8 11/17/2023   PROT 6.0 11/17/2023   ALBUMIN 4.1 11/17/2023   LABGLOB 1.9 04/26/2019   AGRATIO 2.2 04/26/2019   BILITOT 0.9 11/17/2023   ALKPHOS 78 11/17/2023   AST 13 11/17/2023   ALT 15 11/17/2023   ANIONGAP 9 06/02/2019   Last lipids Lab Results  Component Value Date   CHOL 135 10/06/2023   HDL 46 10/06/2023   LDLCALC 71 10/06/2023   TRIG 98 10/06/2023   CHOLHDL 2.9 10/06/2023   Last thyroid  functions Lab Results  Component Value Date   TSH 3.45 04/12/2021   Last vitamin B12 and Folate Lab Results  Component Value Date   VITAMINB12 405 11/17/2023   FOLATE >25.2 11/17/2023   IMPRESSION AND PLAN:  1 difficulty controlling anger.  This is part of the personality change associated with his dementia Stable. Continue fluoxetine  40 mg a day. Encouraged him to try to get back on a better sleep/wake schedule so he can have time to eat during the day and maintain his weight.  #2 chronic combined systolic and diastolic heart failure. He does normal day-to-day physical activities (nothing strenuous or prolonged) and has no symptoms.  Followed by cardiology. Continue Entresto  97-103 1 tablet twice daily, spironolactone  25 mg/day, Toprol -XL 75 mg a day, and Jardiance  10 mg a day. Monitor electrolytes and creatinine today.   #4 CAD. Asymptomatic. Continue atorvastatin  80  mg a day, aspirin  81 mg a day, and Toprol  XL 75 mg a day. Monitor  lipids and metabolic panel today. Forward results to Dr. Shlomo in cardiology to make her aware.  #5 history of habitual alcohol use (no longer drinks). Continue thiamine  and folic acid  supplement.  An After Visit Summary was printed and given to the patient.  FOLLOW UP: Return in about 6 months (around 11/16/2024) for annual CPE (fasting). Next CPE May 2026 Signed:  Gerlene Hockey, MD           05/19/2024

## 2024-05-20 ENCOUNTER — Ambulatory Visit: Payer: Self-pay | Admitting: Family Medicine

## 2024-05-21 ENCOUNTER — Ambulatory Visit: Payer: Self-pay | Admitting: Psychology

## 2024-05-21 ENCOUNTER — Ambulatory Visit: Payer: Medicare PPO | Admitting: Psychology

## 2024-05-21 ENCOUNTER — Encounter: Payer: Self-pay | Admitting: Psychology

## 2024-05-21 DIAGNOSIS — F1011 Alcohol abuse, in remission: Secondary | ICD-10-CM

## 2024-05-21 DIAGNOSIS — F0280 Dementia in other diseases classified elsewhere without behavioral disturbance: Secondary | ICD-10-CM

## 2024-05-21 DIAGNOSIS — F0150 Vascular dementia without behavioral disturbance: Secondary | ICD-10-CM | POA: Diagnosis not present

## 2024-05-21 DIAGNOSIS — G309 Alzheimer's disease, unspecified: Secondary | ICD-10-CM | POA: Diagnosis not present

## 2024-05-21 DIAGNOSIS — R4189 Other symptoms and signs involving cognitive functions and awareness: Secondary | ICD-10-CM

## 2024-05-21 DIAGNOSIS — F04 Amnestic disorder due to known physiological condition: Secondary | ICD-10-CM

## 2024-05-21 DIAGNOSIS — F0390 Unspecified dementia without behavioral disturbance: Secondary | ICD-10-CM

## 2024-05-21 HISTORY — DX: Vascular dementia, unspecified severity, without behavioral disturbance, psychotic disturbance, mood disturbance, and anxiety: F01.50

## 2024-05-21 NOTE — Progress Notes (Signed)
 NEUROPSYCHOLOGICAL EVALUATION [facility] Department of Neurology  Date of Evaluation: May 21, 2024  Reason for Referral:   KULE GASCOIGNE is a 78 y.o. right-handed Caucasian male referred by Camie Sevin, PA-C, to characterize his current cognitive functioning and assist with diagnostic clarity and treatment planning in the context of a previously diagnosed amnestic mild neurocognitive disorder and concerns for progressive cognitive decline.   Assessment and Plan:   Clinical Impression(s): Mr. Wrinkle pattern of performance is suggestive of severe impairment surrounding all aspects of learning and memory. An additional deficit was exhibited across executive functioning, whereas variability was exhibited across processing speed. Outside of an isolated impairment across a line orientation task, other visuospatial abilities were appropriate. Performances were also appropriate relative to age-matched peers across attention/concentration, safety/judgment, and receptive and expressive language. Functionally, per his wife, she has fully taken over medication and financial management and Mr. Gramm no longer drives. Given evidence for cognitive and functional impairment, he best meets diagnostic criteria for a Major Neurocognitive Disorder (dementia) at the present time.  Relative to his initial evaluation in January 2023, Mr. Mclean has exhibited a fair degree of stability over time. Severe memory impairment has been exhibited across all evaluations, with retention rates ranging from 0% to 14% in January 2023 and 0% to 12% currently. The argument could be made for subtle decline surrounding recognition/consolidation aspects of memory, as well as his copy of a complex figure.   The cause for his dementia presentation remains difficult to determine. Concerns surrounding Alzheimer's disease remain. Mr. Dobie was essentially amnestic after brief delays  across all memory tasks and performed very poorly across follow-up recognition trials. This continues to suggest evidence for rapid forgetting and a pronounced storage impairment, both of which are the hallmark testing patterns for this illness. This aligns with concerns surrounding rapid forgetting and repetition held by his wife, as well as what was readily observed throughout the testing process. It is curious and encouraging that expressive language abilities (i.e., confrontation naming and semantic fluency) remain intact and have not shown appreciable decline over time. If Alzheimer's disease is the true culprit of his memory impairment, this illness would appear to be progressing at a very slow rate.  Outside of Alzheimer's disease, there remains the potential for his dementia presentation to be a byproduct of chronic alcoholism. This could be supported by relatively stable performances over time coinciding with Mr. Chery emerging sobriety. 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 His thiamine  levels have historically been low and there has been no follow-up testing found within his medical records since 2023. There are instances where an encephalopathic presentation has been clinically silent or simply unrecognized.  Ultimately, given the significant overlap in testing patterns between clinical Alzheimer's disease and an alcohol-related dementia presentation, I am unable to provide a concrete clinical impression at the present time. Continued medical monitoring will be important moving forward.  Recommendations: Continued sobriety from alcohol will be extremely important moving forward. Ongoing  thiamine  supplementation will also be important moving forward as these levels have historically been low-normal (7-8 nmol/L) when previously examined.   Mr. Dube has already been prescribed a medication aimed to address memory loss and concerns surrounding Alzheimer's disease (i.e., donepezil /Aricept  and memantine /Namenda ). He is  encouraged to continue taking this medication as prescribed. It is important to highlight that this medication has been shown to slow functional decline in some individuals. There is no current treatment which can stop or reverse cognitive decline when caused by a neurodegenerative illness.  Performance across neurocognitive testing is not a strong predictor of an individual's safety operating a motor vehicle. Should his family wish to pursue a formalized driving evaluation, they could reach out to the following agencies: [employer] in [place]: [phone] [facility]: [phone] [facility]: [phone] [facility]: [phone] or [phone]  It will be important for Mr. Ende to have another person with him when in situations where he may need to process information, weigh the pros and cons of different options, and make decisions, in order to ensure that he fully understands and recalls all information to be considered.  If not already done, Mr. Shapley and his family may want to discuss his wishes regarding durable power of attorney and medical decision making, so that he can have input into these choices. If they require legal assistance with this, long-term care resource access, or other aspects of estate planning, they could reach out to [employer] at [phone] for a free consultation. Additionally, they may wish to discuss future plans for caretaking and seek out community options for in home/residential care should they become necessary.  Mr. Ralston is encouraged to attend to lifestyle  factors for brain health (e.g., regular physical exercise, good nutrition habits and consideration of the MIND-DASH diet, regular participation in cognitively-stimulating activities, and general stress management techniques), which are likely to have benefits for both emotional adjustment and cognition. Optimal control of vascular risk factors (including safe cardiovascular exercise and adherence to dietary recommendations) is encouraged. Continued participation in activities which provide mental stimulation and social interaction is also recommended.   Important information should be provided to Mr. Vensel in written format in all instances. This information should be placed in a highly frequented and easily visible location within his home to promote recall. External strategies such as written notes in a consistently used memory journal, visual and nonverbal auditory cues such as a calendar on the refrigerator or appointments with alarm, such as on a cell phone, can also help maximize recall.  To address problems with processing speed, he may wish to consider:   -Ensuring that he is alerted when essential material or instructions are being presented   -Adjusting the speed at which new information is presented   -Allowing for more time in comprehending, processing, and responding in conversation   -Repeating and paraphrasing instructions or conversations aloud  To address problems with fluctuating attention and/or executive dysfunction, he may wish to consider:   -Avoiding external distractions when needing to concentrate   -Limiting exposure to fast paced environments with multiple sensory demands   -Writing down complicated information and using checklists   -Attempting and completing one task at a time (i.e., no multi-tasking)   -Verbalizing aloud each step of a task to maintain focus   -Taking frequent breaks during the completion of steps/tasks to avoid fatigue   -Reducing the amount of  information considered at one time   -Scheduling more difficult activities for a time of day where he is usually most alert  Review of Records:   Past Medical History:  Diagnosis Date   Amnestic MCI (mild cognitive impairment with memory loss) 08/09/2021   Aortic insufficiency 10/21/2018   Mild-mod on echo 07/2017.  Moderate  08/2018.  Moderate-severe on echo 04/2019--> TEE 11/15/19 EF 50-55%, no LV dilation or WMA. Mod AV sclerosis w/out stenosis, mod-to-sev AI. 11/2020 moderate AI. severe eccentric by echo 12/2021.  Severe on echo 12/2021->cardiac MRI confirmed.  Moderate on echo 03/2024   Aortic root dilatation 10/2018   11/17/19 45 mm aortic root at the sinuses of Valsalva-> 11/2020 echo->40 mm, unchanged on echo 03/2023   Aortic stenosis, mild    mild to moderate by echo 03/2023 with mean AVG and DVI   Arthritis    L-Spine   Cardiac murmur    Systolic and diastolic as of 07/2017.   Cardiomyopathy 10/12/2010   EF 45% by echo 03/2024 with basal inferior, inferior lateral and inferior septal hypokinesis   Chronic systolic (congestive) heart failure 06/01/2019   max med mgmt, plus CRT-D placed 05/2019, EF improved to 50% after. Cardiac MRI 03/2022 EF 40%   Coronary artery disease 11/16/2010   a. 2012 - 80% OM lesion. b. Cath 12/2018 - showing 95% prox RCA, 75% OM1, 50% prox-mid LAD. Unsuccessful PCI to 100% chronically occluded RCA summer 2020. CT angio chest w/aorta 08/2021->severe coronary calc's, no aneurism. Mild subclav stenosis d/t athero   Diverticulosis    Erectile dysfunction 08/16/2011   Essential hypertension 07/19/2010   GAD (generalized anxiety disorder)    managed by Dr. Vincente with prozac  20mg  qd   Gout    Usually Right great toe; colchicine  helps well   Gouty arthritis of toe of left foot 05/27/2013   Habitual alcohol use    History of colonoscopy    Initial was normal; 2014-polypectomy and diverticulosis.  Recall 2019.   Hyperlipidemia 10/23/2012   LBBB (left bundle branch  block)    CRT-D placed 05/2019   Low back strain 10/24/2013   Multiple lacunar infarcts 11/15/2021   Right caudate nucleus (acute/subacute 11/2021); remote subcentimeter infarct within the inferior right cerebellar hemisphere   Obesity, Class I, BMI 30-34.9    Pulmonary nodule    0.5 cm RLL subpleural nodule.  Pt NEVER SMOKER.  Low risk->no f/u imaging is needed.   Subclavian artery stenosis, left    mild, detected on CT angio chest w/aorta 08/2021   Thyroid  nodule 04/23/2013    Past Surgical History:  Procedure Laterality Date   BIV ICD INSERTION CRT-D N/A 06/01/2019   Procedure: BIV ICD INSERTION CRT-D;  Surgeon: Kelsie Agent, MD;  Location: [facility] INVASIVE CV LAB;  Service: Cardiovascular;  Laterality: N/A;   CARDIAC CATHETERIZATION  10/12/2010   diagnostic only: 1 vessel CAD with TIMI 3 flow, EF 40-45%.  Mild AS.  Medical mgmt of CAD.   COLONOSCOPY     COLONOSCOPY W/ POLYPECTOMY  10/06/2012   Diverticulosis and tubular adenoma w/out high grade dysplasia.  Repeat 5 yrs (Dr. Debrah).   CORONARY ATHERECTOMY N/A 01/08/2019   100% chronic RCA occlusion impossible to clear.  Med mgmt. Procedure: CORONARY ATHERECTOMY;  Surgeon: Wonda Sharper, MD;  Location: [facility] INVASIVE CV LAB;  Service: Cardiovascular;  Laterality: N/A;   POLYPECTOMY     RIGHT HEART CATH AND CORONARY ANGIOGRAPHY N/A 12/24/2018   95% RCA occlusion, 75% obtuse marginal occlusion (3V dz). Procedure: RIGHT HEART CATH AND CORONARY ANGIOGRAPHY;  Surgeon: Court Dorn PARAS, MD;  Location: [facility] INVASIVE CV LAB;  Service: Cardiovascular;  Laterality: N/A;   TEE WITHOUT CARDIOVERSION N/A 11/15/2019   EF 50-55%, no LV dilation or WMA. Mod AV sclerosis w/out stenosis, mod-to-sev AI.  Procedure: TRANSESOPHAGEAL ECHOCARDIOGRAM (TEE);  Surgeon: Okey Vina GAILS, MD;  Location: [facility] ENDOSCOPY;  Service: Cardiovascular;  Laterality: N/A;   TRANSTHORACIC ECHOCARDIOGRAM  09/2010; 07/2017; 08/2018; 10/2019;05/2020   2012:  EF 40-45%, global hypokinesis,  mild AS.  07/2017: no chg except mild/mod AR.  08/2018 EF 20-25%, global LV hypok, DD, biatrial enlargemt, mod AR, aortic root 3.8 cm. 10/2019 EF 50%, mild dec RV fxn, mod-sev AR, aor root dil 42 mm. 05/2020 EF 45-50%,grd I DD, mod AI, aorta 38mm, severe LAE. 10/2020 EF 45-50%, mild LVH, global hypokin, grd I DD, mod AI. Echo 12/2021 EF 45-50%, severe AR   TRANSTHORACIC ECHOCARDIOGRAM     03/2023 EF 50-55%, mild MR, mod TR, mod AS and AR.  03/2024 EF 45%, grd I DD, LV regional WMA c/w known 95% RCA stenosis, mild RV dysfxn, AV sclerosis w/out sten or regurg, mod MR, Aortic root 41 mm.    Current Outpatient Medications:    aspirin  EC 81 MG tablet, Take 81 mg by mouth daily. Swallow whole., Disp: , Rfl:    atorvastatin  (LIPITOR) 80 MG tablet, Take 1 tablet (80 mg total) by mouth daily., Disp: 90 tablet, Rfl: 3   cyanocobalamin  (VITAMIN B12) 1000 MCG tablet, Take 1,000 mcg by mouth daily., Disp: , Rfl:    donepezil  (ARICEPT ) 10 MG tablet, Take 1 tablet 10 mg daily, Disp: 90 tablet, Rfl: 3   empagliflozin  (JARDIANCE ) 10 MG TABS tablet, TAKE ONE TABLET DAILY BEFORE BREAKFAST, Disp: 90 tablet, Rfl: 1   FLUoxetine  (PROZAC ) 40 MG capsule, Take 1 capsule (40 mg total) by mouth daily., Disp: 90 capsule, Rfl: 3   folic acid  (FOLVITE ) 1 MG tablet, Take 1 tablet (1 mg total) by mouth daily., Disp: 90 tablet, Rfl: 3   memantine  (NAMENDA ) 5 MG tablet, Take 1 tablet  twice a day, Disp: 180 tablet, Rfl: 3   metoprolol  succinate (TOPROL -XL) 25 MG 24 hr tablet, TAKE ONE TABLET DAILY, Disp: 90 tablet, Rfl: 3   potassium chloride  SA (KLOR-CON  M) 20 MEQ tablet, Take 1 tablet (20 mEq total) by mouth daily., Disp: 90 tablet, Rfl: 3   sacubitril -valsartan  (ENTRESTO ) 97-103 MG, TAKE 1 TABLET 2 TIMES A DAY, Disp: 180 tablet, Rfl: 1   spironolactone  (ALDACTONE ) 25 MG tablet, Take 1 tablet (25 mg total) by mouth daily., Disp: 90 tablet, Rfl: 3   thiamine  (VITAMIN B1) 100 MG tablet, Take 1 tablet (100 mg total) by mouth daily., Disp:  90 tablet, Rfl: 3   Vitamin D, Ergocalciferol, (DRISDOL) 1.25 MG (50000 UNIT) CAPS capsule, Take 50,000 Units by mouth once a week., Disp: , Rfl:      12/23/2023    1:00 PM 06/23/2023    4:00 PM 12/13/2021   12:00 PM 08/01/2017    8:57 AM  MMSE - Mini Mental State Exam  Orientation to time 1 1 5 5    Orientation to Place 5 5 5 5    Registration 3 3 3 3    Attention/ Calculation 5 5 5 3    Recall 0 1 2 3    Language- name 2 objects 2 2 2 2    Language- repeat 1 1 1 1   Language- follow 3 step command 3 3 3 3    Language- read & follow direction 1 1 1 1    Write a sentence 1 1 1 1    Copy design 1 1 1 1    Total score 23 24 29 28         09/17/2023   11:41 AM 08/29/2021    2:47 PM  6CIT Screen  What Year? 4 points  4 points  What month? 3 points 0 points  What time? 0 points 0 points  Count back from 20 0 points 0 points  Months in reverse 2 points 0 points  Repeat phrase 10 points 4 points  Total Score 19 points 8 points   Neuroimaging: Brain MRI on 11/12/2021 suggested mild generalized cerebral atrophy, a 6 mm focus of restricted diffuse within the right caudate nucleus compatible with an acute/early subacute lacunar infarction, and mild background microvascular ischemic disease. No other neuroimaging was available for review.   Clinical Interview:   The following information was obtained during a clinical interview with Mr. Staniszewski prior to cognitive testing.  Cognitive Symptoms: Decreased short-term memory: Denied. His wife previously reported more pronounced concerns, stating that Mr. Mcisaac sons had also expressed concerns surrounding deteriorating memory. She noted that Mr. Resende will have trouble recalling details of past conversations and will repeat himself or ask repetitive questions often. Concerns were said to have been present for the past 4-5 years and progressively declined. Acutely, relative to the previous evaluation, she reported the potential for mild memory decline.   Decreased long-term memory: Denied. Decreased attention/concentration: Denied. Reduced processing speed: Denied. Difficulties with executive functions: Denied. Mr. Okimoto previously denied trouble with impulsivity or any significant personality changes. His wife reported a longstanding history of irritability and trouble managing his anger. Whereas medications were deemed at being helpful in managing mood symptoms at the time of his December 2023 evaluation, she noted current exacerbations surrounding anger and irritability. Difficulties with emotion regulation: Denied. (However, see above).  Difficulties with receptive language: Denied. Difficulties with word finding: Denied. Decreased visuoperceptual ability: Denied.  Difficulties completing ADLs: Mr. Clayburn denied all concerns. However, per his wife, she has fully taken over medication management out of necessity. She also noted that Mr. Confer no longer drives due to safety and navigational concerns. She manages finances and bill paying which is longstanding in nature.   Additional Medical History: History of traumatic brain injury/concussion: Endorsed. When he was around 85-11 years old, he reported hitting the back of his head on a diving board, leading to him requiring about 24 stiches in the occipital region. No persisting difficulties or loss in consciousness were reported. No other potential head injuries were described.  History of stroke: Denied. History of seizure activity: Denied. History of known exposure to toxins: Denied. Symptoms of chronic pain: Denied. Experience of frequent headaches/migraines: Denied. Frequent instances of dizziness/vertigo: Denied.   Sensory changes: He wears glasses with benefit. Other sensory changes/difficulties (e.g., hearing, taste, or smell) were denied.  Balance/coordination difficulties: Denied. He also denied any recent falls.  Other motor difficulties: Denied.  Sleep  History: Estimated hours obtained each night: 6-8 hours.  Difficulties falling asleep: Denied. His wife previously reported that he commonly stays up late and subsequently sleeps later into the morning. She described him having an abnormal sleep cycle because of this, noting that there may be some nights where he remains awake throughout the night. This was said to be stable. Difficulties staying asleep: Denied. Feels rested and refreshed upon awakening: Endorsed.   History of snoring: Denied. History of waking up gasping for air: Denied. Witnessed breath cessation while asleep: Denied.   History of vivid dreaming: Endorsed. Excessive movement while asleep: Denied. Instances of acting out his dreams: Denied.  Psychiatric/Behavioral Health History: Depression: He described his current mood as fine and denied any prior mental health concerns or diagnoses. His wife previously reported that he had been prescribed Prozac   for an extended period of time, likely to treat anger and some mild anxiety concerns. However, mood medications have seemed ineffective lately and his wife has had discussions with his prescribing physicians about adjustments or additions. Current or remote suicidal ideation, intent, or plan was denied.  Anxiety: Denied. Mania: Denied. Trauma History: Denied. Visual/auditory hallucinations: Denied. Delusional thoughts: Denied.   Tobacco: Endorsed. He reported regularly using chewing tobacco products.  Alcohol: Historically, his wife reported Mr. Wingert having a long history of alcohol abuse and prior DUI. As recent as September 2022, medical records suggested the consumption of 28 cans of beer per week. At the time of his January 2023 evaluation this number had dropped to around 12 beers per week. At the time of his December 2023 evaluation, this had further dropped to 7 beers per week. During the current interview, he reported minimal alcohol consumption. His wife was in  agreement. Prior thiamine  testing was slightly low and supplementation had been recommended. His follow through with this recommendation is unknown. His most recent thiamine  labs were performed on 10/04/2021 (8 nmol/L). Recreational drugs: Denied.  Family History: Problem Relation Age of Onset   Congenital heart disease Brother        cardiomyopathy   Alzheimer's disease Mother    COPD Father    Heart failure Father        congestive   Colon cancer Neg Hx    Esophageal cancer Neg Hx    Stomach cancer Neg Hx    Rectal cancer Neg Hx    Colon polyps Neg Hx    This information was confirmed by Mr. Mulvehill.  Academic/Vocational History: Highest level of educational attainment: 16 years. He graduated from high school and earned a Oncologist in business administration. He described himself as an average (C) student in academic settings. Higher level math represented a likely relative weakness.  History of developmental delay: Denied. History of grade repetition: Denied. Enrollment in special education courses: Denied. History of LD/ADHD: Denied.   Employment: Retired. 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   Evaluation Results:   Behavioral Observations: Mr. Cazarez was accompanied by his wife, arrived to his appointment on time, and was appropriately dressed and groomed. He appeared alert. Observed gait and station were within normal limits. Gross motor functioning appeared intact upon informal observation and no abnormal movements (e.g., tremors) were noted. His affect was generally relaxed and positive. He did not recognize me or appear to remember ever having gone through this testing process in the past. Spontaneous speech was fluent and word finding difficulties were not observed during the clinical interview. Thought processes were  coherent, organized, and normal in content. Insight into his cognitive difficulties appeared very poor in that he continues to deny all cognitive concerns despite several evaluations over the years which provide objective evidence for worsening cognitive impairment. His degree of anosognosia is quite significant.   During testing, rapid forgetting was very apparent. He was very repetitive in asking the psychometrist about his performance and appeared to forget her responses about being unable to provide real-time data in a matter of minutes. He was also noted to commonly forget recently explained instructions and often required repetition. Sustained attention was appropriate. Task engagement was adequate and he persisted when challenged. Overall, Mr. Anastacio was cooperative with the clinical interview and subsequent testing procedures.   Adequacy of Effort: The validity of neuropsychological  testing is limited by the extent to which the individual being tested may be assumed to have exerted adequate effort during testing. Mr. Robles expressed his intention to perform to the best of his abilities and exhibited adequate task engagement and persistence. Scores across stand-alone and embedded performance validity measures were within expectation. As such, the results of the current evaluation are believed to be a valid representation of Mr. Fukushima current cognitive functioning.  Test Results: Mr. Marsalis was poorly oriented at the time of the current evaluation. He incorrectly stated his age (38). He also was unable to state the current year (2020 or 2021), month, date, day of the week, time, or name of the current clinic.  Intellectual abilities based upon educational and vocational attainment were estimated to be in the average range. Premorbid abilities were estimated to be within the average range based upon a single-word reading test.   Processing speed was variable, ranging from the  exceptionally low to above average normative ranges. Basic attention was average. More complex attention (e.g., working memory) was below average to average. Executive functioning was exceptionally low to below average. He performed in the above average range across a task assessing safety and judgment.   Assessed receptive language abilities were average. Assessed expressive language (e.g., verbal fluency and confrontation naming) was mildly variable but overall appropriate, ranging from the below average to above average.     Assessed visuospatial/visuoconstructional abilities were average outside of an isolated impairment across a line orientation task.    Learning (i.e., encoding) of novel verbal information was exceptionally low to below average. Spontaneous delayed recall (i.e., retrieval) of previously learned information was exceptionally low. Retention rates were 0% across a list learning task, 0% across a story learning task, and 12% across a figure drawing task. Performance across recognition tasks was exceptionally low to well below average, suggesting negligible evidence for information consolidation.   Results of emotional screening instruments suggested that recent symptoms of generalized anxiety were in the minimal range, while symptoms of depression were within normal limits. A screening instrument assessing recent sleep quality suggested the presence of minimal sleep dysfunction.  Table of Scores:   Note: This summary of test scores accompanies the interpretive report and should not be considered in isolation without reference to the appropriate sections in the text. Descriptors are based on appropriate normative data and may be adjusted based on clinical judgment. Terms such as Within Normal Limits and Outside Normal Limits are used when a more specific description of the test score cannot be determined. Descriptors refer to the current evaluation only.         Percentile -  Normative Descriptor > 98 - Exceptionally High 91-97 - Well Above Average 75-90 - Above Average 25-74 - Average 9-24 - Below Average 2-8 - Well Below Average < 2 - Exceptionally Low         Validity: January 2023 December 2023 Current  DESCRIPTOR         DCT: --- --- --- --- Within Normal Limits  RBANS EI: --- --- --- --- Within Normal Limits  WAIS-IV RDS: --- --- --- --- Within Normal Limits         Orientation:        Raw Score Raw Score Raw Score Percentile   NAB Orientation, Form 1 15/29 20/29 16/29  --- ---         Cognitive Screening:        Raw Score Raw Score Raw Score Percentile   SLUMS:  17/30 20/30 18/30  --- ---         RBANS, Form A: Standard Score/ Scaled Score Standard Score/ Scaled Score Standard Score/ Scaled Score Percentile   Total Score 66 67 65 1 Exceptionally Low  Immediate Memory 69 73 69 2 Well Below Average    List Learning 3 4 3 1  Exceptionally Low    Story Memory 6 6 6 9  Below Average  Visuospatial/Constructional 84 87 78 7 Well Below Average    Figure Copy 12 10 9  37 Average    Line Orientation 7/20 12/20 9/20 <2 Exceptionally Low  Language 92 82 90 25 Average    Picture Naming 10/10 10/10 10/10  51-75 Average    Semantic Fluency 7 3 6 9  Below Average  Attention 82 88 82 12 Below Average    Digit Span 7 11 8 25  Average    Coding 7 5 6 9  Below Average  Delayed Memory 40 40 44 <1 Exceptionally Low    List Recall 0/10 0/10 0/10 <2 Exceptionally Low    List Recognition 12/20 11/20 13/20  <2 Exceptionally Low    Story Recall 2 1 1  <1 Exceptionally Low    Story Recognition 8/12 8/12 6/12 3-4 Well Below Average    Figure Recall 3 3 3 1  Exceptionally Low    Figure Recognition 4/8 3/8 1/8 1 Exceptionally Low          Intellectual Functioning:        Standard Score Standard Score Standard Score Percentile   Test of Premorbid Functioning: 94 94 99 47 Average         Attention/Executive Function:       Trail Making Test (TMT): Raw Score (T Score) Raw  Score (T Score) Raw Score (T Score) Percentile     Part A 33 secs.,  0 errors (52) 36 secs.,  0 errors (49) 27 secs.,  0 errors (58) 79 Above Average    Part B Discontinued Discontinued 169 secs.,  2 errors (35) 7 Well Below Average           Scaled Score Scaled Score Scaled Score Percentile   WAIS-IV Digit Span: 7 8 8 25  Average    Forward 9 10 10  50 Average    Backward 5 6 6 9  Below Average    Sequencing 9 8 8 25  Average          Scaled Score Scaled Score Scaled Score Percentile   WAIS-IV Similarities: 11 10 10  50 Average         D-KEFS Color-Word Interference Test: Raw Score (Scaled Score) Raw Score (Scaled Score) Raw Score (Scaled Score) Percentile     Color Naming --- --- 43 secs. (6) 9 Below Average    Word Reading --- --- 41 secs. (2) <1 Exceptionally Low    Inhibition --- --- 84 secs. (8) 25 Average      Total Errors --- --- 21 errors (1) <1 Exceptionally Low    Inhibition/Switching --- --- Discontinued --- Impaired      Total Errors --- --- --- --- ---         D-KEFS Verbal Fluency Test: Raw Score (Scaled Score) Raw Score (Scaled Score) Raw Score (Scaled Score) Percentile     Letter Total Correct 34 (10) 37 (11) 44 (13) 84 Above Average    Category Total Correct 42 (14) 38 (12) 41 (13) 84 Above Average    Category Switching Total Correct 12 (10) 3 (1) 9 (6) 9 Below Average  Category Switching Accuracy 9 (8) 2 (1) 6 (5) 5 Well Below Average      Total Set Loss Errors 2 (10) 16 (1) 3 (9) 37 Average      Total Repetition Errors 18 (1) 14 (1) 15 (1) <1 Exceptionally Low         NAB Executive Functions Module, Form 1: T Score T Score T Score Percentile     Judgment 53 45 58 79 Above Average         Language:       Verbal Fluency Test: Raw Score (T Score) Raw Score (T Score) Raw Score (T Score) Percentile     Phonemic Fluency (FAS) 34 (45) 37 (45) 44 (53) 62 Average    Animal Fluency 24 (60) 20 (53) 19 (53) 62 Average          NAB Language Module, Form 1: T  Score T Score T Score Percentile     Auditory Comprehension 56 29 56 73 Average    Naming 30/31 (56) 31/31 (59) 31/31 (59) 82 Above Average         Visuospatial/Visuoconstruction:        Raw Score Raw Score Raw Score Percentile   Clock Drawing: 10/10 10/10 10/10  --- Within Normal Limits          Scaled Score Scaled Score Scaled Score Percentile   WAIS-IV Block Design: 8 8 9  37 Average         Mood and Personality:        Raw Score Raw Score Raw Score Percentile   Geriatric Depression Scale: 0 5 4 --- Within Normal Limits  Geriatric Anxiety Scale: 3 0 0 --- Minimal    Somatic 3 0 0 --- Minimal    Cognitive 0 0 0 --- Minimal    Affective 0 0 0 --- Minimal         Additional Questionnaires:        Raw Score Raw Score Raw Score Percentile   PROMIS Sleep Disturbance Questionnaire: 17 13 12  --- None to Slight   Informed Consent and Coding/Compliance:   The current evaluation represents a clinical evaluation for the purposes previously outlined by the referral source and is in no way reflective of a forensic evaluation.   Mr. Ronan was provided with a verbal description of the nature and purpose of the present neuropsychological evaluation. Also reviewed were the foreseeable risks and/or discomforts and benefits of the procedure, limits of confidentiality, and mandatory reporting requirements of this provider. The patient was given the opportunity to ask questions and receive answers about the evaluation. Oral consent to participate was provided by the patient.   This evaluation was conducted by Arthea KYM Maryland, Ph.D., ABPP-CN, board certified clinical neuropsychologist. Mr. Lantzy completed a clinical interview with Dr. Maryland, billed as one unit [phone], and 125 minutes of cognitive testing and scoring, billed as one unit [phone] and three additional units 96139. Psychometrist Lonell Jude, B.S. assisted Dr. Maryland with test administration and scoring procedures. As a separate and discrete  service, one unit [phone] and two units 96133 (180 minutes) were billed for Dr. Loralee time spent in interpretation and report writing.

## 2024-05-21 NOTE — Progress Notes (Signed)
   Psychometrician Note   Cognitive testing was administered to Anthony Daugherty by Lonell Jude, B.S. (psychometrist) under the supervision of Dr. Arthea KYM Maryland, Ph.D., ABPP, licensed psychologist on 05/21/2024. [name] did not appear overtly distressed by the testing session per behavioral observation or responses across self-report questionnaires. Rest breaks were offered.   The battery of tests administered was selected by Dr. Arthea KYM Maryland, Ph.D., ABPP with consideration to [name] current level of functioning, the nature of his symptoms, emotional and behavioral responses during interview, level of literacy, observed level of motivation/effort, and the nature of the referral question. This battery was communicated to the psychometrist. Communication between Dr. Arthea KYM Maryland, Ph.D., ABPP and the psychometrist was ongoing throughout the evaluation and Dr. Arthea KYM Maryland, Ph.D., ABPP was immediately accessible at all times. Dr. Zachary C. Merz, Ph.D., ABPP provided supervision to the psychometrist on the date of this service to the extent necessary to assure the quality of all services provided.    Anthony Daugherty will return within approximately 1-2 weeks for an interactive feedback session with Dr. Maryland at which time his test performances, clinical impressions, and treatment recommendations will be reviewed in detail. Mr. Vertz understands he can contact our office should he require our assistance before this time.  A total of 125 minutes of billable time were spent face-to-face with [name] by the psychometrist. This includes both test administration and scoring time. Billing for these services is reflected in the clinical report generated by Dr. Arthea KYM Maryland, Ph.D., ABPP  This note reflects time spent with the psychometrician and does not include test scores or any clinical interpretations made by Dr. Maryland. The full report will follow in a separate  note.

## 2024-05-24 ENCOUNTER — Encounter

## 2024-05-26 ENCOUNTER — Ambulatory Visit: Attending: Cardiovascular Disease

## 2024-05-26 ENCOUNTER — Telehealth: Payer: Self-pay

## 2024-05-26 DIAGNOSIS — I5042 Chronic combined systolic (congestive) and diastolic (congestive) heart failure: Secondary | ICD-10-CM | POA: Diagnosis not present

## 2024-05-26 DIAGNOSIS — Z95810 Presence of automatic (implantable) cardiac defibrillator: Secondary | ICD-10-CM | POA: Diagnosis not present

## 2024-05-26 NOTE — Telephone Encounter (Signed)
 Attempted ICM call and left message the [facility] app is showing it is disconnected. Advised to open app and send report to reconnect and check fluid levels.  Left call back number if assistance is needed.

## 2024-05-26 NOTE — Telephone Encounter (Signed)
 Attempted ICM Call to patient and left message advising phone app is disconnected and not receiving remote transmissions. Advised to open phone app and send manual transmission. Left call back number for assistance.

## 2024-05-26 NOTE — Progress Notes (Signed)
 EPIC Encounter for ICM Monitoring  Patient Name: Anthony Daugherty is a 78 y.o. male Date: 05/26/2024 Primary Care Physican: Candise Aleene DEL, MD Primary Cardiologist: Shlomo Electrophysiologist: [name] Pacing: 99%         11/14/2021 Office Weight: 160 lbs 04/17/2023 Weight: 171 lbs 07/28/2023 Weight: 171 lbs 08/19/2023 Office Weight: 171 lbs 11/17/2023 Office Weight: 175 lbs   AT/AF Burden: <1%                                                       Attempted call to wife/patient and unable to reach.  Left detailed message per DPR regarding transmission.  Transmission results reviewed.    Diet:  Does not limit salt intake and uses salt shaker.    Since 04/12/2024 ICM Remote Transmission: [facility] thoracic impedance suggesting normal fluid since 05/24/2024.  Impedance suggesting possible fluid accumulation from 04/21/2024-05/03/2024 followed by possible dryness from 05/04/2024-05/13/2024 and possible fluid return from 05/15/2024-05/23/2024.   Prescribed: Potassium 20 mEq take 1 tablet by mouth daily Spironolactone  25 mg take 1 tablet by mouth daily    Labs: 11/17/2023 Creatinine 0.97, BUN 19, Potassium 3.9, Sodium 142, GFR 75.45 03/06/2023 Creatinine 0.97, BUN 12, Potassium 4.1, Sodium 143, GFR 81 10/09/2022 Creatinine 0.92, BUN 11, Potassium 4.1, Sodium 139, GFR 81.03 A complete set of results can be found in Results Review.   Recommendations: Left voice mail with ICM number and encouraged to call if experiencing any fluid symptoms.   Follow-up plan: ICM clinic phone appointment on 06/28/2024.   91 day device clinic remote transmission 07/05/2024.   EP/Cardiology Office Visits:  Recall 02/15/2024 with Dr Shlomo.  Recall 12/24/2024 with Dr Nancey.     Copy of ICM check sent to Dr. Nancey.    Remote monitoring is medically necessary for Heart Failure Management.    Daily Thoracic Impedance ICM trend: 02/26/2024 through 05/26/2024.    12-14 Month Thoracic Impedance ICM trend:      [name], RN 05/26/2024 2:05 PM

## 2024-05-26 NOTE — Telephone Encounter (Signed)
 Remote ICM transmission received.  Attempted call to wife/patient regarding ICM remote transmission.  Left detailed message per DPR with ICM phone number to return call for any questions, concerns or fluid symptoms.

## 2024-05-27 ENCOUNTER — Ambulatory Visit: Payer: Medicare PPO | Admitting: Psychology

## 2024-05-27 DIAGNOSIS — G309 Alzheimer's disease, unspecified: Secondary | ICD-10-CM | POA: Diagnosis not present

## 2024-05-27 DIAGNOSIS — F0280 Dementia in other diseases classified elsewhere without behavioral disturbance: Secondary | ICD-10-CM | POA: Diagnosis not present

## 2024-05-27 DIAGNOSIS — F0150 Vascular dementia without behavioral disturbance: Secondary | ICD-10-CM | POA: Diagnosis not present

## 2024-05-27 DIAGNOSIS — F04 Amnestic disorder due to known physiological condition: Secondary | ICD-10-CM | POA: Diagnosis not present

## 2024-05-27 NOTE — Progress Notes (Signed)
   Neuropsychology Feedback Session Jolynn DEL. [facility] Department of Neurology  Reason for Referral:   Anthony Daugherty is a 78 y.o. right-handed Caucasian male referred by Camie Sevin, PA-C, to characterize his current cognitive functioning and assist with diagnostic clarity and treatment planning in the context of a previously diagnosed amnestic mild neurocognitive disorder and concerns for progressive cognitive decline.   Feedback:   Anthony Daugherty completed a comprehensive neuropsychological evaluation on 05/21/2024. Please refer to that encounter for the full report and recommendations. Briefly, results suggested severe impairment surrounding all aspects of learning and memory. An additional deficit was exhibited across executive functioning, whereas variability was exhibited across processing speed. Outside of an isolated impairment across a line orientation task, other visuospatial abilities were appropriate. Relative to his initial evaluation in January 2023, Anthony Daugherty has exhibited a fair degree of stability over time. Severe memory impairment has been exhibited across all evaluations, with retention rates ranging from 0% to 14% in January 2023 and 0% to 12% currently. The argument could be made for subtle decline surrounding recognition/consolidation aspects of memory, as well as his copy of a complex figure. The cause for his dementia presentation remains difficult to determine. Concerns surrounding Alzheimer's disease remain. Anthony Daugherty was essentially amnestic after brief delays across all memory tasks and performed very poorly across follow-up recognition trials. This continues to suggest evidence for rapid forgetting and a pronounced storage impairment, both of which are the hallmark testing patterns for this illness. This aligns with concerns surrounding rapid forgetting and repetition held by his wife, as well as what was readily observed throughout the  testing process. It is curious and encouraging that expressive language abilities (i.e., confrontation naming and semantic fluency) remain intact and have not shown appreciable decline over time. If Alzheimer's disease is the true culprit of his memory impairment, this illness would appear to be progressing at a very slow rate. Outside of Alzheimer's disease, there remains the potential for his dementia presentation to be a byproduct of chronic alcoholism. This could be supported by relatively stable performances over time coinciding with Anthony Daugherty emerging sobriety. 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 His thiamine  levels have historically been low and there has been no follow-up testing found within his medical records since 2023. There are instances where an encephalopathic presentation has been clinically silent or simply unrecognized.   Anthony Daugherty was accompanied by his wife during the current feedback session. Content of the current session focused on the results of his neuropsychological evaluation. Anthony Daugherty was given the opportunity to ask questions and his questions were answered. He was encouraged to reach out should additional questions arise. A copy of his report was provided at the conclusion of the visit.      One unit 96132 (33 minutes) was billed for Dr. Loralee time spent preparing for, conducting, and documenting the current feedback session with Anthony Daugherty.

## 2024-05-31 ENCOUNTER — Ambulatory Visit

## 2024-06-17 ENCOUNTER — Other Ambulatory Visit: Payer: Self-pay | Admitting: Cardiology

## 2024-06-24 ENCOUNTER — Ambulatory Visit: Admitting: Physician Assistant

## 2024-06-24 VITALS — BP 112/73 | HR 67 | Ht 66.0 in | Wt 172.2 lb

## 2024-06-24 DIAGNOSIS — F04 Amnestic disorder due to known physiological condition: Secondary | ICD-10-CM | POA: Diagnosis not present

## 2024-06-24 DIAGNOSIS — R413 Other amnesia: Secondary | ICD-10-CM

## 2024-06-24 MED ORDER — MEMANTINE HCL 10 MG PO TABS: ORAL_TABLET | ORAL | 3 refills | Status: AC

## 2024-06-24 NOTE — Patient Instructions (Addendum)
 It was a pleasure to see you today at our office.   Recommendations:  Follow up in 6  months Continue taking donepezil  10 mg daily  Continue Memantine , increase to 10 mg tablets  twice daily. Continue taking ASA baby and statin  Continue control of mood with primary doctor   Whom to call:  Memory  decline, memory medications: Call our office [phone]   For psychiatric meds, mood meds: Please have your primary care physician manage these medications.           RECOMMENDATIONS FOR ALL PATIENTS WITH MEMORY PROBLEMS: 1. Continue to exercise (Recommend 30 minutes of walking everyday, or 3 hours every week) 2. Increase social interactions - continue going to [place] and enjoy social gatherings with friends and family 3. Eat healthy, avoid fried foods and eat more fruits and vegetables 4. Maintain adequate blood pressure, blood sugar, and blood cholesterol level. Reducing the risk of stroke and cardiovascular disease also helps promoting better memory. 5. Avoid stressful situations. Live a simple life and avoid aggravations. Organize your time and prepare for the next day in anticipation. 6. Sleep well, avoid any interruptions of sleep and avoid any distractions in the bedroom that may interfere with adequate sleep quality 7. Avoid sugar, avoid sweets as there is a strong link between excessive sugar intake, diabetes, and cognitive impairment We discussed the Mediterranean diet, which has been shown to help patients reduce the risk of progressive memory disorders and reduces cardiovascular risk. This includes eating fish, eat fruits and green leafy vegetables, nuts like almonds and hazelnuts, walnuts, and also use olive oil. Avoid fast foods and fried foods as much as possible. Avoid sweets and sugar as sugar use has been linked to worsening of memory function.  There is always a concern of gradual progression of memory problems. If this is the case, then we may need to adjust level of  care according to patient needs. Support, both to the patient and caregiver, should then be put into place.    FALL PRECAUTIONS: Be cautious when walking. Scan the area for obstacles that may increase the risk of trips and falls. When getting up in the mornings, sit up at the edge of the bed for a few minutes before getting out of bed. Consider elevating the bed at the head end to avoid drop of blood pressure when getting up. Walk always in a well-lit room (use night lights in the walls). Avoid area rugs or power cords from appliances in the middle of the walkways. Use a walker or a cane if necessary and consider physical therapy for balance exercise. Get your eyesight checked regularly.  FINANCIAL OVERSIGHT: Supervision, especially oversight when making financial decisions or transactions is also recommended.  HOME SAFETY: Consider the safety of the kitchen when operating appliances like stoves, microwave oven, and blender. Consider having supervision and share cooking responsibilities until no longer able to participate in those. Accidents with firearms and other hazards in the house should be identified and addressed as well.   ABILITY TO BE LEFT ALONE: If patient is unable to contact 911 operator, consider using LifeLine, or when the need is there, arrange for someone to stay with patients. Smoking is a fire hazard, consider supervision or cessation. Risk of wandering should be assessed by caregiver and if detected at any point, supervision and safe proof recommendations should be instituted.  MEDICATION SUPERVISION: Inability to self-administer medication needs to be constantly addressed. Implement a mechanism to ensure safe administration of the  medications.   DRIVING: Regarding driving, in patients with progressive memory problems, driving will be impaired. We advise to have someone else do the driving if trouble finding directions or if minor accidents are reported. Independent driving  assessment is available to determine safety of driving.   If you are interested in the driving assessment, you can contact the following:  [employer] in [place] [phone]  [facility] [phone]  [facility] [phone]  [facility] [phone] or [phone]
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
## 2024-06-28 ENCOUNTER — Ambulatory Visit: Attending: Cardiovascular Disease

## 2024-06-28 DIAGNOSIS — I5042 Chronic combined systolic (congestive) and diastolic (congestive) heart failure: Secondary | ICD-10-CM

## 2024-06-28 DIAGNOSIS — Z95810 Presence of automatic (implantable) cardiac defibrillator: Secondary | ICD-10-CM

## 2024-06-29 ENCOUNTER — Telehealth: Payer: Self-pay

## 2024-06-29 NOTE — Progress Notes (Signed)
 EPIC Encounter for ICM Monitoring  Patient Name: Anthony Daugherty is a 78 y.o. male Date: 06/29/2024 Primary Care Physican: Candise Aleene DEL, MD Primary Cardiologist: Shlomo Electrophysiologist: [name] Pacing: 99%         11/14/2021 Office Weight: 160 lbs 04/17/2023 Weight: 171 lbs 07/28/2023 Weight: 171 lbs 08/19/2023 Office Weight: 171 lbs 11/17/2023 Office Weight: 175 lbs 06/24/2024 Office Weight: 172 lbs   AT/AF Burden: <1%                                                       Attempted call to wife/patient and unable to reach.  Left detailed message per DPR regarding transmission.  Transmission results reviewed.    Diet:  Does not limit salt intake and uses salt shaker.    Since 05/26/2024 ICM Remote Transmission: CorVue thoracic impedance suggesting possible fluid accumulation starting 06/23/2024.  Also suggesting possible fluid accumulation from 06/01/2024-06/09/2024 and possible dryness from 06/10/2024-06/21/2024.   Prescribed: Potassium 20 mEq take 1 tablet by mouth daily Spironolactone  25 mg take 1 tablet by mouth daily    Labs: 05/19/2024 Creatinine 0.95, BUN 10, Potassium 3.8, Sodium 140, GFR 77.09 11/17/2023 Creatinine 0.97, BUN 19, Potassium 3.9, Sodium 142, GFR 75.45 03/06/2023 Creatinine 0.97, BUN 12, Potassium 4.1, Sodium 143, GFR 81 10/09/2022 Creatinine 0.92, BUN 11, Potassium 4.1, Sodium 139, GFR 81.03 A complete set of results can be found in Results Review.   Recommendations:   Left voice mail with ICM number and encouraged to call if experiencing any fluid symptoms.   Follow-up plan: ICM clinic phone appointment on 07/05/2024 to recheck fluid levels.   91 day device clinic remote transmission 07/05/2024.   EP/Cardiology Office Visits:  Recall 02/15/2024 with Dr Shlomo.  Recall 12/24/2024 with Dr Nancey.     Copy of ICM check sent to Dr. Nancey.     Remote monitoring is medically necessary for Heart Failure Management.    Daily Thoracic Impedance ICM  trend: 03/30/2024 through 06/28/2024.    12-14 Month Thoracic Impedance ICM trend:     [name], RN 06/29/2024 8:28 AM

## 2024-06-29 NOTE — Telephone Encounter (Signed)
 Remote ICM transmission received.  Attempted call to wife/patient regarding ICM remote transmission.  Left detailed message per DPR with ICM phone number to return call for any questions, concerns or fluid symptoms.

## 2024-07-05 ENCOUNTER — Telehealth: Payer: Self-pay

## 2024-07-05 ENCOUNTER — Ambulatory Visit: Attending: Cardiovascular Disease

## 2024-07-05 ENCOUNTER — Ambulatory Visit: Payer: Medicare PPO

## 2024-07-05 DIAGNOSIS — I5042 Chronic combined systolic (congestive) and diastolic (congestive) heart failure: Secondary | ICD-10-CM

## 2024-07-05 DIAGNOSIS — Z95810 Presence of automatic (implantable) cardiac defibrillator: Secondary | ICD-10-CM

## 2024-07-05 NOTE — Telephone Encounter (Signed)
Remote ICM transmission received.  Attempted call to wife regarding ICM remote transmission and no answer. 

## 2024-07-05 NOTE — Progress Notes (Signed)
 EPIC Encounter for ICM Monitoring  Patient Name: Anthony Daugherty is a 78 y.o. male Date: 07/05/2024 Primary Care Physican: Candise Aleene DEL, MD Primary Cardiologist: Shlomo Electrophysiologist: [name] Pacing: 99%         11/14/2021 Office Weight: 160 lbs 04/17/2023 Weight: 171 lbs 07/28/2023 Weight: 171 lbs 08/19/2023 Office Weight: 171 lbs 11/17/2023 Office Weight: 175 lbs 06/24/2024 Office Weight: 172 lbs   AT/AF Burden: <1%                                                       Attempted call to wife/patient and unable to reach.    Transmission results reviewed.    Diet:  Does not limit salt intake and uses salt shaker.    Since 12815/2025 ICM Remote Transmission: CorVue thoracic impedance suggesting possible fluid accumulation starting 06/23/2024 and returned to baseline 07/03/2024 and possible return of fluid 07/04/2024.   Prescribed: Potassium 20 mEq take 1 tablet by mouth daily Spironolactone  25 mg take 1 tablet by mouth daily    Labs: 05/19/2024 Creatinine 0.95, BUN 10, Potassium 3.8, Sodium 140, GFR 77.09 11/17/2023 Creatinine 0.97, BUN 19, Potassium 3.9, Sodium 142, GFR 75.45 03/06/2023 Creatinine 0.97, BUN 12, Potassium 4.1, Sodium 143, GFR 81 10/09/2022 Creatinine 0.92, BUN 11, Potassium 4.1, Sodium 139, GFR 81.03 A complete set of results can be found in Results Review.   Recommendations:   Unable to reach.   Will send to Dr Shlomo for review if patient is reached.    Follow-up plan: ICM clinic phone appointment on 07/13/2024 to recheck fluid levels.   91 day device clinic remote transmission 10/04/2024.   EP/Cardiology Office Visits:  Recall 02/15/2024 with Dr Shlomo.  Recall 12/24/2024 with Dr Nancey.     Copy of ICM check sent to Dr. Nancey.      Remote monitoring is medically necessary for Heart Failure Management.    Daily Thoracic Impedance ICM trend: 04/06/2024 through 07/05/2024.    12-14 Month Thoracic Impedance ICM trend:     [name],  RN 07/05/2024 1:29 PM

## 2024-07-06 LAB — CUP PACEART REMOTE DEVICE CHECK
Battery Remaining Longevity: 28 mo
Battery Remaining Percentage: 37 %
Battery Voltage: 2.9 V
Brady Statistic AP VP Percent: 90 %
Brady Statistic AP VS Percent: 1 %
Brady Statistic AS VP Percent: 8.6 %
Brady Statistic AS VS Percent: 1 %
Brady Statistic RA Percent Paced: 90 %
Date Time Interrogation Session: 20251222020054
HighPow Impedance: 59 Ohm
Implantable Lead Connection Status: 753985
Implantable Lead Connection Status: 753985
Implantable Lead Connection Status: 753985
Implantable Lead Implant Date: 20201117
Implantable Lead Implant Date: 20201117
Implantable Lead Implant Date: 20201117
Implantable Lead Location: 753858
Implantable Lead Location: 753859
Implantable Lead Location: 753860
Implantable Pulse Generator Implant Date: 20201117
Lead Channel Impedance Value: 400 Ohm
Lead Channel Impedance Value: 440 Ohm
Lead Channel Impedance Value: 730 Ohm
Lead Channel Pacing Threshold Amplitude: 0.625 V
Lead Channel Pacing Threshold Amplitude: 0.875 V
Lead Channel Pacing Threshold Amplitude: 1.75 V
Lead Channel Pacing Threshold Pulse Width: 0.5 ms
Lead Channel Pacing Threshold Pulse Width: 0.5 ms
Lead Channel Pacing Threshold Pulse Width: 0.5 ms
Lead Channel Sensing Intrinsic Amplitude: 1.6 mV
Lead Channel Sensing Intrinsic Amplitude: 11.8 mV
Lead Channel Setting Pacing Amplitude: 1.375
Lead Channel Setting Pacing Amplitude: 1.625
Lead Channel Setting Pacing Amplitude: 3.25 V
Lead Channel Setting Pacing Pulse Width: 0.5 ms
Lead Channel Setting Pacing Pulse Width: 0.5 ms
Lead Channel Setting Sensing Sensitivity: 0.5 mV
Pulse Gen Serial Number: 111012743
Zone Setting Status: 755011

## 2024-07-07 NOTE — Progress Notes (Signed)
 Remote ICD Transmission

## 2024-07-13 ENCOUNTER — Ambulatory Visit: Attending: Cardiovascular Disease

## 2024-07-13 DIAGNOSIS — I5042 Chronic combined systolic (congestive) and diastolic (congestive) heart failure: Secondary | ICD-10-CM

## 2024-07-13 DIAGNOSIS — Z95810 Presence of automatic (implantable) cardiac defibrillator: Secondary | ICD-10-CM

## 2024-07-13 NOTE — Progress Notes (Signed)
 EPIC Encounter for ICM Monitoring  Patient Name: Anthony Daugherty is a 78 y.o. male Date: 07/13/2024 Primary Care Physican: Candise Aleene DEL, MD Primary Cardiologist: Shlomo Electrophysiologist: [name] Pacing: 99%         11/14/2021 Office Weight: 160 lbs 04/17/2023 Weight: 171 lbs 07/28/2023 Weight: 171 lbs 08/19/2023 Office Weight: 171 lbs 11/17/2023 Office Weight: 175 lbs 06/24/2024 Office Weight: 172 lbs   AT/AF Burden: <1%                                                       Transmission results reviewed.    Diet:  Does not limit salt intake and uses salt shaker.    Since 07/05/2024 ICM Remote Transmission: CorVue thoracic impedance suggesting fluid levels returned to normal 07/06/2024.   Prescribed: Potassium 20 mEq take 1 tablet by mouth daily Spironolactone  25 mg take 1 tablet by mouth daily    Labs: 05/19/2024 Creatinine 0.95, BUN 10, Potassium 3.8, Sodium 140, GFR 77.09 11/17/2023 Creatinine 0.97, BUN 19, Potassium 3.9, Sodium 142, GFR 75.45 03/06/2023 Creatinine 0.97, BUN 12, Potassium 4.1, Sodium 143, GFR 81 10/09/2022 Creatinine 0.92, BUN 11, Potassium 4.1, Sodium 139, GFR 81.03 A complete set of results can be found in Results Review.   Recommendations: No changes.    Follow-up plan: ICM clinic phone appointment on 08/09/2024.   91 day device clinic remote transmission 10/04/2024.   EP/Cardiology Office Visits:  Recall 02/15/2024 with Dr Shlomo.  Recall 12/24/2024 with Dr Nancey.     Copy of ICM check sent to Dr. Nancey.       Remote monitoring is medically necessary for Heart Failure Management.    Daily Thoracic Impedance ICM trend: 04/14/2024 through 07/13/2024.    12-14 Month Thoracic Impedance ICM trend:     [name], RN 07/13/2024 7:09 AM

## 2024-07-14 ENCOUNTER — Ambulatory Visit: Payer: Self-pay | Admitting: Cardiovascular Disease

## 2024-08-09 ENCOUNTER — Ambulatory Visit

## 2024-08-12 NOTE — Progress Notes (Signed)
 31 day ICM Remote transmission canceled due to [facility] clinic is on hold until further notice.  91 day remote monitoring will continue per protocol.

## 2024-11-17 ENCOUNTER — Ambulatory Visit: Admitting: Family Medicine

## 2024-12-28 ENCOUNTER — Ambulatory Visit: Payer: Self-pay | Admitting: Neurology
